# Patient Record
Sex: Female | Born: 1965
Health system: Southern US, Community
[De-identification: ages and names within clinical notes are randomized; demographics above are authoritative.]

## PROBLEM LIST (undated history)

## (undated) DIAGNOSIS — Z923 Personal history of irradiation: Secondary | ICD-10-CM

## (undated) DIAGNOSIS — N63 Unspecified lump in unspecified breast: Secondary | ICD-10-CM

## (undated) DIAGNOSIS — N390 Urinary tract infection, site not specified: Secondary | ICD-10-CM

## (undated) DIAGNOSIS — R519 Headache, unspecified: Secondary | ICD-10-CM

## (undated) DIAGNOSIS — R51 Headache: Secondary | ICD-10-CM

## (undated) DIAGNOSIS — D259 Leiomyoma of uterus, unspecified: Secondary | ICD-10-CM

## (undated) DIAGNOSIS — G8929 Other chronic pain: Secondary | ICD-10-CM

## (undated) DIAGNOSIS — Q631 Lobulated, fused and horseshoe kidney: Secondary | ICD-10-CM

## (undated) HISTORY — DX: Malignant neoplasm of upper-inner quadrant of left female breast: C50.212

## (undated) HISTORY — PX: COLONOSCOPY: SHX174

## (undated) HISTORY — PX: APPENDECTOMY: SHX54

## (undated) HISTORY — DX: Estrogen receptor positive status (ER+): Z17.0

---

## 2003-11-14 HISTORY — PX: APPENDECTOMY: SHX54

## 2013-08-02 ENCOUNTER — Ambulatory Visit (INDEPENDENT_AMBULATORY_CARE_PROVIDER_SITE_OTHER): Payer: Managed Care, Other (non HMO) | Admitting: Family Medicine

## 2013-08-02 VITALS — BP 92/58 | HR 77 | Temp 97.9°F | Resp 16 | Ht 63.0 in | Wt 130.0 lb

## 2013-08-02 DIAGNOSIS — R300 Dysuria: Secondary | ICD-10-CM

## 2013-08-02 DIAGNOSIS — R339 Retention of urine, unspecified: Secondary | ICD-10-CM

## 2013-08-02 LAB — POCT UA - MICROSCOPIC ONLY
Bacteria, U Microscopic: NEGATIVE
Casts, Ur, LPF, POC: NEGATIVE
Crystals, Ur, HPF, POC: NEGATIVE
Mucus, UA: NEGATIVE
RBC, urine, microscopic: NEGATIVE
Yeast, UA: NEGATIVE

## 2013-08-02 LAB — POCT URINALYSIS DIPSTICK
Bilirubin, UA: NEGATIVE
Glucose, UA: NEGATIVE
Ketones, UA: NEGATIVE
Leukocytes, UA: NEGATIVE
Nitrite, UA: NEGATIVE
Protein, UA: NEGATIVE
Spec Grav, UA: 1.02
Urobilinogen, UA: 0.2
pH, UA: 5.5

## 2013-08-02 LAB — POCT URINE PREGNANCY: Preg Test, Ur: NEGATIVE

## 2013-08-02 MED ORDER — CIPROFLOXACIN HCL 500 MG PO TABS: 500.0000 mg | ORAL_TABLET | Freq: Two times a day (BID) | ORAL | Status: DC

## 2013-08-02 NOTE — Progress Notes (Signed)
Patient ID: Kristina Nicholson MRN: [id number], DOB: [birth year], 47 y.o. Date of Encounter: 08/02/2013, 9:57 AM  Primary Physician: No PCP Per Patient  Chief Complaint:  Chief Complaint  Patient presents with  . Urinary Tract Infection    about 1 week- urinary retention    HPI: 47 y.o. year old female presents with 5 day history of dysuria, urgency, and frequency. Last UTI was when she was young. No hematuria LMP:  First week this month No sick contacts, recent antibiotics, or recent travels.   No vaginal discharge, back pain, fever  No past medical history on file.   Home Meds: Prior to Admission medications   Not on File    Allergies: No Known Allergies  History   Social History  . Marital Status: Married    Spouse Name: N/A    Number of Children: N/A  . Years of Education: N/A   Occupational History  . Not on file.   Social History Main Topics  . Smoking status: Current Every Day Smoker  . Smokeless tobacco: Not on file  . Alcohol Use: No  . Drug Use: No  . Sexual Activity: Not on file   Other Topics Concern  . Not on file   Social History Narrative  . No narrative on file     Review of Systems: Constitutional: negative for chills, fever, night sweats or weight changes Cardiovascular: negative for chest pain or palpitations Respiratory: negative for hemoptysis, wheezing, or shortness of breath Abdominal: negative for abdominal pain, nausea, vomiting or diarrhea Dermatological: negative for rash Neurologic: negative for headache   Physical Exam: Blood pressure 92/58, pulse 77, temperature 97.9 F (36.6 C), temperature source Oral, resp. rate 16, height 5\' 3"  (1.6 m), weight 130 lb (58.968 kg), SpO2 99.00%., Body mass index is 23.03 kg/(m^2). General: Well developed, well nourished, in no acute distress. Head: Normocephalic, atraumatic, eyes without discharge, sclera non-icteric, nares are congested. Bilateral auditory canals clear, TM's are  without perforation, pearly grey with reflective cone of light bilaterally. Serous effusion bilaterally behind TM's. Maxillary sinus TTP. Oral cavity moist, dentition normal. Posterior pharynx with post nasal drip and mild erythema. No peritonsillar abscess or tonsillar exudate. Neck: Supple. No thyromegaly. Full ROM. No lymphadenopathy. Lungs: Coarse breath sounds bilaterally without Clear bilaterally to auscultation without wheezes, rales, or rhonchi. Breathing is unlabored.  Heart: RRR with S1 S2. No murmurs, rubs, or gallops appreciated. Abdomen: Soft, non-distended with normoactive bowel sounds. No hepatosplenomegaly. No rebound/guarding. No obvious abdominal masses.  Tender LLQ with deep palpation.   McBurney's, Rovsing's, Iliopsoas, and table jar all negative. Msk:  Strength and tone normal for age. Extremities: No clubbing or cyanosis. No edema. Neuro: Alert and oriented X 3. Moves all extremities spontaneously. CNII-XII grossly in tact. Psych:  Responds to questions appropriately with a normal affect.   Labs: Results for orders placed in visit on 08/02/13  POCT UA - MICROSCOPIC ONLY      Result Value Range   WBC, Ur, HPF, POC 0-2     RBC, urine, microscopic neg     Bacteria, U Microscopic neg     Mucus, UA neg     Epithelial cells, urine per micros 2-8     Crystals, Ur, HPF, POC neg     Casts, Ur, LPF, POC neg     Yeast, UA neg    POCT URINALYSIS DIPSTICK      Result Value Range   Color, UA yellow     Clarity, UA clear  Glucose, UA neg     Bilirubin, UA neg     Ketones, UA neg     Spec Grav, UA 1.020     Blood, UA trace     pH, UA 5.5     Protein, UA neg     Urobilinogen, UA 0.2     Nitrite, UA neg     Leukocytes, UA Negative    POCT URINE PREGNANCY      Result Value Range   Preg Test, Ur Negative         ASSESSMENT AND PLAN:  47 y.o. year old female with  - -Mucinex -Tylenol/Motrin prn -Rest/fluids -RTC precautions -RTC 3-5 days if no  improvement  Signed, Elvina Sidle, MD 08/02/2013 9:57 AM

## 2013-08-03 LAB — URINE CULTURE: Colony Count: 1000

## 2013-10-11 ENCOUNTER — Ambulatory Visit (INDEPENDENT_AMBULATORY_CARE_PROVIDER_SITE_OTHER): Payer: Managed Care, Other (non HMO) | Admitting: Family Medicine

## 2013-10-11 VITALS — BP 110/66 | HR 70 | Temp 98.6°F | Resp 18 | Ht 63.0 in | Wt 129.0 lb

## 2013-10-11 DIAGNOSIS — N925 Other specified irregular menstruation: Secondary | ICD-10-CM

## 2013-10-11 DIAGNOSIS — N938 Other specified abnormal uterine and vaginal bleeding: Secondary | ICD-10-CM

## 2013-10-11 DIAGNOSIS — N949 Unspecified condition associated with female genital organs and menstrual cycle: Secondary | ICD-10-CM

## 2013-10-11 DIAGNOSIS — N926 Irregular menstruation, unspecified: Secondary | ICD-10-CM

## 2013-10-11 DIAGNOSIS — N920 Excessive and frequent menstruation with regular cycle: Secondary | ICD-10-CM

## 2013-10-11 LAB — POCT CBC
Granulocyte percent: 44.3 %G (ref 37–80)
HCT, POC: 43.5 % (ref 37.7–47.9)
Hemoglobin: 13.4 g/dL (ref 12.2–16.2)
MCHC: 30.8 g/dL — AB (ref 31.8–35.4)
POC Granulocyte: 2.1 (ref 2–6.9)
POC LYMPH PERCENT: 50.4 %L — AB (ref 10–50)
POC MID %: 5.3 %M (ref 0–12)
RDW, POC: 13.6 %

## 2013-10-11 LAB — POCT URINE PREGNANCY: Preg Test, Ur: NEGATIVE

## 2013-10-11 MED ORDER — MEDROXYPROGESTERONE ACETATE 10 MG PO TABS: 10.0000 mg | ORAL_TABLET | Freq: Every day | ORAL | Status: DC

## 2013-10-11 NOTE — Addendum Note (Signed)
Addended by: Vira Agar on: 10/11/2013 03:53 PM   Modules accepted: Orders

## 2013-10-11 NOTE — Progress Notes (Addendum)
Subjective: Patient has been having vaginal bleeding for about a month. She's had normal periods prior to that. This is light to moderate to heavy at times. No major pain. She has not had problems with her periods in the past. She had a normal period, stop for 2 days, then this prolonged bleeding started.  Objective: No CVA tenderness. Abdomen soft without mass or tenderness. Normal external genitalia. Vaginal because unremarkable. Cervix benign. Bimanual exam feels no adnexal or uterine masses or tenderness. There is old blood in the vaginal vault and little blood coming from the os.  Assessment: Menorrhagia and prolonged menstrual bleeding  Plan: CBC, urine hCG Treat with Provera for 5 days. If symptoms continue to persist we'll need to send to see a GYN and consider a ultrasound.

## 2013-10-11 NOTE — Addendum Note (Signed)
Addended by: Johnnette Litter on: 10/11/2013 03:55 PM   Modules accepted: Orders

## 2013-10-11 NOTE — Patient Instructions (Signed)
Take the progesterone (Provera) 10 mg one daily for 5 days.  I hope this stops your bleeding. At the end of the course of Provera you will probably have a normal but maybe heavy menstrual periods. After that you should cease bleeding and go back into your regular monthly cycling. If bleeding gets extremely heavy or continues to persist you should be rechecked and further evaluation and referral discussed. He might need an ultrasound and a referral to a gynecologist

## 2014-06-23 ENCOUNTER — Ambulatory Visit (INDEPENDENT_AMBULATORY_CARE_PROVIDER_SITE_OTHER): Payer: BC Managed Care – PPO | Admitting: Internal Medicine

## 2014-06-23 VITALS — BP 120/78 | HR 59 | Temp 97.7°F | Resp 18 | Ht 63.0 in | Wt 127.4 lb

## 2014-06-23 DIAGNOSIS — H60399 Other infective otitis externa, unspecified ear: Secondary | ICD-10-CM

## 2014-06-23 DIAGNOSIS — R109 Unspecified abdominal pain: Secondary | ICD-10-CM

## 2014-06-23 DIAGNOSIS — Q638 Other specified congenital malformations of kidney: Secondary | ICD-10-CM

## 2014-06-23 DIAGNOSIS — Q631 Lobulated, fused and horseshoe kidney: Secondary | ICD-10-CM

## 2014-06-23 LAB — POCT URINALYSIS DIPSTICK
Bilirubin, UA: NEGATIVE
Glucose, UA: NEGATIVE
Nitrite, UA: NEGATIVE
Protein, UA: NEGATIVE
Spec Grav, UA: 1.025
Urobilinogen, UA: 0.2
pH, UA: 5.5

## 2014-06-23 LAB — POCT UA - MICROSCOPIC ONLY
CRYSTALS, UR, HPF, POC: NEGATIVE
Casts, Ur, LPF, POC: NEGATIVE
Yeast, UA: NEGATIVE

## 2014-06-23 MED ORDER — NEOMYCIN-POLYMYXIN-HC 3.5-10000-1 OT SUSP: 3.0000 [drp] | Freq: Three times a day (TID) | OTIC | Status: DC

## 2014-06-23 MED ORDER — CIPROFLOXACIN HCL 500 MG PO TABS: 500.0000 mg | ORAL_TABLET | Freq: Two times a day (BID) | ORAL | Status: DC

## 2014-06-23 MED ORDER — MELOXICAM 15 MG PO TABS: 15.0000 mg | ORAL_TABLET | Freq: Every day | ORAL | Status: DC

## 2014-06-23 NOTE — Progress Notes (Signed)
Subjective:    Patient ID: Kristina Nicholson, female    DOB: 05/10/1966, 48 y.o.   MRN: [id number] This chart was scribed for Tami Lin, MD by Randa Evens, ED Scribe. This Patient was seen in room 04 and the patients care was started at 5:55 PM  Flank Pain Associated symptoms include dysuria. Pertinent negatives include no fever.  Otalgia  Pertinent negatives include no vomiting.   HPI Comments: Kristina Nicholson is a 48 y.o. female who presents to the [facility] complaining of intermittent left sided flank pain onset 3 months. States she has associated decreased urine and dysuria.  State she was in [place] during the winter when these problems began and had an CT scan revealing a horseshoe kidney with swelling. States her kidney was filled with fluid. She was treated with [place] herbal preparations and responded but read as medicine about 3 months ago after returning home. Denies fever, vomiting or nausea. Has urinary frequency with slight discomfort suprapubic postvoid.  Also complaining of left sided ear pain onset 3 days. She states that her ear feels dryer than normal. No change in hearing  There are no active problems to display for this patient.  Prior to Admission medications   Not on File    Review of Systems  Constitutional: Negative for fever.  HENT: Positive for ear pain.   Gastrointestinal: Negative for nausea and vomiting.  Genitourinary: Positive for dysuria, flank pain and decreased urine volume.    Objective:    Physical Exam  Nursing note and vitals reviewed. Constitutional: She is oriented to person, place, and time. She appears well-developed and well-nourished. No distress.  HENT:  Head: Normocephalic and atraumatic.  Left ear auditory canal has dried flaky skin throughout, right auditory canal appears in similar fashion  Eyes: Conjunctivae and EOM are normal.  Neck: Neck supple. No tracheal deviation present.  Cardiovascular:  Normal rate.   Pulmonary/Chest: Effort normal. No respiratory distress.  Abdominal:  mildly tender on let side in poster axillary line without obvious mass, no flank tenderness to percussion, rest of abdomen is benign   Musculoskeletal: Normal range of motion.  Neurological: She is alert and oriented to person, place, and time.  Skin: Skin is warm and dry.  Psychiatric: She has a normal mood and affect. Her behavior is normal.   Results for orders placed in visit on 06/23/14  POCT URINALYSIS DIPSTICK      Result Value Ref Range   Color, UA yellow     Clarity, UA hazy     Glucose, UA neg     Bilirubin, UA neg     Ketones, UA trace     Spec Grav, UA 1.025     Blood, UA trace     pH, UA 5.5     Protein, UA neg     Urobilinogen, UA 0.2     Nitrite, UA neg     Leukocytes, UA Trace    POCT UA - MICROSCOPIC ONLY      Result Value Ref Range   WBC, Ur, HPF, POC 4-6     RBC, urine, microscopic 1-3     Bacteria, U Microscopic trace     Mucus, UA trace     Epithelial cells, urine per micros 4-6     Crystals, Ur, HPF, POC neg     Casts, Ur, LPF, POC neg     Yeast, UA neg      Assessment & Plan:  Flank pain ?  UTI Hydronephrosis likely-she needs followup with urology/ reimaging Otitis externa bilaterally  Meds ordered this encounter  Medications  . ciprofloxacin (CIPRO) 500 MG tablet    Sig: Take 1 tablet (500 mg total) by mouth 2 (two) times daily.    Dispense:  20 tablet    Refill:  0  . neomycin-polymyxin-hydrocortisone (CORTISPORIN) 3.5-10000-1 otic suspension    Sig: Place 3 drops into both ears 3 (three) times daily.    Dispense:  10 mL    Refill:  0  . meloxicam (MOBIC) 15 MG tablet    Sig: Take 1 tablet (15 mg total) by mouth daily. For pain    Dispense:  30 tablet    Refill:  0     I have completed the patient encounter in its entirety as documented by the scribe, with editing by me where necessary. Robert P. Laney Pastor, M.D.

## 2014-06-25 LAB — URINE CULTURE: Colony Count: 50000

## 2014-06-25 NOTE — Progress Notes (Signed)
Left a message for patient to return call to schedule an appointment in 3 to 4 months for a CPE with a female provider.

## 2014-06-26 NOTE — Progress Notes (Signed)
Left a message for patient to return call to schedule an appointment for a cpe

## 2014-07-03 NOTE — Progress Notes (Signed)
Left a message for patient to return call for a physical.

## 2014-07-06 ENCOUNTER — Telehealth: Payer: Self-pay

## 2014-07-06 ENCOUNTER — Encounter: Payer: Self-pay | Admitting: Family Medicine

## 2014-07-06 NOTE — Telephone Encounter (Signed)
Patient is returning call to [facility]  °

## 2014-07-06 NOTE — Progress Notes (Signed)
Sent an unable to reach letter.

## 2014-07-07 NOTE — Telephone Encounter (Signed)
No documentation for call to pt.

## 2014-07-07 NOTE — Telephone Encounter (Signed)
Patient needs an appointment letter was sent, tried again, unable to reach

## 2014-07-22 ENCOUNTER — Other Ambulatory Visit (HOSPITAL_COMMUNITY): Payer: Self-pay | Admitting: Urology

## 2014-07-22 DIAGNOSIS — R1900 Intra-abdominal and pelvic swelling, mass and lump, unspecified site: Secondary | ICD-10-CM

## 2014-07-31 ENCOUNTER — Ambulatory Visit (HOSPITAL_COMMUNITY)
Admission: RE | Admit: 2014-07-31 | Discharge: 2014-07-31 | Disposition: A | Discharge status: Home / Self Care | Payer: BC Managed Care – PPO | Source: Ambulatory Visit | Attending: Urology | Admitting: Urology

## 2014-07-31 DIAGNOSIS — R1909 Other intra-abdominal and pelvic swelling, mass and lump: Secondary | ICD-10-CM | POA: Diagnosis present

## 2014-07-31 DIAGNOSIS — D259 Leiomyoma of uterus, unspecified: Secondary | ICD-10-CM | POA: Insufficient documentation

## 2014-07-31 DIAGNOSIS — R1900 Intra-abdominal and pelvic swelling, mass and lump, unspecified site: Secondary | ICD-10-CM

## 2014-07-31 MED ORDER — GADOBENATE DIMEGLUMINE 529 MG/ML IV SOLN
15.0000 mL | Freq: Once | INTRAVENOUS | Status: AC | PRN
  Administered 2014-07-31: 12 mL via INTRAVENOUS

## 2014-11-13 HISTORY — PX: COLONOSCOPY: SHX174

## 2015-03-08 ENCOUNTER — Other Ambulatory Visit: Payer: Self-pay | Admitting: Obstetrics & Gynecology

## 2015-03-08 DIAGNOSIS — R928 Other abnormal and inconclusive findings on diagnostic imaging of breast: Secondary | ICD-10-CM

## 2015-07-26 ENCOUNTER — Ambulatory Visit (INDEPENDENT_AMBULATORY_CARE_PROVIDER_SITE_OTHER): Payer: BLUE CROSS/BLUE SHIELD | Admitting: Urgent Care

## 2015-07-26 VITALS — BP 110/60 | HR 60 | Temp 98.1°F | Resp 16 | Ht 63.0 in | Wt 126.0 lb

## 2015-07-26 DIAGNOSIS — R519 Headache, unspecified: Secondary | ICD-10-CM

## 2015-07-26 DIAGNOSIS — R5383 Other fatigue: Secondary | ICD-10-CM | POA: Diagnosis not present

## 2015-07-26 DIAGNOSIS — R42 Dizziness and giddiness: Secondary | ICD-10-CM

## 2015-07-26 DIAGNOSIS — R109 Unspecified abdominal pain: Secondary | ICD-10-CM | POA: Diagnosis not present

## 2015-07-26 DIAGNOSIS — R110 Nausea: Secondary | ICD-10-CM

## 2015-07-26 DIAGNOSIS — R51 Headache: Secondary | ICD-10-CM | POA: Diagnosis not present

## 2015-07-26 LAB — COMPREHENSIVE METABOLIC PANEL
ALBUMIN: 3.7 g/dL (ref 3.6–5.1)
ALT: 8 U/L (ref 6–29)
AST: 12 U/L (ref 10–35)
Alkaline Phosphatase: 45 U/L (ref 33–130)
BILIRUBIN TOTAL: 0.5 mg/dL (ref 0.2–1.2)
BUN: 13 mg/dL (ref 7–25)
CO2: 26 mmol/L (ref 20–31)
CREATININE: 0.65 mg/dL (ref 0.50–1.05)
Calcium: 8.8 mg/dL (ref 8.6–10.4)
Chloride: 104 mmol/L (ref 98–110)
GLUCOSE: 94 mg/dL (ref 65–99)
Potassium: 3.9 mmol/L (ref 3.5–5.3)
SODIUM: 139 mmol/L (ref 135–146)
Total Protein: 6.2 g/dL (ref 6.1–8.1)

## 2015-07-26 LAB — POCT UA - MICROSCOPIC ONLY
CASTS, UR, LPF, POC: NEGATIVE
Crystals, Ur, HPF, POC: NEGATIVE
Mucus, UA: NEGATIVE
WBC, UR, HPF, POC: NEGATIVE
Yeast, UA: NEGATIVE

## 2015-07-26 LAB — POCT CBC
Granulocyte percent: 62.6 %G (ref 37–80)
HCT, POC: 36.4 % — AB (ref 37.7–47.9)
HEMOGLOBIN: 11.7 g/dL — AB (ref 12.2–16.2)
Lymph, poc: 2 (ref 0.6–3.4)
MCH: 28.9 pg (ref 27–31.2)
MCHC: 32.1 g/dL (ref 31.8–35.4)
MCV: 90 fL (ref 80–97)
MID (cbc): 0.5 (ref 0–0.9)
MPV: 7.1 fL (ref 0–99.8)
PLATELET COUNT, POC: 254 10*3/uL (ref 142–424)
POC Granulocyte: 4.2 (ref 2–6.9)
POC LYMPH PERCENT: 30.2 %L (ref 10–50)
POC MID %: 7.2 % (ref 0–12)
RBC: 4.04 M/uL (ref 4.04–5.48)
RDW, POC: 13.7 %
WBC: 6.7 10*3/uL (ref 4.6–10.2)

## 2015-07-26 LAB — POCT URINALYSIS DIPSTICK
Bilirubin, UA: NEGATIVE
GLUCOSE UA: NEGATIVE
Ketones, UA: NEGATIVE
Leukocytes, UA: NEGATIVE
NITRITE UA: NEGATIVE
Protein, UA: NEGATIVE
Spec Grav, UA: 1.015
UROBILINOGEN UA: 0.2
pH, UA: 5.5

## 2015-07-26 LAB — TSH: TSH: 0.871 u[IU]/mL (ref 0.350–4.500)

## 2015-07-26 NOTE — Progress Notes (Signed)
MRN: [id number] DOB: [birth year]  Subjective:   Kristina Nicholson is a 49 y.o. female presenting for chief complaint of Cough; Fatigue; and Dizziness  Reports 4 day history of fatigue, dizziness, intermittent dull headache, nausea. She has also had a 2 week history of cough which has improved but is still bothering her. Has tried NyQuil and Theraflu with relief for her cough. Denies fever, vomiting, abdominal pain, confusion, heart racing, palpitations, chest pain, shob, wheezing, sore throat, dysuria, hematuria, diarrhea. Denies the worst headache of her life. Of note, patient has a complicated urinary history, was seen by urology and gynecology, diagnosed with "horseshoe kidney". She has not been evaluated by a nephrologist. Patient works as a Programme researcher, broadcasting/film/video, she does not hydrate at all when she works her 8 hour shifts 3 days a week. Husband admits that she does not hydrate well in general. Denies any other aggravating or relieving factors, no other questions or concerns.  Kristina Nicholson currently has no medications in their medication list. Also has No Known Allergies.  Kristina Nicholson  has no past medical history on file. Also  has no past surgical history on file.  Objective:   Vitals: BP 110/60 mmHg  Pulse 60  Temp(Src) 98.1 F (36.7 C) (Oral)  Resp 16  Ht 5\' 3"  (1.6 m)  Wt 126 lb (57.153 kg)  BMI 22.33 kg/m2  SpO2 98%  LMP 06/22/2015  Physical Exam  Constitutional: She is oriented to person, place, and time. She appears well-developed and well-nourished.  HENT:  Mouth/Throat: Oropharynx is clear and moist.  TM's intact. Nasal passages patent. No sinus tenderness.  Eyes: No scleral icterus.  Neck: Normal range of motion. Neck supple.  Cardiovascular: Normal rate, regular rhythm and intact distal pulses.  Exam reveals no gallop and no friction rub.   No murmur heard. Pulmonary/Chest: No respiratory distress. She has no wheezes. She has no rales.  Abdominal: Soft. Bowel sounds are normal. She  exhibits no distension and no mass. There is no tenderness.  No CVA tenderness.  Lymphadenopathy:    She has no cervical adenopathy.  Neurological: She is alert and oriented to person, place, and time.  Skin: Skin is warm and dry. No rash noted. No erythema. No pallor.   Results for orders placed or performed in visit on 07/26/15 (from the past 24 hour(s))  POCT CBC     Status: Abnormal   Collection Time: 07/26/15  5:21 PM  Result Value Ref Range   WBC 6.7 4.6 - 10.2 K/uL   Lymph, poc 2.0 0.6 - 3.4   POC LYMPH PERCENT 30.2 10 - 50 %L   MID (cbc) 0.5 0 - 0.9   POC MID % 7.2 0 - 12 %M   POC Granulocyte 4.2 2 - 6.9   Granulocyte percent 62.6 37 - 80 %G   RBC 4.04 4.04 - 5.48 M/uL   Hemoglobin 11.7 (A) 12.2 - 16.2 g/dL   HCT, POC 36.4 (A) 37.7 - 47.9 %   MCV 90.0 80 - 97 fL   MCH, POC 28.9 27 - 31.2 pg   MCHC 32.1 31.8 - 35.4 g/dL   RDW, POC 13.7 %   Platelet Count, POC 254 142 - 424 K/uL   MPV 7.1 0 - 99.8 fL  POCT UA - Microscopic Only     Status: None   Collection Time: 07/26/15  5:21 PM  Result Value Ref Range   WBC, Ur, HPF, POC neg    RBC, urine, microscopic 0-1  Bacteria, U Microscopic trace    Mucus, UA neg    Epithelial cells, urine per micros 2-6    Crystals, Ur, HPF, POC neg    Casts, Ur, LPF, POC neg    Yeast, UA neg   POCT urinalysis dipstick     Status: None   Collection Time: 07/26/15  5:21 PM  Result Value Ref Range   Color, UA yellow    Clarity, UA clear    Glucose, UA neg    Bilirubin, UA neg    Ketones, UA neg    Spec Grav, UA 1.015    Blood, UA tr-lysed    pH, UA 5.5    Protein, UA neg    Urobilinogen, UA 0.2    Nitrite, UA neg    Leukocytes, UA Negative Negative   Assessment and Plan :   1. Dizziness 2. Nonintractable headache, unspecified chronicity pattern, unspecified headache type 3. Other fatigue 4. Nausea without vomiting 5. Left flank pain - POC labs and physical exam findings are reassuring. Recommended patient start hydrating  well especially during her long shifts at work, take the next day off for rest. Recommended supportive care otherwise. - I will follow up with her husband by phone, labs pending. - I did recommend Antivert for dizziness and nausea as needed.  Jaynee Eagles, PA-C [facility] [phone] 07/26/2015 4:48 PM

## 2015-07-26 NOTE — Patient Instructions (Signed)
- Hydration is very important, drink at least 64 ounces daily. This important especially when you work.  - If you start to get fever, abdominal pain, your headache worsens, you need to return to our clinic for re-evaluation.   Dizziness Dizziness is a common problem. It is a feeling of unsteadiness or light-headedness. You may feel like you are about to faint. Dizziness can lead to injury if you stumble or fall. A person of any age group can suffer from dizziness, but dizziness is more common in older adults. CAUSES  Dizziness can be caused by many different things, including:  Middle ear problems.  Standing for too long.  Infections.  An allergic reaction.  Aging.  An emotional response to something, such as the sight of blood.  Side effects of medicines.  Tiredness.  Problems with circulation or blood pressure.  Excessive use of alcohol or medicines, or illegal drug use.  Breathing too fast (hyperventilation).  An irregular heart rhythm (arrhythmia).  A low red blood cell count (anemia).  Pregnancy.  Vomiting, diarrhea, fever, or other illnesses that cause body fluid loss (dehydration).  Diseases or conditions such as Parkinson's disease, high blood pressure (hypertension), diabetes, and thyroid problems.  Exposure to extreme heat. DIAGNOSIS  Your health care provider will ask about your symptoms, perform a physical exam, and perform an electrocardiogram (ECG) to record the electrical activity of your heart. Your health care provider may also perform other heart or blood tests to determine the cause of your dizziness. These may include:  Transthoracic echocardiogram (TTE). During echocardiography, sound waves are used to evaluate how blood flows through your heart.  Transesophageal echocardiogram (TEE).  Cardiac monitoring. This allows your health care provider to monitor your heart rate and rhythm in real time.  Holter monitor. This is a portable device that  records your heartbeat and can help diagnose heart arrhythmias. It allows your health care provider to track your heart activity for several days if needed.  Stress tests by exercise or by giving medicine that makes the heart beat faster. TREATMENT  Treatment of dizziness depends on the cause of your symptoms and can vary greatly. HOME CARE INSTRUCTIONS   Drink enough fluids to keep your urine clear or pale yellow. This is especially important in very hot weather. In older adults, it is also important in cold weather.  Take your medicine exactly as directed if your dizziness is caused by medicines. When taking blood pressure medicines, it is especially important to get up slowly.  Rise slowly from chairs and steady yourself until you feel okay.  In the morning, first sit up on the side of the bed. When you feel okay, stand slowly while holding onto something until you know your balance is fine.  Move your legs often if you need to stand in one place for a long time. Tighten and relax your muscles in your legs while standing.  Have someone stay with you for 1-2 days if dizziness continues to be a problem. Do this until you feel you are well enough to stay alone. Have the person call your health care provider if he or she notices changes in you that are concerning.  Do not drive or use heavy machinery if you feel dizzy.  Do not drink alcohol. SEEK IMMEDIATE MEDICAL CARE IF:   Your dizziness or light-headedness gets worse.  You feel nauseous or vomit.  You have problems talking, walking, or using your arms, hands, or legs.  You feel weak.  You are not thinking clearly or you have trouble forming sentences. It may take a friend or family member to notice this.  You have chest pain, abdominal pain, shortness of breath, or sweating.  Your vision changes.  You notice any bleeding.  You have side effects from medicine that seems to be getting worse rather than better. MAKE SURE YOU:     Understand these instructions.  Will watch your condition.  Will get help right away if you are not doing well or get worse. Document Released: 04/25/2001 Document Revised: 11/04/2013 Document Reviewed: 05/19/2011 [facility] Patient Information 2015 [place], [place]. This information is not intended to replace advice given to you by your health care provider. Make sure you discuss any questions you have with your health care provider.

## 2015-07-27 ENCOUNTER — Telehealth: Payer: Self-pay | Admitting: Urgent Care

## 2015-07-27 NOTE — Telephone Encounter (Signed)
Reported CMet was normal including electrolytes, blood sugar, liver enzymes and kidney function. Also had normal TSH. Recommended following plan as discussed in clinic. RTC in 2 days if no improvement.

## 2016-11-13 HISTORY — PX: MASTECTOMY: SHX3

## 2017-04-04 DIAGNOSIS — N6320 Unspecified lump in the left breast, unspecified quadrant: Secondary | ICD-10-CM | POA: Diagnosis not present

## 2017-04-05 ENCOUNTER — Other Ambulatory Visit: Payer: Self-pay | Admitting: Obstetrics & Gynecology

## 2017-04-05 DIAGNOSIS — N6320 Unspecified lump in the left breast, unspecified quadrant: Secondary | ICD-10-CM

## 2017-04-10 ENCOUNTER — Other Ambulatory Visit: Payer: Self-pay | Admitting: Obstetrics & Gynecology

## 2017-04-10 ENCOUNTER — Ambulatory Visit
Admission: RE | Admit: 2017-04-10 | Discharge: 2017-04-10 | Disposition: A | Discharge status: Home / Self Care | Payer: BLUE CROSS/BLUE SHIELD | Source: Ambulatory Visit | Attending: Obstetrics & Gynecology | Admitting: Obstetrics & Gynecology

## 2017-04-10 DIAGNOSIS — N6489 Other specified disorders of breast: Secondary | ICD-10-CM | POA: Diagnosis not present

## 2017-04-10 DIAGNOSIS — N6320 Unspecified lump in the left breast, unspecified quadrant: Secondary | ICD-10-CM

## 2017-04-10 DIAGNOSIS — R928 Other abnormal and inconclusive findings on diagnostic imaging of breast: Secondary | ICD-10-CM | POA: Diagnosis not present

## 2017-04-10 HISTORY — DX: Unspecified lump in unspecified breast: N63.0

## 2017-04-13 ENCOUNTER — Ambulatory Visit
Admission: RE | Admit: 2017-04-13 | Discharge: 2017-04-13 | Disposition: A | Discharge status: Home / Self Care | Payer: BLUE CROSS/BLUE SHIELD | Source: Ambulatory Visit | Attending: Obstetrics & Gynecology | Admitting: Obstetrics & Gynecology

## 2017-04-13 DIAGNOSIS — N6320 Unspecified lump in the left breast, unspecified quadrant: Secondary | ICD-10-CM

## 2017-04-13 DIAGNOSIS — C50212 Malignant neoplasm of upper-inner quadrant of left female breast: Secondary | ICD-10-CM | POA: Diagnosis not present

## 2017-04-18 ENCOUNTER — Telehealth: Payer: Self-pay | Admitting: *Deleted

## 2017-04-18 ENCOUNTER — Encounter: Payer: Self-pay | Admitting: *Deleted

## 2017-04-18 DIAGNOSIS — C50212 Malignant neoplasm of upper-inner quadrant of left female breast: Secondary | ICD-10-CM

## 2017-04-18 DIAGNOSIS — Z170 Estrogen receptor positive status [ER+]: Principal | ICD-10-CM

## 2017-04-18 HISTORY — DX: Malignant neoplasm of upper-inner quadrant of left female breast: C50.212

## 2017-04-18 HISTORY — DX: Estrogen receptor positive status (ER+): Z17.0

## 2017-04-18 NOTE — Telephone Encounter (Signed)
Confirmed [facility] for 04/25/17 at 0815 .  Instructions and contact information given.

## 2017-04-18 NOTE — Telephone Encounter (Signed)
Mailed [facility] packet to pt. 

## 2017-04-18 NOTE — Telephone Encounter (Signed)
Left vm for pt to return call regarding [place] for 6.13.18. Contact information provided.

## 2017-04-25 ENCOUNTER — Encounter: Payer: Self-pay | Admitting: Hematology

## 2017-04-25 ENCOUNTER — Ambulatory Visit: Payer: BLUE CROSS/BLUE SHIELD | Attending: General Surgery | Admitting: Physical Therapy

## 2017-04-25 ENCOUNTER — Ambulatory Visit (HOSPITAL_BASED_OUTPATIENT_CLINIC_OR_DEPARTMENT_OTHER): Payer: BLUE CROSS/BLUE SHIELD | Admitting: Hematology

## 2017-04-25 ENCOUNTER — Ambulatory Visit: Payer: Self-pay | Admitting: General Surgery

## 2017-04-25 ENCOUNTER — Ambulatory Visit
Admission: RE | Admit: 2017-04-25 | Discharge: 2017-04-25 | Disposition: A | Discharge status: Home / Self Care | Payer: BLUE CROSS/BLUE SHIELD | Source: Ambulatory Visit | Attending: Radiation Oncology | Admitting: Radiation Oncology

## 2017-04-25 ENCOUNTER — Encounter: Payer: Self-pay | Admitting: *Deleted

## 2017-04-25 ENCOUNTER — Other Ambulatory Visit (HOSPITAL_BASED_OUTPATIENT_CLINIC_OR_DEPARTMENT_OTHER): Payer: BLUE CROSS/BLUE SHIELD

## 2017-04-25 ENCOUNTER — Encounter: Payer: Self-pay | Admitting: Physical Therapy

## 2017-04-25 VITALS — BP 112/67 | HR 70 | Temp 97.8°F | Resp 18 | Ht 63.0 in | Wt 129.6 lb

## 2017-04-25 DIAGNOSIS — C50212 Malignant neoplasm of upper-inner quadrant of left female breast: Secondary | ICD-10-CM

## 2017-04-25 DIAGNOSIS — Z170 Estrogen receptor positive status [ER+]: Secondary | ICD-10-CM

## 2017-04-25 DIAGNOSIS — Z9012 Acquired absence of left breast and nipple: Secondary | ICD-10-CM | POA: Insufficient documentation

## 2017-04-25 DIAGNOSIS — F17210 Nicotine dependence, cigarettes, uncomplicated: Secondary | ICD-10-CM | POA: Diagnosis not present

## 2017-04-25 DIAGNOSIS — Z716 Tobacco abuse counseling: Secondary | ICD-10-CM | POA: Diagnosis not present

## 2017-04-25 DIAGNOSIS — R300 Dysuria: Secondary | ICD-10-CM

## 2017-04-25 DIAGNOSIS — Z510 Encounter for antineoplastic radiation therapy: Secondary | ICD-10-CM | POA: Insufficient documentation

## 2017-04-25 LAB — COMPREHENSIVE METABOLIC PANEL
ALT: 11 U/L (ref 0–55)
AST: 15 U/L (ref 5–34)
Albumin: 3.9 g/dL (ref 3.5–5.0)
Alkaline Phosphatase: 77 U/L (ref 40–150)
Anion Gap: 11 mEq/L (ref 3–11)
BILIRUBIN TOTAL: 0.45 mg/dL (ref 0.20–1.20)
BUN: 12 mg/dL (ref 7.0–26.0)
CALCIUM: 9.8 mg/dL (ref 8.4–10.4)
CO2: 25 mEq/L (ref 22–29)
CREATININE: 0.8 mg/dL (ref 0.6–1.1)
Chloride: 106 mEq/L (ref 98–109)
EGFR: 85 mL/min/{1.73_m2} — ABNORMAL LOW (ref >90)
Glucose: 118 mg/dl (ref 70–140)
Potassium: 3.6 mEq/L (ref 3.5–5.1)
Sodium: 142 mEq/L (ref 136–145)
TOTAL PROTEIN: 7.4 g/dL (ref 6.4–8.3)

## 2017-04-25 LAB — CBC WITH DIFFERENTIAL/PLATELET
BASO%: 0.5 % (ref 0.0–2.0)
BASOS ABS: 0 10*3/uL (ref 0.0–0.1)
EOS%: 2 % (ref 0.0–7.0)
Eosinophils Absolute: 0.1 10*3/uL (ref 0.0–0.5)
HCT: 40.5 % (ref 34.8–46.6)
HGB: 13.7 g/dL (ref 11.6–15.9)
LYMPH%: 43.2 % (ref 14.0–49.7)
MCH: 30.6 pg (ref 25.1–34.0)
MCHC: 33.9 g/dL (ref 31.5–36.0)
MCV: 90.2 fL (ref 79.5–101.0)
MONO#: 0.4 10*3/uL (ref 0.1–0.9)
MONO%: 8.7 % (ref 0.0–14.0)
NEUT#: 2.3 10*3/uL (ref 1.5–6.5)
NEUT%: 45.6 % (ref 38.4–76.8)
Platelets: 254 10*3/uL (ref 145–400)
RBC: 4.49 10*6/uL (ref 3.70–5.45)
RDW: 13.4 % (ref 11.2–14.5)
WBC: 5 10*3/uL (ref 3.9–10.3)
lymph#: 2.2 10*3/uL (ref 0.9–3.3)

## 2017-04-25 NOTE — Progress Notes (Signed)
Radiation Oncology         [id number]) [phone] ________________________________  Initial outpatient Consultation  Name: Kristina Nicholson MRN: [id number]  Date: 04/25/2017  DOB: [birth year]  BS:[id number], No Pcp Per  Jovita Kussmaul, MD   REFERRING PHYSICIAN: Autumn Messing III, MD  DIAGNOSIS:    ICD-10-CM   1. Malignant neoplasm of upper-inner quadrant of left breast in female, estrogen receptor positive ([place]) C50.212    Z17.0    Stage T2 N0 M0  Left Breast LIQ Invasive Ductal Carcinoma and DCIS, ER Positive / PR Negative / Her2 Negative, Grade 2 Cancer Staging Malignant neoplasm of upper-inner quadrant of left breast in female, estrogen receptor positive ([place]) Staging form: Breast, AJCC 8th Edition - Clinical stage from 04/13/2017: Stage IIA (cT2, cN0, cM0, G2, ER: Positive, PR: Negative, HER2: Negative) - Signed by [name], MD on 04/25/2017    CHIEF COMPLAINT: Here to discuss management of left breast cancer  HISTORY OF PRESENT ILLNESS::Kristina Nicholson is a 51 y.o. female who presented with palpable left breast mass in the 9 o'clock position, measuring 2.4 cm on ultrasound. Axilla negative on ultrasound. Left breast biopsy showed grade 2 invasive ductal carcinoma and DCIS  with characteristics as described above in the diagnosis.  The patient presents to the clinic today to discuss the role that radiation therapy may play in the treatment of her disease. She is accompanied by her husband today.  The patient speaks Mandarin and some English, and was offered translation services. She denied these services today. Her husband does not speak Mandarin.  On review of systems, the patient reports previously experiencing difficulty urinating with some pain in the left side of her back, though this is not happening now. The patient does not have a PCP.  PREVIOUS RADIATION THERAPY: No  PAST MEDICAL HISTORY:  has a past medical history of Breast mass and Malignant neoplasm of upper-inner quadrant of  left breast in female, estrogen receptor positive ([place]) (04/18/2017).    PAST SURGICAL HISTORY: Past Surgical History:  Procedure Laterality Date  . APPENDECTOMY      FAMILY HISTORY: family history includes Colon cancer in her brother. She reports her brother had early onset colon cancer treated with radiation therapy.  SOCIAL HISTORY:  reports that she has been smoking Cigarettes.  She has a 8.00 pack-year smoking history. She has never used smokeless tobacco. She reports that she drinks alcohol. She reports that she does not use drugs. She does not have a PCP. She smokes 5 cigarettes daily. She works as a Educational psychologist. She reports occasional alcohol use. The patient is from [place], and has lived in the [place] for 12 years. She speaks Mandarin and some [place].  ALLERGIES: Patient has no known allergies.  MEDICATIONS:  Current Outpatient Prescriptions  Medication Sig Dispense Refill  . acetaminophen (TYLENOL) 500 MG tablet Take 500 mg by mouth daily.     No current facility-administered medications for this encounter.     REVIEW OF SYSTEMS: A 10+ POINT REVIEW OF SYSTEMS WAS OBTAINED including neurology, dermatology, psychiatry, cardiac, respiratory, lymph, extremities, GI, GU, Musculoskeletal, constitutional, breasts, reproductive, HEENT.  All pertinent positives are noted in the HPI.  All others are negative.   PHYSICAL EXAM:   Vitals with BMI 04/25/2017  Height 5' 3"   Weight 129 lbs 10 oz  BMI 23  Systolic 638  Diastolic 67  Pulse 70  Respirations 18  General: Alert and oriented, in no acute distress. HEENT: Head is normocephalic. Oropharynx and oral cavity  are clear. Neck: Neck is supple, no palpable cervical or supraclavicular lymphadenopathy. Heart: Regular in rate and rhythm with no murmurs. Chest: Clear to auscultation bilaterally. Abdomen: Soft, non tender, non distended. Extremities: No edema. She has a firm subcutaneous nodule measuring about 1.5 cm on the anterior  upper right thigh, which is mobile. Lymphatics: see Neck Exam Skin: No concerning lesions. Musculoskeletal: symmetric strength and muscle tone throughout. Neurologic: No obvious focalities. Speech is fluent. Coordination is intact. Psychiatric: Judgment and insight are intact. Affect is appropriate. Breasts: In the lower inner quadrant of the left breast she has firmness that extends over about 4 cm; some of this might be post biopsy change. No palpable masses in the left axilla. She has a 1 cm subcutaneous nodule in the left tricep close to her underarm. No palpable masses in the right breast or axilla.  ECOG = 0  LABORATORY DATA:  Lab Results  Component Value Date   WBC 5.0 04/25/2017   HGB 13.7 04/25/2017   HCT 40.5 04/25/2017   MCV 90.2 04/25/2017   PLT 254 04/25/2017   CMP     Component Value Date/Time   NA 142 04/25/2017 0817   K 3.6 04/25/2017 0817   CL 104 07/26/2015 1714   CO2 25 04/25/2017 0817   GLUCOSE 118 04/25/2017 0817   BUN 12.0 04/25/2017 0817   CREATININE 0.8 04/25/2017 0817   CALCIUM 9.8 04/25/2017 0817   PROT 7.4 04/25/2017 0817   ALBUMIN 3.9 04/25/2017 0817   AST 15 04/25/2017 0817   ALT 11 04/25/2017 0817   ALKPHOS 77 04/25/2017 0817   BILITOT 0.45 04/25/2017 0817         RADIOGRAPHY: US Breast Ltd Uni Left Inc Axilla  Result Date: 04/10/2017 CLINICAL DATA:  51 year old female with a palpable left breast mass. The patient states she has been feeling this area for a long time, possibly a few years. EXAM: 2D DIGITAL DIAGNOSTIC BILATERAL MAMMOGRAM WITH CAD AND ADJUNCT TOMO LEFT BREAST ULTRASOUND COMPARISON:  Prior screening mammogram dated 02/26/2017. ACR Breast Density Category c: The breast tissue is heterogeneously dense, which may obscure small masses. FINDINGS: No suspicious masses or calcifications are seen in the right breast. There is an irregular mass with spiculated margins in the periareolar/slightly inner left breast measuring approximately 2.6  cm. There is associated flattening/retraction of the nipple-areola complex. Mammographic images were processed with CAD. Physical examination reveals a hard fixed mass visibly bulging the contour of the inner left breast. Targeted ultrasound of the left breast was performed demonstrating an irregular hypoechoic shadowing mass at 9 o'clock 1 cm from the nipple measuring 2.3 x 1 x 2.4 cm. This corresponds well with the mass seen at mammography. No lymphadenopathy seen in the left axilla. IMPRESSION: Highly suspicious palpable left breast mass. RECOMMENDATION: Ultrasound-guided biopsy of the mass in the left breast is recommended. This is scheduled for Friday 04/13/2017 at 7:30 a.m. I have discussed the findings and recommendations with the patient. Results were also provided in writing at the conclusion of the visit. If applicable, a reminder letter will be sent to the patient regarding the next appointment. BI-RADS CATEGORY  5: Highly suggestive of malignancy. Electronically Signed   By: Everlean Alstrom M.D.   On: 04/10/2017 14:41   Mm Diag Breast Tomo Bilateral  Result Date: 04/10/2017 CLINICAL DATA:  51 year old female with a palpable left breast mass. The patient states she has been feeling this area for a long time, possibly a few years. EXAM: 2D DIGITAL  DIAGNOSTIC BILATERAL MAMMOGRAM WITH CAD AND ADJUNCT TOMO LEFT BREAST ULTRASOUND COMPARISON:  Prior screening mammogram dated 02/26/2017. ACR Breast Density Category c: The breast tissue is heterogeneously dense, which may obscure small masses. FINDINGS: No suspicious masses or calcifications are seen in the right breast. There is an irregular mass with spiculated margins in the periareolar/slightly inner left breast measuring approximately 2.6 cm. There is associated flattening/retraction of the nipple-areola complex. Mammographic images were processed with CAD. Physical examination reveals a hard fixed mass visibly bulging the contour of the inner left  breast. Targeted ultrasound of the left breast was performed demonstrating an irregular hypoechoic shadowing mass at 9 o'clock 1 cm from the nipple measuring 2.3 x 1 x 2.4 cm. This corresponds well with the mass seen at mammography. No lymphadenopathy seen in the left axilla. IMPRESSION: Highly suspicious palpable left breast mass. RECOMMENDATION: Ultrasound-guided biopsy of the mass in the left breast is recommended. This is scheduled for Friday 04/13/2017 at 7:30 a.m. I have discussed the findings and recommendations with the patient. Results were also provided in writing at the conclusion of the visit. If applicable, a reminder letter will be sent to the patient regarding the next appointment. BI-RADS CATEGORY  5: Highly suggestive of malignancy. Electronically Signed   By: Everlean Alstrom M.D.   On: 04/10/2017 14:41   Mm Clip Placement Left  Result Date: 04/13/2017 CLINICAL DATA:  Status post ultrasound-guided left breast biopsy. EXAM: DIAGNOSTIC LEFT MAMMOGRAM POST ULTRASOUND BIOPSY COMPARISON:  Previous exam(s). FINDINGS: Mammographic images were obtained following ultrasound guided biopsy of the left breast mass at the 9:30 o'clock axis. Conclusion of the procedure, a ribbon shaped tissue marker was placed at the biopsy site. Biopsy clip is well-positioned at the superior-lateral margin of the mass. IMPRESSION: Ribbon shaped biopsy clip well-positioned at the superior-lateral margin of the left breast mass. Final Assessment: Post Procedure Mammograms for Marker Placement Electronically Signed   By: Franki Cabot M.D.   On: 04/13/2017 08:55   [place] Lt Breast Bx W Loc Dev 1st Lesion Img Bx Spec US Guide  Addendum Date: 04/16/2017   ADDENDUM REPORT: 04/16/2017 14:49 ADDENDUM: Pathology revealed GRADE II INVASIVE DUCTAL CARCINOMA, DUCTAL CARCINOMA IN SITU of the Left breast, 9:30 o'clock. This was found to be concordant by Dr. Franki Cabot. Pathology results were discussed with the patient's husband, Kristina Nicholson by telephone, per patient request. The patient's husband reported his wife did well after the biopsy with tenderness at the site. Post biopsy instructions and care were reviewed and questions were answered. The patient's husband was encouraged to call [facility] for any additional concerns. The patient was referred to [facility] on April 25, 2017. Pathology results reported by Terie Purser, RN on 04/16/2017. Electronically Signed   By: Franki Cabot M.D.   On: 04/16/2017 14:49   Result Date: 04/16/2017 CLINICAL DATA:  Patient with left breast mass presents today for ultrasound-guided biopsy. EXAM: ULTRASOUND GUIDED LEFT BREAST CORE NEEDLE BIOPSY COMPARISON:  Previous exam(s). PROCEDURE: I met with the patient and we discussed the procedure of ultrasound-guided biopsy, including benefits and alternatives. We discussed the high likelihood of a successful procedure. We discussed the risks of the procedure including infection, bleeding, tissue injury, clip migration, and inadequate sampling. Informed written consent was given. The usual time-out protocol was performed immediately prior to the procedure. Lesion quadrant: Upper inner quadrant Using sterile technique and 1% Lidocaine as local  anesthetic, under direct ultrasound visualization, a 12 gauge spring-loaded device was used to perform biopsy of the left breast mass at the 9:30 o'clock axisusing a inferior approach. At the conclusion of the procedure, a ribbon shaped tissue marker clip was deployed into the biopsy cavity. Follow-up 2-view mammogram was performed and dictated separately. IMPRESSION: Ultrasound-guided biopsy of the left breast mass at the 9:30 o'clock axis. No apparent complications. Electronically Signed: By: Franki Cabot M.D. On: 04/13/2017 08:53      IMPRESSION/PLAN: Left breast cancer.   She seems to want lumpectomy if possible.   Tumor is large/breast is small. I asked Dr. Burr Medico if neoadjuvant therapy might facilitate breast conservation.  An oncotype test from her biopsy could answer if chemotherapy is recommended to prevent distant disease.  If she would need chemotherapy regardless, Dr. Burr Medico will consider giving it preoperatively for better cosmesis.  It was a pleasure meeting the patient today. We discussed the risks, benefits, and side effects of radiotherapy. I recommend radiotherapy to the left breast after lumpectomy to reduce her risk of locoregional recurrence by 2/3. It could also be needed after a mastectomy, if margins or nodes are positive.  We discussed that radiation would take approximately 4 weeks to complete and that I would give the patient a few weeks to heal following surgery before starting treatment planning. The patient understands that 6 weeks is a possibility (ie if nodes are positive). We spoke about acute effects including skin irritation and fatigue as well as much less common late effects including internal organ injury or irritation. We spoke about the latest technology that is used to minimize the risk of late effects for patients undergoing radiotherapy to the breast or chest wall. No guarantees of treatment were given. The patient is enthusiastic about proceeding with treatment. I look forward to participating in the patient's care.  I will await her referral back to me for postoperative follow-up and eventual CT simulation/treatment planning.  I asked the patient today about tobacco use. The patient uses tobacco.  I advised the patient to quit. Services were offered by me today including outpatient counseling and pharmacotherapy. I assessed for the willingness to attempt to quit and provided encouragement and demonstrated willingness to make referrals and/or prescriptions to help the patient attempt to quit. The patient has follow-up with the oncologic team to touch base on their tobacco use and /or  cessation efforts.  Over 3 minutes were spent on this issue. I advised her to try nicotine gum to aid in cessation. She  plans to quit by 05/02/17.   I asked Dr. Marlou Starks to inspect the mass on her right thigh - consider excisions if appropriate.  Dr. Burr Medico and I spoke about urinary sx- she will determine if urinary issues can be addressed in [facility] or not. Patient needs a PCP, ultimately.    __________________________________________   Eppie Gibson, MD  This document serves as a record of services personally performed by Eppie Gibson, MD. It was created on her behalf by Maryla Morrow, a trained medical scribe. The creation of this record is based on the scribe's personal observations and the provider's statements to them. This document has been checked and approved by the attending provider.

## 2017-04-25 NOTE — Progress Notes (Signed)
Nutrition Assessment  Reason for Assessment:  Pt seen in Breast Clinic  ASSESSMENT:   51 year old female with new diagnosis of breast cancer.  Past medical history reviewed.  Patient reports normal appetite.  Medications:  reviewed  Labs: reviewed  Anthropometrics:   Height: 63 inches Weight: 129 lb 9.6 oz BMI: 23   NUTRITION DIAGNOSIS: Food and nutrition related knowledge deficit related to new diagnosis of breast cancer as evidenced by no prior need for nutrition related information.  INTERVENTION:   Discussed and provided packet of information regarding nutritional tips for breast cancer patients.  Questions answered.  Teachback method used.  Contact information provided and patient knows to contact me with questions/concerns.    MONITORING, EVALUATION, and GOAL: Pt will consume a healthy plant based diet to maintain lean body mass throughout treatment.   Ryer Asato B. Zenia Resides, [place], [place] Registered Dietitian [phone] (pager)

## 2017-04-25 NOTE — Progress Notes (Signed)
[place]  [phone] Fax:([phone]) [place] Note   Patient Care Team: Patient, No Pcp Per as PCP - General ([place]) [name], MD as Consulting Physician (Hematology) Jovita Kussmaul, MD as Consulting Physician (General Surgery) Eppie Gibson, MD as Attending Physician (Radiation Oncology) 04/25/2017  CHIEF COMPLAINTS/PURPOSE OF CONSULTATION:  Recent diagnosis of Malignant neoplasm of upper-inner quadrant of left breast in female, estrogen receptor positive  Oncology History   Cancer Staging Malignant neoplasm of upper-inner quadrant of left breast in female, estrogen receptor positive ([place]) Staging form: Breast, AJCC 8th Edition - Clinical stage from 04/13/2017: Stage IIA (cT2, cN0, cM0, G2, ER: Positive, PR: Negative, HER2: Negative) - Signed by [name], MD on 04/25/2017       Malignant neoplasm of upper-inner quadrant of left breast in female, estrogen receptor positive ([place])   04/10/2017 Mammogram    Left breast mammo and US showed an irregular hypoechoic shadowing mass at 9 o'clock 1 cm from the nipple measuring 2.3 x 1 x 2.4 cm. This corresponds well with the mass seen at mammography. No lymphadenopathy seen in the left axilla.      04/18/2017 Initial Diagnosis    Malignant neoplasm of upper-inner quadrant of left breast in female, estrogen receptor positive ([place])     04/22/2017 Receptors her2    Estrogen Receptor: 95%, POSITIVE, STRONG STAINING INTENSITY Progesterone Receptor: 0%, NEGATIVE Proliferation Marker Ki67: 10% HER2 -      04/22/2017 Initial Biopsy    Breast, left, needle core biopsy, 9:30 o'clock - INVASIVE DUCTAL CARCINOMA, G2 - DUCTAL CARCINOMA IN SITU.        HISTORY OF PRESENTING ILLNESS: 04/25/17 Kristina Nicholson 51 y.o. female presents to the clinic today with her husband. Due to recent diagnosis of Malignant neoplasm of upper-inner quadrant of left breast in female, estrogen receptor positive.  She had a diagnostic mammogram and [place] on 04/10/17 that showed evidence of a mass in the left breast. Following was a biopsy that showed DCIS in the left breast.   In the past She reports to having right thigh nodule for over 15 years and nothing has changed. When she was seen by her gynecologist she was using vaginal cream to help with intercourse. She is no longer using cream. She reports to holding her urine as a child through the day had to be catheterized to help her urinate. This was her second mammogram, the first in 2016 showed an anomaly but she did not have a follow up screening. 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 She will spend 3 month in [place] every 2-3 years. Her son is 35 and she had him when she was 85.  Today she reports to feeling the lump herself for more than 30 years. Every time her period would start her breast would feel tight and after her cycle the lump would feel softer. She has not felt any growth of the lump. Her husband reports it showing through the skin. She deines any pain, nipple discharge. For over a year she has lower back pain and it worsens when she is sitting low for a while. She saw a doctor when she was in [place] and she was told her pain was related to her kidney. Her husband reports her having trouble urinating on and off for a year. However she has no PCP currently. Husband thinks she wants to get cancer completley  removed with mastectomy and get breast reconstruction.   GYN HISTORY  Menarchal: 13 LMP: May 2017 Contraceptive:  HRT: no  G2P1, she has one son who is in [place]    MEDICAL HISTORY:  Past Medical History:  Diagnosis Date  . Breast mass    lt breast mass ? doesnt know how long  . Malignant neoplasm of upper-inner quadrant of left breast in female, estrogen receptor positive ([place]) 04/18/2017    SURGICAL HISTORY: Past Surgical History:  Procedure Laterality Date  . APPENDECTOMY       SOCIAL HISTORY: Social History   Social History  . Marital status: Married    Spouse name: N/A  . Number of children: N/A  . Years of education: N/A   Occupational History  . Not on file.   Social History Main Topics  . Smoking status: Current Every Day Smoker    Packs/day: 0.25    Years: 32.00    Types: Cigarettes  . Smokeless tobacco: Never Used  . Alcohol use Yes     Comment: occas  . Drug use: No  . Sexual activity: Not on file   Other Topics Concern  . Not on file   Social History Narrative  . No narrative on file    FAMILY HISTORY: Family History  Problem Relation Age of Onset  . Colon cancer Brother     ALLERGIES:  has No Known Allergies.  MEDICATIONS:  Current Outpatient Prescriptions  Medication Sig Dispense Refill  . acetaminophen (TYLENOL) 500 MG tablet Take 500 mg by mouth daily.     No current facility-administered medications for this visit.     REVIEW OF SYSTEMS:   Constitutional: Denies fevers, chills or abnormal night sweats Eyes: Denies blurriness of vision, double vision or watery eyes Ears, nose, mouth, throat, and face: Denies mucositis or sore throat Respiratory: Denies cough, dyspnea or wheezes Cardiovascular: Denies palpitation, chest discomfort or lower extremity swelling Gastrointestinal:  Denies nausea, heartburn or change in bowel habits (+) nodule on LRQ possibly from appendectomy  Urinary: (+) trouble urinating  Skin: Denies abnormal skin rashes Lymphatics: Denies new lymphadenopathy or easy bruising Neurological:Denies numbness, tingling or new weaknesses MSK: (+) lower back/flank pain  Behavioral/Psych: Mood is stable, no new changes  All other systems were reviewed with the patient and are negative. Breast: (+) palpable mass of left breast and occasional pain  PHYSICAL EXAMINATION: ECOG PERFORMANCE STATUS: 0 - Asymptomatic  Vitals:   04/25/17 0903  BP: 112/67  Pulse: 70  Resp: 18  Temp: 97.8 F (36.6 C)    Filed Weights   04/25/17 0903  Weight: 129 lb 9.6 oz (58.8 kg)    GENERAL:alert, no distress and comfortable SKIN: skin color, texture, turgor are normal, no rashes or significant lesions (+)  EYES: normal, conjunctiva are pink and non-injected, sclera clear OROPHARYNX:no exudate, no erythema and lips, buccal mucosa, and tongue normal  NECK: supple, thyroid normal size, non-tender, without nodularity LYMPH:  no palpable lymphadenopathy in the cervical, axillary or inguinal LUNGS: clear to auscultation and percussion with normal breathing effort HEART: regular rate & rhythm and no murmurs and no lower extremity edema ABDOMEN:abdomen soft, non-tender and normal bowel sounds  Extremity: (+) subcutaneous nodule right front thigh Musculoskeletal:no cyanosis of digits and no clubbing  PSYCH: alert & oriented x 3 with fluent speech NEURO: no focal motor/sensory deficits Breasts: Breast inspection showed them to be symmetrical with no nipple discharge. Palpation of the breasts and axilla revealed no obvious mass that I  could appreciate except a 2.5 x  3 cm palpable mass on the UIQ of left breast    LABORATORY DATA:  I have reviewed the data as listed CBC Latest Ref Rng & Units 04/25/2017 07/26/2015 10/11/2013  WBC 3.9 - 10.3 10e3/uL 5.0 6.7 4.8  Hemoglobin 11.6 - 15.9 g/dL 13.7 11.7(A) 13.4  Hematocrit 34.8 - 46.6 % 40.5 36.4(A) 43.5  Platelets 145 - 400 10e3/uL 254 - -    CMP Latest Ref Rng & Units 04/25/2017 07/26/2015  Glucose 70 - 140 mg/dl 118 94  BUN 7.0 - 26.0 mg/dL 12.0 13  Creatinine 0.6 - 1.1 mg/dL 0.8 0.65  Sodium 136 - 145 mEq/L 142 139  Potassium 3.5 - 5.1 mEq/L 3.6 3.9  Chloride 98 - 110 mmol/L - 104  CO2 22 - 29 mEq/L 25 26  Calcium 8.4 - 10.4 mg/dL 9.8 8.8  Total Protein 6.4 - 8.3 g/dL 7.4 6.2  Total Bilirubin 0.20 - 1.20 mg/dL 0.45 0.5  Alkaline Phos 40 - 150 U/L 77 45  AST 5 - 34 U/L 15 12  ALT 0 - 55 U/L 11 8   PATHOLOGY  Breast, left, needle core biopsy,  9:30 o'clock 04/22/17 - INVASIVE DUCTAL CARCINOMA, G2 - DUCTAL CARCINOMA IN SITU. Estrogen Receptor: 95%, POSITIVE, STRONG STAINING INTENSITY Progesterone Receptor: 0%, NEGATIVE Proliferation Marker Ki67: 10% HER2 -   RADIOGRAPHIC STUDIES: I have personally reviewed the radiological images as listed and agreed with the findings in the report. US Breast Ltd Uni Left Inc Axilla  Result Date: 04/10/2017 CLINICAL DATA:  51 year old female with a palpable left breast mass. The patient states she has been feeling this area for a long time, possibly a few years. EXAM: 2D DIGITAL DIAGNOSTIC BILATERAL MAMMOGRAM WITH CAD AND ADJUNCT TOMO LEFT BREAST ULTRASOUND COMPARISON:  Prior screening mammogram dated 02/26/2017. ACR Breast Density Category c: The breast tissue is heterogeneously dense, which may obscure small masses. FINDINGS: No suspicious masses or calcifications are seen in the right breast. There is an irregular mass with spiculated margins in the periareolar/slightly inner left breast measuring approximately 2.6 cm. There is associated flattening/retraction of the nipple-areola complex. Mammographic images were processed with CAD. Physical examination reveals a hard fixed mass visibly bulging the contour of the inner left breast. Targeted ultrasound of the left breast was performed demonstrating an irregular hypoechoic shadowing mass at 9 o'clock 1 cm from the nipple measuring 2.3 x 1 x 2.4 cm. This corresponds well with the mass seen at mammography. No lymphadenopathy seen in the left axilla. IMPRESSION: Highly suspicious palpable left breast mass. RECOMMENDATION: Ultrasound-guided biopsy of the mass in the left breast is recommended. This is scheduled for Friday 04/13/2017 at 7:30 a.m. I have discussed the findings and recommendations with the patient. Results were also provided in writing at the conclusion of the visit. If applicable, a reminder letter will be sent to the patient regarding the next  appointment. BI-RADS CATEGORY  5: Highly suggestive of malignancy. Electronically Signed   By: Everlean Alstrom M.D.   On: 04/10/2017 14:41   Mm Diag Breast Tomo Bilateral  Result Date: 04/10/2017 CLINICAL DATA:  51 year old female with a palpable left breast mass. The patient states she has been feeling this area for a long time, possibly a few years. EXAM: 2D DIGITAL DIAGNOSTIC BILATERAL MAMMOGRAM WITH CAD AND ADJUNCT TOMO LEFT BREAST ULTRASOUND COMPARISON:  Prior screening mammogram dated 02/26/2017. ACR Breast Density Category c: The breast tissue is heterogeneously dense, which may obscure small masses. FINDINGS:  No suspicious masses or calcifications are seen in the right breast. There is an irregular mass with spiculated margins in the periareolar/slightly inner left breast measuring approximately 2.6 cm. There is associated flattening/retraction of the nipple-areola complex. Mammographic images were processed with CAD. Physical examination reveals a hard fixed mass visibly bulging the contour of the inner left breast. Targeted ultrasound of the left breast was performed demonstrating an irregular hypoechoic shadowing mass at 9 o'clock 1 cm from the nipple measuring 2.3 x 1 x 2.4 cm. This corresponds well with the mass seen at mammography. No lymphadenopathy seen in the left axilla. IMPRESSION: Highly suspicious palpable left breast mass. RECOMMENDATION: Ultrasound-guided biopsy of the mass in the left breast is recommended. This is scheduled for Friday 04/13/2017 at 7:30 a.m. I have discussed the findings and recommendations with the patient. Results were also provided in writing at the conclusion of the visit. If applicable, a reminder letter will be sent to the patient regarding the next appointment. BI-RADS CATEGORY  5: Highly suggestive of malignancy. Electronically Signed   By: Everlean Alstrom M.D.   On: 04/10/2017 14:41   Mm Clip Placement Left  Result Date: 04/13/2017 CLINICAL DATA:  Status  post ultrasound-guided left breast biopsy. EXAM: DIAGNOSTIC LEFT MAMMOGRAM POST ULTRASOUND BIOPSY COMPARISON:  Previous exam(s). FINDINGS: Mammographic images were obtained following ultrasound guided biopsy of the left breast mass at the 9:30 o'clock axis. Conclusion of the procedure, a ribbon shaped tissue marker was placed at the biopsy site. Biopsy clip is well-positioned at the superior-lateral margin of the mass. IMPRESSION: Ribbon shaped biopsy clip well-positioned at the superior-lateral margin of the left breast mass. Final Assessment: Post Procedure Mammograms for Marker Placement Electronically Signed   By: Franki Cabot M.D.   On: 04/13/2017 08:55   [place] Lt Breast Bx W Loc Dev 1st Lesion Img Bx Spec US Guide  Addendum Date: 04/16/2017   ADDENDUM REPORT: 04/16/2017 14:49 ADDENDUM: Pathology revealed GRADE II INVASIVE DUCTAL CARCINOMA, DUCTAL CARCINOMA IN SITU of the Left breast, 9:30 o'clock. This was found to be concordant by Dr. Franki Cabot. Pathology results were discussed with the patient's husband, Mirriam Vadala by telephone, per patient request. The patient's husband reported his wife did well after the biopsy with tenderness at the site. Post biopsy instructions and care were reviewed and questions were answered. The patient's husband was encouraged to call [facility] for any additional concerns. The patient was referred to [facility] on April 25, 2017. Pathology results reported by Terie Purser, RN on 04/16/2017. Electronically Signed   By: Franki Cabot M.D.   On: 04/16/2017 14:49   Result Date: 04/16/2017 CLINICAL DATA:  Patient with left breast mass presents today for ultrasound-guided biopsy. EXAM: ULTRASOUND GUIDED LEFT BREAST CORE NEEDLE BIOPSY COMPARISON:  Previous exam(s). PROCEDURE: I met with the patient and we discussed the procedure of ultrasound-guided biopsy, including  benefits and alternatives. We discussed the high likelihood of a successful procedure. We discussed the risks of the procedure including infection, bleeding, tissue injury, clip migration, and inadequate sampling. Informed written consent was given. The usual time-out protocol was performed immediately prior to the procedure. Lesion quadrant: Upper inner quadrant Using sterile technique and 1% Lidocaine as local anesthetic, under direct ultrasound visualization, a 12 gauge spring-loaded device was used to perform biopsy of the left breast mass at the 9:30 o'clock axisusing a inferior approach. At the conclusion of the procedure, a  ribbon shaped tissue marker clip was deployed into the biopsy cavity. Follow-up 2-view mammogram was performed and dictated separately. IMPRESSION: Ultrasound-guided biopsy of the left breast mass at the 9:30 o'clock axis. No apparent complications. Electronically Signed: By: Franki Cabot M.D. On: 04/13/2017 08:53    ASSESSMENT & PLAN:  Nanci Lakatos is a 51 y.o. female who has a history of an appendectomy.   1. Malignant neoplasm of upper-inner quadrant of left breast in female, cT2N0M0, stage IIA, ER: Positive, PR: Negative, HER2: Negative, Grade 3 --We discussed her imaging findings and the biopsy results in great details. -Giving the relatively large size of the tumor and small size of breast, she likely need mastectomy. She is agreeable with that. She was seen by Dr. Marlou Starks today and likely will proceed with surgery soon.  -Given her stage II disease, ER+/HER2- disease, normal lab, I do not think she needs staging scans. -We discussed the option of neoadjuvant chemotherapy if her Oncotype test on core biopsy returns as high risk disease, to shrink her tumor, to see if lumpectomy is possible after neoadjuvant. After lengthy discussion, patient decided to proceed with mastectomy and sentinel lymph node biopsy. -I recommend a Oncotype Dx test on the surgical sample and we'll  make a decision about adjuvant chemotherapy based on the Oncotype result. Written material of this test was given to her. She is young and fit, would be a good candidate for chemotherapy if her Oncotype recurrence score is high. -If her surgical sentinel lymph node node positive, I recommend mammaprint for further risk stratification and guide adjuvant chemotherapy. -Her last menstrual period was one year ago, likely post menopause, I'll check her [facility] on her next visit to confirm. -Giving the strong ER expression in her postmenopausal status, I recommend adjuvant endocrine therapy with aromatase inhibitor for a total of 5-10 years to reduce the risk of cancer recurrence. Potential benefits and side effects were discussed with patient and she is interested. -She was also seen by radiation oncologist Dr. Isidore Moos today. If her surgical sentinel lymph nodes were negative, she would not need post mastectomy radiation.  -We also discussed the breast cancer surveillance after her surgery. She will continue annual screening mammogram, self exam, and a routine office visit with lab and exam with [place]. -Patient's native language is [place], I did explain the above to her in [place], to make sure she understands completely.  -I encouraged her to have healthy diet and exercise regularly.    2. Chronic dysuria and left frank pain  -She has trouble completely emptying her bladder, which has been chronic for many years -I strongly encourage her to have a PCP to help monitor her health and I offer to refer her if she would like. She declines for now.   3. Smoking cessation  -We discussed health complications with smoking  -I strongly suggest her to stop and she has tried to stop smoking in the past   PLAN: -She'll proceed to mastectomy and sentinel lymph node biopsy by Dr. Marlou Starks soon  -We'll send her surgical sample for Oncotype, or mammaprint if node positive.  -Will see her 2-3 weeks after mastectomy to discuss  her surgical path and oncotype result   -Dr. Marlou Starks will refer her to Plastic surgeon   No orders of the defined types were placed in this encounter.   All questions were answered. The patient knows to call the clinic with any problems, questions or concerns. I spent 55 minutes counseling the patient face to  face. The total time spent in the appointment was 60 minutes and more than 50% was on counseling.  This document serves as a record of services personally performed by [name], MD. It was created on her behalf by Joslyn Devon, a trained medical scribe. The creation of this record is based on the scribe's personal observations and the provider's statements to them. This document has been checked and approved by the attending provider.     [name], MD 04/25/2017 4:45 PM

## 2017-04-25 NOTE — Patient Instructions (Signed)

## 2017-04-25 NOTE — Therapy (Signed)
[place], [place], [address] Phone: [phone]   Fax:  [phone]  Physical Therapy Evaluation  Patient Details  Name: Kristina Nicholson MRN: [id number] Date of Birth: 1966/06/16 Referring Provider: Dr. Autumn Messing  Encounter Date: 04/25/2017      PT End of Session - 04/25/17 1054    Visit Number 1   Number of Visits 1   PT Start Time 0908   PT Stop Time 0938   PT Time Calculation (min) 30 min   Activity Tolerance Patient tolerated treatment well   Behavior During Therapy [facility] for tasks assessed/performed      Past Medical History:  Diagnosis Date  . Breast mass    lt breast mass ? doesnt know how long  . Malignant neoplasm of upper-inner quadrant of left breast in female, estrogen receptor positive ([place]) 04/18/2017    Past Surgical History:  Procedure Laterality Date  . APPENDECTOMY      There were no vitals filed for this visit.       Subjective Assessment - 04/25/17 1011    Subjective Patient reports she is here today to be seen by her medical team for her newly diagnosed left breast cancer.   Patient is accompained by: Family member   Pertinent History Patient was diagnosed on 04/10/17 with left grade 2 invasive ductal carcinoma breast cancer. It measures 2.4 cm and is located in the upper inner quadrant. It is ER positive, PR negative and HER2 negative with a Ki67 of 10%. She has no other health problems.   Patient Stated Goals Reduce lymphedema risk and learn post op shoulder ROM HEP   Currently in Pain? No/denies            [facility] PT Assessment - 04/25/17 0001      Assessment   Medical Diagnosis Left breast cancer   Referring Provider Dr. Autumn Messing   Onset Date/Surgical Date 04/10/17   Hand Dominance Right   Prior Therapy none     Precautions   Precautions Other (comment)   Precaution Comments active cancer     Restrictions   Weight Bearing Restrictions No     Balance  Screen   Has the patient fallen in the past 6 months No   Has the patient had a decrease in activity level because of a fear of falling?  No   Is the patient reluctant to leave their home because of a fear of falling?  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[employer]; carries heavy trays   Leisure She does not exercise     Cognition   Overall Cognitive Status Within Functional Limits for tasks assessed     Posture/Postural Control   Posture/Postural Control No significant limitations     ROM / Strength   AROM / PROM / Strength AROM;Strength     AROM   AROM Assessment Site Shoulder;Cervical   Right/Left Shoulder Right;Left   Right Shoulder Extension 45 Degrees   Right Shoulder Flexion 161 Degrees   Right Shoulder ABduction 172 Degrees   Right Shoulder Internal Rotation 54 Degrees   Right Shoulder External Rotation 90 Degrees   Left Shoulder Extension 43 Degrees   Left Shoulder Flexion 161 Degrees   Left  Shoulder ABduction 166 Degrees   Left Shoulder Internal Rotation 68 Degrees   Left Shoulder External Rotation 90 Degrees   Cervical Flexion WNL   Cervical Extension WNL   Cervical - Right Side Bend WNL   Cervical - Left Side Bend WNL   Cervical - Right Rotation WNl   Cervical - Left Rotation WNL     Strength   Overall Strength Within functional limits for tasks performed           LYMPHEDEMA/ONCOLOGY QUESTIONNAIRE - 04/25/17 1051      Type   Cancer Type Left breast cancer     Lymphedema Assessments   Lymphedema Assessments Upper extremities     Right Upper Extremity Lymphedema   10 cm Proximal to Olecranon Process 26.7 cm   Olecranon Process 24 cm   10 cm Proximal to Ulnar Styloid Process 22.3 cm   Just Proximal to Ulnar Styloid  Process 15.7 cm   Across Hand at [employer] 19.6 cm   At [place] of 2nd Digit 6.6 cm     Left Upper Extremity Lymphedema   10 cm Proximal to Olecranon Process 25.3 cm   Olecranon Process 23 cm   10 cm Proximal to Ulnar Styloid Process 21.2 cm   Just Proximal to Ulnar Styloid Process 15.3 cm   Across Hand at [employer] 18.8 cm   At [place] of 2nd Digit 6 cm         Objective measurements completed on examination: See above findings.        Patient was instructed today in a home exercise program today for post op shoulder range of motion. These included active assist shoulder flexion in sitting, scapular retraction, wall walking with shoulder abduction, and hands behind head external rotation.  She was encouraged to do these twice a day, holding 3 seconds and repeating 5 times when permitted by her physician.              PT Education - 04/25/17 1052    Education provided Yes   Education Details Lymphedema risk reduction and post op shoulder ROM HEP   Person(s) Educated Patient;Spouse   Methods Explanation;Demonstration;Handout   Comprehension Returned demonstration;Verbalized understanding              Breast Clinic Goals - 04/25/17 1059      Patient will be able to verbalize understanding of pertinent lymphedema risk reduction practices relevant to her diagnosis specifically related to skin care.   Time 1   Period Days   Status Achieved     Patient will be able to return demonstrate and/or verbalize understanding of the post-op home exercise program related to regaining shoulder range of motion.   Time 1   Period Days   Status Achieved     Patient will be able to verbalize understanding of the importance of attending the postoperative After Breast Cancer Class for further lymphedema risk reduction education and therapeutic exercise.   Time 1   Period Days   Status Achieved               Plan - 04/25/17 1055    Clinical Impression  Statement Patient was diagnosed on 04/10/17 with left grade 2 invasive ductal carcinoma breast cancer. It measures 2.4 cm and is located in the upper inner quadrant. It is ER positive, PR negative and HER2 negative with a Ki67 of 10%. She has no other health problems. Her multidisciplinary medical team met prior to her assessments to  determine a recommended treatment plan. She is planning to have a left lumpectomy and senitnel node biopsy followed by radiation and anti-estrogen therapy. She will have Oncotype testing as well. She may beneift from post op PT to regain shoulder ROM and strength in order to return to full function at her job.   History and Personal Factors relevant to plan of care: Language barrier   Clinical Presentation Stable   Clinical Presentation due to: condition is stable   Clinical Decision Making Low   Rehab Potential Excellent   Clinical Impairments Affecting Rehab Potential None   PT Frequency One time visit   PT Treatment/Interventions Patient/family education;Therapeutic exercise   PT Next Visit Plan Will f/u after surgery to determine PT needs   PT Home Exercise Plan Post op shoulder ROM HEP   Consulted and Agree with Plan of Care Patient      Patient will benefit from skilled therapeutic intervention in order to improve the following deficits and impairments:  Decreased range of motion, Impaired UE functional use, Decreased knowledge of precautions, Postural dysfunction  Visit Diagnosis: Carcinoma of upper-inner quadrant of left breast in female, estrogen receptor positive ([place])   Patient will follow up at outpatient cancer rehab if needed following surgery.  If the patient requires physical therapy at that time, a specific plan will be dictated and sent to the referring physician for approval. The patient was educated today on appropriate basic range of motion exercises to begin post operatively and the importance of attending the After Breast Cancer class following  surgery.  Patient was educated today on lymphedema risk reduction practices as it pertains to recommendations that will benefit the patient immediately following surgery.  She verbalized good understanding.  No additional physical therapy is indicated at this time.      Problem List Patient Active Problem List   Diagnosis Date Noted  . Malignant neoplasm of upper-inner quadrant of left breast in female, estrogen receptor positive ([place]) 04/18/2017    Annia Friendly, PT 04/25/17 11:00 AM  [place], [place], [address] Phone: [phone]   Fax:  [phone]  Name: Kristina Nicholson MRN: [id number] Date of Birth: 1966-02-07

## 2017-04-25 NOTE — Progress Notes (Signed)
Clinical Social Work Kristina Nicholson Psychosocial Distress Screening [place]  Patient completed distress screening protocol and scored a 0 on the Psychosocial Distress Thermometer which indicates no distress. Clinical Social Worker met with patient and patients husband in [facility] to assess for distress and other psychosocial needs. Patient stated she was feeling positive after meeting with the treatment team and getting more information on her treatment plan. CSW and patient discussed common feeling and emotions when being diagnosed with cancer, and the importance of support during treatment. CSW informed patient of the support team and support services at [facility]. CSW provided contact information and encouraged patient to call with any questions or concerns.  ONCBCN DISTRESS SCREENING 04/25/2017  Screening Type Initial Screening  Distress experienced in past week (1-10) 0     [name], MSW, LCSW, OSW-C Clinical Social Worker [facility] [phone]

## 2017-04-26 DIAGNOSIS — C50212 Malignant neoplasm of upper-inner quadrant of left female breast: Secondary | ICD-10-CM | POA: Diagnosis not present

## 2017-04-26 DIAGNOSIS — Z170 Estrogen receptor positive status [ER+]: Secondary | ICD-10-CM | POA: Diagnosis not present

## 2017-04-27 ENCOUNTER — Telehealth: Payer: Self-pay | Admitting: *Deleted

## 2017-04-27 NOTE — Telephone Encounter (Signed)
Spoke to pt husband regarding [facility] from 6.13.18. Denies questions at this time

## 2017-05-23 DIAGNOSIS — C50212 Malignant neoplasm of upper-inner quadrant of left female breast: Secondary | ICD-10-CM | POA: Diagnosis not present

## 2017-05-23 DIAGNOSIS — Z170 Estrogen receptor positive status [ER+]: Secondary | ICD-10-CM | POA: Diagnosis not present

## 2017-06-11 ENCOUNTER — Encounter (HOSPITAL_COMMUNITY): Payer: Self-pay

## 2017-06-11 ENCOUNTER — Encounter (HOSPITAL_COMMUNITY)
Admission: RE | Admit: 2017-06-11 | Discharge: 2017-06-11 | Disposition: A | Discharge status: Home / Self Care | Payer: BLUE CROSS/BLUE SHIELD | Source: Ambulatory Visit | Attending: General Surgery | Admitting: General Surgery

## 2017-06-11 DIAGNOSIS — Z01812 Encounter for preprocedural laboratory examination: Secondary | ICD-10-CM

## 2017-06-11 DIAGNOSIS — R51 Headache: Secondary | ICD-10-CM | POA: Diagnosis not present

## 2017-06-11 DIAGNOSIS — C50912 Malignant neoplasm of unspecified site of left female breast: Secondary | ICD-10-CM

## 2017-06-11 DIAGNOSIS — C50212 Malignant neoplasm of upper-inner quadrant of left female breast: Secondary | ICD-10-CM | POA: Diagnosis present

## 2017-06-11 DIAGNOSIS — F17200 Nicotine dependence, unspecified, uncomplicated: Secondary | ICD-10-CM | POA: Diagnosis not present

## 2017-06-11 DIAGNOSIS — Z800 Family history of malignant neoplasm of digestive organs: Secondary | ICD-10-CM | POA: Diagnosis not present

## 2017-06-11 DIAGNOSIS — D1722 Benign lipomatous neoplasm of skin and subcutaneous tissue of left arm: Secondary | ICD-10-CM | POA: Diagnosis not present

## 2017-06-11 DIAGNOSIS — Z170 Estrogen receptor positive status [ER+]: Secondary | ICD-10-CM | POA: Diagnosis not present

## 2017-06-11 DIAGNOSIS — C773 Secondary and unspecified malignant neoplasm of axilla and upper limb lymph nodes: Secondary | ICD-10-CM | POA: Diagnosis not present

## 2017-06-11 DIAGNOSIS — D0512 Intraductal carcinoma in situ of left breast: Secondary | ICD-10-CM | POA: Diagnosis not present

## 2017-06-11 DIAGNOSIS — M549 Dorsalgia, unspecified: Secondary | ICD-10-CM | POA: Diagnosis not present

## 2017-06-11 HISTORY — DX: Headache: R51

## 2017-06-11 HISTORY — DX: Headache, unspecified: R51.9

## 2017-06-11 HISTORY — DX: Urinary tract infection, site not specified: N39.0

## 2017-06-11 LAB — CBC
HCT: 37.5 % (ref 36.0–46.0)
Hemoglobin: 12.6 g/dL (ref 12.0–15.0)
MCH: 30.6 pg (ref 26.0–34.0)
MCHC: 33.6 g/dL (ref 30.0–36.0)
MCV: 91 fL (ref 78.0–100.0)
PLATELETS: 262 10*3/uL (ref 150–400)
RBC: 4.12 MIL/uL (ref 3.87–5.11)
RDW: 13.3 % (ref 11.5–15.5)
WBC: 5.5 10*3/uL (ref 4.0–10.5)

## 2017-06-11 LAB — BASIC METABOLIC PANEL
Anion gap: 9 (ref 5–15)
BUN: 13 mg/dL (ref 6–20)
CALCIUM: 9.3 mg/dL (ref 8.9–10.3)
CHLORIDE: 107 mmol/L (ref 101–111)
CO2: 25 mmol/L (ref 22–32)
CREATININE: 0.79 mg/dL (ref 0.44–1.00)
GFR calc non Af Amer: >60 mL/min (ref >60)
Glucose, Bld: 95 mg/dL (ref 65–99)
Potassium: 4.1 mmol/L (ref 3.5–5.1)
SODIUM: 141 mmol/L (ref 135–145)

## 2017-06-11 LAB — HCG, SERUM, QUALITATIVE: PREG SERUM: NEGATIVE

## 2017-06-11 NOTE — Pre-Procedure Instructions (Signed)
    Kristina Nicholson  06/11/2017      [facility] [place], [address] AT [address] [place] [address] Phone: [phone] Fax: [phone]    Your procedure is scheduled on Wednesday, June 13, 2017  Report to [facility] Admitting at 8:30 A.M.  Call this number if you have problems the morning of surgery:  [phone]   Remember: Have Boost Breeze drink completed 2 hours prior to your arrival to the hospital.  Do not eat food or drink liquids after midnight Tuesday, June 12, 2017  Take these medicines the morning of surgery with A SIP OF WATER : if needed: acetaminophen (TYLENOL) for pain or headache.  Stop taking Aspirin, vitamins, fish oil and herbal medications. Do not take any NSAIDs ie: Ibuprofen, Advil, Naproxen (Aleve), Motrin, BC and Goody Powder or any medication containing Aspirin; stop now.  Do not wear jewelry, make-up or nail polish.  Do not wear lotions, powders, or perfumes, or deoderant.  Do not shave 48 hours prior to surgery.    Do not bring valuables to the hospital.  [facility] is not responsible for any belongings or valuables.  Contacts, dentures or bridgework may not be worn into surgery.  Leave your suitcase in the car.  After surgery it may be brought to your room. For patients admitted to the hospital, discharge time will be determined by your treatment team. Patients discharged the day of surgery will not be allowed to drive home.  Special instructions: Shower the night before surgery and the morning of surgery with CHG. Please read over the following fact sheets that you were given. Pain Booklet, Coughing and Deep Breathing and Surgical Site Infection Prevention

## 2017-06-11 NOTE — Progress Notes (Signed)
Pt denies SOB, chest pain, and being under the care of a cardiologist. Pt denies having a stress test, echo and cardiac cath. Pt denies having an EKG and chest x ray within the last year. Pt denies recent labs. Pt stated that she is post menopausal but has recently had some vaginal bleeding. Pt advised to make MD aware.

## 2017-06-13 ENCOUNTER — Ambulatory Visit (HOSPITAL_COMMUNITY): Payer: BLUE CROSS/BLUE SHIELD

## 2017-06-13 ENCOUNTER — Encounter (HOSPITAL_COMMUNITY): Payer: Self-pay | Admitting: Anesthesiology

## 2017-06-13 ENCOUNTER — Encounter (HOSPITAL_COMMUNITY)
Admission: RE | Disposition: A | Discharge status: Home / Self Care | Payer: Self-pay | Source: Ambulatory Visit | Attending: General Surgery

## 2017-06-13 ENCOUNTER — Encounter (HOSPITAL_COMMUNITY)
Admission: RE | Admit: 2017-06-13 | Discharge: 2017-06-13 | Disposition: A | Discharge status: Home / Self Care | Payer: BLUE CROSS/BLUE SHIELD | Source: Ambulatory Visit | Attending: General Surgery | Admitting: General Surgery

## 2017-06-13 ENCOUNTER — Ambulatory Visit (HOSPITAL_COMMUNITY)
Admission: RE | Admit: 2017-06-13 | Discharge: 2017-06-14 | Disposition: A | Discharge status: Home / Self Care | Payer: BLUE CROSS/BLUE SHIELD | Source: Ambulatory Visit | Attending: General Surgery | Admitting: General Surgery

## 2017-06-13 DIAGNOSIS — M549 Dorsalgia, unspecified: Secondary | ICD-10-CM | POA: Insufficient documentation

## 2017-06-13 DIAGNOSIS — Z800 Family history of malignant neoplasm of digestive organs: Secondary | ICD-10-CM | POA: Insufficient documentation

## 2017-06-13 DIAGNOSIS — G8918 Other acute postprocedural pain: Secondary | ICD-10-CM | POA: Diagnosis not present

## 2017-06-13 DIAGNOSIS — D0512 Intraductal carcinoma in situ of left breast: Secondary | ICD-10-CM | POA: Insufficient documentation

## 2017-06-13 DIAGNOSIS — Z170 Estrogen receptor positive status [ER+]: Principal | ICD-10-CM

## 2017-06-13 DIAGNOSIS — F17200 Nicotine dependence, unspecified, uncomplicated: Secondary | ICD-10-CM | POA: Diagnosis not present

## 2017-06-13 DIAGNOSIS — R51 Headache: Secondary | ICD-10-CM | POA: Insufficient documentation

## 2017-06-13 DIAGNOSIS — C773 Secondary and unspecified malignant neoplasm of axilla and upper limb lymph nodes: Secondary | ICD-10-CM | POA: Diagnosis not present

## 2017-06-13 DIAGNOSIS — C50212 Malignant neoplasm of upper-inner quadrant of left female breast: Secondary | ICD-10-CM

## 2017-06-13 DIAGNOSIS — C50912 Malignant neoplasm of unspecified site of left female breast: Secondary | ICD-10-CM | POA: Diagnosis not present

## 2017-06-13 DIAGNOSIS — D1722 Benign lipomatous neoplasm of skin and subcutaneous tissue of left arm: Secondary | ICD-10-CM | POA: Insufficient documentation

## 2017-06-13 HISTORY — PX: LIPOMA EXCISION: SHX5283

## 2017-06-13 HISTORY — PX: MASTECTOMY W/ SENTINEL NODE BIOPSY: SHX2001

## 2017-06-13 SURGERY — MASTECTOMY WITH SENTINEL LYMPH NODE BIOPSY
Anesthesia: General | Site: Breast | Laterality: Left

## 2017-06-13 MED ORDER — SUCCINYLCHOLINE CHLORIDE 20 MG/ML IJ SOLN
INTRAMUSCULAR | Status: DC | PRN
  Administered 2017-06-13: 100 mg via INTRAVENOUS

## 2017-06-13 MED ORDER — PHENYLEPHRINE 40 MCG/ML (10ML) SYRINGE FOR IV PUSH (FOR BLOOD PRESSURE SUPPORT)
PREFILLED_SYRINGE | INTRAVENOUS | Status: AC
  Filled 2017-06-13: qty 10

## 2017-06-13 MED ORDER — LIDOCAINE HCL (CARDIAC) 20 MG/ML IV SOLN
INTRAVENOUS | Status: DC | PRN
  Administered 2017-06-13: 50 mg via INTRAVENOUS

## 2017-06-13 MED ORDER — MIDAZOLAM HCL 2 MG/2ML IJ SOLN
1.5000 mg | Freq: Once | INTRAMUSCULAR | Status: AC
  Administered 2017-06-13: 1.5 mg via INTRAVENOUS

## 2017-06-13 MED ORDER — BUPIVACAINE-EPINEPHRINE 0.25% -1:200000 IJ SOLN
INTRAMUSCULAR | Status: DC | PRN
  Administered 2017-06-13: 3 mL

## 2017-06-13 MED ORDER — METHYLENE BLUE 0.5 % INJ SOLN
INTRAVENOUS | Status: AC
  Filled 2017-06-13: qty 10

## 2017-06-13 MED ORDER — FENTANYL CITRATE (PF) 100 MCG/2ML IJ SOLN
INTRAMUSCULAR | Status: AC
  Filled 2017-06-13: qty 2

## 2017-06-13 MED ORDER — CEFAZOLIN SODIUM-DEXTROSE 2-4 GM/100ML-% IV SOLN
2.0000 g | INTRAVENOUS | Status: AC
  Administered 2017-06-13: 2 g via INTRAVENOUS
  Filled 2017-06-13: qty 100

## 2017-06-13 MED ORDER — ONDANSETRON HCL 4 MG/2ML IJ SOLN
INTRAMUSCULAR | Status: AC
  Filled 2017-06-13: qty 2

## 2017-06-13 MED ORDER — METHOCARBAMOL 500 MG PO TABS: 500.0000 mg | ORAL_TABLET | Freq: Four times a day (QID) | ORAL | Status: DC | PRN

## 2017-06-13 MED ORDER — TECHNETIUM TC 99M SULFUR COLLOID FILTERED
1.0000 | Freq: Once | INTRAVENOUS | Status: AC | PRN
  Administered 2017-06-13: 1 via INTRADERMAL

## 2017-06-13 MED ORDER — LIDOCAINE 2% (20 MG/ML) 5 ML SYRINGE
INTRAMUSCULAR | Status: AC
  Filled 2017-06-13: qty 5

## 2017-06-13 MED ORDER — GABAPENTIN 300 MG PO CAPS
300.0000 mg | ORAL_CAPSULE | ORAL | Status: AC
  Administered 2017-06-13: 300 mg via ORAL
  Filled 2017-06-13: qty 1

## 2017-06-13 MED ORDER — DEXAMETHASONE SODIUM PHOSPHATE 10 MG/ML IJ SOLN
INTRAMUSCULAR | Status: AC
  Filled 2017-06-13: qty 1

## 2017-06-13 MED ORDER — ACETAMINOPHEN 500 MG PO TABS
1000.0000 mg | ORAL_TABLET | ORAL | Status: AC
  Administered 2017-06-13: 1000 mg via ORAL
  Filled 2017-06-13: qty 2

## 2017-06-13 MED ORDER — 0.9 % SODIUM CHLORIDE (POUR BTL) OPTIME
TOPICAL | Status: DC | PRN
  Administered 2017-06-13 (×2): 1000 mL

## 2017-06-13 MED ORDER — PANTOPRAZOLE SODIUM 40 MG IV SOLR
40.0000 mg | Freq: Every day | INTRAVENOUS | Status: DC
  Administered 2017-06-13: 40 mg via INTRAVENOUS
  Filled 2017-06-13: qty 40

## 2017-06-13 MED ORDER — MIDAZOLAM HCL 2 MG/2ML IJ SOLN
INTRAMUSCULAR | Status: AC
  Filled 2017-06-13: qty 2

## 2017-06-13 MED ORDER — LACTATED RINGERS IV SOLN
INTRAVENOUS | Status: DC | PRN
  Administered 2017-06-13 (×2): via INTRAVENOUS

## 2017-06-13 MED ORDER — CHLORHEXIDINE GLUCONATE CLOTH 2 % EX PADS: 6.0000 | MEDICATED_PAD | Freq: Once | CUTANEOUS | Status: DC

## 2017-06-13 MED ORDER — HEPARIN SODIUM (PORCINE) 5000 UNIT/ML IJ SOLN
5000.0000 [IU] | Freq: Three times a day (TID) | INTRAMUSCULAR | Status: DC
  Administered 2017-06-14: 5000 [IU] via SUBCUTANEOUS

## 2017-06-13 MED ORDER — ONDANSETRON HCL 4 MG/2ML IJ SOLN: 4.0000 mg | Freq: Four times a day (QID) | INTRAMUSCULAR | Status: DC | PRN

## 2017-06-13 MED ORDER — LACTATED RINGERS IV SOLN
INTRAVENOUS | Status: DC
  Administered 2017-06-13: 09:00:00 via INTRAVENOUS

## 2017-06-13 MED ORDER — FENTANYL CITRATE (PF) 100 MCG/2ML IJ SOLN
75.0000 ug | Freq: Once | INTRAMUSCULAR | Status: AC
  Administered 2017-06-13: 75 ug via INTRAVENOUS

## 2017-06-13 MED ORDER — BUPIVACAINE-EPINEPHRINE 0.25% -1:200000 IJ SOLN
INTRAMUSCULAR | Status: AC
  Filled 2017-06-13: qty 1

## 2017-06-13 MED ORDER — CELECOXIB 200 MG PO CAPS
400.0000 mg | ORAL_CAPSULE | ORAL | Status: AC
  Administered 2017-06-13: 400 mg via ORAL
  Filled 2017-06-13: qty 2

## 2017-06-13 MED ORDER — PHENYLEPHRINE HCL 10 MG/ML IJ SOLN
INTRAMUSCULAR | Status: DC | PRN
  Administered 2017-06-13 (×3): 40 ug via INTRAVENOUS
  Administered 2017-06-13: 80 ug via INTRAVENOUS
  Administered 2017-06-13: 40 ug via INTRAVENOUS

## 2017-06-13 MED ORDER — HYDROMORPHONE HCL 1 MG/ML IJ SOLN: 0.2500 mg | INTRAMUSCULAR | Status: DC | PRN

## 2017-06-13 MED ORDER — PROPOFOL 10 MG/ML IV BOLUS
INTRAVENOUS | Status: AC
  Filled 2017-06-13: qty 20

## 2017-06-13 MED ORDER — MORPHINE SULFATE (PF) 4 MG/ML IV SOLN: 1.0000 mg | INTRAVENOUS | Status: DC | PRN

## 2017-06-13 MED ORDER — FENTANYL CITRATE (PF) 250 MCG/5ML IJ SOLN
INTRAMUSCULAR | Status: AC
  Filled 2017-06-13: qty 5

## 2017-06-13 MED ORDER — KCL IN DEXTROSE-NACL 20-5-0.9 MEQ/L-%-% IV SOLN
INTRAVENOUS | Status: DC
  Administered 2017-06-13: 16:00:00 via INTRAVENOUS
  Filled 2017-06-13 (×2): qty 1000

## 2017-06-13 MED ORDER — PROPOFOL 10 MG/ML IV BOLUS
INTRAVENOUS | Status: DC | PRN
  Administered 2017-06-13: 150 mg via INTRAVENOUS
  Administered 2017-06-13: 100 mg via INTRAVENOUS

## 2017-06-13 MED ORDER — ONDANSETRON 4 MG PO TBDP: 4.0000 mg | ORAL_TABLET | Freq: Four times a day (QID) | ORAL | Status: DC | PRN

## 2017-06-13 MED ORDER — SODIUM CHLORIDE 0.9 % IJ SOLN
INTRAMUSCULAR | Status: AC
  Filled 2017-06-13: qty 10

## 2017-06-13 MED ORDER — HYDROCODONE-ACETAMINOPHEN 5-325 MG PO TABS: 1.0000 | ORAL_TABLET | ORAL | Status: DC | PRN

## 2017-06-13 MED ORDER — ONDANSETRON HCL 4 MG/2ML IJ SOLN
INTRAMUSCULAR | Status: DC | PRN
  Administered 2017-06-13: 4 mg via INTRAVENOUS

## 2017-06-13 MED ORDER — FENTANYL CITRATE (PF) 100 MCG/2ML IJ SOLN
INTRAMUSCULAR | Status: DC | PRN
  Administered 2017-06-13: 100 ug via INTRAVENOUS

## 2017-06-13 MED ORDER — DEXAMETHASONE SODIUM PHOSPHATE 10 MG/ML IJ SOLN
INTRAMUSCULAR | Status: DC | PRN
  Administered 2017-06-13: 10 mg via INTRAVENOUS

## 2017-06-13 SURGICAL SUPPLY — 67 items
APPLIER CLIP 9.375 MED OPEN (MISCELLANEOUS) ×3
BINDER BREAST LRG (GAUZE/BANDAGES/DRESSINGS) IMPLANT
BINDER BREAST MEDIUM (GAUZE/BANDAGES/DRESSINGS) ×3 IMPLANT
BINDER BREAST XLRG (GAUZE/BANDAGES/DRESSINGS) IMPLANT
BIOPATCH RED 1 DISK 7.0 (GAUZE/BANDAGES/DRESSINGS) ×3 IMPLANT
BNDG COHESIVE 3X5 TAN STRL LF (GAUZE/BANDAGES/DRESSINGS) ×3 IMPLANT
BNDG COHESIVE 4X5 TAN STRL (GAUZE/BANDAGES/DRESSINGS) ×3 IMPLANT
CANISTER SUCT 3000ML PPV (MISCELLANEOUS) ×3 IMPLANT
CHLORAPREP W/TINT 26ML (MISCELLANEOUS) ×6 IMPLANT
CLIP APPLIE 9.375 MED OPEN (MISCELLANEOUS) ×2 IMPLANT
CONT SPEC 4OZ CLIKSEAL STRL BL (MISCELLANEOUS) ×12 IMPLANT
COVER PROBE W GEL 5X96 (DRAPES) ×3 IMPLANT
COVER SURGICAL LIGHT HANDLE (MISCELLANEOUS) ×3 IMPLANT
DECANTER SPIKE VIAL GLASS SM (MISCELLANEOUS) IMPLANT
DERMABOND ADVANCED (GAUZE/BANDAGES/DRESSINGS) ×1
DERMABOND ADVANCED .7 DNX12 (GAUZE/BANDAGES/DRESSINGS) ×2 IMPLANT
DEVICE DISSECT PLASMABLAD 3.0S (MISCELLANEOUS) ×2 IMPLANT
DRAIN CHANNEL 19F RND (DRAIN) IMPLANT
DRAPE CHEST BREAST 15X10 FENES (DRAPES) ×3 IMPLANT
DRAPE LAPAROTOMY 100X72 PEDS (DRAPES) IMPLANT
DRAPE UTILITY XL STRL (DRAPES) ×6 IMPLANT
DRSG PAD ABDOMINAL 8X10 ST (GAUZE/BANDAGES/DRESSINGS) IMPLANT
DRSG TEGADERM 4X4.75 (GAUZE/BANDAGES/DRESSINGS) IMPLANT
ELECT CAUTERY BLADE 6.4 (BLADE) ×3 IMPLANT
ELECT REM PT RETURN 9FT ADLT (ELECTROSURGICAL) ×3
ELECTRODE REM PT RTRN 9FT ADLT (ELECTROSURGICAL) ×2 IMPLANT
EVACUATOR SILICONE 100CC (DRAIN) IMPLANT
GAUZE SPONGE 4X4 12PLY STRL (GAUZE/BANDAGES/DRESSINGS) ×3 IMPLANT
GLOVE BIO SURGEON STRL SZ7.5 (GLOVE) ×6 IMPLANT
GLOVE BIOGEL PI IND STRL 6.5 (GLOVE) ×2 IMPLANT
GLOVE BIOGEL PI INDICATOR 6.5 (GLOVE) ×1
GLOVE ECLIPSE 6.0 STRL STRAW (GLOVE) ×3 IMPLANT
GLOVE INDICATOR 7.0 STRL GRN (GLOVE) ×3 IMPLANT
GLOVE SURG ORTHO 8.0 STRL STRW (GLOVE) ×3 IMPLANT
GLOVE SURG SS PI 6.5 STRL IVOR (GLOVE) ×3 IMPLANT
GOWN STRL REUS W/ TWL LRG LVL3 (GOWN DISPOSABLE) ×8 IMPLANT
GOWN STRL REUS W/TWL LRG LVL3 (GOWN DISPOSABLE) ×4
KIT BASIN OR (CUSTOM PROCEDURE TRAY) ×3 IMPLANT
KIT ROOM TURNOVER OR (KITS) ×3 IMPLANT
LIGHT WAVEGUIDE WIDE FLAT (MISCELLANEOUS) IMPLANT
NEEDLE 18GX1X1/2 (RX/OR ONLY) (NEEDLE) IMPLANT
NEEDLE FILTER BLUNT 18X 1/2SAF (NEEDLE)
NEEDLE FILTER BLUNT 18X1 1/2 (NEEDLE) IMPLANT
NEEDLE HYPO 25GX1X1/2 BEV (NEEDLE) ×3 IMPLANT
NS IRRIG 1000ML POUR BTL (IV SOLUTION) ×3 IMPLANT
PACK GENERAL/GYN (CUSTOM PROCEDURE TRAY) ×3 IMPLANT
PACK SURGICAL SETUP 50X90 (CUSTOM PROCEDURE TRAY) ×3 IMPLANT
PAD ARMBOARD 7.5X6 YLW CONV (MISCELLANEOUS) ×6 IMPLANT
PENCIL BUTTON HOLSTER BLD 10FT (ELECTRODE) ×3 IMPLANT
PLASMABLADE 3.0S (MISCELLANEOUS) ×3
SPECIMEN JAR SMALL (MISCELLANEOUS) IMPLANT
SPECIMEN JAR X LARGE (MISCELLANEOUS) ×3 IMPLANT
SPONGE LAP 18X18 X RAY DECT (DISPOSABLE) ×6 IMPLANT
STOCKINETTE IMPERVIOUS 9X36 MD (GAUZE/BANDAGES/DRESSINGS) ×3 IMPLANT
SUT ETHILON 3 0 FSL (SUTURE) ×3 IMPLANT
SUT MNCRL AB 4-0 PS2 18 (SUTURE) ×6 IMPLANT
SUT VIC AB 3-0 54X BRD REEL (SUTURE) IMPLANT
SUT VIC AB 3-0 BRD 54 (SUTURE)
SUT VIC AB 3-0 SH 18 (SUTURE) ×3 IMPLANT
SUT VIC AB 3-0 SH 27 (SUTURE) ×1
SUT VIC AB 3-0 SH 27XBRD (SUTURE) ×2 IMPLANT
SYR BULB 3OZ (MISCELLANEOUS) ×3 IMPLANT
SYR CONTROL 10ML LL (SYRINGE) ×3 IMPLANT
TOWEL OR 17X24 6PK STRL BLUE (TOWEL DISPOSABLE) ×3 IMPLANT
TOWEL OR 17X26 10 PK STRL BLUE (TOWEL DISPOSABLE) IMPLANT
TUBE CONNECTING 12X1/4 (SUCTIONS) ×6 IMPLANT
YANKAUER SUCT BULB TIP NO VENT (SUCTIONS) ×6 IMPLANT

## 2017-06-13 NOTE — H&P (Signed)
She also has a 3cm lipoma on the left arm that she would like to have removed at the time of her mastectomy. I have discussed this with her including the risks and benefits and she understands and wishes to proceed

## 2017-06-13 NOTE — Anesthesia Procedure Notes (Signed)
Anesthesia Regional Block: Pectoralis block   Pre-Anesthetic Checklist: ,, timeout performed, Correct Patient, Correct Site, Correct Laterality, Correct Procedure, Correct Position, site marked, Risks and benefits discussed,  Surgical consent,  Pre-op evaluation,  At surgeon's request and post-op pain management  Laterality: Left  Prep: chloraprep       Needles:   Needle Type: Stimulator Needle - 40          Additional Needles:   Procedures: Doppler guided, Ultrasound guided,,,,,,  Narrative:  Start time: 06/13/2017 9:45 AM End time: 06/13/2017 10:00 AM Injection made incrementally with aspirations every 5 mL.  Performed by: Personally  Anesthesiologist: Belinda Block

## 2017-06-13 NOTE — Anesthesia Procedure Notes (Signed)
Anesthesia Regional Block: Pectoralis block   Pre-Anesthetic Checklist: ,, timeout performed, Correct Patient, Correct Site, Correct Laterality, Correct Procedure, Correct Position, site marked, Risks and benefits discussed,  Surgical consent,  Pre-op evaluation,  At surgeon's request and post-op pain management  Laterality: Left  Prep: chloraprep       Needles:   Needle Type: Stimulator Needle - 40          Additional Needles:   Procedures: Doppler guided, Ultrasound guided,,,,,,  Narrative:  Start time: 06/13/2017 9:50 AM End time: 06/13/2017 10:05 AM Injection made incrementally with aspirations every 5 mL.  Performed by: Personally  Anesthesiologist: Belinda Block

## 2017-06-13 NOTE — Addendum Note (Signed)
Addendum  created 06/13/17 1358 by Jenne Campus, CRNA   Anesthesia Intra Blocks edited, Sign clinical note

## 2017-06-13 NOTE — Op Note (Addendum)
06/13/2017  12:31 PM  PATIENT:  Kristina Nicholson  51 y.o. female  PRE-OPERATIVE DIAGNOSIS:  LEFT BREAST CANCER, 3CM LIPOMA LEFT ARM  POST-OPERATIVE DIAGNOSIS:  LEFT BREAST CANCER, 3CM LIPOMA LEFT ARM  PROCEDURE:  Procedure(s): LEFT MASTECTOMY WITH DEEP LEFT AXILLARY SENTINEL LYMPH NODE BIOPSY (Left) EXCISION 3CM LIPOMA ON LEFT ARM (Left)  SURGEON:  Surgeon(s) and Role:    * Jovita Kussmaul, MD - Primary  PHYSICIAN ASSISTANT:   ASSISTANTS: none   ANESTHESIA:   general  EBL:  Total I/O In: 1200 [I.V.:1200] Out: 15 [Blood:15]  BLOOD ADMINISTERED:none  DRAINS: (1) Jackson-Pratt drain(s) with closed bulb suction in the prepectoral space   LOCAL MEDICATIONS USED:  NONE  SPECIMEN:  Source of Specimen:  left breast tissue with sentinel nodes  DISPOSITION OF SPECIMEN:  PATHOLOGY  COUNTS:  YES  TOURNIQUET:  * No tourniquets in log *  DICTATION: .Dragon Dictation   After informed consent was obtained the patient was brought to the operating room and placed in the supine position on the operating room table. After adequate induction of general anesthesia the patient's left chest, breast, and axillary including the left upper arm were prepped with ChloraPrep, allowed to dry, draped in usual sterile manner. An appropriate timeout was performed. The patient had a palpable lipoma in the subcutaneous tissue of the left upper arm. A longitudinal incision was made overlying the palpable lipoma with a 10 blade knife. The incision was carried through the skin and subcutaneous tissue sharply with the 10 blade knife. The lipoma was then separated from the rest of the subcutaneous tissue by blunt hemostat dissection. Once it appeared as though the entire lipoma was removed and the area was fulgurated with the cautery until it was hemostatic. The wound was infiltrated with quarter percent Marcaine. The wound was then closed with interrupted 4-0 Monocryl subcuticular stitches. Attention was then  turned to the left breast. An elliptical incision was made around the nipple and areola complex in order to minimize the excess skin with a 10 blade knife. The incision was carried through the skin and subcutaneous tissue sharply with the plasma blade. Breast hooks were then used to elevate skin flaps anteriorly towards the ceiling. Thin skin flaps were then created circumferentially between the breast tissue in the subcutaneous fatty tissue. This dissection was carried circumferentially all the way to the chest wall. Next the breast was removed from the pectoralis muscle with the pectoralis fascia. Once the breast was free it was oriented with a stitch on the lateral skin. The breast was then sent to pathology for further evaluation. The neoprobe set to technetium and an area of radioactivity was readily identified in the left axilla. Dissection in this area was carried out with the plasma blade under the direction of the neoprobe. I was able to identify what appeared to be 2 hot lymph nodes. These were excised sharply with the plasma blade and the lymphatics were controlled with clips. Ex vivo counts on these 2 sentinel nodes were approximately 200 and 3000. These were sent to pathology for further evaluation. The skin flaps were then examined and felt as though the skin flap was a little too thick on the lower inner quadrant which is the quadrant where the tumor was. Some extra tissue was taken from the inferior medial skin flap sharply with Metzenbaum scissors and this tissue was sent separately as lower inner breast tissue. There was a small area of firm tissue encountered in the lower outer quadrant  and care was taken to make sure there was plenty of breast tissue to cover this area near the skin flap. The wound was then irrigated with copious amounts of saline. The wound was examined and found to be hemostatic. Next a small stab incision was made in the area to the operative area near the anterior axillary  line with a 15 blade knife. The tonsil clamp was placed through this incision and used to bring a 19 [place] round Blake drain into the operative bed. The drain was curled along the chest wall. The drain was anchored to the skin with a 3-0 nylon stitch. Next the superior and inferior skin flaps were grossly reapproximated with interrupted 3-0 Vicryl stitches. The skin was then closed with a running 4-0 Monocryl subcuticular stitch. Dermabond dressings were applied to both incisions.. The patient tolerated the procedure well. At the end of the case all needle sponge and instrument counts were correct. The patient was then awakened and taken to recovery in stable condition.  PLAN OF CARE: Admit for overnight observation  PATIENT DISPOSITION:  PACU - hemodynamically stable.   Delay start of Pharmacological VTE agent (>24hrs) due to surgical blood loss or risk of bleeding: no

## 2017-06-13 NOTE — Anesthesia Postprocedure Evaluation (Signed)
Anesthesia Post Note  Patient: Kristina Nicholson  Procedure(s) Performed: Procedure(s) (LRB): MASTECTOMY WITH SENTINEL LYMPH NODE BIOPSY (Left) EXCISION 3CM LIPOMA ON LEFT ARM (Left)     Patient location during evaluation: PACU Anesthesia Type: General Level of consciousness: awake Pain management: pain level controlled Vital Signs Assessment: post-procedure vital signs reviewed and stable Respiratory status: spontaneous breathing Cardiovascular status: stable Anesthetic complications: no    Last Vitals:  Vitals:   06/13/17 1315 06/13/17 1330  BP: 140/85 (!) 150/83  Pulse: (!) 58 (!) 59  Resp: 15 17  Temp:  36.4 C    Last Pain:  Vitals:   06/13/17 1245  TempSrc:   PainSc: 0-No pain                 Mela Perham

## 2017-06-13 NOTE — Transfer of Care (Signed)
Immediate Anesthesia Transfer of Care Note  Patient: Kristina Nicholson  Procedure(s) Performed: Procedure(s): MASTECTOMY WITH SENTINEL LYMPH NODE BIOPSY (Left) EXCISION 3CM LIPOMA ON LEFT ARM (Left)  Patient Location: PACU  Anesthesia Type:General  Level of Consciousness: oriented, sedated and patient cooperative  Airway & Oxygen Therapy: Patient Spontanous Breathing and Patient connected to nasal cannula oxygen  Post-op Assessment: Report given to RN and Post -op Vital signs reviewed and stable  Post vital signs: Reviewed  Last Vitals:  Vitals:   06/13/17 1020 06/13/17 1245  BP: (!) 143/78 (!) 152/87  Pulse: (!) 54 72  Resp: 13 18  Temp:      Last Pain:  Vitals:   06/13/17 0854  TempSrc: Oral      Patients Stated Pain Goal: 5 (72/07/21 8288)  Complications: No apparent anesthesia complications

## 2017-06-13 NOTE — H&P (Signed)
Gilles Chiquito  Location: [facility] Surgery Patient #: [id number] DOB: Aug 08, 1966 Married / Language: English / Race: Asian Female   History of Present Illness  The patient is a 51 year old female who presents for a follow-up for Breast cancer. The patient is a 51 year old Asian female with a 2.4 cm cancer beneath the area along the medial left breast. Her cancer is ER and PR positive and HER-2 negative with a cast 67 of 10%. She returns today to talk about reconstructive options. She is still smoking.   Past Surgical History  Appendectomy   Diagnostic Studies History Mammogram  within last year Pap Smear  1-5 years ago  Allergies  No Known Drug Allergies  Allergies Reconciled   Medication History No Current Medications Medications Reconciled  Social History Alcohol use  Occasional alcohol use. Caffeine use  Coffee, Tea. No drug use  Tobacco use  Current every day smoker.  Family History Colon Cancer  Brother.  Pregnancy / Birth History Age at menarche  32 years. Gravida  2 Irregular periods  Length (months) of breastfeeding  12-24 Maternal age  79-20 Para  1  Other Problems  Back Pain  Breast Cancer  Other disease, cancer, significant illness     Review of Systems  General Present- Fatigue. Not Present- Appetite Loss, Chills, Fever, Night Sweats, Weight Gain and Weight Loss. Skin Not Present- Change in Wart/Mole, Dryness, Hives, Jaundice, New Lesions, Non-Healing Wounds, Rash and Ulcer. HEENT Present- Wears glasses/contact lenses. Not Present- Earache, Hearing Loss, Hoarseness, Nose Bleed, Oral Ulcers, Ringing in the Ears, Seasonal Allergies, Sinus Pain, Sore Throat, Visual Disturbances and Yellow Eyes. Respiratory Present- Snoring. Not Present- Bloody sputum, Chronic Cough, Difficulty Breathing and Wheezing. Breast Present- Breast Mass and Breast Pain. Not Present- Nipple Discharge and Skin Changes. Cardiovascular Not Present-  Chest Pain, Difficulty Breathing Lying Down, Leg Cramps, Palpitations, Rapid Heart Rate, Shortness of Breath and Swelling of Extremities. Gastrointestinal Not Present- Abdominal Pain, Bloating, Bloody Stool, Change in Bowel Habits, Chronic diarrhea, Constipation, Difficulty Swallowing, Excessive gas, Gets full quickly at meals, Hemorrhoids, Indigestion, Nausea, Rectal Pain and Vomiting. Female Genitourinary Not Present- Frequency, Nocturia, Painful Urination, Pelvic Pain and Urgency. Neurological Not Present- Decreased Memory, Fainting, Headaches, Numbness, Seizures, Tingling, Tremor, Trouble walking and Weakness. Psychiatric Not Present- Anxiety, Bipolar, Change in Sleep Pattern, Depression, Fearful and Frequent crying. Endocrine Not Present- Cold Intolerance, Excessive Hunger, Hair Changes, Heat Intolerance, Hot flashes and New Diabetes. Hematology Not Present- Blood Thinners, Easy Bruising, Excessive bleeding, Gland problems, HIV and Persistent Infections.  Vitals  Weight: 130.2 lb Height: 64in Body Surface Area: 1.63 m Body Mass Index: 22.35 kg/m  Temp.: 97.40F  Pulse: 71 (Regular)  BP: 110/56 (Sitting, Left Arm, Standard)       Physical Exam  General Mental Status-Alert. General Appearance-Consistent with stated age. Hydration-Well hydrated. Voice-Normal.  Head and Neck Head-normocephalic, atraumatic with no lesions or palpable masses. Trachea-midline. Thyroid Gland Characteristics - normal size and consistency.  Eye Eyeball - Bilateral-Extraocular movements intact. Sclera/Conjunctiva - Bilateral-No scleral icterus.  Chest and Lung Exam Chest and lung exam reveals -quiet, even and easy respiratory effort with no use of accessory muscles and on auscultation, normal breath sounds, no adventitious sounds and normal vocal resonance. Inspection Chest Wall - Normal. Back - normal.  Breast Note: There is a 3cm palpable mass in the inner left  breast. It is no tethered to the chest wall and there is no skin retraction. There is no palpable axillary, supraclavicular, or cervical lymphadenopathy  Cardiovascular Cardiovascular examination reveals -normal heart sounds, regular rate and rhythm with no murmurs and normal pedal pulses bilaterally.  Abdomen Inspection Inspection of the abdomen reveals - No Hernias. Skin - Scar - no surgical scars. Palpation/Percussion Palpation and Percussion of the abdomen reveal - Soft, Non Tender, No Rebound tenderness, No Rigidity (guarding) and No hepatosplenomegaly. Auscultation Auscultation of the abdomen reveals - Bowel sounds normal.  Neurologic Neurologic evaluation reveals -alert and oriented x 3 with no impairment of recent or remote memory. Mental Status-Normal.  Musculoskeletal Normal Exam - Left-Upper Extremity Strength Normal and Lower Extremity Strength Normal. Normal Exam - Right-Upper Extremity Strength Normal and Lower Extremity Strength Normal.  Lymphatic Head & Neck  General Head & Neck Lymphatics: Bilateral - Description - Normal. Axillary  General Axillary Region: Bilateral - Description - Normal. Tenderness - Non Tender. Femoral & Inguinal  Generalized Femoral & Inguinal Lymphatics: Bilateral - Description - Normal. Tenderness - Non Tender.    Assessment & Plan  MALIGNANT NEOPLASM OF UPPER-INNER QUADRANT OF LEFT BREAST IN FEMALE, ESTROGEN RECEPTOR POSITIVE (C50.212) Impression: The patient has a 2.4 cm cancer in the inner left breast with clinically negative nodes. Because she is still smoking and we would favor a delayed type reconstruction. Because of the size and location of the cancer think she would best served with a left mastectomy and sentinel node mapping. I have discussed with her in detail the risks and benefits of the operation as well as some of the technical aspects and she understands and wishes to proceed.

## 2017-06-13 NOTE — Addendum Note (Signed)
Addendum  created 06/13/17 1643 by Belinda Block, MD   Anesthesia Intra Blocks edited, Child order released for a procedure order, Sign clinical note

## 2017-06-13 NOTE — Anesthesia Procedure Notes (Addendum)
Procedure Name: Intubation Date/Time: 06/13/2017 10:46 AM Performed by: Jenne Campus Pre-anesthesia Checklist: Patient identified, Emergency Drugs available, Suction available and Patient being monitored Patient Re-evaluated:Patient Re-evaluated prior to induction Oxygen Delivery Method: Circle System Utilized Preoxygenation: Pre-oxygenation with 100% oxygen Induction Type: IV induction Ventilation: Mask ventilation without difficulty Laryngoscope Size: Mac and 3 Grade View: Grade II Tube type: Oral Tube size: 7.0 mm Number of attempts: 1 Airway Equipment and Method: Stylet and Oral airway Placement Confirmation: ETT inserted through vocal cords under direct vision,  positive ETCO2 and breath sounds checked- equal and bilateral Secured at: 22 cm Tube secured with: Tape Dental Injury: Teeth and Oropharynx as per pre-operative assessment  Comments: Attempted LMA 4 & 3. Unable to seat LMAs. Placed ETT without difficulty. Performed by Prudence Davidson, SRNA

## 2017-06-13 NOTE — Anesthesia Preprocedure Evaluation (Addendum)
Anesthesia Evaluation  Patient identified by MRN, date of birth, ID band Patient awake    Reviewed: Allergy & Precautions, NPO status , Patient's Chart, lab work & pertinent test results  Airway Mallampati: II  TM Distance: >3 FB     Dental  (+) Teeth Intact, Dental Advisory Given   Pulmonary former smoker,    breath sounds clear to auscultation       Cardiovascular negative cardio ROS   Rhythm:Regular Rate:Normal     Neuro/Psych  Headaches,    GI/Hepatic negative GI ROS, Neg liver ROS,   Endo/Other    Renal/GU negative Renal ROS     Musculoskeletal   Abdominal   Peds  Hematology   Anesthesia Other Findings   Reproductive/Obstetrics                            Anesthesia Physical Anesthesia Plan  ASA: II  Anesthesia Plan: General   Post-op Pain Management:    Induction: Intravenous  PONV Risk Score and Plan: 3 and Ondansetron, Dexamethasone, Midazolam, Propofol infusion and Treatment may vary due to age or medical condition  Airway Management Planned: LMA  Additional Equipment:   Intra-op Plan:   Post-operative Plan: Extubation in OR  Informed Consent: I have reviewed the patients History and Physical, chart, labs and discussed the procedure including the risks, benefits and alternatives for the proposed anesthesia with the patient or authorized representative who has indicated his/her understanding and acceptance.   Dental advisory given  Plan Discussed with: CRNA and Anesthesiologist  Anesthesia Plan Comments:         Anesthesia Quick Evaluation

## 2017-06-13 NOTE — Interval H&P Note (Signed)
History and Physical Interval Note:  06/13/2017 10:17 AM  Kristina Nicholson  has presented today for surgery, with the diagnosis of LEFT BREAST CANCER, 3CM LIPOMA LEFT ARM  The various methods of treatment have been discussed with the patient and family. After consideration of risks, benefits and other options for treatment, the patient has consented to  Procedure(Nicholson): MASTECTOMY WITH SENTINEL LYMPH NODE BIOPSY (Left) EXCISION 3CM LIPOMA ON LEFT ARM (Left) as a surgical intervention .  The patient'Nicholson history has been reviewed, patient examined, no change in status, stable for surgery.  I have reviewed the patient'Nicholson chart and labs.  Questions were answered to the patient'Nicholson satisfaction.     TOTH III,Kristina Nicholson

## 2017-06-14 ENCOUNTER — Encounter (HOSPITAL_COMMUNITY): Payer: Self-pay | Admitting: General Surgery

## 2017-06-14 DIAGNOSIS — M549 Dorsalgia, unspecified: Secondary | ICD-10-CM | POA: Diagnosis not present

## 2017-06-14 DIAGNOSIS — D1722 Benign lipomatous neoplasm of skin and subcutaneous tissue of left arm: Secondary | ICD-10-CM | POA: Diagnosis not present

## 2017-06-14 DIAGNOSIS — Z800 Family history of malignant neoplasm of digestive organs: Secondary | ICD-10-CM | POA: Diagnosis not present

## 2017-06-14 DIAGNOSIS — R51 Headache: Secondary | ICD-10-CM | POA: Diagnosis not present

## 2017-06-14 DIAGNOSIS — F17200 Nicotine dependence, unspecified, uncomplicated: Secondary | ICD-10-CM | POA: Diagnosis not present

## 2017-06-14 DIAGNOSIS — C773 Secondary and unspecified malignant neoplasm of axilla and upper limb lymph nodes: Secondary | ICD-10-CM | POA: Diagnosis not present

## 2017-06-14 DIAGNOSIS — Z853 Personal history of malignant neoplasm of breast: Secondary | ICD-10-CM | POA: Diagnosis not present

## 2017-06-14 DIAGNOSIS — Z170 Estrogen receptor positive status [ER+]: Secondary | ICD-10-CM | POA: Diagnosis not present

## 2017-06-14 DIAGNOSIS — D0512 Intraductal carcinoma in situ of left breast: Secondary | ICD-10-CM | POA: Diagnosis not present

## 2017-06-14 MED ORDER — MENTHOL 3 MG MT LOZG
1.0000 | LOZENGE | OROMUCOSAL | Status: DC | PRN
  Administered 2017-06-14: 3 mg via ORAL
  Filled 2017-06-14: qty 9

## 2017-06-14 MED ORDER — HYDROCODONE-ACETAMINOPHEN 5-325 MG PO TABS: 1.0000 | ORAL_TABLET | ORAL | 0 refills | Status: DC | PRN

## 2017-06-14 NOTE — Progress Notes (Signed)
1 Day Post-Op   Subjective/Chief Complaint: No complaints. No pain. She is emptying her own drain   Objective: Vital signs in last 24 hours: Temp:  [97.4 F (36.3 C)-98.4 F (36.9 C)] 97.7 F (36.5 C) (08/02 0441) Pulse Rate:  [51-73] 69 (08/02 0441) Resp:  [11-20] 17 (08/02 0441) BP: (117-158)/(57-92) 122/71 (08/02 0441) SpO2:  [96 %-100 %] 97 % (08/02 0441) Weight:  [59.4 kg (131 lb)-63.6 kg (140 lb 4.8 oz)] 63.6 kg (140 lb 4.8 oz) (08/01 1350) Last BM Date: 06/12/17  Intake/Output from previous day: 08/01 0701 - 08/02 0700 In: 3116.3 [P.O.:1080; I.V.:2036.3] Out: 4010 [Urine:3850; Drains:145; Blood:15] Intake/Output this shift: No intake/output data recorded.  General appearance: alert and cooperative Resp: clear to auscultation bilaterally Chest wall: skin flaps look healthy Cardio: regular rate and rhythm GI: soft, non-tender; bowel sounds normal; no masses,  no organomegaly  Lab Results:   Recent Labs  06/11/17 1300  WBC 5.5  HGB 12.6  HCT 37.5  PLT 262   BMET  Recent Labs  06/11/17 1300  NA 141  K 4.1  CL 107  CO2 25  GLUCOSE 95  BUN 13  CREATININE 0.79  CALCIUM 9.3   PT/INR No results for input(s): LABPROT, INR in the last 72 hours. ABG No results for input(s): PHART, HCO3 in the last 72 hours.  Invalid input(s): PCO2, PO2  Studies/Results: No results found.  Anti-infectives: Anti-infectives    Start     Dose/Rate Route Frequency Ordered Stop   06/13/17 0835  ceFAZolin (ANCEF) IVPB 2g/100 mL premix     2 g 200 mL/hr over 30 Minutes Intravenous On call to O.R. 06/13/17 0835 06/13/17 1048      Assessment/Plan: s/p Procedure(s): MASTECTOMY WITH SENTINEL LYMPH NODE BIOPSY (Left) EXCISION 3CM LIPOMA ON LEFT ARM (Left) Advance diet Discharge  LOS: 0 days    TOTH III,PAUL S 06/14/2017

## 2017-06-14 NOTE — Progress Notes (Signed)
Discharge home. Home discharge instruction given, no question verbalized. 

## 2017-06-14 NOTE — Discharge Summary (Signed)
Physician Discharge Summary  Patient ID: Kristina Nicholson MRN: [id number] DOB/AGE: 07-09-1966 51 y.o.  Admit date: 06/13/2017 Discharge date: 06/14/2017  Admission Diagnoses:  Discharge Diagnoses:  Active Problems:   Breast cancer of lower-inner quadrant of left female breast [facility])   Discharged Condition: good  Hospital Course: the pt underwent left mastectomy and sentinel node mapping. She tolerated surgery well. On pod 1 she was ready for discharge home  Consults: None  Significant Diagnostic Studies: none  Treatments: surgery: as above  Discharge Exam: Blood pressure 122/71, pulse 69, temperature 97.7 F (36.5 C), temperature source Oral, resp. rate 17, height 5\' 3"  (1.6 m), weight 63.6 kg (140 lb 4.8 oz), last menstrual period 06/22/2015, SpO2 97 %. Chest wall: skin flaps look good  Disposition: Final discharge disposition not confirmed  Discharge Instructions    Call MD for:  difficulty breathing, headache or visual disturbances    Complete by:  As directed    Call MD for:  extreme fatigue    Complete by:  As directed    Call MD for:  hives    Complete by:  As directed    Call MD for:  persistant dizziness or light-headedness    Complete by:  As directed    Call MD for:  persistant nausea and vomiting    Complete by:  As directed    Call MD for:  redness, tenderness, or signs of infection (pain, swelling, redness, odor or green/yellow discharge around incision site)    Complete by:  As directed    Call MD for:  severe uncontrolled pain    Complete by:  As directed    Call MD for:  temperature >100.4    Complete by:  As directed    Diet - low sodium heart healthy    Complete by:  As directed    Discharge instructions    Complete by:  As directed    Sponge bathe while drains are in. No overhead activity. Empty drains twice a day, record output, and recharge bulb. Wear breast binder most of the time   Increase activity slowly    Complete by:  As directed    No  wound care    Complete by:  As directed      Allergies as of 06/14/2017   No Known Allergies     Medication List    TAKE these medications   acetaminophen 500 MG tablet Commonly known as:  TYLENOL Take 500 mg by mouth daily as needed for moderate pain or headache.   benzocaine-Menthol 20-0.5 % Aero Commonly known as:  DERMOPLAST Apply 1 application topically daily as needed for irritation (itching).   HYDROcodone-acetaminophen 5-325 MG tablet Commonly known as:  NORCO/VICODIN Take 1-2 tablets by mouth every 4 (four) hours as needed for moderate pain or severe pain.        Signed: TOTH III,[place]o Passage S 06/14/2017, 8:02 AM

## 2017-06-18 ENCOUNTER — Telehealth: Payer: Self-pay | Admitting: *Deleted

## 2017-06-18 NOTE — Telephone Encounter (Signed)
Received order for Mammaprint testing. Requisition sent to pathology received by [facility]. Sent to [place] as well.

## 2017-06-25 ENCOUNTER — Telehealth: Payer: Self-pay | Admitting: *Deleted

## 2017-06-25 DIAGNOSIS — Z170 Estrogen receptor positive status [ER+]: Principal | ICD-10-CM

## 2017-06-25 DIAGNOSIS — C50212 Malignant neoplasm of upper-inner quadrant of left female breast: Secondary | ICD-10-CM

## 2017-06-25 NOTE — Progress Notes (Signed)
Location of Breast Cancer: Left Breast  Histology per Pathology Report:  04/13/17 Diagnosis Breast, left, needle core biopsy, 9:30 o'clock - INVASIVE DUCTAL CARCINOMA, SEE COMMENT. - DUCTAL CARCINOMA IN SITU.  Receptor Status: ER(95%), PR (NEG), Her2-neu (NEG), Ki-(10%)  06/13/17 Diagnosis 1. Fatty tissue, Left Upper Arm - MATURE ADIPOSE TISSUE, CONSISTENT WITH LIPOMA. 2. Breast, simple mastectomy, Left - INVASIVE DUCTAL CARCINOMA, GRADE I/III, SPANNING 2.5 CM. - DUCTAL CARCINOMA IN SITU, INTERMEDIATE GRADE. - LOBULAR NEOPLASIA (ATYPICAL LOBULAR HYPERPLASIA). - THE SURGICAL RESECTION MARGINS ARE NEGATIVE FOR CARCINOMA. - SEE ONCOLOGY TABLE BELOW. 3. Lymph node, sentinel, biopsy, Left axillary #1 - METASTATIC CARCINOMA IN 1 OF 3 LYMPH NODE (1/3), WITH EXTRACAPSULAR EXTENSION. 4. Lymph node, sentinel, biopsy, Left axillary #2 - THERE IS NO EVIDENCE OF CARCINOMA IN 1 OF 1 LYMPH NODE (0/1). 5. Breast, excision, Inferior and medial margins, left - BENIGN BREAST PARENCHYMA. - THERE IS NO EVIDENCE OF MALIGNANCY.  Did patient present with symptoms or was this found on screening mammography?: She self palpated the mass  Past/Anticipated interventions by surgeon, if any: 06/13/17 PROCEDURE:  Procedure(s): LEFT MASTECTOMY WITH DEEP LEFT AXILLARY SENTINEL LYMPH NODE BIOPSY (Left) EXCISION 3CM LIPOMA ON LEFT ARM (Left) SURGEON:  Surgeon(s) and Role:    Jovita Kussmaul, MD - Primary  Past/Anticipated interventions by medical oncology, if any:  Dr. [facility] 04/25/17 PLAN: -She'll proceed to mastectomy and sentinel lymph node biopsy by Dr. Marlou Starks soon  -We'll send her surgical sample for Oncotype, or mammaprint if node positive. (per note 06/25/17, mammaprint results showed low risk) -Will see her 2-3 weeks after mastectomy to discuss her surgical path and oncotype result   -Dr. Marlou Starks will refer her to Plastic surgeon   Lymphedema issues, if any:  She has some mild edema to her Left  upper arm. Dr. Marlou Starks examined and is aware.   Pain issues, if any: She report pain a 3/10 to her Left Breast incision site   SAFETY ISSUES:  Prior radiation? No  Pacemaker/ICD? No  Possible current pregnancy? No. Her husband tells me that she had a period before her surgery.   Is the patient on methotrexate? No  Current Complaints / other details:    BP 135/80   Pulse 65   Temp 97.7 F (36.5 C)   Ht 5' 3"  (1.6 m)   Wt 132 lb 3.2 oz (60 kg)   LMP 06/22/2015 Comment: 06/08/17 vaginal bleeding for 4 days  SpO2 98%   BMI 23.42 kg/m    Wt Readings from Last 3 Encounters:  07/06/17 132 lb 3.2 oz (60 kg)  06/13/17 140 lb 4.8 oz (63.6 kg)  06/11/17 131 lb 9.6 oz (59.7 kg)      Onica Davidovich, Stephani Police, RN 06/25/2017,3:54 PM

## 2017-06-25 NOTE — Telephone Encounter (Signed)
Received mammaprint results of low risk.  Copy to scan and copy to Dr. Burr Medico.

## 2017-06-26 ENCOUNTER — Encounter: Payer: Self-pay | Admitting: Radiation Oncology

## 2017-06-28 ENCOUNTER — Encounter (HOSPITAL_COMMUNITY): Payer: Self-pay

## 2017-07-02 DIAGNOSIS — Z01419 Encounter for gynecological examination (general) (routine) without abnormal findings: Secondary | ICD-10-CM | POA: Diagnosis not present

## 2017-07-02 DIAGNOSIS — Z6823 Body mass index (BMI) 23.0-23.9, adult: Secondary | ICD-10-CM | POA: Diagnosis not present

## 2017-07-03 ENCOUNTER — Encounter (HOSPITAL_COMMUNITY): Payer: Self-pay

## 2017-07-06 ENCOUNTER — Other Ambulatory Visit: Payer: Self-pay | Admitting: Radiation Oncology

## 2017-07-06 ENCOUNTER — Other Ambulatory Visit (HOSPITAL_COMMUNITY)
Admission: AD | Admit: 2017-07-06 | Discharge: 2017-07-06 | Disposition: A | Discharge status: Home / Self Care | Payer: BLUE CROSS/BLUE SHIELD | Source: Ambulatory Visit | Attending: Radiation Oncology | Admitting: Radiation Oncology

## 2017-07-06 ENCOUNTER — Ambulatory Visit
Admission: RE | Admit: 2017-07-06 | Discharge: 2017-07-06 | Disposition: A | Discharge status: Home / Self Care | Payer: BLUE CROSS/BLUE SHIELD | Source: Ambulatory Visit | Attending: Radiation Oncology | Admitting: Radiation Oncology

## 2017-07-06 ENCOUNTER — Encounter: Payer: Self-pay | Admitting: Radiation Oncology

## 2017-07-06 DIAGNOSIS — Z170 Estrogen receptor positive status [ER+]: Principal | ICD-10-CM

## 2017-07-06 DIAGNOSIS — C773 Secondary and unspecified malignant neoplasm of axilla and upper limb lymph nodes: Secondary | ICD-10-CM | POA: Diagnosis not present

## 2017-07-06 DIAGNOSIS — C50212 Malignant neoplasm of upper-inner quadrant of left female breast: Secondary | ICD-10-CM

## 2017-07-06 DIAGNOSIS — C50312 Malignant neoplasm of lower-inner quadrant of left female breast: Secondary | ICD-10-CM

## 2017-07-06 DIAGNOSIS — Z510 Encounter for antineoplastic radiation therapy: Secondary | ICD-10-CM | POA: Diagnosis not present

## 2017-07-06 DIAGNOSIS — Z9012 Acquired absence of left breast and nipple: Secondary | ICD-10-CM | POA: Diagnosis not present

## 2017-07-06 LAB — PREGNANCY, URINE: Preg Test, Ur: NEGATIVE

## 2017-07-06 NOTE — Progress Notes (Signed)
Radiation Oncology         [phone] ________________________________  Name: Kristina Nicholson MRN: [id number]  Date: 07/06/2017  DOB: June 10, 1966  Follow-Up Visit Note  Outpatient  CC: Patient, No Pcp Per  [name], MD  Diagnosis:      ICD-10-CM   1. Malignant neoplasm of upper-inner quadrant of left breast in female, estrogen receptor positive ([place]) C50.212    Z17.0   Clinical Stage T2 N0 M0  Left Breast LIQ Invasive Ductal Carcinoma and DCIS, ER Positive / PR Negative / Her2 Negative, Grade 2 Pathology stage pT2 pN1a cM0  CHIEF COMPLAINT: Here to discuss management of left breast cancer  Narrative:  The patient returns today for follow-up.     Since consultation, she underwent left mastectomy with Dr. Marlou Starks on 06/13/2017. The tumor was 2.5 cm, Grade 1 with intermediate grade DCIS. Margins are clear by at least 2 mm. 1 of 4 sentinel nodes was positive and contained extracapsular extension. ER 95%, PR negative, Her2 negative, Ki 10%. She underwent Mammaprint testing with a low-risk score. She is still menstruating and had her last period before her surgery. She presents today to discuss adjuvant radiotherapy, followed by starting anti-estrogen therapy on completion of radiation. She is accompanied by her husband, who helped speak on her behalf given the language barrier. They've declined translators.  Denies current sexual activity.  On review of systems, the patient is positive for pain 3/10 to the left breast incision site since her surgery. She also reports left upper arm swelling.           ALLERGIES:  has No Known Allergies.  Meds: Current Outpatient Prescriptions  Medication Sig Dispense Refill  . acetaminophen (TYLENOL) 500 MG tablet Take 500 mg by mouth daily as needed for moderate pain or headache.     . benzocaine-Menthol (DERMOPLAST) 20-0.5 % AERO Apply 1 application topically daily as needed for irritation (itching).    Marland Kitchen HYDROcodone-acetaminophen (NORCO/VICODIN)  5-325 MG tablet Take 1-2 tablets by mouth every 4 (four) hours as needed for moderate pain or severe pain. (Patient not taking: Reported on 07/06/2017) 10 tablet 0   No current facility-administered medications for this encounter.    Review of Systems: As above  Physical Findings:  height is 5' 3"  (1.6 m) and weight is 132 lb 3.2 oz (60 kg). Her temperature is 97.7 F (36.5 C). Her blood pressure is 135/80 and her pulse is 65. Her oxygen saturation is 98%. .    General: Alert and oriented, in no acute distress. Extremities: No obvious lymphedema in her arms. Musculoskeletal: Good ROM in her shoulders with a little stiffness in the left side Neurologic: No obvious focalities. Speech is fluent.  Psychiatric: Judgment and insight are intact. Affect is appropriate. Breast exam reveals well-healing mastectomy scar, mild lymphedema in the UOQ of the left chest wall.  Skin: No concerning changes over the left chest wall.  Lab Findings: Lab Results  Component Value Date   WBC 5.5 06/11/2017   HGB 12.6 06/11/2017   HCT 37.5 06/11/2017   MCV 91.0 06/11/2017   PLT 262 06/11/2017    Radiographic Findings: No results found.  Impression/Plan: Left Breast Cancer We discussed adjuvant radiotherapy today.  I recommend radiotherapy to the left chest wall and regional nodes in order to reduce risk of locoregional recurrence by 2/3.  The risks, benefits and side effects of this treatment were discussed in detail.  She understands that radiotherapy is associated with skin irritation and fatigue  in the acute setting. Late effects can include cosmetic changes and rare injury to internal organs.   She is enthusiastic about proceeding with treatment. A consent form has been signed and placed in her chart. She and her husband requested to have her treatments scheduled in the early morning.  A total of 3 medically necessary complex treatment devices will be fabricated and supervised by me: 2 fields with MLCs  for custom blocks to protect heart, and lungs;  and, a Vac-lok. MORE COMPLEX DEVICES MAY BE MADE IN DOSIMETRY FOR FIELD IN FIELD BEAMS FOR DOSE HOMOGENEITY.  I have requested : 3D Simulation which is medically necessary to give adequate dose to at risk tissues while sparing lungs and heart.  I have requested a DVH of the following structures: lungs, heart, left mastectomy scar.    The patient will receive 50 Gy in 25 fractions to the left chest wall and 45 Gy in 25 fractions to regional nodes with 4 fields.  This will be followed by a boost.  I will order a repeat pregnancy test. Her last one was three weeks ago. I advised the patient and husband to use contraception before and throughout radiotherapy.   I will request a translator in [place] to improve her understanding during her CT simulation and planning process.   I spent 30 minutes face to face with the patient and more than 50% of that time was spent in counseling and/or coordination of care. _____________________________________   Eppie Gibson, MD  This document serves as a record of services personally performed by Eppie Gibson, MD. It was created on her behalf by Rae Lips, a trained medical scribe. The creation of this record is based on the scribe's personal observations and the provider's statements to them. This document has been checked and approved by the attending provider.

## 2017-07-17 ENCOUNTER — Ambulatory Visit
Admission: RE | Admit: 2017-07-17 | Discharge: 2017-07-17 | Disposition: A | Discharge status: Home / Self Care | Payer: BLUE CROSS/BLUE SHIELD | Source: Ambulatory Visit | Attending: Radiation Oncology | Admitting: Radiation Oncology

## 2017-07-17 DIAGNOSIS — Z510 Encounter for antineoplastic radiation therapy: Secondary | ICD-10-CM | POA: Diagnosis not present

## 2017-07-17 DIAGNOSIS — C50312 Malignant neoplasm of lower-inner quadrant of left female breast: Secondary | ICD-10-CM | POA: Diagnosis not present

## 2017-07-17 DIAGNOSIS — C50212 Malignant neoplasm of upper-inner quadrant of left female breast: Secondary | ICD-10-CM | POA: Diagnosis not present

## 2017-07-17 DIAGNOSIS — Z9012 Acquired absence of left breast and nipple: Secondary | ICD-10-CM | POA: Diagnosis not present

## 2017-07-17 DIAGNOSIS — Z170 Estrogen receptor positive status [ER+]: Secondary | ICD-10-CM | POA: Diagnosis not present

## 2017-07-19 ENCOUNTER — Telehealth: Payer: Self-pay | Admitting: Hematology

## 2017-07-19 NOTE — Telephone Encounter (Signed)
Left voicemail for patient regarding her f/u with Dr.Feng on 10/23 per 9/5 sch msg

## 2017-07-24 ENCOUNTER — Ambulatory Visit
Admission: RE | Admit: 2017-07-24 | Payer: BLUE CROSS/BLUE SHIELD | Source: Ambulatory Visit | Admitting: Radiation Oncology

## 2017-07-25 ENCOUNTER — Ambulatory Visit: Payer: BLUE CROSS/BLUE SHIELD

## 2017-07-25 ENCOUNTER — Encounter: Payer: Self-pay | Admitting: Radiation Oncology

## 2017-07-25 ENCOUNTER — Ambulatory Visit
Admission: RE | Admit: 2017-07-25 | Discharge: 2017-07-25 | Disposition: A | Discharge status: Home / Self Care | Payer: BLUE CROSS/BLUE SHIELD | Source: Ambulatory Visit | Attending: Radiation Oncology | Admitting: Radiation Oncology

## 2017-07-25 ENCOUNTER — Ambulatory Visit
Admission: RE | Admit: 2017-07-25 | Payer: BLUE CROSS/BLUE SHIELD | Source: Ambulatory Visit | Admitting: Radiation Oncology

## 2017-07-25 DIAGNOSIS — Z170 Estrogen receptor positive status [ER+]: Secondary | ICD-10-CM | POA: Insufficient documentation

## 2017-07-25 DIAGNOSIS — C50312 Malignant neoplasm of lower-inner quadrant of left female breast: Secondary | ICD-10-CM | POA: Diagnosis not present

## 2017-07-25 DIAGNOSIS — Z510 Encounter for antineoplastic radiation therapy: Secondary | ICD-10-CM | POA: Insufficient documentation

## 2017-07-25 DIAGNOSIS — C50212 Malignant neoplasm of upper-inner quadrant of left female breast: Secondary | ICD-10-CM

## 2017-07-25 NOTE — Progress Notes (Addendum)
Radiation Oncology         [phone] ________________________________  Name: Kristina Nicholson MRN: [id number]  Date: 07/25/2017  DOB: 1966-01-07  SIMULATION AND TREATMENT PLANNING NOTE // Special treatment procedure  Outpatient  DIAGNOSIS:     ICD-10-CM   1. Malignant neoplasm of upper-inner quadrant of left breast in female, estrogen receptor positive ([place]) C50.212    Z17.0     NARRATIVE:  The patient was brought to the [facility] for a new simulation because the therapists could not reproduce her set up on the [facility].  She had declined a Personnel officer medical advice during the first simulation and it was believed that re-simulating with proper English translation would facilitate reproducible positioning with the breathhold technique.  Although the patient was not very pleased to have the help of a translator, she agreed to one today as we believed it was essential to proper treatment planning.  Identity was confirmed.  All relevant records and images related to the planned course of therapy were reviewed.  The patient freely provided informed written consent to proceed with treatment after reviewing the details related to the planned course of therapy. The consent form was witnessed and verified by the simulation staff.    Then, the patient was set-up in a stable reproducible supine position for radiation therapy with her ipsilateral arm over her head, and her upper body secured in a custom-made Vac-lok device.  CT images were obtained.  Surface markings were placed.  The CT images were loaded into the planning software.    Special treatment procedure:  Special treatment procedure was performed today due to the extra time and effort required by myself to plan and prepare this patient for deep inspiration breath hold technique.  I have determined cardiac sparing to be of benefit to this patient to prevent long term cardiac damage due to radiation of the heart.   Bellows were placed on the patient's abdomen. To facilitate cardiac sparing, the patient was coached by the radiation therapists on breath hold techniques and breathing practice was performed. Practice waveforms were obtained. The patient was then scanned while maintaining breath hold in the treatment position.  This image was then transferred over to the imaging specialist. The imaging specialist then created a fusion of the free breathing and breath hold scans using the chest wall as the stable structure. I personally reviewed the fusion in axial, coronal and sagittal image planes.  Excellent cardiac sparing was obtained.  I felt the patient is an appropriate candidate for breath hold and the patient will be treated as such.  The image fusion was then reviewed with the patient to reinforce the necessity of reproducible breath hold.   TREATMENT PLANNING NOTE: Treatment planning then occurred.  The radiation prescription was entered and confirmed.     A total of 5 medically necessary complex treatment devices were fabricated and supervised by me: 4 fields with MLCs for custom blocks to protect heart, and lungs;  and, a Vac-lok. MORE COMPLEX DEVICES MAY BE MADE IN DOSIMETRY FOR FIELD IN FIELD BEAMS FOR DOSE HOMOGENEITY.  I have requested : 3D Simulation which is medically necessary to give adequate dose to at risk tissues while sparing lungs and heart.  I have requested a DVH of the following structures: lungs, heart, esophagus, spinal cord.    The patient will receive 50 Gy in 25 fractions to the left chest wall and IM nodes and 45 Gy in 25 fractions to the SCV/  PAB region with 4 fields.  This will be followed by a boost.    Optical Surface Tracking Plan:  Since intensity modulated radiotherapy (IMRT) and 3D conformal radiation treatment methods are predicated on accurate and precise positioning for treatment, intrafraction motion monitoring is medically necessary to ensure accurate and safe treatment  delivery. The ability to quantify intrafraction motion without excessive ionizing radiation dose can only be performed with optical surface tracking. Accordingly, surface imaging offers the opportunity to obtain 3D measurements of patient position throughout IMRT and 3D treatments without excessive radiation exposure. I am ordering optical surface tracking for this patient's upcoming course of radiotherapy.  ________________________________   Reference:  Ursula Alert, J, et al. Surface imaging-based analysis of intrafraction motion for breast radiotherapy patients.Journal of [place], n. 6, nov. 2014. ISSN [id number].  Available at: <http://[email or web address]/index.php/jacmp/article/[email or web address]>.    -----------------------------------  Eppie Gibson, MD

## 2017-07-25 NOTE — Progress Notes (Signed)
Radiation Oncology         [phone] ________________________________  Name: Kristina Nicholson MRN: [id number]  Date: 07/17/2017  DOB: 10-12-1966  SIMULATION AND TREATMENT PLANNING NOTE // Special treatment procedure  Outpatient  DIAGNOSIS:     ICD-10-CM   1. Malignant neoplasm of upper-inner quadrant of left breast in female, estrogen receptor positive ([place]) C50.212    Z17.0     NARRATIVE:  The patient was brought to the [facility].  Identity was confirmed.  All relevant records and images related to the planned course of therapy were reviewed.  The patient freely provided informed written consent to proceed with treatment after reviewing the details related to the planned course of therapy. The consent form was witnessed and verified by the simulation staff.    Then, the patient was set-up in a stable reproducible supine position for radiation therapy with her ipsilateral arm over her head, and her upper body secured in a custom-made Vac-lok device.  CT images were obtained.  Surface markings were placed.  The CT images were loaded into the planning software.    Special treatment procedure:  Special treatment procedure was performed today due to the extra time and effort required by myself to plan and prepare this patient for deep inspiration breath hold technique.  I have determined cardiac sparing to be of benefit to this patient to prevent long term cardiac damage due to radiation of the heart.  Bellows were placed on the patient's abdomen. To facilitate cardiac sparing, the patient was coached by the radiation therapists on breath hold techniques and breathing practice was performed. Practice waveforms were obtained. The patient was then scanned while maintaining breath hold in the treatment position.  This image was then transferred over to the imaging specialist. The imaging specialist then created a fusion of the free breathing and breath hold scans using the  chest wall as the stable structure. I personally reviewed the fusion in axial, coronal and sagittal image planes.  Excellent cardiac sparing was obtained.  I felt the patient is an appropriate candidate for breath hold and the patient will be treated as such.  The image fusion was then reviewed with the patient to reinforce the necessity of reproducible breath hold.   TREATMENT PLANNING NOTE: Treatment planning then occurred.  The radiation prescription was entered and confirmed.     A total of 5 medically necessary complex treatment devices were fabricated and supervised by me: 4 fields with MLCs for custom blocks to protect heart, and lungs;  and, a Vac-lok. MORE COMPLEX DEVICES MAY BE MADE IN DOSIMETRY FOR FIELD IN FIELD BEAMS FOR DOSE HOMOGENEITY.  I have requested : 3D Simulation which is medically necessary to give adequate dose to at risk tissues while sparing lungs and heart.  I have requested a DVH of the following structures: lungs, heart, esophagus, spinal cord.    The patient will receive 50 Gy in 25 fractions to the left chest wall and IM nodes and 45 Gy in 25 fractions to the SCV/ PAB region with 4 fields.  This will be followed by a boost.    Optical Surface Tracking Plan:  Since intensity modulated radiotherapy (IMRT) and 3D conformal radiation treatment methods are predicated on accurate and precise positioning for treatment, intrafraction motion monitoring is medically necessary to ensure accurate and safe treatment delivery. The ability to quantify intrafraction motion without excessive ionizing radiation dose can only be performed with optical surface tracking. Accordingly, surface imaging offers  the opportunity to obtain 3D measurements of patient position throughout IMRT and 3D treatments without excessive radiation exposure. I am ordering optical surface tracking for this patient's upcoming course of radiotherapy.  ________________________________   Reference:  Ursula Alert, J, et al. Surface imaging-based analysis of intrafraction motion for breast radiotherapy patients.Journal of [place], n. 6, nov. 2014. ISSN [id number].  Available at: <http://www.[email or web address]/index.php/jacmp/article/view/[email or web address]>.    -----------------------------------  Eppie Gibson, MD

## 2017-07-26 ENCOUNTER — Ambulatory Visit: Payer: BLUE CROSS/BLUE SHIELD

## 2017-07-27 ENCOUNTER — Ambulatory Visit: Payer: BLUE CROSS/BLUE SHIELD

## 2017-07-27 DIAGNOSIS — Z510 Encounter for antineoplastic radiation therapy: Secondary | ICD-10-CM | POA: Diagnosis not present

## 2017-07-27 DIAGNOSIS — Z170 Estrogen receptor positive status [ER+]: Secondary | ICD-10-CM | POA: Diagnosis not present

## 2017-07-27 DIAGNOSIS — C50312 Malignant neoplasm of lower-inner quadrant of left female breast: Secondary | ICD-10-CM | POA: Diagnosis not present

## 2017-07-27 DIAGNOSIS — C50212 Malignant neoplasm of upper-inner quadrant of left female breast: Secondary | ICD-10-CM | POA: Diagnosis not present

## 2017-07-30 ENCOUNTER — Encounter: Payer: Self-pay | Admitting: *Deleted

## 2017-07-30 ENCOUNTER — Ambulatory Visit
Admission: RE | Admit: 2017-07-30 | Payer: BLUE CROSS/BLUE SHIELD | Source: Ambulatory Visit | Admitting: Radiation Oncology

## 2017-07-30 NOTE — Progress Notes (Signed)
On 07-30-17 got paper work to be fill out it was [employer], gave to nursing on 07-30-17

## 2017-07-31 ENCOUNTER — Ambulatory Visit
Admission: RE | Admit: 2017-07-31 | Discharge: 2017-07-31 | Disposition: A | Discharge status: Home / Self Care | Payer: BLUE CROSS/BLUE SHIELD | Source: Ambulatory Visit | Attending: Radiation Oncology | Admitting: Radiation Oncology

## 2017-07-31 DIAGNOSIS — Z170 Estrogen receptor positive status [ER+]: Principal | ICD-10-CM

## 2017-07-31 DIAGNOSIS — Z510 Encounter for antineoplastic radiation therapy: Secondary | ICD-10-CM | POA: Diagnosis not present

## 2017-07-31 DIAGNOSIS — C50212 Malignant neoplasm of upper-inner quadrant of left female breast: Secondary | ICD-10-CM | POA: Diagnosis not present

## 2017-07-31 DIAGNOSIS — C50312 Malignant neoplasm of lower-inner quadrant of left female breast: Secondary | ICD-10-CM | POA: Diagnosis not present

## 2017-07-31 MED ORDER — RADIAPLEXRX EX GEL
Freq: Once | CUTANEOUS | Status: AC
  Administered 2017-07-31: 11:00:00 via TOPICAL

## 2017-07-31 MED ORDER — ALRA NON-METALLIC DEODORANT (RAD-ONC)
1.0000 "application " | Freq: Once | TOPICAL | Status: AC
  Administered 2017-07-31: 1 via TOPICAL

## 2017-07-31 NOTE — Progress Notes (Signed)

## 2017-08-01 ENCOUNTER — Ambulatory Visit
Admission: RE | Admit: 2017-08-01 | Discharge: 2017-08-01 | Disposition: A | Discharge status: Home / Self Care | Payer: BLUE CROSS/BLUE SHIELD | Source: Ambulatory Visit | Attending: Radiation Oncology | Admitting: Radiation Oncology

## 2017-08-01 DIAGNOSIS — C50312 Malignant neoplasm of lower-inner quadrant of left female breast: Secondary | ICD-10-CM | POA: Diagnosis not present

## 2017-08-01 DIAGNOSIS — Z170 Estrogen receptor positive status [ER+]: Secondary | ICD-10-CM | POA: Diagnosis not present

## 2017-08-01 DIAGNOSIS — Z510 Encounter for antineoplastic radiation therapy: Secondary | ICD-10-CM | POA: Diagnosis not present

## 2017-08-01 DIAGNOSIS — C50212 Malignant neoplasm of upper-inner quadrant of left female breast: Secondary | ICD-10-CM | POA: Diagnosis not present

## 2017-08-02 ENCOUNTER — Ambulatory Visit
Admission: RE | Admit: 2017-08-02 | Discharge: 2017-08-02 | Disposition: A | Discharge status: Home / Self Care | Payer: BLUE CROSS/BLUE SHIELD | Source: Ambulatory Visit | Attending: Radiation Oncology | Admitting: Radiation Oncology

## 2017-08-02 DIAGNOSIS — C50212 Malignant neoplasm of upper-inner quadrant of left female breast: Secondary | ICD-10-CM | POA: Diagnosis not present

## 2017-08-02 DIAGNOSIS — C50312 Malignant neoplasm of lower-inner quadrant of left female breast: Secondary | ICD-10-CM | POA: Diagnosis not present

## 2017-08-02 DIAGNOSIS — Z510 Encounter for antineoplastic radiation therapy: Secondary | ICD-10-CM | POA: Diagnosis not present

## 2017-08-02 DIAGNOSIS — Z170 Estrogen receptor positive status [ER+]: Secondary | ICD-10-CM | POA: Diagnosis not present

## 2017-08-03 ENCOUNTER — Ambulatory Visit
Admission: RE | Admit: 2017-08-03 | Discharge: 2017-08-03 | Disposition: A | Discharge status: Home / Self Care | Payer: BLUE CROSS/BLUE SHIELD | Source: Ambulatory Visit | Attending: Radiation Oncology | Admitting: Radiation Oncology

## 2017-08-03 DIAGNOSIS — C50212 Malignant neoplasm of upper-inner quadrant of left female breast: Secondary | ICD-10-CM | POA: Diagnosis not present

## 2017-08-03 DIAGNOSIS — Z510 Encounter for antineoplastic radiation therapy: Secondary | ICD-10-CM | POA: Diagnosis not present

## 2017-08-03 DIAGNOSIS — Z170 Estrogen receptor positive status [ER+]: Secondary | ICD-10-CM | POA: Diagnosis not present

## 2017-08-03 DIAGNOSIS — C50312 Malignant neoplasm of lower-inner quadrant of left female breast: Secondary | ICD-10-CM | POA: Diagnosis not present

## 2017-08-06 ENCOUNTER — Ambulatory Visit
Admission: RE | Admit: 2017-08-06 | Discharge: 2017-08-06 | Disposition: A | Discharge status: Home / Self Care | Payer: BLUE CROSS/BLUE SHIELD | Source: Ambulatory Visit | Attending: Radiation Oncology | Admitting: Radiation Oncology

## 2017-08-06 ENCOUNTER — Encounter: Payer: Self-pay | Admitting: Radiation Oncology

## 2017-08-06 DIAGNOSIS — C50312 Malignant neoplasm of lower-inner quadrant of left female breast: Secondary | ICD-10-CM | POA: Diagnosis not present

## 2017-08-06 DIAGNOSIS — Z170 Estrogen receptor positive status [ER+]: Secondary | ICD-10-CM | POA: Diagnosis not present

## 2017-08-06 DIAGNOSIS — C50212 Malignant neoplasm of upper-inner quadrant of left female breast: Secondary | ICD-10-CM | POA: Diagnosis not present

## 2017-08-06 DIAGNOSIS — Z510 Encounter for antineoplastic radiation therapy: Secondary | ICD-10-CM | POA: Diagnosis not present

## 2017-08-06 NOTE — Progress Notes (Signed)
FMLA paperwork faxed to [facility]. Original given to patient at L2. Copy is scanned.

## 2017-08-07 ENCOUNTER — Ambulatory Visit
Admission: RE | Admit: 2017-08-07 | Discharge: 2017-08-07 | Disposition: A | Discharge status: Home / Self Care | Payer: BLUE CROSS/BLUE SHIELD | Source: Ambulatory Visit | Attending: Radiation Oncology | Admitting: Radiation Oncology

## 2017-08-07 DIAGNOSIS — Z170 Estrogen receptor positive status [ER+]: Secondary | ICD-10-CM | POA: Diagnosis not present

## 2017-08-07 DIAGNOSIS — Z510 Encounter for antineoplastic radiation therapy: Secondary | ICD-10-CM | POA: Diagnosis not present

## 2017-08-07 DIAGNOSIS — C50312 Malignant neoplasm of lower-inner quadrant of left female breast: Secondary | ICD-10-CM | POA: Diagnosis not present

## 2017-08-07 DIAGNOSIS — C50212 Malignant neoplasm of upper-inner quadrant of left female breast: Secondary | ICD-10-CM | POA: Diagnosis not present

## 2017-08-08 ENCOUNTER — Ambulatory Visit
Admission: RE | Admit: 2017-08-08 | Discharge: 2017-08-08 | Disposition: A | Discharge status: Home / Self Care | Payer: BLUE CROSS/BLUE SHIELD | Source: Ambulatory Visit | Attending: Radiation Oncology | Admitting: Radiation Oncology

## 2017-08-08 DIAGNOSIS — C50312 Malignant neoplasm of lower-inner quadrant of left female breast: Secondary | ICD-10-CM | POA: Diagnosis not present

## 2017-08-08 DIAGNOSIS — C50212 Malignant neoplasm of upper-inner quadrant of left female breast: Secondary | ICD-10-CM | POA: Diagnosis not present

## 2017-08-08 DIAGNOSIS — Z510 Encounter for antineoplastic radiation therapy: Secondary | ICD-10-CM | POA: Diagnosis not present

## 2017-08-08 DIAGNOSIS — Z170 Estrogen receptor positive status [ER+]: Secondary | ICD-10-CM | POA: Diagnosis not present

## 2017-08-09 ENCOUNTER — Ambulatory Visit
Admission: RE | Admit: 2017-08-09 | Discharge: 2017-08-09 | Disposition: A | Discharge status: Home / Self Care | Payer: BLUE CROSS/BLUE SHIELD | Source: Ambulatory Visit | Attending: Radiation Oncology | Admitting: Radiation Oncology

## 2017-08-09 DIAGNOSIS — C50312 Malignant neoplasm of lower-inner quadrant of left female breast: Secondary | ICD-10-CM | POA: Diagnosis not present

## 2017-08-09 DIAGNOSIS — C50212 Malignant neoplasm of upper-inner quadrant of left female breast: Secondary | ICD-10-CM | POA: Diagnosis not present

## 2017-08-09 DIAGNOSIS — Z510 Encounter for antineoplastic radiation therapy: Secondary | ICD-10-CM | POA: Diagnosis not present

## 2017-08-09 DIAGNOSIS — Z170 Estrogen receptor positive status [ER+]: Secondary | ICD-10-CM | POA: Diagnosis not present

## 2017-08-10 ENCOUNTER — Ambulatory Visit
Admission: RE | Admit: 2017-08-10 | Discharge: 2017-08-10 | Disposition: A | Discharge status: Home / Self Care | Payer: BLUE CROSS/BLUE SHIELD | Source: Ambulatory Visit | Attending: Radiation Oncology | Admitting: Radiation Oncology

## 2017-08-10 DIAGNOSIS — C50312 Malignant neoplasm of lower-inner quadrant of left female breast: Secondary | ICD-10-CM | POA: Diagnosis not present

## 2017-08-10 DIAGNOSIS — C50212 Malignant neoplasm of upper-inner quadrant of left female breast: Secondary | ICD-10-CM | POA: Diagnosis not present

## 2017-08-10 DIAGNOSIS — Z170 Estrogen receptor positive status [ER+]: Secondary | ICD-10-CM | POA: Diagnosis not present

## 2017-08-10 DIAGNOSIS — Z510 Encounter for antineoplastic radiation therapy: Secondary | ICD-10-CM | POA: Diagnosis not present

## 2017-08-13 ENCOUNTER — Ambulatory Visit
Admission: RE | Admit: 2017-08-13 | Discharge: 2017-08-13 | Disposition: A | Discharge status: Home / Self Care | Payer: BLUE CROSS/BLUE SHIELD | Source: Ambulatory Visit | Attending: Radiation Oncology | Admitting: Radiation Oncology

## 2017-08-13 DIAGNOSIS — C50312 Malignant neoplasm of lower-inner quadrant of left female breast: Secondary | ICD-10-CM | POA: Diagnosis not present

## 2017-08-13 DIAGNOSIS — C50212 Malignant neoplasm of upper-inner quadrant of left female breast: Secondary | ICD-10-CM | POA: Diagnosis not present

## 2017-08-13 DIAGNOSIS — Z170 Estrogen receptor positive status [ER+]: Secondary | ICD-10-CM | POA: Diagnosis not present

## 2017-08-13 DIAGNOSIS — Z510 Encounter for antineoplastic radiation therapy: Secondary | ICD-10-CM | POA: Diagnosis not present

## 2017-08-14 ENCOUNTER — Ambulatory Visit
Admission: RE | Admit: 2017-08-14 | Discharge: 2017-08-14 | Disposition: A | Discharge status: Home / Self Care | Payer: BLUE CROSS/BLUE SHIELD | Source: Ambulatory Visit | Attending: Radiation Oncology | Admitting: Radiation Oncology

## 2017-08-14 DIAGNOSIS — Z510 Encounter for antineoplastic radiation therapy: Secondary | ICD-10-CM | POA: Diagnosis not present

## 2017-08-14 DIAGNOSIS — C50212 Malignant neoplasm of upper-inner quadrant of left female breast: Secondary | ICD-10-CM | POA: Diagnosis not present

## 2017-08-14 DIAGNOSIS — C50312 Malignant neoplasm of lower-inner quadrant of left female breast: Secondary | ICD-10-CM | POA: Diagnosis not present

## 2017-08-14 DIAGNOSIS — Z170 Estrogen receptor positive status [ER+]: Secondary | ICD-10-CM | POA: Diagnosis not present

## 2017-08-15 ENCOUNTER — Ambulatory Visit
Admission: RE | Admit: 2017-08-15 | Discharge: 2017-08-15 | Disposition: A | Discharge status: Home / Self Care | Payer: BLUE CROSS/BLUE SHIELD | Source: Ambulatory Visit | Attending: Radiation Oncology | Admitting: Radiation Oncology

## 2017-08-15 DIAGNOSIS — C50312 Malignant neoplasm of lower-inner quadrant of left female breast: Secondary | ICD-10-CM | POA: Diagnosis not present

## 2017-08-15 DIAGNOSIS — C50212 Malignant neoplasm of upper-inner quadrant of left female breast: Secondary | ICD-10-CM | POA: Diagnosis not present

## 2017-08-15 DIAGNOSIS — Z510 Encounter for antineoplastic radiation therapy: Secondary | ICD-10-CM | POA: Diagnosis not present

## 2017-08-15 DIAGNOSIS — Z170 Estrogen receptor positive status [ER+]: Secondary | ICD-10-CM | POA: Diagnosis not present

## 2017-08-16 ENCOUNTER — Ambulatory Visit
Admission: RE | Admit: 2017-08-16 | Discharge: 2017-08-16 | Disposition: A | Discharge status: Home / Self Care | Payer: BLUE CROSS/BLUE SHIELD | Source: Ambulatory Visit | Attending: Radiation Oncology | Admitting: Radiation Oncology

## 2017-08-16 DIAGNOSIS — C50312 Malignant neoplasm of lower-inner quadrant of left female breast: Secondary | ICD-10-CM | POA: Diagnosis not present

## 2017-08-16 DIAGNOSIS — C50212 Malignant neoplasm of upper-inner quadrant of left female breast: Secondary | ICD-10-CM | POA: Diagnosis not present

## 2017-08-16 DIAGNOSIS — Z510 Encounter for antineoplastic radiation therapy: Secondary | ICD-10-CM | POA: Diagnosis not present

## 2017-08-16 DIAGNOSIS — Z170 Estrogen receptor positive status [ER+]: Secondary | ICD-10-CM | POA: Diagnosis not present

## 2017-08-17 ENCOUNTER — Ambulatory Visit
Admission: RE | Admit: 2017-08-17 | Discharge: 2017-08-17 | Disposition: A | Discharge status: Home / Self Care | Payer: BLUE CROSS/BLUE SHIELD | Source: Ambulatory Visit | Attending: Radiation Oncology | Admitting: Radiation Oncology

## 2017-08-17 DIAGNOSIS — Z170 Estrogen receptor positive status [ER+]: Secondary | ICD-10-CM | POA: Diagnosis not present

## 2017-08-17 DIAGNOSIS — Z510 Encounter for antineoplastic radiation therapy: Secondary | ICD-10-CM | POA: Diagnosis not present

## 2017-08-17 DIAGNOSIS — C50212 Malignant neoplasm of upper-inner quadrant of left female breast: Secondary | ICD-10-CM | POA: Diagnosis not present

## 2017-08-17 DIAGNOSIS — C50312 Malignant neoplasm of lower-inner quadrant of left female breast: Secondary | ICD-10-CM | POA: Diagnosis not present

## 2017-08-20 ENCOUNTER — Ambulatory Visit: Payer: BLUE CROSS/BLUE SHIELD | Admitting: Radiation Oncology

## 2017-08-20 ENCOUNTER — Ambulatory Visit
Admission: RE | Admit: 2017-08-20 | Discharge: 2017-08-20 | Disposition: A | Discharge status: Home / Self Care | Payer: BLUE CROSS/BLUE SHIELD | Source: Ambulatory Visit | Attending: Radiation Oncology | Admitting: Radiation Oncology

## 2017-08-20 DIAGNOSIS — C50312 Malignant neoplasm of lower-inner quadrant of left female breast: Secondary | ICD-10-CM | POA: Diagnosis not present

## 2017-08-20 DIAGNOSIS — Z170 Estrogen receptor positive status [ER+]: Secondary | ICD-10-CM | POA: Diagnosis not present

## 2017-08-20 DIAGNOSIS — Z510 Encounter for antineoplastic radiation therapy: Secondary | ICD-10-CM | POA: Diagnosis not present

## 2017-08-20 DIAGNOSIS — C50212 Malignant neoplasm of upper-inner quadrant of left female breast: Secondary | ICD-10-CM | POA: Diagnosis not present

## 2017-08-21 ENCOUNTER — Ambulatory Visit
Admission: RE | Admit: 2017-08-21 | Discharge: 2017-08-21 | Disposition: A | Discharge status: Home / Self Care | Payer: BLUE CROSS/BLUE SHIELD | Source: Ambulatory Visit | Attending: Radiation Oncology | Admitting: Radiation Oncology

## 2017-08-21 DIAGNOSIS — C50212 Malignant neoplasm of upper-inner quadrant of left female breast: Secondary | ICD-10-CM | POA: Diagnosis not present

## 2017-08-21 DIAGNOSIS — Z510 Encounter for antineoplastic radiation therapy: Secondary | ICD-10-CM | POA: Diagnosis not present

## 2017-08-21 DIAGNOSIS — C50312 Malignant neoplasm of lower-inner quadrant of left female breast: Secondary | ICD-10-CM | POA: Diagnosis not present

## 2017-08-21 DIAGNOSIS — Z170 Estrogen receptor positive status [ER+]: Secondary | ICD-10-CM | POA: Diagnosis not present

## 2017-08-22 ENCOUNTER — Ambulatory Visit
Admission: RE | Admit: 2017-08-22 | Discharge: 2017-08-22 | Disposition: A | Discharge status: Home / Self Care | Payer: BLUE CROSS/BLUE SHIELD | Source: Ambulatory Visit | Attending: Radiation Oncology | Admitting: Radiation Oncology

## 2017-08-22 DIAGNOSIS — C50312 Malignant neoplasm of lower-inner quadrant of left female breast: Secondary | ICD-10-CM | POA: Diagnosis not present

## 2017-08-22 DIAGNOSIS — Z510 Encounter for antineoplastic radiation therapy: Secondary | ICD-10-CM | POA: Diagnosis not present

## 2017-08-22 DIAGNOSIS — Z170 Estrogen receptor positive status [ER+]: Secondary | ICD-10-CM | POA: Diagnosis not present

## 2017-08-22 DIAGNOSIS — C50212 Malignant neoplasm of upper-inner quadrant of left female breast: Secondary | ICD-10-CM | POA: Diagnosis not present

## 2017-08-23 ENCOUNTER — Ambulatory Visit
Admission: RE | Admit: 2017-08-23 | Discharge: 2017-08-23 | Disposition: A | Discharge status: Home / Self Care | Payer: BLUE CROSS/BLUE SHIELD | Source: Ambulatory Visit | Attending: Radiation Oncology | Admitting: Radiation Oncology

## 2017-08-23 DIAGNOSIS — C50212 Malignant neoplasm of upper-inner quadrant of left female breast: Secondary | ICD-10-CM | POA: Diagnosis not present

## 2017-08-23 DIAGNOSIS — Z170 Estrogen receptor positive status [ER+]: Secondary | ICD-10-CM | POA: Diagnosis not present

## 2017-08-23 DIAGNOSIS — Z510 Encounter for antineoplastic radiation therapy: Secondary | ICD-10-CM | POA: Diagnosis not present

## 2017-08-23 DIAGNOSIS — C50312 Malignant neoplasm of lower-inner quadrant of left female breast: Secondary | ICD-10-CM | POA: Diagnosis not present

## 2017-08-24 ENCOUNTER — Ambulatory Visit
Admission: RE | Admit: 2017-08-24 | Discharge: 2017-08-24 | Disposition: A | Discharge status: Home / Self Care | Payer: BLUE CROSS/BLUE SHIELD | Source: Ambulatory Visit | Attending: Radiation Oncology | Admitting: Radiation Oncology

## 2017-08-24 DIAGNOSIS — C50212 Malignant neoplasm of upper-inner quadrant of left female breast: Secondary | ICD-10-CM | POA: Diagnosis not present

## 2017-08-24 DIAGNOSIS — C50312 Malignant neoplasm of lower-inner quadrant of left female breast: Secondary | ICD-10-CM | POA: Diagnosis not present

## 2017-08-24 DIAGNOSIS — Z510 Encounter for antineoplastic radiation therapy: Secondary | ICD-10-CM | POA: Diagnosis not present

## 2017-08-24 DIAGNOSIS — Z170 Estrogen receptor positive status [ER+]: Secondary | ICD-10-CM | POA: Diagnosis not present

## 2017-08-27 ENCOUNTER — Ambulatory Visit
Admission: RE | Admit: 2017-08-27 | Discharge: 2017-08-27 | Disposition: A | Discharge status: Home / Self Care | Payer: BLUE CROSS/BLUE SHIELD | Source: Ambulatory Visit | Attending: Radiation Oncology | Admitting: Radiation Oncology

## 2017-08-27 DIAGNOSIS — Z170 Estrogen receptor positive status [ER+]: Principal | ICD-10-CM

## 2017-08-27 DIAGNOSIS — C50312 Malignant neoplasm of lower-inner quadrant of left female breast: Secondary | ICD-10-CM | POA: Diagnosis not present

## 2017-08-27 DIAGNOSIS — Z510 Encounter for antineoplastic radiation therapy: Secondary | ICD-10-CM | POA: Diagnosis not present

## 2017-08-27 DIAGNOSIS — C50212 Malignant neoplasm of upper-inner quadrant of left female breast: Secondary | ICD-10-CM

## 2017-08-27 MED ORDER — SONAFINE EX EMUL
1.0000 "application " | Freq: Once | CUTANEOUS | Status: AC
  Administered 2017-08-27: 1 via TOPICAL

## 2017-08-28 ENCOUNTER — Ambulatory Visit
Admission: RE | Admit: 2017-08-28 | Discharge: 2017-08-28 | Disposition: A | Discharge status: Home / Self Care | Payer: BLUE CROSS/BLUE SHIELD | Source: Ambulatory Visit | Attending: Radiation Oncology | Admitting: Radiation Oncology

## 2017-08-28 DIAGNOSIS — Z170 Estrogen receptor positive status [ER+]: Secondary | ICD-10-CM | POA: Diagnosis not present

## 2017-08-28 DIAGNOSIS — C50312 Malignant neoplasm of lower-inner quadrant of left female breast: Secondary | ICD-10-CM | POA: Diagnosis not present

## 2017-08-28 DIAGNOSIS — Z510 Encounter for antineoplastic radiation therapy: Secondary | ICD-10-CM | POA: Diagnosis not present

## 2017-08-28 DIAGNOSIS — C50212 Malignant neoplasm of upper-inner quadrant of left female breast: Secondary | ICD-10-CM | POA: Diagnosis not present

## 2017-08-29 ENCOUNTER — Ambulatory Visit
Admission: RE | Admit: 2017-08-29 | Discharge: 2017-08-29 | Disposition: A | Discharge status: Home / Self Care | Payer: BLUE CROSS/BLUE SHIELD | Source: Ambulatory Visit | Attending: Radiation Oncology | Admitting: Radiation Oncology

## 2017-08-29 ENCOUNTER — Ambulatory Visit: Payer: BLUE CROSS/BLUE SHIELD

## 2017-08-29 DIAGNOSIS — C50212 Malignant neoplasm of upper-inner quadrant of left female breast: Secondary | ICD-10-CM | POA: Diagnosis not present

## 2017-08-29 DIAGNOSIS — C50312 Malignant neoplasm of lower-inner quadrant of left female breast: Secondary | ICD-10-CM | POA: Diagnosis not present

## 2017-08-29 DIAGNOSIS — Z510 Encounter for antineoplastic radiation therapy: Secondary | ICD-10-CM | POA: Diagnosis not present

## 2017-08-29 DIAGNOSIS — Z170 Estrogen receptor positive status [ER+]: Secondary | ICD-10-CM | POA: Diagnosis not present

## 2017-08-30 ENCOUNTER — Ambulatory Visit: Payer: BLUE CROSS/BLUE SHIELD

## 2017-08-30 ENCOUNTER — Ambulatory Visit
Admission: RE | Admit: 2017-08-30 | Discharge: 2017-08-30 | Disposition: A | Discharge status: Home / Self Care | Payer: BLUE CROSS/BLUE SHIELD | Source: Ambulatory Visit | Attending: Radiation Oncology | Admitting: Radiation Oncology

## 2017-08-30 DIAGNOSIS — C50212 Malignant neoplasm of upper-inner quadrant of left female breast: Secondary | ICD-10-CM | POA: Diagnosis not present

## 2017-08-30 DIAGNOSIS — Z510 Encounter for antineoplastic radiation therapy: Secondary | ICD-10-CM | POA: Diagnosis not present

## 2017-08-30 DIAGNOSIS — C50312 Malignant neoplasm of lower-inner quadrant of left female breast: Secondary | ICD-10-CM | POA: Diagnosis not present

## 2017-08-30 DIAGNOSIS — Z170 Estrogen receptor positive status [ER+]: Secondary | ICD-10-CM | POA: Diagnosis not present

## 2017-08-31 ENCOUNTER — Ambulatory Visit: Payer: BLUE CROSS/BLUE SHIELD

## 2017-08-31 ENCOUNTER — Ambulatory Visit
Admission: RE | Admit: 2017-08-31 | Discharge: 2017-08-31 | Disposition: A | Discharge status: Home / Self Care | Payer: BLUE CROSS/BLUE SHIELD | Source: Ambulatory Visit | Attending: Radiation Oncology | Admitting: Radiation Oncology

## 2017-08-31 DIAGNOSIS — Z170 Estrogen receptor positive status [ER+]: Secondary | ICD-10-CM | POA: Diagnosis not present

## 2017-08-31 DIAGNOSIS — C50212 Malignant neoplasm of upper-inner quadrant of left female breast: Secondary | ICD-10-CM | POA: Diagnosis not present

## 2017-08-31 DIAGNOSIS — C50312 Malignant neoplasm of lower-inner quadrant of left female breast: Secondary | ICD-10-CM | POA: Diagnosis not present

## 2017-08-31 DIAGNOSIS — Z510 Encounter for antineoplastic radiation therapy: Secondary | ICD-10-CM | POA: Diagnosis not present

## 2017-09-01 ENCOUNTER — Ambulatory Visit: Payer: BLUE CROSS/BLUE SHIELD

## 2017-09-03 ENCOUNTER — Ambulatory Visit: Payer: BLUE CROSS/BLUE SHIELD

## 2017-09-03 ENCOUNTER — Ambulatory Visit
Admission: RE | Admit: 2017-09-03 | Discharge: 2017-09-03 | Disposition: A | Discharge status: Home / Self Care | Payer: BLUE CROSS/BLUE SHIELD | Source: Ambulatory Visit | Attending: Radiation Oncology | Admitting: Radiation Oncology

## 2017-09-03 DIAGNOSIS — Z510 Encounter for antineoplastic radiation therapy: Secondary | ICD-10-CM | POA: Diagnosis not present

## 2017-09-03 DIAGNOSIS — C50312 Malignant neoplasm of lower-inner quadrant of left female breast: Secondary | ICD-10-CM | POA: Diagnosis not present

## 2017-09-03 DIAGNOSIS — Z170 Estrogen receptor positive status [ER+]: Secondary | ICD-10-CM | POA: Diagnosis not present

## 2017-09-03 DIAGNOSIS — C50212 Malignant neoplasm of upper-inner quadrant of left female breast: Secondary | ICD-10-CM | POA: Diagnosis not present

## 2017-09-03 NOTE — Progress Notes (Signed)
[place]  [phone] [phone]  Clinic Follow Up Note   Patient Care Team: Patient, No Pcp Per as PCP - General ([place]) [name], MD as Consulting Physician (Hematology) Jovita Kussmaul, MD as Consulting Physician (General Surgery) Eppie Gibson, MD as Attending Physician (Radiation Oncology) 09/04/2017  CHIEF COMPLAINTS/PURPOSE OF CONSULTATOIN:  Recent diagnosis of Malignant neoplasm of upper-inner quadrant of left breast in female, estrogen receptor positive  Oncology History   Cancer Staging Malignant neoplasm of upper-inner quadrant of left breast in female, estrogen receptor positive ([place]) Staging form: Breast, AJCC 8th Edition - Clinical stage from 04/13/2017: Stage IIA (cT2, cN0, cM0, G2, ER: Positive, PR: Negative, HER2: Negative) - Signed by [name], MD on 04/25/2017 - Pathologic stage from 06/13/2017: Stage IIB (pT2, pN1a, cM0, G1, ER: Positive, PR: Negative, HER2: Negative) - Signed by [name], MD on 09/04/2017       Malignant neoplasm of upper-inner quadrant of left breast in female, estrogen receptor positive ([place])   04/10/2017 Mammogram    Left breast mammo and US showed an irregular hypoechoic shadowing mass at 9 o'clock 1 cm from the nipple measuring 2.3 x 1 x 2.4 cm. This corresponds well with the mass seen at mammography. No lymphadenopathy seen in the left axilla.      04/18/2017 Initial Diagnosis    Malignant neoplasm of upper-inner quadrant of left breast in female, estrogen receptor positive ([place])     04/22/2017 Receptors her2    Estrogen Receptor: 95%, POSITIVE, STRONG STAINING INTENSITY Progesterone Receptor: 0%, NEGATIVE Proliferation Marker Ki67: 10% HER2 -      04/22/2017 Initial Biopsy    Breast, left, needle core biopsy, 9:30 o'clock - INVASIVE DUCTAL CARCINOMA, G2 - DUCTAL CARCINOMA IN SITU.      06/13/2017 Surgery    Left breast lumpectomy with sentinel lymph node biopsy performed by Dr Marlou Starks.         06/13/2017 Pathology Results    Breast, simple mastectomy, Left - INVASIVE DUCTAL CARCINOMA, GRADE I/III, SPANNING 2.5 CM. - DUCTAL CARCINOMA IN SITU, INTERMEDIATE GRADE. - LOBULAR NEOPLASIA (ATYPICAL LOBULAR HYPERPLASIA). - THE SURGICAL RESECTION MARGINS ARE NEGATIVE FOR CARCINOMA.  Lymph node, sentinel, biopsy, Left axillary #1 - METASTATIC CARCINOMA IN 1 OF 3 LYMPH NODE (1/3), WITH EXTRACAPSULAR EXTENSION.      06/13/2017 Pathology Results    Mammaprint with low risk disease.       08/01/2017 -  Radiation Therapy    The patient began a course of radiotherapy over the left chest wall and axillary area, supervised by Dr Eppie Gibson, MD.       09/04/2017 -  Anti-estrogen oral therapy    Tamoxifen 53m daily        HISTORY OF PRESENTING ILLNESS: 04/25/17 Kristina YGeorgena Spurling51y.o. female presents to the clinic today with her husband. Due to recent diagnosis of Malignant neoplasm of upper-inner quadrant of left breast in female, estrogen receptor positive. [place] that showed evidence of a mass in the left breast. Following was a biopsy that showed DCIS in the left breast.   In the past She reports to having right thigh nodule for over 15 years and nothing has changed. When she was seen by her gynecologist she was using vaginal cream to help with intercourse. She is no longer using cream. She reports to holding her urine as a child through the day had to be catheterized to help her urinate. This  was her second mammogram, the first in 2016 showed an anomaly but she did not have a follow up screening. 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 She will spend 3 month in [place] every 2-3 years. Her son is 35 and she had him when she was 59.  Today she reports to feeling the lump herself for more than 30 years. Every time her period would start her breast would feel tight and after her cycle  the lump would feel softer. She has not felt any growth of the lump. Her husband reports it showing through the skin. She deines any pain, nipple discharge. For over a year she has lower back pain and it worsens when she is sitting low for a while. She saw a doctor when she was in [place] and she was told her pain was related to her kidney. Her husband reports her having trouble urinating on and off for a year. However she has no PCP currently. Husband thinks she wants to get cancer completley removed with mastectomy and get breast reconstruction.   GYN HISTORY  Menarchal: 13 LMP: May 2017 Contraceptive:  HRT: no  G2P1, she has one son who is in [place]   CURRENT THERAPY: adjuvant radiation, Tamoxifen 53m daily pending   INTERVAL HISTORY:  Ms. CAdamsonreturns today for follow up. She is current in the process of finishing radiation and she is overall tolerating this well. She has developed some radiation-related skin changes, but this has overall been well managed. She does plan to undergo breast reconstruction following XRT. She otherwise is doing well and without other complaint.   MEDICAL HISTORY:  Past Medical History:  Diagnosis Date  . Breast mass    lt breast mass ? doesnt know how long  . Headache   . Malignant neoplasm of upper-inner quadrant of left breast in female, estrogen receptor positive ([place] 04/18/2017  . UTI (urinary tract infection)     SURGICAL HISTORY: Past Surgical History:  Procedure Laterality Date  . APPENDECTOMY    . COLONOSCOPY    . LIPOMA EXCISION Left 06/13/2017   Procedure: EXCISION 3CM LIPOMA ON LEFT ARM;  Surgeon: TJovita Kussmaul MD;  [facility]  Service: General;  Laterality: Left;  .Marland KitchenMASTECTOMY W/ SENTINEL NODE BIOPSY Left 06/13/2017   Procedure: MASTECTOMY WITH SENTINEL LYMPH NODE BIOPSY;  Surgeon: TJovita Kussmaul MD;  [facility] [facility]  Service: General;  Laterality: Left;    SOCIAL HISTORY: Social History   Social History  . Marital status:  Married    Spouse name: N/A  . Number of children: N/A  . Years of education: N/A   Occupational History  . Not on file.   Social History Main Topics  . Smoking status: Former Smoker    Packs/day: 0.25    Years: 32.00    Types: Cigarettes    Quit date: 05/24/2017  . Smokeless tobacco: Never Used  . Alcohol use Yes     Comment: occas  . Drug use: No  . Sexual activity: Not on file   Other Topics Concern  . Not on file   Social History Narrative  . No narrative on file    FAMILY HISTORY: Family History  Problem Relation Age of Onset  . Colon cancer Brother     ALLERGIES:  has No Known Allergies.  MEDICATIONS:  Current Outpatient Prescriptions  Medication Sig Dispense Refill  . acetaminophen (TYLENOL) 500  MG tablet Take 500 mg by mouth daily as needed for moderate pain or headache.     . benzocaine-Menthol (DERMOPLAST) 20-0.5 % AERO Apply 1 application topically daily as needed for irritation (itching).    Marland Kitchen HYDROcodone-acetaminophen (NORCO/VICODIN) 5-325 MG tablet Take 1-2 tablets by mouth every 4 (four) hours as needed for moderate pain or severe pain. (Patient not taking: Reported on 07/06/2017) 10 tablet 0   No current facility-administered medications for this visit.     REVIEW OF SYSTEMS:   Constitutional: Denies fevers, chills or abnormal night sweats Eyes: Denies blurriness of vision, double vision or watery eyes Ears, nose, mouth, throat, and face: Denies mucositis or sore throat Respiratory: Denies cough, dyspnea or wheezes Cardiovascular: Denies palpitation, chest discomfort or lower extremity swelling Gastrointestinal:  Denies nausea, heartburn or change in bowel habits (+) nodule on LRQ possibly from appendectomy  Urinary: (+) trouble urinating  Skin: Denies abnormal skin rashes Lymphatics: Denies new lymphadenopathy or easy bruising Neurological:Denies numbness, tingling or new weaknesses MSK: (+) lower back/flank pain  Behavioral/Psych: Mood is  stable, no new changes  All other systems were reviewed with the patient and are negative. Breast: (+) palpable mass of left breast and occasional pain  PHYSICAL EXAMINATION: ECOG PERFORMANCE STATUS: 0 - Asymptomatic  Vitals:   09/04/17 0937  BP: 113/71  Pulse: 72  Resp: 20  Temp: 98.4 F (36.9 C)  SpO2: 99%   Filed Weights   09/04/17 0937  Weight: 136 lb 1.6 oz (61.7 kg)    GENERAL:alert, no distress and comfortable SKIN: skin color, texture, turgor are normal, no rashes or significant lesions (+)  EYES: normal, conjunctiva are pink and non-injected, sclera clear OROPHARYNX:no exudate, no erythema and lips, buccal mucosa, and tongue normal  NECK: supple, thyroid normal size, non-tender, without nodularity LYMPH:  no palpable lymphadenopathy in the cervical, axillary or inguinal LUNGS: clear to auscultation and percussion with normal breathing effort HEART: regular rate & rhythm and no murmurs and no lower extremity edema ABDOMEN:abdomen soft, non-tender and normal bowel sounds  Extremity: (+) subcutaneous nodule right front thigh Musculoskeletal:no cyanosis of digits and no clubbing  PSYCH: alert & oriented x 3 with fluent speech NEURO: no focal motor/sensory deficits Breasts: s/p left Mastectomy, surgical scar has healed well. Significant skin erythema and hyperpigmentation of the left chest and axilla, secondary to radiation. No skin ulcers or blisters. Right breast exam was negative.   LABORATORY DATA:  I have reviewed the data as listed CBC Latest Ref Rng & Units 06/11/2017 04/25/2017 07/26/2015  WBC 4.0 - 10.5 K/uL 5.5 5.0 6.7  Hemoglobin 12.0 - 15.0 g/dL 12.6 13.7 11.7(A)  Hematocrit 36.0 - 46.0 % 37.5 40.5 36.4(A)  Platelets 150 - 400 K/uL 262 254 -    CMP Latest Ref Rng & Units 06/11/2017 04/25/2017 07/26/2015  Glucose 65 - 99 mg/dL 95 118 94  BUN 6 - 20 mg/dL 13 12.0 13  Creatinine 0.44 - 1.00 mg/dL 0.79 0.8 0.65  Sodium 135 - 145 mmol/L 141 142 139  Potassium  3.5 - 5.1 mmol/L 4.1 3.6 3.9  Chloride 101 - 111 mmol/L 107 - 104  CO2 22 - 32 mmol/L 25 25 26   Calcium 8.9 - 10.3 mg/dL 9.3 9.8 8.8  Total Protein 6.4 - 8.3 g/dL - 7.4 6.2  Total Bilirubin 0.20 - 1.20 mg/dL - 0.45 0.5  Alkaline Phos 40 - 150 U/L - 77 45  AST 5 - 34 U/L - 15 12  ALT 0 - 55 U/L -  11 8   PATHOLOGY  Diagnosis 06/13/17 1. Fatty tissue, Left Upper Arm - MATURE ADIPOSE TISSUE, CONSISTENT WITH LIPOMA. 2. Breast, simple mastectomy, Left - INVASIVE DUCTAL CARCINOMA, GRADE I/III, SPANNING 2.5 CM. - DUCTAL CARCINOMA IN SITU, INTERMEDIATE GRADE. - LOBULAR NEOPLASIA (ATYPICAL LOBULAR HYPERPLASIA). - THE SURGICAL RESECTION MARGINS ARE NEGATIVE FOR CARCINOMA. - SEE ONCOLOGY TABLE BELOW. 3. Lymph node, sentinel, biopsy, Left axillary #1 - METASTATIC CARCINOMA IN 1 OF 3 LYMPH NODE (1/3), WITH EXTRACAPSULAR EXTENSION. 4. Lymph node, sentinel, biopsy, Left axillary #2 - THERE IS NO EVIDENCE OF CARCINOMA IN 1 OF 1 LYMPH NODE (0/1). 5. Breast, excision, Inferior and medial margins, left - BENIGN BREAST PARENCHYMA. - THERE IS NO EVIDENCE OF MALIGNANCY. Microscopic Comment 2. BREAST, INVASIVE TUMOR Procedure: Simple mastectomy and axillary lymph node resection Laterality: Left Tumor Size: 2.5 cm (gross measurement) Histologic Type: Ductal Grade: I Tubular Differentiation: 1 Nuclear Pleomorphism: 2 Mitotic Count: 1 Ductal Carcinoma in Situ (DCIS): Present, intermediate grade Extent of Tumor: Confined to breast parenchyma. Margins: Greater than 0.2 cm to all margins Regional Lymph Nodes: Number of Lymph Nodes Examined: 4 1 of 3 FINAL for Silverstein, Ameriah YIN ([id number]) Microscopic Comment(continued) Number of Sentinel Lymph Nodes Examined: 4 Lymph Nodes with Macrometastases: 1 Lymph Nodes with Micrometastases: 0 Lymph Nodes with Isolated Tumor Cells: 0 Breast Prognostic Profile: Case [id number] Estrogen Receptor: 95%, strong Progesterone Receptor: 0% Her2: No  amplification was detected. The ratio was 1.29 Ki-67: 10% Best tumor block for sendout testing: 2C Pathologic Stage Classification (pTNM, AJCC 8th Edition): Primary Tumor (pT): pT2 Regional Lymph Nodes (pN): pN1a Distant Metastases (pM): pMX  Breast, left, needle core biopsy, 9:30 o'clock 04/22/17 - INVASIVE DUCTAL CARCINOMA, G2 - DUCTAL CARCINOMA IN SITU. Estrogen Receptor: 95%, POSITIVE, STRONG STAINING INTENSITY Progesterone Receptor: 0%, NEGATIVE Proliferation Marker Ki67: 10% HER2 -   RADIOGRAPHIC STUDIES: I have personally reviewed the radiological images as listed and agreed with the findings in the report. No results found.  ASSESSMENT & PLAN:  Kristina Nicholson is a 51 Nicholson.o. female who has a history of an appendectomy.   1. Malignant neoplasm of upper-inner quadrant of left breast in female, [name], stage IIB, ER: Positive, PR: Negative, HER2: Negative, Grade 3 ---She underwent left breast mastectomy with SLN biopsy on 06/13/17. Surgical margins were clear, however, one lymph node of the left axillary region did show metastatic carcinoma with extracapsular extension. I discussed the results of this in great detail with her. -Her mammaprint showed low risk disease, adjuvant chemotherapy was not recommended. -given her low risk disease and stage II disease, staging scan is not needed  -She is going to finish adjuvant radiation soon  -Giving the strong ER expression in her perimenopausal status, I recommend adjuvant endocrine therapy with tamoxifen for 10 years. If she becomes postmenopausal in the next few years, I will probably switch her to aromatase inhibitor for additional 5 years. --The potential side effects, which includes but not limited to, hot flash, skin and vaginal dryness, slightly increased risk of cardiovascular disease and cataract, small risk of thrombosis and endometrial cancer, were discussed with her in great details. Preventive strategies for thrombosis,  such as being physically active, using compression stocks, avoid cigarette smoking, etc., were reviewed with her. I also recommend her to follow-up with her gynecologist once a year, and watch for vaginal spotting or bleeding, as a clinically sign of endometrial cancer, etc. She voiced good understanding, and agrees to proceed. Will start after she completes adjuvant breast radiation. -  Alternative adjuvant endocrine therapy, such as aromatase inhibitor and overexpression were discussed with her also, given her age and perimenopause status, I do not strongly feel this is necessary. -We also reviewed the breast cancer surveillance, including annual mammogram, and physical exam.  -I encouraged her to stay active while taking Tamoxifen.  -I encouraged her to have healthy diet and exercise regularly.   2. Chronic dysuria and left frank pain  -She has trouble completely emptying her bladder, which has been chronic for many years -I strongly encourage her to have a PCP to help monitor her health and I offer to refer her if she would like. She declines for now.   3. Smoking cessation  -We discussed health complications with smoking  -I strongly suggest her to stop and she has tried to stop smoking in the past  PLAN: -Flu shot today.  -Begin tamoxifen in 2-3 weeks  -Lab and f/u in 8-10 weeks  No orders of the defined types were placed in this encounter.   All questions were answered. The patient knows to call the clinic with any problems, questions or concerns. I spent 25 minutes counseling the patient face to face. The total time spent in the appointment was 30 minutes and more than 50% was on counseling.  This document serves as a record of services personally performed by [name], MD. It was created on her behalf by Reola Mosher, a trained medical scribe. The creation of this record is based on the scribe's personal observations and the provider's statements to them. This document has  been checked and approved by the attending provider.     [name], MD 09/04/2017

## 2017-09-04 ENCOUNTER — Ambulatory Visit: Payer: BLUE CROSS/BLUE SHIELD

## 2017-09-04 ENCOUNTER — Ambulatory Visit (HOSPITAL_BASED_OUTPATIENT_CLINIC_OR_DEPARTMENT_OTHER): Payer: BLUE CROSS/BLUE SHIELD | Admitting: Hematology

## 2017-09-04 ENCOUNTER — Ambulatory Visit
Admission: RE | Admit: 2017-09-04 | Discharge: 2017-09-04 | Disposition: A | Discharge status: Home / Self Care | Payer: BLUE CROSS/BLUE SHIELD | Source: Ambulatory Visit | Attending: Radiation Oncology | Admitting: Radiation Oncology

## 2017-09-04 VITALS — BP 113/71 | HR 72 | Temp 98.4°F | Resp 20 | Ht 63.0 in | Wt 136.1 lb

## 2017-09-04 DIAGNOSIS — C50212 Malignant neoplasm of upper-inner quadrant of left female breast: Secondary | ICD-10-CM | POA: Diagnosis not present

## 2017-09-04 DIAGNOSIS — Z23 Encounter for immunization: Secondary | ICD-10-CM | POA: Diagnosis not present

## 2017-09-04 DIAGNOSIS — Z510 Encounter for antineoplastic radiation therapy: Secondary | ICD-10-CM | POA: Diagnosis not present

## 2017-09-04 DIAGNOSIS — C773 Secondary and unspecified malignant neoplasm of axilla and upper limb lymph nodes: Secondary | ICD-10-CM

## 2017-09-04 DIAGNOSIS — Z170 Estrogen receptor positive status [ER+]: Secondary | ICD-10-CM | POA: Diagnosis not present

## 2017-09-04 DIAGNOSIS — R300 Dysuria: Secondary | ICD-10-CM | POA: Diagnosis not present

## 2017-09-04 DIAGNOSIS — C50312 Malignant neoplasm of lower-inner quadrant of left female breast: Secondary | ICD-10-CM

## 2017-09-04 MED ORDER — TAMOXIFEN CITRATE 20 MG PO TABS: 20.0000 mg | ORAL_TABLET | Freq: Every day | ORAL | 3 refills | Status: DC

## 2017-09-04 MED ORDER — INFLUENZA VAC SPLIT QUAD 0.5 ML IM SUSY
0.5000 mL | PREFILLED_SYRINGE | Freq: Once | INTRAMUSCULAR | Status: AC
  Administered 2017-09-04: 0.5 mL via INTRAMUSCULAR
  Filled 2017-09-04: qty 0.5

## 2017-09-04 NOTE — Patient Instructions (Signed)
Preventing Influenza, Adult Influenza, more commonly known as "the flu," is a viral infection that mainly affects the respiratory tract. The respiratory tract includes structures that help you breathe, such as the lungs, nose, and throat. The flu causes many common cold symptoms, as well as a high fever and body aches. The flu spreads easily from person to person (is contagious). The flu is most common from December through March. This is called flu season.You can catch the flu virus by:  Breathing in droplets from an infected person's cough or sneeze.  Touching something that was recently contaminated with the virus and then touching your mouth, nose, or eyes.  What can I do to lower my risk? You can decrease your risk of getting the flu by:  Getting a flu shot (influenza vaccination) every year. This is the best way to prevent the flu. A flu shot is recommended for everyone age 6 months and older. ? It is best to get a flu shot in the fall, as soon as it is available. Getting a flu shot during winter or spring instead is still a good idea. Flu season can last into early spring. ? Preventing the flu through vaccination requires getting a new flu shot every year. This is because the flu virus changes slightly (mutates) from one year to the next. Even if a flu shot does not completely protect you from all flu virus mutations, it can reduce the severity of your illness and prevent dangerous complications of the flu. ? If you are pregnant, you can and should get a flu shot. ? If you have had a reaction to the shot in the past or if you are allergic to eggs, check with your health care provider before getting a flu shot. ? Sometimes the vaccine is available as a nasal spray. In some years, the nasal spray has not been as effective against the flu virus. Check with your health care provider if you have questions about this.  Practicing good health habits. This is especially important during flu  season. ? Avoid contact with people who are sick with flu or cold symptoms. ? Wash your hands with soap and water often. If soap and water are not available, use hand sanitizer. ? Avoid touching your hands to your face, especially when you have not washed your hands recently. ? Use a disinfectant to clean surfaces at home and at work that may be contaminated with the flu virus. ? Keep your body's disease-fighting system (immune system) in good shape by eating a healthy diet, drinking plenty of fluids, getting enough sleep, and exercising regularly.  If you do get the flu, avoid spreading it to others by:  Staying home until your symptoms have been gone for at least one day.  Covering your mouth and nose with your elbow when you cough or sneeze.  Avoiding close contact with others, especially babies and elderly people.  Why are these changes important? Getting a flu shot and practicing good health habits protects you as well as other people. If you get the flu, your friends, family, and co-workers are also at risk of getting it, because it spreads so easily to others. Each year, about 2 out of every 10 people get the flu. Having the flu can lead to complications, such as pneumonia, ear infection, and sinus infection. The flu also can be deadly, especially for babies, people older than age 65, and people who have serious long-term diseases. How is this treated? Most people recover   from the flu by resting at home and drinking plenty of fluids. However, a prescription antiviral medicine may reduce your flu symptoms and may make your flu go away sooner. This medicine must be started within a few days of getting flu symptoms. You can talk with your health care provider about whether you need an antiviral medicine. Antiviral medicine may be prescribed for people who are at risk for more serious flu symptoms. This includes people who:  Are older than age 65.  Are pregnant.  Have a condition that  makes the flu worse or more dangerous.  Where to find more information:  Centers for Disease Control and Prevention: www.cdc.gov/flu/index.htm  Flu.gov: www.flu.gov/prevention-vaccination  American Academy of Family Physicians: familydoctor.org/familydoctor/en/kids/vaccines/preventing-the-flu.html Contact a health care provider if:  You have influenza and you develop new symptoms.  You have: ? Chest pain. ? Diarrhea. ? A fever.  Your cough gets worse, or you produce more mucus. Summary  The best way to prevent the flu is to get a flu shot every year in the fall.  Even if you get the flu after you have received the yearly vaccine, your flu may be milder and go away sooner because of your flu shot.  If you get the flu, antiviral medicines that are started with a few days of symptoms may reduce your flu symptoms and may make your flu go away sooner.  You can also help prevent the flu by practicing good health habits. This information is not intended to replace advice given to you by your health care provider. Make sure you discuss any questions you have with your health care provider. Document Released: 11/14/2015 Document Revised: 07/08/2016 Document Reviewed: 07/08/2016 Elsevier Interactive Patient Education  2018 Elsevier Inc.  

## 2017-09-05 ENCOUNTER — Ambulatory Visit
Admission: RE | Admit: 2017-09-05 | Discharge: 2017-09-05 | Disposition: A | Discharge status: Home / Self Care | Payer: BLUE CROSS/BLUE SHIELD | Source: Ambulatory Visit | Attending: Radiation Oncology | Admitting: Radiation Oncology

## 2017-09-05 ENCOUNTER — Ambulatory Visit: Payer: BLUE CROSS/BLUE SHIELD

## 2017-09-05 ENCOUNTER — Telehealth: Payer: Self-pay | Admitting: Hematology

## 2017-09-05 ENCOUNTER — Encounter: Payer: Self-pay | Admitting: Hematology

## 2017-09-05 DIAGNOSIS — Z510 Encounter for antineoplastic radiation therapy: Secondary | ICD-10-CM | POA: Diagnosis not present

## 2017-09-05 DIAGNOSIS — Z170 Estrogen receptor positive status [ER+]: Secondary | ICD-10-CM | POA: Diagnosis not present

## 2017-09-05 DIAGNOSIS — C50212 Malignant neoplasm of upper-inner quadrant of left female breast: Secondary | ICD-10-CM | POA: Diagnosis not present

## 2017-09-05 NOTE — Telephone Encounter (Signed)
Spoke with patient regarding appts that were scheduled per 10/23 los.

## 2017-09-06 ENCOUNTER — Ambulatory Visit: Payer: BLUE CROSS/BLUE SHIELD

## 2017-09-06 ENCOUNTER — Ambulatory Visit
Admission: RE | Admit: 2017-09-06 | Discharge: 2017-09-06 | Disposition: A | Discharge status: Home / Self Care | Payer: BLUE CROSS/BLUE SHIELD | Source: Ambulatory Visit | Attending: Radiation Oncology | Admitting: Radiation Oncology

## 2017-09-06 DIAGNOSIS — Z510 Encounter for antineoplastic radiation therapy: Secondary | ICD-10-CM | POA: Diagnosis not present

## 2017-09-06 DIAGNOSIS — C50212 Malignant neoplasm of upper-inner quadrant of left female breast: Secondary | ICD-10-CM | POA: Diagnosis not present

## 2017-09-06 DIAGNOSIS — Z170 Estrogen receptor positive status [ER+]: Secondary | ICD-10-CM | POA: Diagnosis not present

## 2017-09-07 ENCOUNTER — Ambulatory Visit
Admission: RE | Admit: 2017-09-07 | Discharge: 2017-09-07 | Disposition: A | Discharge status: Home / Self Care | Payer: BLUE CROSS/BLUE SHIELD | Source: Ambulatory Visit | Attending: Radiation Oncology | Admitting: Radiation Oncology

## 2017-09-07 ENCOUNTER — Ambulatory Visit: Payer: BLUE CROSS/BLUE SHIELD

## 2017-09-07 DIAGNOSIS — Z510 Encounter for antineoplastic radiation therapy: Secondary | ICD-10-CM | POA: Diagnosis not present

## 2017-09-07 DIAGNOSIS — Z170 Estrogen receptor positive status [ER+]: Secondary | ICD-10-CM | POA: Diagnosis not present

## 2017-09-07 DIAGNOSIS — C50212 Malignant neoplasm of upper-inner quadrant of left female breast: Secondary | ICD-10-CM | POA: Diagnosis not present

## 2017-09-10 ENCOUNTER — Encounter: Payer: Self-pay | Admitting: Radiation Oncology

## 2017-09-10 ENCOUNTER — Ambulatory Visit
Admission: RE | Admit: 2017-09-10 | Discharge: 2017-09-10 | Disposition: A | Discharge status: Home / Self Care | Payer: BLUE CROSS/BLUE SHIELD | Source: Ambulatory Visit | Attending: Radiation Oncology | Admitting: Radiation Oncology

## 2017-09-10 DIAGNOSIS — C50212 Malignant neoplasm of upper-inner quadrant of left female breast: Secondary | ICD-10-CM | POA: Diagnosis not present

## 2017-09-10 DIAGNOSIS — Z510 Encounter for antineoplastic radiation therapy: Secondary | ICD-10-CM | POA: Diagnosis not present

## 2017-09-10 DIAGNOSIS — C50312 Malignant neoplasm of lower-inner quadrant of left female breast: Secondary | ICD-10-CM | POA: Diagnosis not present

## 2017-09-10 DIAGNOSIS — Z170 Estrogen receptor positive status [ER+]: Secondary | ICD-10-CM | POA: Diagnosis not present

## 2017-09-11 DIAGNOSIS — M67431 Ganglion, right wrist: Secondary | ICD-10-CM | POA: Diagnosis not present

## 2017-09-11 NOTE — Progress Notes (Signed)
  Radiation Oncology         [phone] ________________________________  Name: Kristina Nicholson MRN: [id number]  Date: 09/10/2017  DOB: 28-May-1966  End of Treatment Note  Diagnosis:   51 y.o. female with Clinical Stage T2 N0 M0 LeftBreast LIQ Invasive Ductal Carcinoma and DCIS, ER Positive/ PR Negative/ Her2 Negative, Grade 2, Pathologic Stage pT2 pN1a cM0  Indication for treatment:     Curative   Radiation treatment dates:   07/31/2017 - 09/10/2017  Site/dose:    1) Left Chest Wall / 50 Gy in 25 fractions 2) Left Supraclavicular / 45 Gy in 25 fractions 3) Left Chest Wall Scar Boost / 10 Gy in 5 fractions  Beams/energy:    1) Opposed tangents 3D/ 10 and 6 MV photons 2) Additional fields 3D / 10 MV and 6 MV photons 3) En face electrons / 6 MeV electrons  Narrative: The patient tolerated radiation treatment relatively well. She experienced mild fatigue. She reported pain, burning, and itching to the left breast and swelling under the left arm. On physical exam, she had dry peeling in the left UIQ of the breast and left axilla, as well as diffuse hyperpigmentation and erythema of the left chest wall. She is using the radiaplex cream to the treatment area and neosporin to the peeling areas.  Plan: The patient has completed radiation treatment. I advised her to continue using neosporin to the peeling areas. The patient will return to radiation oncology clinic for routine followup in one month. I advised them to call or return sooner if they have any questions or concerns related to their recovery or treatment.  -----------------------------------  Eppie Gibson, MD  This document serves as a record of services personally performed by Eppie Gibson, MD. It was created on her behalf by Rae Lips, a trained medical scribe. The creation of this record is based on the scribe's personal observations and the provider's statements to them. This document has been checked and approved  by the attending provider.

## 2017-09-19 ENCOUNTER — Other Ambulatory Visit: Payer: Self-pay | Admitting: Orthopedic Surgery

## 2017-09-19 DIAGNOSIS — M67431 Ganglion, right wrist: Secondary | ICD-10-CM | POA: Diagnosis not present

## 2017-09-20 ENCOUNTER — Other Ambulatory Visit: Payer: Self-pay

## 2017-09-20 ENCOUNTER — Encounter (HOSPITAL_BASED_OUTPATIENT_CLINIC_OR_DEPARTMENT_OTHER): Payer: Self-pay | Admitting: *Deleted

## 2017-09-23 NOTE — H&P (Signed)
Kristina Nicholson is an 51 y.o. female.   CC / Reason for Visit: Right dorsal wrist mass HPI: This patient is a 51 year old RHD female in the midst of treatment for her left breast cancer having undergone a mastectomy and finishing radiation, who presents for evaluation of a right dorsal wrist mass.  She reports that she is not engaged in any chemotherapy, nor will she likely for the type of cancer that she has.  She reports about 5 years ago she had a similar left-sided dorsal wrist mass while in [place], and it was excised there.  The right side has been aspirated twice over the past year in [place], each time with it recurring.  It is painful, worsened with hyperextension loading.   Past Medical History:  Diagnosis Date  . Breast mass    lt breast mass ? doesnt know how long  . Headache   . Malignant neoplasm of upper-inner quadrant of left breast in female, estrogen receptor positive ([facility]) 04/18/2017  . UTI (urinary tract infection)     Past Surgical History:  Procedure Laterality Date  . APPENDECTOMY    . COLONOSCOPY      Family History  Problem Relation Age of Onset  . Colon cancer Brother    Social History:  reports that she quit smoking about 4 months ago. Her smoking use included cigarettes. She has a 8.00 pack-year smoking history. she has never used smokeless tobacco. She reports that she drinks alcohol. She reports that she does not use drugs.  Allergies: No Known Allergies  No medications prior to admission.    No results found for this or any previous visit (from the past 48 hour(s)). No results found.  Review of Systems  All other systems reviewed and are negative.   Height 5\' 3"  (1.6 m), weight 61.2 kg (135 lb), last menstrual period 06/22/2015. Physical Exam  Constitutional:  WD, WN, NAD HEENT:  NCAT, EOMI Neuro/Psych:  Alert & oriented to person, place, and time; appropriate mood & affect Lymphatic: No generalized UE edema or lymphadenopathy Extremities /  MSK:  Both UE are normal with respect to appearance, ranges of motion, joint stability, muscle strength/tone, sensation, & perfusion except as otherwise noted:  Right-sided has a characteristic dorsal central wrist mass consistent with a ganglion, some 5-8 mm in diameter.  It is sore with firm palpation.  Watson's maneuver negative  Labs / Xrays:  3 views of the right wrist ordered and obtained today reveal no fractures, dislocations, or lesions.  Carpal alignment is normal.  Assessment: Right dorsal wrist ganglion, symptomatic despite 2 prior aspirations  Plan:  I discussed these findings with her.  I offered aspiration versus surgical excision.  She is happy with the outcome on the left side, and would not like to proceed with additional aspirations since it has been done twice and has failed to provide resolution each time.  She reports that she will be on any type of chemotherapy that would be immunosuppressive, and would like to proceed as soon as we can reasonably schedule it.  The details of the operative procedure were discussed with the patient.  Questions were invited and answered.  In addition to the goal of the procedure, the risks of the procedure to include but not limited to bleeding; infection; damage to the nerves or blood vessels that could result in bleeding, numbness, weakness, chronic pain, and the need for additional procedures; stiffness; the need for revision surgery; and anesthetic risks were reviewed.  No  specific outcome was guaranteed or implied.  Informed consent was obtained.  Gwenna Fuston A., MD 09/23/2017, 11:38 PM

## 2017-09-24 ENCOUNTER — Encounter (HOSPITAL_BASED_OUTPATIENT_CLINIC_OR_DEPARTMENT_OTHER): Payer: Self-pay | Admitting: Certified Registered"

## 2017-09-24 ENCOUNTER — Ambulatory Visit (HOSPITAL_BASED_OUTPATIENT_CLINIC_OR_DEPARTMENT_OTHER): Payer: BLUE CROSS/BLUE SHIELD | Admitting: Certified Registered"

## 2017-09-24 ENCOUNTER — Other Ambulatory Visit: Payer: Self-pay

## 2017-09-24 ENCOUNTER — Encounter (HOSPITAL_BASED_OUTPATIENT_CLINIC_OR_DEPARTMENT_OTHER)
Admission: RE | Disposition: A | Discharge status: Home / Self Care | Payer: Self-pay | Source: Ambulatory Visit | Attending: Orthopedic Surgery

## 2017-09-24 ENCOUNTER — Ambulatory Visit (HOSPITAL_BASED_OUTPATIENT_CLINIC_OR_DEPARTMENT_OTHER)
Admission: RE | Admit: 2017-09-24 | Discharge: 2017-09-24 | Disposition: A | Discharge status: Home / Self Care | Payer: BLUE CROSS/BLUE SHIELD | Source: Ambulatory Visit | Attending: Orthopedic Surgery | Admitting: Orthopedic Surgery

## 2017-09-24 DIAGNOSIS — Z800 Family history of malignant neoplasm of digestive organs: Secondary | ICD-10-CM | POA: Insufficient documentation

## 2017-09-24 DIAGNOSIS — C50312 Malignant neoplasm of lower-inner quadrant of left female breast: Secondary | ICD-10-CM | POA: Diagnosis not present

## 2017-09-24 DIAGNOSIS — Z170 Estrogen receptor positive status [ER+]: Secondary | ICD-10-CM | POA: Insufficient documentation

## 2017-09-24 DIAGNOSIS — M67431 Ganglion, right wrist: Secondary | ICD-10-CM | POA: Insufficient documentation

## 2017-09-24 DIAGNOSIS — Z87891 Personal history of nicotine dependence: Secondary | ICD-10-CM | POA: Diagnosis not present

## 2017-09-24 DIAGNOSIS — Z87440 Personal history of urinary (tract) infections: Secondary | ICD-10-CM | POA: Insufficient documentation

## 2017-09-24 DIAGNOSIS — Z9049 Acquired absence of other specified parts of digestive tract: Secondary | ICD-10-CM | POA: Diagnosis not present

## 2017-09-24 DIAGNOSIS — Z791 Long term (current) use of non-steroidal anti-inflammatories (NSAID): Secondary | ICD-10-CM | POA: Diagnosis not present

## 2017-09-24 DIAGNOSIS — C50212 Malignant neoplasm of upper-inner quadrant of left female breast: Secondary | ICD-10-CM | POA: Insufficient documentation

## 2017-09-24 DIAGNOSIS — Z79899 Other long term (current) drug therapy: Secondary | ICD-10-CM | POA: Diagnosis not present

## 2017-09-24 HISTORY — PX: GANGLION CYST EXCISION: SHX1691

## 2017-09-24 SURGERY — EXCISION, GANGLION CYST, WRIST
Anesthesia: General | Site: Wrist | Laterality: Right

## 2017-09-24 MED ORDER — LIDOCAINE HCL (CARDIAC) 20 MG/ML IV SOLN
INTRAVENOUS | Status: DC | PRN
  Administered 2017-09-24: 60 mg via INTRAVENOUS

## 2017-09-24 MED ORDER — MIDAZOLAM HCL 2 MG/2ML IJ SOLN
1.0000 mg | INTRAMUSCULAR | Status: DC | PRN
  Administered 2017-09-24: 2 mg via INTRAVENOUS

## 2017-09-24 MED ORDER — LACTATED RINGERS IV SOLN: INTRAVENOUS | Status: DC

## 2017-09-24 MED ORDER — FENTANYL CITRATE (PF) 100 MCG/2ML IJ SOLN
50.0000 ug | INTRAMUSCULAR | Status: DC | PRN
  Administered 2017-09-24 (×2): 50 ug via INTRAVENOUS

## 2017-09-24 MED ORDER — BUPIVACAINE-EPINEPHRINE 0.5% -1:200000 IJ SOLN
INTRAMUSCULAR | Status: DC | PRN
  Administered 2017-09-24: 10 mL

## 2017-09-24 MED ORDER — HYDROMORPHONE HCL 1 MG/ML IJ SOLN
0.2500 mg | INTRAMUSCULAR | Status: DC | PRN
  Administered 2017-09-24: 0.5 mg via INTRAVENOUS

## 2017-09-24 MED ORDER — CEFAZOLIN SODIUM-DEXTROSE 2-4 GM/100ML-% IV SOLN
INTRAVENOUS | Status: AC
  Filled 2017-09-24: qty 100

## 2017-09-24 MED ORDER — EPHEDRINE 5 MG/ML INJ
INTRAVENOUS | Status: AC
  Filled 2017-09-24: qty 10

## 2017-09-24 MED ORDER — CEFAZOLIN SODIUM-DEXTROSE 2-4 GM/100ML-% IV SOLN
2.0000 g | INTRAVENOUS | Status: AC
  Administered 2017-09-24: 2 g via INTRAVENOUS

## 2017-09-24 MED ORDER — OXYCODONE HCL 5 MG PO TABS: 5.0000 mg | ORAL_TABLET | Freq: Four times a day (QID) | ORAL | 0 refills | Status: DC | PRN

## 2017-09-24 MED ORDER — DEXAMETHASONE SODIUM PHOSPHATE 10 MG/ML IJ SOLN
INTRAMUSCULAR | Status: AC
  Filled 2017-09-24: qty 3

## 2017-09-24 MED ORDER — MIDAZOLAM HCL 2 MG/2ML IJ SOLN
INTRAMUSCULAR | Status: AC
  Filled 2017-09-24: qty 2

## 2017-09-24 MED ORDER — ONDANSETRON HCL 4 MG/2ML IJ SOLN
INTRAMUSCULAR | Status: DC | PRN
  Administered 2017-09-24: 4 mg via INTRAVENOUS

## 2017-09-24 MED ORDER — SCOPOLAMINE 1 MG/3DAYS TD PT72
1.0000 | MEDICATED_PATCH | Freq: Once | TRANSDERMAL | Status: DC | PRN
Start: 2017-09-24 — End: 2017-09-24

## 2017-09-24 MED ORDER — ONDANSETRON HCL 4 MG/2ML IJ SOLN
INTRAMUSCULAR | Status: AC
  Filled 2017-09-24: qty 14

## 2017-09-24 MED ORDER — LIDOCAINE 2% (20 MG/ML) 5 ML SYRINGE
INTRAMUSCULAR | Status: AC
  Filled 2017-09-24: qty 20

## 2017-09-24 MED ORDER — ACETAMINOPHEN 325 MG PO TABS: 650.0000 mg | ORAL_TABLET | Freq: Four times a day (QID) | ORAL | Status: DC

## 2017-09-24 MED ORDER — HYDROMORPHONE HCL 1 MG/ML IJ SOLN
INTRAMUSCULAR | Status: AC
  Filled 2017-09-24: qty 0.5

## 2017-09-24 MED ORDER — DEXAMETHASONE SODIUM PHOSPHATE 10 MG/ML IJ SOLN
INTRAMUSCULAR | Status: DC | PRN
  Administered 2017-09-24: 4 mg via INTRAVENOUS

## 2017-09-24 MED ORDER — FENTANYL CITRATE (PF) 100 MCG/2ML IJ SOLN
INTRAMUSCULAR | Status: AC
  Filled 2017-09-24: qty 2

## 2017-09-24 MED ORDER — PROPOFOL 10 MG/ML IV BOLUS
INTRAVENOUS | Status: DC | PRN
  Administered 2017-09-24: 150 mg via INTRAVENOUS

## 2017-09-24 MED ORDER — PROPOFOL 500 MG/50ML IV EMUL
INTRAVENOUS | Status: AC
  Filled 2017-09-24: qty 50

## 2017-09-24 MED ORDER — LACTATED RINGERS IV SOLN
INTRAVENOUS | Status: DC
  Administered 2017-09-24 (×2): via INTRAVENOUS

## 2017-09-24 MED ORDER — PHENYLEPHRINE 40 MCG/ML (10ML) SYRINGE FOR IV PUSH (FOR BLOOD PRESSURE SUPPORT)
PREFILLED_SYRINGE | INTRAVENOUS | Status: AC
  Filled 2017-09-24: qty 10

## 2017-09-24 SURGICAL SUPPLY — 43 items
BENZOIN TINCTURE PRP APPL 2/3 (GAUZE/BANDAGES/DRESSINGS) ×2 IMPLANT
BLADE MINI RND TIP GREEN BEAV (BLADE) IMPLANT
BLADE SURG 15 STRL LF DISP TIS (BLADE) ×1 IMPLANT
BLADE SURG 15 STRL SS (BLADE) ×1
BNDG COHESIVE 4X5 TAN STRL (GAUZE/BANDAGES/DRESSINGS) ×2 IMPLANT
BNDG ESMARK 4X9 LF (GAUZE/BANDAGES/DRESSINGS) IMPLANT
BNDG GAUZE ELAST 4 BULKY (GAUZE/BANDAGES/DRESSINGS) ×2 IMPLANT
BNDG PLASTER X FAST 3X3 WHT LF (CAST SUPPLIES) IMPLANT
CHLORAPREP W/TINT 26ML (MISCELLANEOUS) ×2 IMPLANT
CLSR STERI-STRIP ANTIMIC 1/2X4 (GAUZE/BANDAGES/DRESSINGS) ×2 IMPLANT
CORD BIPOLAR FORCEPS 12FT (ELECTRODE) IMPLANT
COVER BACK TABLE 60X90IN (DRAPES) ×2 IMPLANT
COVER MAYO STAND STRL (DRAPES) ×2 IMPLANT
CUFF TOURNIQUET SINGLE 18IN (TOURNIQUET CUFF) ×2 IMPLANT
DRAPE EXTREMITY T 121X128X90 (DRAPE) ×2 IMPLANT
DRAPE SURG 17X23 STRL (DRAPES) ×2 IMPLANT
DRSG EMULSION OIL 3X3 NADH (GAUZE/BANDAGES/DRESSINGS) ×2 IMPLANT
GAUZE SPONGE 4X4 12PLY STRL (GAUZE/BANDAGES/DRESSINGS) ×2 IMPLANT
GAUZE SPONGE 4X4 12PLY STRL LF (GAUZE/BANDAGES/DRESSINGS) ×2 IMPLANT
GLOVE BIO SURGEON STRL SZ7.5 (GLOVE) ×2 IMPLANT
GLOVE BIOGEL PI IND STRL 7.0 (GLOVE) ×1 IMPLANT
GLOVE BIOGEL PI IND STRL 8 (GLOVE) ×1 IMPLANT
GLOVE BIOGEL PI INDICATOR 7.0 (GLOVE) ×1
GLOVE BIOGEL PI INDICATOR 8 (GLOVE) ×1
GLOVE ECLIPSE 6.5 STRL STRAW (GLOVE) ×2 IMPLANT
GOWN STRL REUS W/ TWL LRG LVL3 (GOWN DISPOSABLE) ×2 IMPLANT
GOWN STRL REUS W/TWL LRG LVL3 (GOWN DISPOSABLE) ×2
GOWN STRL REUS W/TWL XL LVL3 (GOWN DISPOSABLE) ×2 IMPLANT
NEEDLE HYPO 25X1 1.5 SAFETY (NEEDLE) IMPLANT
NS IRRIG 1000ML POUR BTL (IV SOLUTION) ×2 IMPLANT
PACK BASIN DAY SURGERY FS (CUSTOM PROCEDURE TRAY) ×2 IMPLANT
PADDING CAST ABS 4INX4YD NS (CAST SUPPLIES) ×1
PADDING CAST ABS COTTON 4X4 ST (CAST SUPPLIES) ×1 IMPLANT
RUBBERBAND STERILE (MISCELLANEOUS) IMPLANT
STOCKINETTE 6  STRL (DRAPES) ×1
STOCKINETTE 6 STRL (DRAPES) ×1 IMPLANT
SUT VICRYL RAPIDE 4-0 (SUTURE) IMPLANT
SUT VICRYL RAPIDE 4/0 PS 2 (SUTURE) ×2 IMPLANT
SYR 10ML LL (SYRINGE) ×2 IMPLANT
SYR BULB 3OZ (MISCELLANEOUS) IMPLANT
TOWEL OR 17X24 6PK STRL BLUE (TOWEL DISPOSABLE) ×2 IMPLANT
TOWEL OR NON WOVEN STRL DISP B (DISPOSABLE) ×2 IMPLANT
UNDERPAD 30X30 (UNDERPADS AND DIAPERS) ×2 IMPLANT

## 2017-09-24 NOTE — Op Note (Signed)
09/24/2017  8:37 AM  PATIENT:  Kristina Nicholson  51 y.o. female  PRE-OPERATIVE DIAGNOSIS: Right dorsal wrist ganglion  POST-OPERATIVE DIAGNOSIS:  Same  PROCEDURE: Right dorsal wrist ganglion excision  SURGEON: Rayvon Char. Grandville Silos, MD  PHYSICIAN ASSISTANT: None  ANESTHESIA:  general  SPECIMENS:  None--ganglion discarded  DRAINS:   None  EBL:  less than 50 mL  PREOPERATIVE INDICATIONS:  Kristina Nicholson is a  51 y.o. female with right dorsal wrist ganglion, remaining symptomatic despite nonoperative management, including aspirations x2  The risks benefits and alternatives were discussed with the patient preoperatively including but not limited to the risks of infection, bleeding, nerve injury, cardiopulmonary complications, the need for revision surgery, among others, and the patient verbalized understanding and consented to proceed.  OPERATIVE IMPLANTS: None  OPERATIVE PROCEDURE:  After receiving prophylactic antibiotics, the patient was escorted to the operative theatre and placed in a supine position.  General anesthesia was administered.  A surgical "time-out" was performed during which the planned procedure, proposed operative site, and the correct patient identity were compared to the operative consent and agreement confirmed by the circulating nurse according to current facility policy.  Following application of a tourniquet to the operative extremity, the exposed skin was prepped with Chloraprep and draped in the usual sterile fashion.  The limb was exsanguinated with an Esmarch bandage and the tourniquet inflated to approximately 130mmHg higher than systolic BP.  After the incision was marked, the region was anesthetized with half percent Marcaine Baring epinephrine.  A transverse incision was made over the prominence of the mass.  Subcutaneous tissues were dissected with blunt and spreading dissection.  The crossing longitudinal subcutaneous nerves and veins were  protected and retracted.  The mass was encountered, appeared to be typical ganglion.  Dissection was carried out around the periphery of it carefully with scissor dissection, loop assisted magnification, and bipolar electrocautery.  The stalk emanated from the scapholunate interval.  As it was completely excised, there was a swath of dorsal capsule with it.  It was irrigated, rondure used to help further debride its origin at the dorsal scapholunate ligament, which was intact, and then the large capsular hole was closed by pulling the proximal and distal limbs together, still leaving an opening far radially to allow for drainage as needed.  The tourniquet was released, additional hemostasis obtained with bipolar cautery and the skin was closed with 3-0 Vicryl deep dermal buried subcuticular sutures and a running 4-0 Vicryl Rapide subcuticular suture with benzoin and Steri-Strips.  A short arm splint dressing was applied, and she was taken to the recovery room in stable condition, breathing spontaneously  DISPOSITION: She will be discharged home today with verbal instructions, returning in 10-15 days for reevaluation and transition to a removable wrist splint.

## 2017-09-24 NOTE — Anesthesia Procedure Notes (Signed)
Procedure Name: LMA Insertion Date/Time: 09/24/2017 11:00 AM Performed by: Signe Colt, CRNA Pre-anesthesia Checklist: Patient identified, Emergency Drugs available, Suction available and Patient being monitored Patient Re-evaluated:Patient Re-evaluated prior to induction Oxygen Delivery Method: Circle system utilized Preoxygenation: Pre-oxygenation with 100% oxygen Induction Type: IV induction Ventilation: Mask ventilation without difficulty LMA: LMA inserted LMA Size: 3.0 Number of attempts: 1 Airway Equipment and Method: Bite block Placement Confirmation: positive ETCO2 Tube secured with: Tape Dental Injury: Teeth and Oropharynx as per pre-operative assessment

## 2017-09-24 NOTE — Interval H&P Note (Signed)
History and Physical Interval Note:  09/24/2017 8:36 AM  Kristina Nicholson  has presented today for surgery, with the diagnosis of RIGHT DORSAL WRIST GANGLION M67.431  The various methods of treatment have been discussed with the patient and family. After consideration of risks, benefits and other options for treatment, the patient has consented to  Procedure(s): EXCISION OF RIGHT DORSAL WRIST SUSPECTED GANGLION (Right) as a surgical intervention .  The patient's history has been reviewed, patient examined, no change in status, stable for surgery.  I have reviewed the patient's chart and labs.  Questions were answered to the patient's satisfaction.     Shellyann Wandrey A.

## 2017-09-24 NOTE — Anesthesia Preprocedure Evaluation (Signed)
Anesthesia Evaluation  Patient identified by MRN, date of birth, ID band Patient awake    Reviewed: Allergy & Precautions, H&P , NPO status , Patient's Chart, lab work & pertinent test results  Airway Mallampati: III  TM Distance: >3 FB Neck ROM: Full    Dental no notable dental hx. (+) Teeth Intact, Dental Advisory Given   Pulmonary neg pulmonary ROS, former smoker,    Pulmonary exam normal breath sounds clear to auscultation- rhonchi       Cardiovascular negative cardio ROS   Rhythm:Regular Rate:Normal     Neuro/Psych  Headaches, negative psych ROS   GI/Hepatic negative GI ROS, Neg liver ROS,   Endo/Other  negative endocrine ROS  Renal/GU negative Renal ROS  negative genitourinary   Musculoskeletal   Abdominal   Peds  Hematology negative hematology ROS (+)   Anesthesia Other Findings   Reproductive/Obstetrics negative OB ROS                             Anesthesia Physical Anesthesia Plan  ASA: II  Anesthesia Plan: General   Post-op Pain Management:    Induction: Intravenous  PONV Risk Score and Plan: 4 or greater and Ondansetron, Dexamethasone and Midazolam  Airway Management Planned: LMA  Additional Equipment:   Intra-op Plan:   Post-operative Plan: Extubation in OR  Informed Consent: I have reviewed the patients History and Physical, chart, labs and discussed the procedure including the risks, benefits and alternatives for the proposed anesthesia with the patient or authorized representative who has indicated his/her understanding and acceptance.   Dental advisory given  Plan Discussed with: CRNA  Anesthesia Plan Comments:         Anesthesia Quick Evaluation

## 2017-09-24 NOTE — Transfer of Care (Signed)
Immediate Anesthesia Transfer of Care Note  Patient: Kristina Nicholson  Procedure(s) Performed: EXCISION OF RIGHT DORSAL WRIST GANGLION (Right Wrist)  Patient Location: PACU  Anesthesia Type:General  Level of Consciousness: awake and patient cooperative  Airway & Oxygen Therapy: Patient Spontanous Breathing and Patient connected to face mask oxygen  Post-op Assessment: Report given to RN and Post -op Vital signs reviewed and stable  Post vital signs: Reviewed and stable  Last Vitals:  Vitals:   09/24/17 0852 09/24/17 1146  BP: (!) 116/38   Pulse: 65 77  Resp: 18 11  Temp: 36.7 C   SpO2: 98% 100%    Last Pain:  Vitals:   09/24/17 0852  TempSrc: Oral  PainSc: 2       Patients Stated Pain Goal: 1 (16/10/96 0454)  Complications: No apparent anesthesia complications

## 2017-09-24 NOTE — Addendum Note (Signed)
Encounter addended by: Eppie Gibson, MD on: 09/24/2017 10:17 AM  Actions taken: Sign clinical note

## 2017-09-24 NOTE — Discharge Instructions (Addendum)
Discharge Instructions   You have a dressing with a plaster splint incorporated in it. Move your fingers as much as possible, making a full fist and fully opening the fist. Elevate your hand to reduce pain & swelling of the digits.  Ice over the operative site may be helpful to reduce pain & swelling.  DO NOT USE HEAT. Leave the dressing in place until you return to our office.  You may shower, but keep the bandage clean & dry.  You may drive a car when you are off of prescription pain medications and can safely control your vehicle with both hands.   Please call [phone] during normal business hours or [phone] after hours for any problems. Including the following:  - excessive redness of the incisions - drainage for more than 4 days - fever of more than 101.5 F  *Please note that pain medications will not be refilled after hours or on weekends.    Post Anesthesia Home Care Instructions  Activity: Get plenty of rest for the remainder of the day. A responsible individual must stay with you for 24 hours following the procedure.  For the next 24 hours, DO NOT: -Drive a car -Paediatric nurse -Drink alcoholic beverages -Take any medication unless instructed by your physician -Make any legal decisions or sign important papers.  Meals: Start with liquid foods such as gelatin or soup. Progress to regular foods as tolerated. Avoid greasy, spicy, heavy foods. If nausea and/or vomiting occur, drink only clear liquids until the nausea and/or vomiting subsides. Call your physician if vomiting continues.  Special Instructions/Symptoms: Your throat may feel dry or sore from the anesthesia or the breathing tube placed in your throat during surgery. If this causes discomfort, gargle with warm salt water. The discomfort should disappear within 24 hours.  If you had a scopolamine patch placed behind your ear for the management of post- operative nausea and/or vomiting:  1. The  medication in the patch is effective for 72 hours, after which it should be removed.  Wrap patch in a tissue and discard in the trash. Wash hands thoroughly with soap and water. 2. You may remove the patch earlier than 72 hours if you experience unpleasant side effects which may include dry mouth, dizziness or visual disturbances. 3. Avoid touching the patch. Wash your hands with soap and water after contact with the patch.

## 2017-09-25 ENCOUNTER — Encounter (HOSPITAL_BASED_OUTPATIENT_CLINIC_OR_DEPARTMENT_OTHER): Payer: Self-pay | Admitting: Orthopedic Surgery

## 2017-09-25 NOTE — Anesthesia Postprocedure Evaluation (Signed)
Anesthesia Post Note  Patient: Kristina Nicholson  Procedure(s) Performed: EXCISION OF RIGHT DORSAL WRIST GANGLION (Right Wrist)     Patient location during evaluation: PACU Anesthesia Type: General Level of consciousness: awake and alert Pain management: pain level controlled Vital Signs Assessment: post-procedure vital signs reviewed and stable Respiratory status: spontaneous breathing, nonlabored ventilation and respiratory function stable Cardiovascular status: blood pressure returned to baseline and stable Postop Assessment: no apparent nausea or vomiting Anesthetic complications: no    Last Vitals:  Vitals:   09/24/17 1231 09/24/17 1255  BP: (!) 160/97 (!) 170/86  Pulse: 60 62  Resp:  16  Temp:  36.5 C  SpO2: 95% 99%    Last Pain:  Vitals:   09/24/17 1255  TempSrc:   PainSc: 4                  Johny Pitstick,W. EDMOND

## 2017-09-27 ENCOUNTER — Encounter (HOSPITAL_BASED_OUTPATIENT_CLINIC_OR_DEPARTMENT_OTHER): Payer: Self-pay | Admitting: Orthopedic Surgery

## 2017-10-08 DIAGNOSIS — C50912 Malignant neoplasm of unspecified site of left female breast: Secondary | ICD-10-CM | POA: Diagnosis not present

## 2017-10-10 DIAGNOSIS — M67431 Ganglion, right wrist: Secondary | ICD-10-CM | POA: Diagnosis not present

## 2017-10-12 ENCOUNTER — Ambulatory Visit: Payer: Self-pay | Admitting: Radiation Oncology

## 2017-10-15 ENCOUNTER — Encounter: Payer: Self-pay | Admitting: Radiation Oncology

## 2017-10-18 DIAGNOSIS — C50212 Malignant neoplasm of upper-inner quadrant of left female breast: Secondary | ICD-10-CM | POA: Diagnosis not present

## 2017-10-18 DIAGNOSIS — Z170 Estrogen receptor positive status [ER+]: Secondary | ICD-10-CM | POA: Diagnosis not present

## 2017-10-19 ENCOUNTER — Ambulatory Visit
Admission: RE | Admit: 2017-10-19 | Discharge: 2017-10-19 | Disposition: A | Discharge status: Home / Self Care | Payer: BLUE CROSS/BLUE SHIELD | Source: Ambulatory Visit | Attending: Radiation Oncology | Admitting: Radiation Oncology

## 2017-10-19 ENCOUNTER — Encounter: Payer: Self-pay | Admitting: Radiation Oncology

## 2017-10-19 DIAGNOSIS — Z791 Long term (current) use of non-steroidal anti-inflammatories (NSAID): Secondary | ICD-10-CM | POA: Insufficient documentation

## 2017-10-19 DIAGNOSIS — Z923 Personal history of irradiation: Secondary | ICD-10-CM | POA: Insufficient documentation

## 2017-10-19 DIAGNOSIS — Z79810 Long term (current) use of selective estrogen receptor modulators (SERMs): Secondary | ICD-10-CM | POA: Diagnosis not present

## 2017-10-19 DIAGNOSIS — Z170 Estrogen receptor positive status [ER+]: Secondary | ICD-10-CM | POA: Insufficient documentation

## 2017-10-19 DIAGNOSIS — C50212 Malignant neoplasm of upper-inner quadrant of left female breast: Secondary | ICD-10-CM | POA: Diagnosis not present

## 2017-10-19 HISTORY — DX: Personal history of irradiation: Z92.3

## 2017-10-19 NOTE — Progress Notes (Signed)
Kristina Nicholson presents for follow up of radiation completed 09/10/17 to her Left Chest. She reports pain to her Left Chest and axillary area at times. She rates it a 3/10 today. She denies fatigue. Her Left Chest has hyperpigmentation present, she is not currently using a cream. She has tried to purchase Vitamin E cream, but is concerned about soy that is contained in the lotion. She is taking tamoxifen without difficulty and will see Dr. Burr Medico again 10/31/17.   BP 119/70   Pulse 82   Temp 99.6 F (37.6 C)   Ht 5\' 3"  (1.6 m)   Wt 135 lb 3.2 oz (61.3 kg)   LMP 06/22/2015 Comment: 06/08/17 vaginal bleeding for 4 days  SpO2 98% Comment: room air  BMI 23.95 kg/m    Wt Readings from Last 3 Encounters:  10/19/17 135 lb 3.2 oz (61.3 kg)  09/24/17 137 lb (62.1 kg)  09/04/17 136 lb 1.6 oz (61.7 kg)

## 2017-10-19 NOTE — Progress Notes (Signed)
  Radiation Oncology         [phone] ________________________________  Name: Kristina Nicholson MRN: [id number]  Date: 10/19/2017  DOB: [birth year]  Follow-Up Visit Note  Outpatient  CC: Patient, No Pcp Per  Autumn Messing III, MD  Diagnosis and Prior Radiotherapy:    ICD-10-CM   1. Malignant neoplasm of upper-inner quadrant of left breast in female, estrogen receptor positive ([name]) C50.212    Z17.0     CHIEF COMPLAINT: Here for follow-up and surveillance of left breast cancer.  Narrative: The patient returns today for routine follow-up. Pt completed radiation on 09/10/17 to her left chest wall.  The pt presents with her husband. Pt reports that she has itchiness                   ALLERGIES:  has No Known Allergies.  Meds: Current Outpatient Medications  Medication Sig Dispense Refill  . tamoxifen (NOLVADEX) 20 MG tablet   3  . acetaminophen (TYLENOL) 325 MG tablet Take 2 tablets (650 mg total) every 6 (six) hours by mouth. (Patient not taking: Reported on 10/19/2017)    . benzocaine-Menthol (DERMOPLAST) 20-0.5 % AERO Apply 1 application topically daily as needed for irritation (itching).    . meloxicam (MOBIC) 15 MG tablet Take 15 mg daily by mouth.    . oxyCODONE (ROXICODONE) 5 MG immediate release tablet Take 1 tablet (5 mg total) every 6 (six) hours as needed by mouth for severe pain. (Patient not taking: Reported on 10/19/2017) 28 tablet 0   No current facility-administered medications for this encounter.     Physical Findings: The patient is in no acute distress. Patient is alert and oriented.  height is 5\' 3"  (1.6 m) and weight is 135 lb 3.2 oz (61.3 kg). Her temperature is 99.6 F (37.6 C). Her blood pressure is 119/70 and her pulse is 82. Her oxygen saturation is 98%. . Residual but improved hyperpigmentation of the left chest wall.     Lab Findings: Lab Results  Component Value Date   WBC 5.5 06/11/2017   HGB 12.6 06/11/2017   HCT 37.5 06/11/2017   MCV 91.0  06/11/2017   PLT 262 06/11/2017    Radiographic Findings: No results found.  Impression/Plan: healing well   I encouraged her to continue with yearly mammography only on the right side and followup with medical oncology. She was recommended to use 1% hydrocortisone cream and to apply it only where it itches.            I will see her back on an as-needed basis. I have encouraged her to call if she has any issues or concerns in the future. I wished her the very best.   _____________________________________   Eppie Gibson, MD  This document serves as a record of services personally performed by Eppie Gibson, MD. It was created on her behalf by Valeta Harms, a trained medical scribe. The creation of this record is based on the scribe's personal observations and the provider's statements to them. This document has been checked and approved by the attending provider.

## 2017-10-26 ENCOUNTER — Telehealth: Payer: Self-pay | Admitting: Hematology

## 2017-10-26 NOTE — Telephone Encounter (Signed)
Left message for patient regarding upcoming December appointments. Removed interpreter from patients demographics per husbands request.

## 2017-10-26 NOTE — Telephone Encounter (Signed)
Used interpreter line to call patient regarding upcoming December appointments.

## 2017-10-29 DIAGNOSIS — C50912 Malignant neoplasm of unspecified site of left female breast: Secondary | ICD-10-CM | POA: Diagnosis not present

## 2017-10-31 ENCOUNTER — Other Ambulatory Visit: Payer: BLUE CROSS/BLUE SHIELD

## 2017-10-31 ENCOUNTER — Ambulatory Visit: Payer: BLUE CROSS/BLUE SHIELD | Admitting: Hematology

## 2017-11-08 ENCOUNTER — Other Ambulatory Visit: Payer: Self-pay | Admitting: *Deleted

## 2017-11-08 DIAGNOSIS — Z170 Estrogen receptor positive status [ER+]: Principal | ICD-10-CM

## 2017-11-08 DIAGNOSIS — C50212 Malignant neoplasm of upper-inner quadrant of left female breast: Secondary | ICD-10-CM

## 2017-11-09 ENCOUNTER — Other Ambulatory Visit (HOSPITAL_BASED_OUTPATIENT_CLINIC_OR_DEPARTMENT_OTHER): Payer: BLUE CROSS/BLUE SHIELD

## 2017-11-09 ENCOUNTER — Encounter: Payer: Self-pay | Admitting: Hematology

## 2017-11-09 ENCOUNTER — Ambulatory Visit (HOSPITAL_BASED_OUTPATIENT_CLINIC_OR_DEPARTMENT_OTHER): Payer: BLUE CROSS/BLUE SHIELD | Admitting: Hematology

## 2017-11-09 ENCOUNTER — Telehealth: Payer: Self-pay | Admitting: Hematology

## 2017-11-09 VITALS — BP 113/72 | HR 61 | Temp 98.2°F | Resp 18 | Ht 63.0 in | Wt 132.0 lb

## 2017-11-09 DIAGNOSIS — C50212 Malignant neoplasm of upper-inner quadrant of left female breast: Secondary | ICD-10-CM | POA: Diagnosis not present

## 2017-11-09 DIAGNOSIS — Z170 Estrogen receptor positive status [ER+]: Secondary | ICD-10-CM

## 2017-11-09 DIAGNOSIS — Z79810 Long term (current) use of selective estrogen receptor modulators (SERMs): Secondary | ICD-10-CM

## 2017-11-09 DIAGNOSIS — R300 Dysuria: Secondary | ICD-10-CM

## 2017-11-09 LAB — CBC WITH DIFFERENTIAL/PLATELET
BASO%: 0.3 % (ref 0.0–2.0)
BASOS ABS: 0 10*3/uL (ref 0.0–0.1)
EOS%: 1.3 % (ref 0.0–7.0)
Eosinophils Absolute: 0.1 10*3/uL (ref 0.0–0.5)
HCT: 38 % (ref 34.8–46.6)
HGB: 12.6 g/dL (ref 11.6–15.9)
LYMPH%: 36.6 % (ref 14.0–49.7)
MCH: 31 pg (ref 25.1–34.0)
MCHC: 33.2 g/dL (ref 31.5–36.0)
MCV: 93.6 fL (ref 79.5–101.0)
MONO#: 0.4 10*3/uL (ref 0.1–0.9)
MONO%: 10.6 % (ref 0.0–14.0)
NEUT#: 2 10*3/uL (ref 1.5–6.5)
NEUT%: 51.2 % (ref 38.4–76.8)
Platelets: 203 10*3/uL (ref 145–400)
RBC: 4.06 10*6/uL (ref 3.70–5.45)
RDW: 12.8 % (ref 11.2–14.5)
WBC: 3.9 10*3/uL (ref 3.9–10.3)
lymph#: 1.4 10*3/uL (ref 0.9–3.3)

## 2017-11-09 LAB — COMPREHENSIVE METABOLIC PANEL
ALT: 10 U/L (ref 0–55)
ANION GAP: 7 meq/L (ref 3–11)
AST: 14 U/L (ref 5–34)
Albumin: 3.8 g/dL (ref 3.5–5.0)
Alkaline Phosphatase: 57 U/L (ref 40–150)
BILIRUBIN TOTAL: 0.64 mg/dL (ref 0.20–1.20)
BUN: 11.9 mg/dL (ref 7.0–26.0)
CO2: 28 meq/L (ref 22–29)
CREATININE: 0.8 mg/dL (ref 0.6–1.1)
Calcium: 9.1 mg/dL (ref 8.4–10.4)
Chloride: 107 mEq/L (ref 98–109)
EGFR: >60 mL/min/{1.73_m2} (ref >60)
GLUCOSE: 98 mg/dL (ref 70–140)
Potassium: 4.5 mEq/L (ref 3.5–5.1)
Sodium: 141 mEq/L (ref 136–145)
Total Protein: 6.8 g/dL (ref 6.4–8.3)

## 2017-11-09 NOTE — Telephone Encounter (Signed)
Gave patient avs and calendar with appts per 12/28 los °

## 2017-11-09 NOTE — Progress Notes (Signed)
[place]  [phone] [phone]  Clinic Follow Up Note   Patient Care Team: Patient, No Pcp Per as PCP - General (Kristina) [name], MD as Consulting Physician (Hematology) Kristina Kussmaul, MD as Consulting Physician (General Surgery) Eppie Gibson, MD as Attending Physician (Radiation Oncology)   Date of Service:  11/09/2017  CHIEF COMPLAINTS:  Follow up left breast cancer, estrogen receptor positive  Oncology History   Cancer Staging Malignant neoplasm of upper-inner quadrant of left breast in female, estrogen receptor positive ([place]) Staging form: Breast, AJCC 8th Edition - Clinical stage from 04/13/2017: Stage IIA (cT2, cN0, cM0, G2, ER: Positive, PR: Negative, HER2: Negative) - Signed by [name], MD on 04/25/2017 - Pathologic stage from 06/13/2017: Stage IIB (pT2, pN1a, cM0, G1, ER: Positive, PR: Negative, HER2: Negative) - Signed by [name], MD on 09/04/2017       Malignant neoplasm of upper-inner quadrant of left breast in female, estrogen receptor positive ([place])   04/10/2017 Mammogram    Left breast mammo and US showed an irregular hypoechoic shadowing mass at 9 o'clock 1 cm from the nipple measuring 2.3 x 1 x 2.4 cm. This corresponds well with the mass seen at mammography. No lymphadenopathy seen in the left axilla.      04/18/2017 Initial Diagnosis    Malignant neoplasm of upper-inner quadrant of left breast in female, estrogen receptor positive ([place])      04/22/2017 Receptors her2    Estrogen Receptor: 95%, POSITIVE, STRONG STAINING INTENSITY Progesterone Receptor: 0%, NEGATIVE Proliferation Marker Ki67: 10% HER2 -      04/22/2017 Initial Biopsy    Breast, left, needle core biopsy, 9:30 o'clock - INVASIVE DUCTAL CARCINOMA, G2 - DUCTAL CARCINOMA IN SITU.      06/13/2017 Surgery    Left breast mastectomy with sentinel lymph node biopsy performed by Dr Marlou Starks.       06/13/2017 Pathology Results    Breast, simple  mastectomy, Left - INVASIVE DUCTAL CARCINOMA, GRADE I/III, SPANNING 2.5 CM. - DUCTAL CARCINOMA IN SITU, INTERMEDIATE GRADE. - LOBULAR NEOPLASIA (ATYPICAL LOBULAR HYPERPLASIA). - THE SURGICAL RESECTION MARGINS ARE NEGATIVE FOR CARCINOMA.  Lymph node, sentinel, biopsy, Left axillary #1 - METASTATIC CARCINOMA IN 1 OF 3 LYMPH NODE (1/3), WITH EXTRACAPSULAR EXTENSION.      06/13/2017 Pathology Results    Mammaprint with low risk disease.       07/31/2017 - 09/10/2017 Radiation Therapy    The patient began a course of radiotherapy over the left chest wall and axillary area, supervised by Dr Eppie Gibson, MD.    Radiation treatment dates:   07/31/2017 - 09/10/2017  Site/dose:    1) Left Chest Wall / 50 Gy in 25 fractions 2) Left Supraclavicular / 45 Gy in 25 fractions 3) Left Chest Wall Scar Boost / 10 Gy in 5 fractions  Beams/energy:    1) Opposed tangents 3D/ 10 and 6 MV photons 2) Additional fields 3D / 10 MV and 6 MV photons 3) En face electrons / 6 MeV electrons  Narrative: The patient tolerated radiation treatment relatively well. She experienced mild fatigue. She reported pain, burning, and itching to the left breast and swelling under the left arm. On physical exam, she had dry peeling in the left UIQ of the breast and left axilla, as well as diffuse hyperpigmentation and erythema of the left chest wall. She is using the radiaplex cream to the treatment area and neosporin to the peeling areas.  10/13/2017 -  Anti-estrogen oral therapy    Tamoxifen 74m daily        HISTORY OF PRESENTING ILLNESS: 04/25/17 Kristina YGeorgena Spurling51y.o. female presents to the clinic today with her husband. Due to recent diagnosis of Malignant neoplasm of upper-inner quadrant of left breast in female, estrogen receptor positive. She [place] that showed evidence of a mass in the left breast. Following was a biopsy that showed DCIS in the left breast.   In  the past She reports to having right thigh nodule for over 15 years and nothing has changed. When she was seen by her gynecologist she was using vaginal cream to help with intercourse. She is no longer using cream. She reports to holding her urine as a child through the day had to be catheterized to help her urinate. This was her second mammogram, the first in 2016 showed an anomaly but she did not have a follow up screening. 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 Her son is 328and she had him when she was 51  Today she reports to feeling the lump herself for more than 30 years. Every time her period would start her breast would feel tight and after her cycle the lump would feel softer. She has not felt any growth of the lump. Her husband reports it showing through the skin. She denies any pain, nipple discharge. For over a year she has lower back pain and it worsens when she is sitting low for a while. [place] was related to her kidney. Her husband reports her having trouble urinating on and off for a year. However she has no PCP currently. Husband thinks she wants to get cancer completley removed with mastectomy and get breast reconstruction.   GYN HISTORY  Menarchal: 13 LMP: May 2017 Contraceptive:  HRT: no  G2P1, she [place] one son who [place]  CURRENT THERAPY: adjuvant Tamoxifen 53mdaily starting 10/13/17   INTERVAL HISTORY:  [name] today for follow up. She presents to the clinic today accompanied by her husband. She notes she feels tightness and her skin feels itchy. She notes some bumps on her face and has been putting [place] that has helped to clear it up.  53/1/18 after her wrist surgery to remove cyst. She has had only one period since her breast surgery. She shared concern with  taking long term medications.    MEDICAL HISTORY:  Past Medical History:  Diagnosis Date  . Breast mass    lt breast mass ? doesnt know how long  . Headache   . History of radiation therapy 07/31/17- 09/10/17   LEft chest wass 50 gy in 25 fractions, Left Supraclavicular 45 Gy in 25 fractions, Left Chest wall scar boost 10 Gy in 5 fractions.   . Malignant neoplasm of upper-inner quadrant of left breast in female, [place] receptor positive [place]  . UTI (urinary tract infection)     SURGICAL HISTORY: Past Surgical History:  Procedure Laterality Date  . APPENDECTOMY    . COLONOSCOPY    . GANGLION CYST EXCISION Right 09/24/2017   Procedure: EXCISION OF RIGHT DORSAL WRIST GANGLION;  Surgeon: ThMilly JakobMD;  Location: [facility];  Service: Orthopedics;  Laterality: Right;  . LIPOMA EXCISION Left 06/13/2017   Procedure: EXCISION 3CM LIPOMA ON LEFT ARM;  Surgeon: Kristina Kussmaul, MD;  Location: [facility];  Service: General;  Laterality: Left;  Marland Kitchen MASTECTOMY W/ SENTINEL NODE BIOPSY Left 06/13/2017   Procedure: MASTECTOMY WITH SENTINEL LYMPH NODE BIOPSY;  Surgeon: Kristina Kussmaul, MD;  Location: [facility];  Service: General;  Laterality: Left;    SOCIAL HISTORY: Social History   Socioeconomic History  . Marital status: Married    Spouse name: Not on file  . Number of children: Not on file  . Years of education: Not on file  . Highest education level: Not on file  Social Needs  . Financial resource strain: Not on file  . Food insecurity - worry: Not on file  . Food insecurity - inability: Not on file  . Transportation needs - medical: Not on file  . Transportation needs - non-medical: Not on file  Occupational History  . Not on file  Tobacco Use  . Smoking status: Former Smoker    Packs/day: 0.25    Years: 32.00    Pack years: 8.00    Types: Cigarettes    Last attempt to quit: 05/24/2017    Years since quitting: 0.4  . Smokeless tobacco: Never Used  Substance  and Sexual Activity  . Alcohol use: No    Frequency: Never  . Drug use: No  . Sexual activity: Not on file  Other Topics Concern  . Not on file  Social History Narrative  . Not on file    FAMILY HISTORY: Family History  Problem Relation Age of Onset  . Colon cancer Brother     ALLERGIES:  has No Known Allergies.  MEDICATIONS:  Current Outpatient Medications  Medication Sig Dispense Refill  . benzocaine-Menthol (DERMOPLAST) 20-0.5 % AERO Apply 1 application topically daily as needed for irritation (itching).    . meloxicam (MOBIC) 15 MG tablet Take 15 mg daily by mouth.    . tamoxifen (NOLVADEX) 20 MG tablet   3  . acetaminophen (TYLENOL) 325 MG tablet Take 2 tablets (650 mg total) every 6 (six) hours by mouth. (Patient not taking: Reported on 10/19/2017)    . oxyCODONE (ROXICODONE) 5 MG immediate release tablet Take 1 tablet (5 mg total) every 6 (six) hours as needed by mouth for severe pain. (Patient not taking: Reported on 10/19/2017) 28 tablet 0   No current facility-administered medications for this visit.     REVIEW OF SYSTEMS:   Constitutional: Denies fevers, chills or abnormal night sweats Eyes: Denies blurriness of vision, double vision or watery eyes Ears, nose, mouth, throat, and face: Denies mucositis or sore throat Respiratory: Denies cough, dyspnea or wheezes Cardiovascular: Denies palpitation, chest discomfort or lower extremity swelling Gastrointestinal:  Denies nausea, heartburn or change in bowel habits  Skin: Denies abnormal skin rashes Lymphatics: Denies new lymphadenopathy or easy bruising Neurological:Denies numbness, tingling or new weaknesses Behavioral/Psych: Mood is stable, no new changes  All other systems were reviewed with the patient and are negative.   PHYSICAL EXAMINATION: ECOG PERFORMANCE STATUS: 0 - Asymptomatic  Vitals:   11/09/17 1002  BP: 113/72  Pulse: 61  Resp: 18  Temp: 98.2 F (36.8 C)  SpO2: 97%   Filed Weights    11/09/17 1002  Weight: 132 lb (59.9 kg)    GENERAL:alert, no distress and comfortable SKIN: skin color, texture, turgor are normal, no rashes or significant lesions (+)  EYES: normal,  conjunctiva are pink and non-injected, sclera clear OROPHARYNX:no exudate, no erythema and lips, buccal mucosa, and tongue normal  NECK: supple, thyroid normal size, non-tender, without nodularity LYMPH:  no palpable lymphadenopathy in the cervical, axillary or inguinal LUNGS: clear to auscultation and percussion with normal breathing effort HEART: regular rate & rhythm and no murmurs and no lower extremity edema ABDOMEN:abdomen soft, non-tender and normal bowel sounds  Extremity: (+) subcutaneous nodule right front thigh Musculoskeletal:no cyanosis of digits and no clubbing  PSYCH: alert & oriented x 3 with fluent speech NEURO: no focal motor/sensory deficits Breasts: deferred today    LABORATORY DATA:  I have reviewed the data as listed CBC Latest Ref Rng & Units 11/09/2017 06/11/2017 04/25/2017  WBC 3.9 - 10.3 10e3/uL 3.9 5.5 5.0  Hemoglobin 11.6 - 15.9 g/dL 12.6 12.6 13.7  Hematocrit 34.8 - 46.6 % 38.0 37.5 40.5  Platelets 145 - 400 10e3/uL 203 262 254    CMP Latest Ref Rng & Units 11/09/2017 06/11/2017 04/25/2017  Glucose 70 - 140 mg/dl 98 95 118  BUN 7.0 - 26.0 mg/dL 11.9 13 12.0  Creatinine 0.6 - 1.1 mg/dL 0.8 0.79 0.8  Sodium 136 - 145 mEq/L 141 141 142  Potassium 3.5 - 5.1 mEq/L 4.5 4.1 3.6  Chloride 101 - 111 mmol/L - 107 -  CO2 22 - 29 mEq/L _0 Calcium 8.4 - 10.4 mg/dL 9.1 9.3 9.8  Total Protein 6.4 - 8.3 g/dL 6.8 - 7.4  Total Bilirubin 0.20 - 1.20 mg/dL 0.64 - 0.45  Alkaline Phos 40 - 150 U/L 57 - 77  AST 5 - 34 U/L 14 - 15  ALT 0 - 55 U/L 10 - 11   PATHOLOGY  Diagnosis 06/13/17 1. Fatty tissue, Left Upper Arm - MATURE ADIPOSE TISSUE, CONSISTENT WITH LIPOMA. 2. Breast, simple mastectomy, Left - INVASIVE DUCTAL CARCINOMA, GRADE I/III, SPANNING 2.5 CM. - DUCTAL CARCINOMA  IN SITU, INTERMEDIATE GRADE. - LOBULAR NEOPLASIA (ATYPICAL LOBULAR HYPERPLASIA). - THE SURGICAL RESECTION MARGINS ARE NEGATIVE FOR CARCINOMA. - SEE ONCOLOGY TABLE BELOW. 3. Lymph node, sentinel, biopsy, Left axillary #1 - METASTATIC CARCINOMA IN 1 OF 3 LYMPH NODE (1/3), WITH EXTRACAPSULAR EXTENSION. 4. Lymph node, sentinel, biopsy, Left axillary #2 - THERE IS NO EVIDENCE OF CARCINOMA IN 1 OF 1 LYMPH NODE (0/1). 5. Breast, excision, Inferior and medial margins, left - BENIGN BREAST PARENCHYMA. - THERE IS NO EVIDENCE OF MALIGNANCY. Microscopic Comment 2. BREAST, INVASIVE TUMOR Procedure: Simple mastectomy and axillary lymph node resection Laterality: Left Tumor Size: 2.5 cm (gross measurement) Histologic Type: Ductal Grade: I Tubular Differentiation: 1 Nuclear Pleomorphism: 2 Mitotic Count: 1 Ductal Carcinoma in Situ (DCIS): Present, intermediate grade Extent of Tumor: Confined to breast parenchyma. Margins: Greater than 0.2 cm to all margins Regional Lymph Nodes: Number of Lymph Nodes Examined: 4 1 of 3 FINAL for [place], Kristina YIN ([id number]) Microscopic Comment(continued) Number of Sentinel Lymph Nodes Examined: 4 Lymph Nodes with Macrometastases: 1 Lymph Nodes with Micrometastases: 0 Lymph Nodes with Isolated Tumor Cells: 0 Breast Prognostic Profile: Case [id number] Estrogen Receptor: 95%, strong Progesterone Receptor: 0% Her2: No amplification was detected. The ratio was 1.29 Ki-67: 10% Best tumor block for sendout testing: 2C Pathologic Stage Classification (pTNM, AJCC 8th Edition): Primary Tumor (pT): pT2 Regional Lymph Nodes (pN): pN1a Distant Metastases (pM): pMX  Breast, left, needle core biopsy, 9:30 o'clock 04/22/17 - INVASIVE DUCTAL CARCINOMA, G2 - DUCTAL CARCINOMA IN SITU. Estrogen Receptor: 95%, POSITIVE, STRONG STAINING INTENSITY Progesterone Receptor: 0%, NEGATIVE Proliferation Marker Ki67:  10% HER2 -   RADIOGRAPHIC STUDIES: I have  personally reviewed the radiological images as listed and agreed with the findings in the report. No results found.  ASSESSMENT & PLAN:  Kristina Nicholson is a 52 y.o. [place] perimenopausal female who has a history of an appendectomy.   1. Malignant neoplasm of upper-inner quadrant of left breast in female, [name], stage IIB, ER: Positive, PR: Negative, HER2: Negative, Grade 3 ---She underwent left breast mastectomy with SLN biopsy on 06/13/17. Surgical margins were clear, however, one lymph node of the left axillary region did show metastatic carcinoma with extracapsular extension. I discussed the results of this in great detail with her. -Her mammaprint showed low risk disease, adjuvant chemotherapy was not recommended. -given her low risk disease and stage II disease, staging scan is not needed  -She underwent adjuvant radiation with Dr. Isidore Moos on 07/31/17-09/10/17 -Giving the strong ER expression in her perimenopausal status, I recommend adjuvant endocrine therapy with tamoxifen for 10 years. If she becomes postmenopausal in the next few years, I will probably switch her to aromatase inhibitor for additional 5 years. The potential side effects were discussed in great detail and agreed to proceed.  -We also discussed the option of switching her to aromatase inhibitor in a few years when she became truly postmenopausal. -We also reviewed the breast cancer surveillance, including annual mammogram, and physical exam.  -I encouraged her to stay active while taking Tamoxifen.  -I encouraged her to have healthy diet and exercise regularly.  --She started Tamoxifen 10/13/17. She has been tolerating well so far and will conitnues for the next 10 years.  -She is clinically doing well. Lab reviewed, her CBC and CMP are within normal limits. There is no clinical concern for recurrence. -I recommend her going to our survivorship clinic, she declined at this time.  -F/u in 4 months for 1 year then every  6 months to monitor her closely.    2. Chronic dysuria and left frank pain  -She has trouble completely emptying her bladder, which has been chronic for many years -I strongly encourage her to have a PCP to help monitor her health and I offer to refer her if she would like. She declines for now.   3. Smoking cessation  -We previously discussed health complications with smoking  -I strongly suggest her to stop and she has tried to stop smoking in the past  PLAN: -Continue Tamoxifen  -Lab and f/u in 4 months    No orders of the defined types were placed in this encounter.   All questions were answered. The patient knows to call the clinic with any problems, questions or concerns. I spent 15 minutes counseling the patient face to face. The total time spent in the appointment was 20 minutes and more than 50% was on counseling.  This document serves as a record of services personally performed by [name], MD. It was created on her behalf by Joslyn Devon, a trained medical scribe. The creation of this record is based on the scribe's personal observations and the provider's statements to them.    I have reviewed the above documentation for accuracy and completeness, and I agree with the above.     [name], MD 11/09/2017

## 2018-02-04 ENCOUNTER — Other Ambulatory Visit: Payer: Self-pay | Admitting: Hematology

## 2018-02-04 DIAGNOSIS — C50212 Malignant neoplasm of upper-inner quadrant of left female breast: Secondary | ICD-10-CM

## 2018-02-04 DIAGNOSIS — Z170 Estrogen receptor positive status [ER+]: Principal | ICD-10-CM

## 2018-02-04 DIAGNOSIS — C50312 Malignant neoplasm of lower-inner quadrant of left female breast: Secondary | ICD-10-CM

## 2018-03-08 ENCOUNTER — Other Ambulatory Visit: Payer: Self-pay

## 2018-03-08 DIAGNOSIS — Z170 Estrogen receptor positive status [ER+]: Principal | ICD-10-CM

## 2018-03-08 DIAGNOSIS — C50212 Malignant neoplasm of upper-inner quadrant of left female breast: Secondary | ICD-10-CM

## 2018-03-09 NOTE — Progress Notes (Signed)
[place]  [phone] [phone]  Clinic Follow Up Note   Patient Care Team: Patient, No Pcp Per as PCP - General ([place]) [name], MD as Consulting Physician (Hematology) Jovita Kussmaul, MD as Consulting Physician (General Surgery) Eppie Gibson, MD as Attending Physician (Radiation Oncology)   Date of Service:  03/11/2018  CHIEF COMPLAINTS:  Follow up left breast cancer, estrogen receptor positive  Oncology History   Cancer Staging Malignant neoplasm of upper-inner quadrant of left breast in female, estrogen receptor positive ([place]) Staging form: Breast, AJCC 8th Edition - Clinical stage from 04/13/2017: Stage IIA (cT2, cN0, cM0, G2, ER: Positive, PR: Negative, HER2: Negative) - Signed by [name], MD on 04/25/2017 - Pathologic stage from 06/13/2017: Stage IIB (pT2, pN1a, cM0, G1, ER: Positive, PR: Negative, HER2: Negative) - Signed by [name], MD on 09/04/2017       Malignant neoplasm of upper-inner quadrant of left breast in female, estrogen receptor positive ([place])   04/10/2017 Mammogram    Left breast mammo and US showed an irregular hypoechoic shadowing mass at 9 o'clock 1 cm from the nipple measuring 2.3 x 1 x 2.4 cm. This corresponds well with the mass seen at mammography. No lymphadenopathy seen in the left axilla.      04/18/2017 Initial Diagnosis    Malignant neoplasm of upper-inner quadrant of left breast in female, estrogen receptor positive ([place])      04/22/2017 Receptors her2    Estrogen Receptor: 95%, POSITIVE, STRONG STAINING INTENSITY Progesterone Receptor: 0%, NEGATIVE Proliferation Marker Ki67: 10% HER2 -      04/22/2017 Initial Biopsy    Breast, left, needle core biopsy, 9:30 o'clock - INVASIVE DUCTAL CARCINOMA, G2 - DUCTAL CARCINOMA IN SITU.      06/13/2017 Surgery    Left breast mastectomy with sentinel lymph node biopsy performed by Dr Marlou Starks.       06/13/2017 Pathology Results    Breast, simple  mastectomy, Left - INVASIVE DUCTAL CARCINOMA, GRADE I/III, SPANNING 2.5 CM. - DUCTAL CARCINOMA IN SITU, INTERMEDIATE GRADE. - LOBULAR NEOPLASIA (ATYPICAL LOBULAR HYPERPLASIA). - THE SURGICAL RESECTION MARGINS ARE NEGATIVE FOR CARCINOMA.  Lymph node, sentinel, biopsy, Left axillary #1 - METASTATIC CARCINOMA IN 1 OF 3 LYMPH NODE (1/3), WITH EXTRACAPSULAR EXTENSION.      06/13/2017 Pathology Results    Mammaprint with low risk disease.       07/31/2017 - 09/10/2017 Radiation Therapy    The patient began a course of radiotherapy over the left chest wall and axillary area, supervised by Dr Eppie Gibson, MD.    Radiation treatment dates:   07/31/2017 - 09/10/2017  Site/dose:    1) Left Chest Wall / 50 Gy in 25 fractions 2) Left Supraclavicular / 45 Gy in 25 fractions 3) Left Chest Wall Scar Boost / 10 Gy in 5 fractions  Beams/energy:    1) Opposed tangents 3D/ 10 and 6 MV photons 2) Additional fields 3D / 10 MV and 6 MV photons 3) En face electrons / 6 MeV electrons  Narrative: The patient tolerated radiation treatment relatively well. She experienced mild fatigue. She reported pain, burning, and itching to the left breast and swelling under the left arm. On physical exam, she had dry peeling in the left UIQ of the breast and left axilla, as well as diffuse hyperpigmentation and erythema of the left chest wall. She is using the radiaplex cream to the treatment area and neosporin to the peeling areas.  10/13/2017 -  Anti-estrogen oral therapy    Tamoxifen 54m daily        HISTORY OF PRESENTING ILLNESS: 04/25/17 Kristina YGeorgena Spurling52y.o. female presents to the clinic today with her husband. Due to recent diagnosis of Malignant neoplasm of upper-inner quadrant of left breast in female, estrogen receptor positive. She [place] that showed evidence of a mass in the left breast. Following was a biopsy that showed DCIS in the left breast.   In  the past She reports to having right thigh nodule for over 15 years and nothing has changed. When she was seen by her gynecologist she was using vaginal cream to help with intercourse. She is no longer using cream. She reports to holding her urine as a child through the day had to be catheterized to help her urinate. This was her second mammogram, the first in 2016 showed an anomaly but she did not have a follow up screening. 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 Her son is 356and she had him when she was 89  Today she reports to feeling the lump herself for more than 30 years. Every time her period would start her breast would feel tight and after her cycle the lump would feel softer. She has not felt any growth of the lump. Her husband reports it showing through the skin. She denies any pain, nipple discharge. For over a year she has lower back pain and it worsens when she is sitting low for a while. [place] was related to her kidney. Her husband reports her having trouble urinating on and off for a year. However she has no PCP currently. Husband thinks she wants to get cancer completley removed with mastectomy and get breast reconstruction.   GYN HISTORY  Menarchal: 13 LMP: May 2017 Contraceptive:  HRT: no  G2P1, she [place] one son who [place]  CURRENT THERAPY: adjuvant Tamoxifen 267mdaily starting 10/13/17   INTERVAL HISTORY:  Kristina YiRiyanna Crutchleyeturns today for follow up. She presents to the clinic today accompanied by her husband. She notes she is doing well overall except for some pain in her left axillary region with movement. She states the pain is a 3-4/10. She notes the pain is constant and describes it as a tightness. She also reports dry skin on her face. She is compliant with Tamoxifen and reports manageable hot  flashes.She reports her last period was prior to her breast surgery.   On review of systems, pt denies or any other complaints at this time. Pertinent positives are listed and detailed within the above HPI.   MEDICAL HISTORY:  Past Medical History:  Diagnosis Date  . Breast mass    lt breast mass ? doesnt know how long  . Headache   . History of radiation therapy 07/31/17- 09/10/17   LEft chest wass 50 gy in 25 fractions, Left Supraclavicular 45 Gy in 25 fractions, Left Chest wall scar boost 10 Gy in 5 fractions.   . Malignant neoplasm of upper-inner quadrant of left breast in [place], [place]  . UTI (urinary tract infection)     SURGICAL HISTORY: Past Surgical History:  Procedure Laterality Date  . APPENDECTOMY    .  COLONOSCOPY    . GANGLION CYST EXCISION Right 09/24/2017   Procedure: EXCISION OF RIGHT DORSAL WRIST GANGLION;  Surgeon: Milly Jakob, MD;  Location: [facility];  Service: Orthopedics;  Laterality: Right;  . LIPOMA EXCISION Left 06/13/2017   Procedure: EXCISION 3CM LIPOMA ON LEFT ARM;  Surgeon: Jovita Kussmaul, MD;  Location: [facility];  Service: General;  Laterality: Left;  Marland Kitchen MASTECTOMY W/ SENTINEL NODE BIOPSY Left 06/13/2017   Procedure: MASTECTOMY WITH SENTINEL LYMPH NODE BIOPSY;  Surgeon: Jovita Kussmaul, MD;  Location: [facility];  Service: General;  Laterality: Left;    SOCIAL HISTORY: Social History   Socioeconomic History  . Marital status: Married    Spouse name: Not on file  . Number of children: Not on file  . Years of education: Not on file  . Highest education level: Not on file  Occupational History  . Not on file  Social Needs  . Financial resource strain: Not on file  . Food insecurity:    Worry: Not on file    Inability: Not on file  . Transportation needs:    Medical: Not on file    Non-medical: Not on file  Tobacco Use  . Smoking status: Former Smoker    Packs/day: 0.25    Years: 32.00    Pack  years: 8.00    Types: Cigarettes    Last attempt to quit: 05/24/2017    Years since quitting: 0.7  . Smokeless tobacco: Never Used  Substance and Sexual Activity  . Alcohol use: No    Frequency: Never  . Drug use: No  . Sexual activity: Not on file  Lifestyle  . Physical activity:    Days per week: Not on file    Minutes per session: Not on file  . Stress: Not on file  Relationships  . Social connections:    Talks on phone: Not on file    Gets together: Not on file    Attends religious service: Not on file    Active member of club or organization: Not on file    Attends meetings of clubs or organizations: Not on file    Relationship status: Not on file  . Intimate partner violence:    Fear of current or ex partner: Not on file    Emotionally abused: Not on file    Physically abused: Not on file    Forced sexual activity: Not on file  Other Topics Concern  . Not on file  Social History Narrative  . Not on file    FAMILY HISTORY: Family History  Problem Relation Age of Onset  . Colon cancer Brother     ALLERGIES:  has No Known Allergies.  MEDICATIONS:  Current Outpatient Medications  Medication Sig Dispense Refill  . tamoxifen (NOLVADEX) 20 MG tablet   3  . tamoxifen (NOLVADEX) 20 MG tablet TAKE 1 TABLET(20 MG) BY MOUTH DAILY 30 tablet 3  . acetaminophen (TYLENOL) 325 MG tablet Take 2 tablets (650 mg total) every 6 (six) hours by mouth. (Patient not taking: Reported on 03/11/2018)    . benzocaine-Menthol (DERMOPLAST) 20-0.5 % AERO Apply 1 application topically daily as needed for irritation (itching).    . meloxicam (MOBIC) 15 MG tablet Take 15 mg daily by mouth.    . oxyCODONE (ROXICODONE) 5 MG immediate release tablet Take 1 tablet (5 mg total) every 6 (six) hours as needed by mouth for severe pain. (Patient not taking: Reported on 03/11/2018) 28 tablet 0  No current facility-administered medications for this visit.    REVIEW OF SYSTEMS:   Constitutional: Denies  fevers, chills or abnormal night sweats (+) hot flashes Eyes: Denies blurriness of vision, double vision or watery eyes Ears, nose, mouth, throat, and face: Denies mucositis or sore throat Respiratory: Denies cough, dyspnea or wheezes Cardiovascular: Denies palpitation, chest discomfort or lower extremity swelling Gastrointestinal:  Denies nausea, heartburn or change in bowel habits  Skin: Denies abnormal skin rashes (+) dry skin on her face  Lymphatics: Denies new lymphadenopathy or easy bruising Neurological:Denies numbness, tingling or new weaknesses Behavioral/Psych: Mood is stable, no new changes  Breast: (+) tightness in axillary region  All other systems were reviewed with the patient and are negative.  PHYSICAL EXAMINATION: ECOG PERFORMANCE STATUS: 0 - Asymptomatic  Vitals:   03/11/18 0950  BP: 129/65  Pulse: (!) 57  Resp: 19  Temp: 97.6 F (36.4 C)  SpO2: 100%   Filed Weights   03/11/18 0950  Weight: 130 lb 4.8 oz (59.1 kg)    GENERAL:alert, no distress and comfortable SKIN: skin color, texture, turgor are normal, no rashes or significant lesions (+)  EYES: normal, conjunctiva are pink and non-injected, sclera clear OROPHARYNX:no exudate, no erythema and lips, buccal mucosa, and tongue normal  NECK: supple, thyroid normal size, non-tender, without nodularity LYMPH:  no palpable lymphadenopathy in the cervical, axillary or inguinal LUNGS: clear to auscultation and percussion with normal breathing effort HEART: regular rate & rhythm and no murmurs and no lower extremity edema ABDOMEN:abdomen soft, non-tender and normal bowel sounds  Extremity: (+) subcutaneous nodule right front thigh Musculoskeletal:no cyanosis of digits and no clubbing  PSYCH: alert & oriented x 3 with fluent speech NEURO: no focal motor/sensory deficits Breasts: She is s/p left breast mastectomy. Mild skin hyperpigmentation over the left chest wall. Her surgical scars are well healed. No palpable  mass or adenopathy    LABORATORY DATA:  I have reviewed the data as listed CBC Latest Ref Rng & Units 03/11/2018 11/09/2017 06/11/2017  WBC 3.9 - 10.3 K/uL 3.9 3.9 5.5  Hemoglobin 11.6 - 15.9 g/dL 12.3 12.6 12.6  Hematocrit 34.8 - 46.6 % 37.9 38.0 37.5  Platelets 145 - 400 K/uL 207 203 262    CMP Latest Ref Rng & Units 03/11/2018 11/09/2017 06/11/2017  Glucose 70 - 140 mg/dL 103 98 95  BUN 7 - 26 mg/dL 12 11.9 13  Creatinine 0.60 - 1.10 mg/dL 0.72 0.8 0.79  Sodium 136 - 145 mmol/L 141 141 141  Potassium 3.5 - 5.1 mmol/L 3.8 4.5 4.1  Chloride 98 - 109 mmol/L 108 - 107  CO2 22 - 29 mmol/L _0 Calcium 8.4 - 10.4 mg/dL 9.4 9.1 9.3  Total Protein 6.4 - 8.3 g/dL 6.9 6.8 -  Total Bilirubin 0.2 - 1.2 mg/dL 0.3 0.64 -  Alkaline Phos 40 - 150 U/L 52 57 -  AST 5 - 34 U/L 12 14 -  ALT 0 - 55 U/L 10 10 -   PATHOLOGY  Diagnosis 06/13/17 1. Fatty tissue, Left Upper Arm - MATURE ADIPOSE TISSUE, CONSISTENT WITH LIPOMA. 2. Breast, simple mastectomy, Left - INVASIVE DUCTAL CARCINOMA, GRADE I/III, SPANNING 2.5 CM. - DUCTAL CARCINOMA IN SITU, INTERMEDIATE GRADE. - LOBULAR NEOPLASIA (ATYPICAL LOBULAR HYPERPLASIA). - THE SURGICAL RESECTION MARGINS ARE NEGATIVE FOR CARCINOMA. - SEE ONCOLOGY TABLE BELOW. 3. Lymph node, sentinel, biopsy, Left axillary #1 - METASTATIC CARCINOMA IN 1 OF 3 LYMPH NODE (1/3), WITH EXTRACAPSULAR EXTENSION. 4. Lymph node, sentinel, biopsy,  Left axillary #2 - THERE IS NO EVIDENCE OF CARCINOMA IN 1 OF 1 LYMPH NODE (0/1). 5. Breast, excision, Inferior and medial margins, left - BENIGN BREAST PARENCHYMA. - THERE IS NO EVIDENCE OF MALIGNANCY. Microscopic Comment 2. BREAST, INVASIVE TUMOR Procedure: Simple mastectomy and axillary lymph node resection Laterality: Left Tumor Size: 2.5 cm (gross measurement) Histologic Type: Ductal Grade: I Tubular Differentiation: 1 Nuclear Pleomorphism: 2 Mitotic Count: 1 Ductal Carcinoma in Situ (DCIS): Present, intermediate  grade Extent of Tumor: Confined to breast parenchyma. Margins: Greater than 0.2 cm to all margins Regional Lymph Nodes: Number of Lymph Nodes Examined: 4 1 of 3 FINAL for Kristina Nicholson, Kristina Nicholson ([id number]) Microscopic Comment(continued) Number of Sentinel Lymph Nodes Examined: 4 Lymph Nodes with Macrometastases: 1 Lymph Nodes with Micrometastases: 0 Lymph Nodes with Isolated Tumor Cells: 0 Breast Prognostic Profile: Case [id number] Estrogen Receptor: 95%, strong Progesterone Receptor: 0% Her2: No amplification was detected. The ratio was 1.29 Ki-67: 10% Best tumor block for sendout testing: 2C Pathologic Stage Classification (pTNM, AJCC 8th Edition): Primary Tumor (pT): pT2 Regional Lymph Nodes (pN): pN1a Distant Metastases (pM): pMX  Breast, left, needle core biopsy, 9:30 o'clock 04/22/17 - INVASIVE DUCTAL CARCINOMA, G2 - DUCTAL CARCINOMA IN SITU. Estrogen Receptor: 95%, POSITIVE, STRONG STAINING INTENSITY Progesterone Receptor: 0%, NEGATIVE Proliferation Marker Ki67: 10% HER2 -  RADIOGRAPHIC STUDIES: I have personally reviewed the radiological images as listed and agreed with the findings in the report. No results found.  ASSESSMENT & PLAN:  Kristina Nicholson is a 52 y.o. [place] perimenopausal female who has a history of an appendectomy.   1. Malignant neoplasm of upper-inner quadrant of left breast in female, [name], stage IIB, ER: Positive, PR: Negative, HER2: Negative, Grade 3 ---She underwent left breast mastectomy with SLN biopsy on 06/13/17. Surgical margins were clear, however, one lymph node of the left axillary region did show metastatic carcinoma with extracapsular extension. I discussed the results of this in great detail with her. -Her mammaprint showed low risk disease, adjuvant chemotherapy was not recommended. -given her low risk disease and stage II disease, staging scan is not needed  -She underwent adjuvant radiation with Dr. Isidore Moos on  07/31/17-09/10/17 -Giving the strong ER expression in her perimenopausal status, I recommend adjuvant endocrine therapy with tamoxifen for 10 years. If she becomes postmenopausal in the next few years, I will probably switch her to aromatase inhibitor for additional 5 years. The potential side effects were discussed in great detail and agreed to proceed.  -We also previously discussed the option of switching her to aromatase inhibitor in a few years when she became truly postmenopausal. -We previously reviewed the breast cancer surveillance, including annual mammogram, and physical exam.  -previously declined survivorship clinic  -I encouraged her to stay active while taking Tamoxifen.  -I encouraged her to have healthy diet and exercise regularly.  --She started Tamoxifen 10/13/17. She has been tolerating well so far and will conitnues for the next 10 years.  -She is clinically doing well. Lab reviewed, her CBC is within normal limits, CMP is pending.  Her physical exam was unremarkable. There is no clinical concern for recurrence. -Plan to check [facility] next year. I discussed that if she becomes post-menopausal I can switch her to an aromatase inhibitor.  -Mammogram due in May 2019. Ordered today -F/u in 4 months   2. Chronic dysuria and left frank pain  -She has trouble completely emptying her bladder, which has been chronic for many years -I strongly encourage her to have a  PCP to help monitor her health and I offer to refer her if she would like. She declines for now.   3. Smoking cessation  -We previously discussed health complications with smoking  -I strongly suggested her to stop and she has tried to stop smoking in the past  4. Left chest pain --She does have left chest wall pain near her surgical incision that is constant/mild and dull, likely secondary to surgery and radiation.  Exam was negative.  I recommend her to use Tylenol as needed and to be more active. She knows she can reach out  to he physical therapist if she wants to schedule more appointments.    PLAN: -Continue Tamoxifen  -[facility] next [facility] -Lab and f/u in 4 months, will check [facility] on next visit   Orders Placed This Encounter  Procedures  . MM DIAG BREAST TOMO UNI RIGHT    Standing Status:   Future    Standing Expiration Date:   03/12/2019    Order Specific Question:   Reason for Exam (SYMPTOM  OR DIAGNOSIS REQUIRED)    Answer:   screening    Order Specific Question:   Is the patient pregnant?    Answer:   No    Order Specific Question:   Preferred imaging location?    Answer:   [facility]   All questions were answered. The patient knows to call the clinic with any problems, questions or concerns. I spent 20 minutes counseling the patient face to face. The total time spent in the appointment was 25 minutes and more than 50% was on counseling.  This document serves as a record of services personally performed by [name], MD. It was created on her behalf by Theresia Bough, a trained medical scribe. The creation of this record is based on the scribe's personal observations and the provider's statements to them.   I have reviewed the above documentation for accuracy and completeness, and I agree with the above.    [name], MD 03/11/2018

## 2018-03-11 ENCOUNTER — Encounter: Payer: Self-pay | Admitting: Hematology

## 2018-03-11 ENCOUNTER — Inpatient Hospital Stay: Payer: BLUE CROSS/BLUE SHIELD

## 2018-03-11 ENCOUNTER — Telehealth: Payer: Self-pay | Admitting: Hematology

## 2018-03-11 ENCOUNTER — Inpatient Hospital Stay: Payer: BLUE CROSS/BLUE SHIELD | Attending: Hematology | Admitting: Hematology

## 2018-03-11 VITALS — BP 129/65 | HR 57 | Temp 97.6°F | Resp 19 | Ht 63.0 in | Wt 130.3 lb

## 2018-03-11 DIAGNOSIS — Z9012 Acquired absence of left breast and nipple: Secondary | ICD-10-CM | POA: Insufficient documentation

## 2018-03-11 DIAGNOSIS — R300 Dysuria: Secondary | ICD-10-CM | POA: Diagnosis not present

## 2018-03-11 DIAGNOSIS — Z170 Estrogen receptor positive status [ER+]: Secondary | ICD-10-CM | POA: Insufficient documentation

## 2018-03-11 DIAGNOSIS — Z87891 Personal history of nicotine dependence: Secondary | ICD-10-CM | POA: Diagnosis not present

## 2018-03-11 DIAGNOSIS — C779 Secondary and unspecified malignant neoplasm of lymph node, unspecified: Secondary | ICD-10-CM | POA: Diagnosis not present

## 2018-03-11 DIAGNOSIS — C50212 Malignant neoplasm of upper-inner quadrant of left female breast: Secondary | ICD-10-CM | POA: Insufficient documentation

## 2018-03-11 DIAGNOSIS — Z79899 Other long term (current) drug therapy: Secondary | ICD-10-CM

## 2018-03-11 DIAGNOSIS — C50312 Malignant neoplasm of lower-inner quadrant of left female breast: Secondary | ICD-10-CM

## 2018-03-11 DIAGNOSIS — C799 Secondary malignant neoplasm of unspecified site: Secondary | ICD-10-CM | POA: Diagnosis not present

## 2018-03-11 DIAGNOSIS — R0789 Other chest pain: Secondary | ICD-10-CM | POA: Diagnosis not present

## 2018-03-11 LAB — CMP (CANCER CENTER ONLY)
ALBUMIN: 3.8 g/dL (ref 3.5–5.0)
ALK PHOS: 52 U/L (ref 40–150)
ALT: 10 U/L (ref 0–55)
ANION GAP: 8 (ref 3–11)
AST: 12 U/L (ref 5–34)
BILIRUBIN TOTAL: 0.3 mg/dL (ref 0.2–1.2)
BUN: 12 mg/dL (ref 7–26)
CALCIUM: 9.4 mg/dL (ref 8.4–10.4)
CO2: 25 mmol/L (ref 22–29)
Chloride: 108 mmol/L (ref 98–109)
Creatinine: 0.72 mg/dL (ref 0.60–1.10)
GFR, Estimated: >60 mL/min (ref >60)
Glucose, Bld: 103 mg/dL (ref 70–140)
Potassium: 3.8 mmol/L (ref 3.5–5.1)
SODIUM: 141 mmol/L (ref 136–145)
TOTAL PROTEIN: 6.9 g/dL (ref 6.4–8.3)

## 2018-03-11 LAB — CBC WITH DIFFERENTIAL (CANCER CENTER ONLY)
BASOS PCT: 0 %
Basophils Absolute: 0 10*3/uL (ref 0.0–0.1)
Eosinophils Absolute: 0.1 10*3/uL (ref 0.0–0.5)
Eosinophils Relative: 3 %
HEMATOCRIT: 37.9 % (ref 34.8–46.6)
HEMOGLOBIN: 12.3 g/dL (ref 11.6–15.9)
LYMPHS PCT: 33 %
Lymphs Abs: 1.3 10*3/uL (ref 0.9–3.3)
MCH: 30.5 pg (ref 25.1–34.0)
MCHC: 32.5 g/dL (ref 31.5–36.0)
MCV: 94 fL (ref 79.5–101.0)
Monocytes Absolute: 0.4 10*3/uL (ref 0.1–0.9)
Monocytes Relative: 9 %
NEUTROS ABS: 2.2 10*3/uL (ref 1.5–6.5)
NEUTROS PCT: 55 %
Platelet Count: 207 10*3/uL (ref 145–400)
RBC: 4.03 MIL/uL (ref 3.70–5.45)
RDW: 13.3 % (ref 11.2–14.5)
WBC Count: 3.9 10*3/uL (ref 3.9–10.3)

## 2018-03-11 NOTE — Telephone Encounter (Signed)
Appointments scheduled Letter/Calendar mailed to patient per 4/29 los

## 2018-04-11 ENCOUNTER — Other Ambulatory Visit: Payer: Self-pay | Admitting: Hematology

## 2018-04-11 ENCOUNTER — Ambulatory Visit
Admission: RE | Admit: 2018-04-11 | Discharge: 2018-04-11 | Disposition: A | Discharge status: Home / Self Care | Payer: BLUE CROSS/BLUE SHIELD | Source: Ambulatory Visit | Attending: Hematology | Admitting: Hematology

## 2018-04-11 DIAGNOSIS — Z1231 Encounter for screening mammogram for malignant neoplasm of breast: Secondary | ICD-10-CM | POA: Diagnosis not present

## 2018-04-11 DIAGNOSIS — Z170 Estrogen receptor positive status [ER+]: Principal | ICD-10-CM

## 2018-04-11 DIAGNOSIS — C50312 Malignant neoplasm of lower-inner quadrant of left female breast: Secondary | ICD-10-CM

## 2018-04-11 DIAGNOSIS — C50212 Malignant neoplasm of upper-inner quadrant of left female breast: Secondary | ICD-10-CM

## 2018-04-11 HISTORY — DX: Personal history of irradiation: Z92.3

## 2018-05-30 ENCOUNTER — Other Ambulatory Visit: Payer: Self-pay | Admitting: Hematology

## 2018-05-30 DIAGNOSIS — C50212 Malignant neoplasm of upper-inner quadrant of left female breast: Secondary | ICD-10-CM

## 2018-05-30 DIAGNOSIS — Z170 Estrogen receptor positive status [ER+]: Principal | ICD-10-CM

## 2018-05-30 DIAGNOSIS — C50312 Malignant neoplasm of lower-inner quadrant of left female breast: Secondary | ICD-10-CM

## 2018-06-27 ENCOUNTER — Other Ambulatory Visit: Payer: Self-pay | Admitting: Hematology

## 2018-06-27 DIAGNOSIS — C50312 Malignant neoplasm of lower-inner quadrant of left female breast: Secondary | ICD-10-CM

## 2018-06-27 DIAGNOSIS — Z170 Estrogen receptor positive status [ER+]: Principal | ICD-10-CM

## 2018-06-27 DIAGNOSIS — C50212 Malignant neoplasm of upper-inner quadrant of left female breast: Secondary | ICD-10-CM

## 2018-07-10 ENCOUNTER — Telehealth: Payer: Self-pay | Admitting: Hematology

## 2018-07-10 NOTE — Telephone Encounter (Signed)
Scheduled appt per 8/28 sch message - pt is aware of appt date and time

## 2018-07-11 ENCOUNTER — Ambulatory Visit: Payer: BLUE CROSS/BLUE SHIELD | Admitting: Hematology

## 2018-07-11 ENCOUNTER — Other Ambulatory Visit: Payer: BLUE CROSS/BLUE SHIELD

## 2018-07-22 NOTE — Progress Notes (Signed)
[place]  [phone] [phone]  Clinic Follow Up Note   Patient Care Team: Patient, No Pcp Per as PCP - General ([place]) [name], MD as Consulting Physician (Hematology) Jovita Kussmaul, MD as Consulting Physician (General Surgery) Eppie Gibson, MD as Attending Physician (Radiation Oncology)   Date of Service:  07/23/2018  CHIEF COMPLAINTS:  Follow up left breast cancer, estrogen receptor positive  Oncology History   Cancer Staging Malignant neoplasm of upper-inner quadrant of left breast in female, estrogen receptor positive ([place]) Staging form: Breast, AJCC 8th Edition - Clinical stage from 04/13/2017: Stage IIA (cT2, cN0, cM0, G2, ER: Positive, PR: Negative, HER2: Negative) - Signed by [name], MD on 04/25/2017 - Pathologic stage from 06/13/2017: Stage IIB (pT2, pN1a, cM0, G1, ER: Positive, PR: Negative, HER2: Negative) - Signed by [name], MD on 09/04/2017       Malignant neoplasm of upper-inner quadrant of left breast in female, estrogen receptor positive ([place])   04/10/2017 Mammogram    Left breast mammo and US showed an irregular hypoechoic shadowing mass at 9 o'clock 1 cm from the nipple measuring 2.3 x 1 x 2.4 cm. This corresponds well with the mass seen at mammography. No lymphadenopathy seen in the left axilla.    04/18/2017 Initial Diagnosis    Malignant neoplasm of upper-inner quadrant of left breast in female, estrogen receptor positive ([place])    04/22/2017 Receptors her2    Estrogen Receptor: 95%, POSITIVE, STRONG STAINING INTENSITY Progesterone Receptor: 0%, NEGATIVE Proliferation Marker Ki67: 10% HER2 -    04/22/2017 Initial Biopsy    Breast, left, needle core biopsy, 9:30 o'clock - INVASIVE DUCTAL CARCINOMA, G2 - DUCTAL CARCINOMA IN SITU.    06/13/2017 Surgery    Left breast mastectomy with sentinel lymph node biopsy performed by Dr Marlou Starks.     06/13/2017 Pathology Results    Breast, simple mastectomy, Left -  INVASIVE DUCTAL CARCINOMA, GRADE I/III, SPANNING 2.5 CM. - DUCTAL CARCINOMA IN SITU, INTERMEDIATE GRADE. - LOBULAR NEOPLASIA (ATYPICAL LOBULAR HYPERPLASIA). - THE SURGICAL RESECTION MARGINS ARE NEGATIVE FOR CARCINOMA.  Lymph node, sentinel, biopsy, Left axillary #1 - METASTATIC CARCINOMA IN 1 OF 3 LYMPH NODE (1/3), WITH EXTRACAPSULAR EXTENSION.    06/13/2017 Pathology Results    Mammaprint with low risk disease.     07/31/2017 - 09/10/2017 Radiation Therapy    The patient began a course of radiotherapy over the left chest wall and axillary area, supervised by Dr Eppie Gibson, MD.    Radiation treatment dates:   07/31/2017 - 09/10/2017  Site/dose:    1) Left Chest Wall / 50 Gy in 25 fractions 2) Left Supraclavicular / 45 Gy in 25 fractions 3) Left Chest Wall Scar Boost / 10 Gy in 5 fractions  Beams/energy:    1) Opposed tangents 3D/ 10 and 6 MV photons 2) Additional fields 3D / 10 MV and 6 MV photons 3) En face electrons / 6 MeV electrons  Narrative: The patient tolerated radiation treatment relatively well. She experienced mild fatigue. She reported pain, burning, and itching to the left breast and swelling under the left arm. On physical exam, she had dry peeling in the left UIQ of the breast and left axilla, as well as diffuse hyperpigmentation and erythema of the left chest wall. She is using the radiaplex cream to the treatment area and neosporin to the peeling areas.    10/13/2017 -  Anti-estrogen oral therapy    Tamoxifen 35m daily    04/11/2018  Mammogram    04/11/2018 Mammogram IMPRESSION: No mammographic evidence of malignancy. A result letter of this screening mammogram will be mailed directly to the patient.    04/11/2018 Mammogram    Screening Unilateral Right: No evidence of mammographic malignancy.       HISTORY OF PRESENTING ILLNESS: 04/25/17 Kristina Nicholson 52 y.o. female presents to the clinic today with her husband. Due to recent diagnosis of Malignant  neoplasm of upper-inner quadrant of left breast in female, estrogen receptor positive. She had a diagnostic mammogram and [place] on 04/10/17 that showed evidence of a mass in the left breast. Following was a biopsy that showed DCIS in the left breast.   In the past She reports to having right thigh nodule for over 15 years and nothing has changed. When she was seen by her gynecologist she was using vaginal cream to help with intercourse. She is no longer using cream. She reports to holding her urine as a child through the day had to be catheterized to help her urinate. This was her second mammogram, the first in 2016 showed an anomaly but she did not have a follow up screening. 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 She will spend 3 month in [place] every 2-3 years. Her son is 53 and she had him when she was 47.  Today she reports to feeling the lump herself for more than 30 years. Every time her period would start her breast would feel tight and after her cycle the lump would feel softer. She has not felt any growth of the lump. Her husband reports it showing through the skin. She denies any pain, nipple discharge. For over a year she has lower back pain and it worsens when she is sitting low for a while. She saw a doctor when she was in [place] and she was told her pain was related to her kidney. Her husband reports her having trouble urinating on and off for a year. However she has no PCP currently. Husband thinks she wants to get cancer completley removed with mastectomy and get breast reconstruction.   GYN HISTORY  Menarchal: 13 LMP: May 2017 Contraceptive:  HRT: no  G2P1, she has one son who is in [place]   CURRENT THERAPY: adjuvant Tamoxifen 82m daily starting 10/13/17   INTERVAL HISTORY:  Kristina YKierstyn Baranowskireturns today for follow up. Pt presents to the office today accompanied by her husband. She reports that she is doing well overall. Her  LMP was last year, 2018. [place] and wanted to know if she would be able to have a Tamoxifen prescription for up to 3 months.   Of note since the patient last visit, she has had a screening unilateral right mammogram completed on 04/11/2018 with results revealing No mammographic evidence of malignancy.   On review of systems, she reports constant, 7-8/10, left hip pain, intermittent stiffness to bilateral fingers, right lateral chest wall pain, and intermittent lower back pain. With her left hip pain, she had this symptoms before she started on anastrozole. She doesn't have a PCP at this time. she denies any other symptoms. Pertinent positives are listed and detailed within the above HPI.   MEDICAL HISTORY:  Past Medical History:  Diagnosis Date  . Breast mass    lt breast mass ? doesnt know how long  . Headache   . History  of radiation therapy 07/31/17- 09/10/17   LEft chest wass 50 gy in 25 fractions, Left Supraclavicular 45 Gy in 25 fractions, Left Chest wall scar boost 10 Gy in 5 fractions.   . Malignant neoplasm of upper-inner quadrant of left breast in female, estrogen receptor positive ([place]) 04/18/2017  . Personal history of radiation therapy   . UTI (urinary tract infection)     SURGICAL HISTORY: Past Surgical History:  Procedure Laterality Date  . APPENDECTOMY    . COLONOSCOPY    . GANGLION CYST EXCISION Right 09/24/2017   Procedure: EXCISION OF RIGHT DORSAL WRIST GANGLION;  Surgeon: Milly Jakob, MD;  Location: [facility];  Service: Orthopedics;  Laterality: Right;  . LIPOMA EXCISION Left 06/13/2017   Procedure: EXCISION 3CM LIPOMA ON LEFT ARM;  Surgeon: Jovita Kussmaul, MD;  Location: [facility];  Service: General;  Laterality: Left;  Marland Kitchen MASTECTOMY Left 2018  . MASTECTOMY W/ SENTINEL NODE BIOPSY Left 06/13/2017   Procedure: MASTECTOMY WITH SENTINEL LYMPH NODE BIOPSY;  Surgeon: Jovita Kussmaul, MD;  Location: [facility];  Service:  General;  Laterality: Left;    SOCIAL HISTORY: Social History   Socioeconomic History  . Marital status: Married    Spouse name: Not on file  . Number of children: Not on file  . Years of education: Not on file  . Highest education level: Not on file  Occupational History  . Not on file  Social Needs  . Financial resource strain: Not on file  . Food insecurity:    Worry: Not on file    Inability: Not on file  . Transportation needs:    Medical: Not on file    Non-medical: Not on file  Tobacco Use  . Smoking status: Former Smoker    Packs/day: 0.25    Years: 32.00    Pack years: 8.00    Types: Cigarettes    Last attempt to quit: 05/24/2017    Years since quitting: 1.1  . Smokeless tobacco: Never Used  Substance and Sexual Activity  . Alcohol use: No    Frequency: Never  . Drug use: No  . Sexual activity: Not on file  Lifestyle  . Physical activity:    Days per week: Not on file    Minutes per session: Not on file  . Stress: Not on file  Relationships  . Social connections:    Talks on phone: Not on file    Gets together: Not on file    Attends religious service: Not on file    Active member of club or organization: Not on file    Attends meetings of clubs or organizations: Not on file    Relationship status: Not on file  . Intimate partner violence:    Fear of current or ex partner: Not on file    Emotionally abused: Not on file    Physically abused: Not on file    Forced sexual activity: Not on file  Other Topics Concern  . Not on file  Social History Narrative  . Not on file    FAMILY HISTORY: Family History  Problem Relation Age of Onset  . Colon cancer Brother     ALLERGIES:  has No Known Allergies.  MEDICATIONS:  Current Outpatient Medications  Medication Sig Dispense Refill  . acetaminophen (TYLENOL) 325 MG tablet Take 2 tablets (650 mg total) every 6 (six) hours by mouth.    . benzocaine-Menthol (DERMOPLAST) 20-0.5 % AERO Apply 1  application topically daily as  needed for irritation (itching).    . meloxicam (MOBIC) 15 MG tablet Take 15 mg daily by mouth.    . oxyCODONE (ROXICODONE) 5 MG immediate release tablet Take 1 tablet (5 mg total) every 6 (six) hours as needed by mouth for severe pain. 28 tablet 0  . tamoxifen (NOLVADEX) 20 MG tablet TAKE 1 TABLET(20 MG) BY MOUTH DAILY 90 tablet 1   No current facility-administered medications for this visit.    REVIEW OF SYSTEMS:   Constitutional: Denies fevers, chills or abnormal night sweats (+) hot flashes Eyes: Denies blurriness of vision, double vision or watery eyes Ears, nose, mouth, throat, and face: Denies mucositis or sore throat Respiratory: Denies cough, dyspnea or wheezes Cardiovascular: Denies palpitation, chest discomfort or lower extremity swelling Gastrointestinal:  Denies nausea, heartburn or change in bowel habits  Skin: Denies abnormal skin rashes (+) dry skin on her face  MSK: (+) left hip pain (+) right lower back pain Lymphatics: Denies new lymphadenopathy or easy bruising Neurological:Denies numbness, tingling or new weaknesses Behavioral/Psych: Mood is stable, no new changes  Breast: (+) tightness in axillary region  All other systems were reviewed with the patient and are negative.  PHYSICAL EXAMINATION: ECOG PERFORMANCE STATUS: 1  Vitals:   07/23/18 0932  BP: 121/68  Pulse: 61  Resp: 18  Temp: 98.1 F (36.7 C)  SpO2: 100%   Filed Weights   07/23/18 0932  Weight: 126 lb 3.2 oz (57.2 kg)    GENERAL:alert, no distress and comfortable SKIN: skin color, texture, turgor are normal, no rashes or significant lesions EYES: normal, conjunctiva are pink and non-injected, sclera clear OROPHARYNX:no exudate, no erythema and lips, buccal mucosa, and tongue normal  NECK: supple, thyroid normal size, non-tender, without nodularity LYMPH:  no palpable lymphadenopathy in the cervical, axillary or inguinal LUNGS: clear to auscultation and  percussion with normal breathing effort HEART: regular rate & rhythm and no murmurs and no lower extremity edema ABDOMEN:abdomen soft, non-tender and normal bowel sounds  Musculoskeletal:no cyanosis of digits and no clubbing  PSYCH: alert & oriented x 3 with fluent speech NEURO: no focal motor/sensory deficits Breasts: She is s/p left breast mastectomy. Her surgical scars are well healed. No palpable mass or adenopathy. Right breast is normal.    LABORATORY DATA:  I have reviewed the data as listed CBC Latest Ref Rng & Units 07/23/2018 03/11/2018 11/09/2017  WBC 3.9 - 10.3 K/uL 3.1(L) 3.9 3.9  Hemoglobin 11.6 - 15.9 g/dL 12.2 12.3 12.6  Hematocrit 34.8 - 46.6 % 36.7 37.9 38.0  Platelets 145 - 400 K/uL 210 207 203    CMP Latest Ref Rng & Units 07/23/2018 03/11/2018 11/09/2017  Glucose 70 - 99 mg/dL 123(H) 103 98  BUN 6 - 20 mg/dL 10 12 11.9  Creatinine 0.44 - 1.00 mg/dL 0.72 0.72 0.8  Sodium 135 - 145 mmol/L 143 141 141  Potassium 3.5 - 5.1 mmol/L 4.0 3.8 4.5  Chloride 98 - 111 mmol/L 109 108 -  CO2 22 - 32 mmol/L _0 Calcium 8.9 - 10.3 mg/dL 9.1 9.4 9.1  Total Protein 6.5 - 8.1 g/dL 6.7 6.9 6.8  Total Bilirubin 0.3 - 1.2 mg/dL 0.5 0.3 0.64  Alkaline Phos 38 - 126 U/L 41 52 57  AST 15 - 41 U/L 12(L) 12 14  ALT 0 - 44 U/L _1 PATHOLOGY  Diagnosis 06/13/17 1. Fatty tissue, Left Upper Arm - MATURE ADIPOSE TISSUE, CONSISTENT WITH LIPOMA. 2. Breast, simple mastectomy, Left -  INVASIVE DUCTAL CARCINOMA, GRADE I/III, SPANNING 2.5 CM. - DUCTAL CARCINOMA IN SITU, INTERMEDIATE GRADE. - LOBULAR NEOPLASIA (ATYPICAL LOBULAR HYPERPLASIA). - THE SURGICAL RESECTION MARGINS ARE NEGATIVE FOR CARCINOMA. - SEE ONCOLOGY TABLE BELOW. 3. Lymph node, sentinel, biopsy, Left axillary #1 - METASTATIC CARCINOMA IN 1 OF 3 LYMPH NODE (1/3), WITH EXTRACAPSULAR EXTENSION. 4. Lymph node, sentinel, biopsy, Left axillary #2 - THERE IS NO EVIDENCE OF CARCINOMA IN 1 OF 1 LYMPH NODE (0/1). 5. Breast,  excision, Inferior and medial margins, left - BENIGN BREAST PARENCHYMA. - THERE IS NO EVIDENCE OF MALIGNANCY. Microscopic Comment 2. BREAST, INVASIVE TUMOR Procedure: Simple mastectomy and axillary lymph node resection Laterality: Left Tumor Size: 2.5 cm (gross measurement) Histologic Type: Ductal Grade: I Tubular Differentiation: 1 Nuclear Pleomorphism: 2 Mitotic Count: 1 Ductal Carcinoma in Situ (DCIS): Present, intermediate grade Extent of Tumor: Confined to breast parenchyma. Margins: Greater than 0.2 cm to all margins Regional Lymph Nodes: Number of Lymph Nodes Examined: 4 1 of 3 FINAL for Kristina Nicholson, Kristina Nicholson ([id number]) Microscopic Comment(continued) Number of Sentinel Lymph Nodes Examined: 4 Lymph Nodes with Macrometastases: 1 Lymph Nodes with Micrometastases: 0 Lymph Nodes with Isolated Tumor Cells: 0 Breast Prognostic Profile: Case [id number] Estrogen Receptor: 95%, strong Progesterone Receptor: 0% Her2: No amplification was detected. The ratio was 1.29 Ki-67: 10% Best tumor block for sendout testing: 2C Pathologic Stage Classification (pTNM, AJCC 8th Edition): Primary Tumor (pT): pT2 Regional Lymph Nodes (pN): pN1a Distant Metastases (pM): pMX  Breast, left, needle core biopsy, 9:30 o'clock 04/22/17 - INVASIVE DUCTAL CARCINOMA, G2 - DUCTAL CARCINOMA IN SITU. Estrogen Receptor: 95%, POSITIVE, STRONG STAINING INTENSITY Progesterone Receptor: 0%, NEGATIVE Proliferation Marker Ki67: 10% HER2 -  RADIOGRAPHIC STUDIES: I have personally reviewed the radiological images as listed and agreed with the findings in the report. No results found.  ASSESSMENT & PLAN:  Kristina Nicholson is a 52 y.o. [place] perimenopausal female who has a history of an appendectomy.   1. Malignant neoplasm of upper-inner quadrant of left breast in female, [name], stage IIB, ER: Positive, PR: Negative, HER2: Negative, Grade 3 ---She underwent left breast mastectomy with SLN  biopsy on 06/13/17. Surgical margins were clear, however, one lymph node of the left axillary region did show metastatic carcinoma with extracapsular extension. I discussed the results of this in great detail with her. -Her mammaprint showed low risk disease, adjuvant chemotherapy was not recommended. -given her low risk disease and stage II disease, staging scan is not needed  -She underwent adjuvant radiation with Dr. Isidore Moos on 07/31/17-09/10/17 -Giving the strong ER expression in her perimenopausal status, I recommend adjuvant endocrine therapy with tamoxifen for 10 years. If she becomes postmenopausal in the next few years, I will probably switch her to aromatase inhibitor for additional 5 years. The potential side effects were discussed in great detail and agreed to proceed.  -We also previously discussed the option of switching her to aromatase inhibitor in a few years when she became truly postmenopausal. -We previously reviewed the breast cancer surveillance, including annual mammogram, and physical exam.  -previously declined survivorship clinic  -I previously encouraged her to stay active while taking Tamoxifen.  -I previously encouraged her to have healthy diet and exercise regularly.  --She started Tamoxifen 10/13/17. She has been tolerating well so far and will continue.  I checked her [place] and estradiol level today, if she is truly postmenopausal, we may consider switching her tamoxifen to aromatase inhibitor on her next visit. -She is clinically doing well. Lab reviewed, her CBC  is within normal limits, CMP is pending. Her physical exam was unremarkable. There is no clinical concern for recurrence.  -Screening right unilateral mammogram on 04/11/2018 showed no evidence of malignancy.  -Lab and f/u in 4 months.   2. Chronic dysuria and left frank pain  -She has trouble completely emptying her bladder, which has been chronic for many years -I strongly encouraged her to have a PCP to help  monitor her health and I offer to refer her if she would like. She declines for now.   3. Smoking cessation  -We previously discussed health complications with smoking  -I strongly suggested her to stop and she has tried to stop smoking in the past  4. Left chest pain --She does have left chest wall pain near her surgical incision that is constant/mild and dull, likely secondary to surgery and radiation.  Exam was negative. I previously recommended her to use Tylenol as needed and to be more active. She knows she can reach out to he physical therapist if she wants to schedule more appointments.  -I advised that the patient to exercise, and return to physical therapy    5. Left hip pain -Discussed and offered to obtain a xray today to rule out any bony abnormalities. She declined  -Patient doesn't have a PCP at this time, I advised that the patient obtain a PCP at this time.  -Recommended referral to orthopedist, to which the patient declined today.  -Advised and recommended physical therapy to aid with pain, to which patient declined today.  -Patient advised that she can take tylenol or aleve PRN for pain control.  -Advised that that the patient take 800-1,000 mg daily of Vitamin D and up to 500-600 mg of Calcium as needed for bone pain.   PLAN:  -Continue Tamoxifen  -Lab and f/u in 4 months    No orders of the defined types were placed in this encounter.  All questions were answered. The patient knows to call the clinic with any problems, questions or concerns. I spent 10 minutes counseling the patient face to face. The total time spent in the appointment was 15 minutes and more than 50% was on counseling.    [name], MD 07/23/2018   I, Soijett Blue am acting as scribe for Dr. [name].  I have reviewed the above documentation for accuracy and completeness, and I agree with the above.

## 2018-07-23 ENCOUNTER — Other Ambulatory Visit: Payer: Self-pay | Admitting: Hematology

## 2018-07-23 ENCOUNTER — Inpatient Hospital Stay (HOSPITAL_BASED_OUTPATIENT_CLINIC_OR_DEPARTMENT_OTHER): Payer: BLUE CROSS/BLUE SHIELD | Admitting: Hematology

## 2018-07-23 ENCOUNTER — Encounter: Payer: Self-pay | Admitting: Hematology

## 2018-07-23 ENCOUNTER — Inpatient Hospital Stay: Payer: BLUE CROSS/BLUE SHIELD | Attending: Hematology

## 2018-07-23 ENCOUNTER — Telehealth: Payer: Self-pay | Admitting: Hematology

## 2018-07-23 VITALS — BP 121/68 | HR 61 | Temp 98.1°F | Resp 18 | Ht 63.0 in | Wt 126.2 lb

## 2018-07-23 DIAGNOSIS — Z170 Estrogen receptor positive status [ER+]: Secondary | ICD-10-CM | POA: Diagnosis not present

## 2018-07-23 DIAGNOSIS — Z780 Asymptomatic menopausal state: Secondary | ICD-10-CM

## 2018-07-23 DIAGNOSIS — Z87891 Personal history of nicotine dependence: Secondary | ICD-10-CM

## 2018-07-23 DIAGNOSIS — Z923 Personal history of irradiation: Secondary | ICD-10-CM | POA: Insufficient documentation

## 2018-07-23 DIAGNOSIS — C773 Secondary and unspecified malignant neoplasm of axilla and upper limb lymph nodes: Secondary | ICD-10-CM | POA: Insufficient documentation

## 2018-07-23 DIAGNOSIS — M25552 Pain in left hip: Secondary | ICD-10-CM | POA: Diagnosis not present

## 2018-07-23 DIAGNOSIS — C50312 Malignant neoplasm of lower-inner quadrant of left female breast: Secondary | ICD-10-CM

## 2018-07-23 DIAGNOSIS — R300 Dysuria: Secondary | ICD-10-CM

## 2018-07-23 DIAGNOSIS — R0789 Other chest pain: Secondary | ICD-10-CM

## 2018-07-23 DIAGNOSIS — C50212 Malignant neoplasm of upper-inner quadrant of left female breast: Secondary | ICD-10-CM | POA: Diagnosis not present

## 2018-07-23 DIAGNOSIS — Z79810 Long term (current) use of selective estrogen receptor modulators (SERMs): Secondary | ICD-10-CM | POA: Diagnosis not present

## 2018-07-23 DIAGNOSIS — Z9012 Acquired absence of left breast and nipple: Secondary | ICD-10-CM | POA: Diagnosis not present

## 2018-07-23 LAB — CMP (CANCER CENTER ONLY)
ALT: 7 U/L (ref 0–44)
AST: 12 U/L — ABNORMAL LOW (ref 15–41)
Albumin: 3.7 g/dL (ref 3.5–5.0)
Alkaline Phosphatase: 41 U/L (ref 38–126)
Anion gap: 7 (ref 5–15)
BUN: 10 mg/dL (ref 6–20)
CHLORIDE: 109 mmol/L (ref 98–111)
CO2: 27 mmol/L (ref 22–32)
CREATININE: 0.72 mg/dL (ref 0.44–1.00)
Calcium: 9.1 mg/dL (ref 8.9–10.3)
Glucose, Bld: 123 mg/dL — ABNORMAL HIGH (ref 70–99)
Potassium: 4 mmol/L (ref 3.5–5.1)
Sodium: 143 mmol/L (ref 135–145)
Total Bilirubin: 0.5 mg/dL (ref 0.3–1.2)
Total Protein: 6.7 g/dL (ref 6.5–8.1)

## 2018-07-23 LAB — CBC WITH DIFFERENTIAL (CANCER CENTER ONLY)
Basophils Absolute: 0 10*3/uL (ref 0.0–0.1)
Basophils Relative: 1 %
EOS PCT: 2 %
Eosinophils Absolute: 0.1 10*3/uL (ref 0.0–0.5)
HCT: 36.7 % (ref 34.8–46.6)
Hemoglobin: 12.2 g/dL (ref 11.6–15.9)
LYMPHS ABS: 1.1 10*3/uL (ref 0.9–3.3)
LYMPHS PCT: 36 %
MCH: 30.7 pg (ref 25.1–34.0)
MCHC: 33.3 g/dL (ref 31.5–36.0)
MCV: 92.2 fL (ref 79.5–101.0)
MONO ABS: 0.3 10*3/uL (ref 0.1–0.9)
MONOS PCT: 10 %
Neutro Abs: 1.6 10*3/uL (ref 1.5–6.5)
Neutrophils Relative %: 51 %
PLATELETS: 210 10*3/uL (ref 145–400)
RBC: 3.99 MIL/uL (ref 3.70–5.45)
RDW: 13.5 % (ref 11.2–14.5)
WBC Count: 3.1 10*3/uL — ABNORMAL LOW (ref 3.9–10.3)

## 2018-07-23 MED ORDER — TAMOXIFEN CITRATE 20 MG PO TABS: ORAL_TABLET | ORAL | 1 refills | Status: DC

## 2018-07-23 NOTE — Telephone Encounter (Signed)
Scheduled appt per 9/10 los - sent reminder letter in the mail - lab and f.u in 4 months.

## 2018-07-23 NOTE — Telephone Encounter (Signed)
Refill by Dr. Burr Medico 9/10

## 2018-07-24 LAB — FOLLICLE STIMULATING HORMONE: FSH: 15.4 m[IU]/mL

## 2018-07-24 LAB — ESTRADIOL

## 2018-09-30 IMAGING — MG 2D DIGITAL DIAGNOSTIC BILATERAL MAMMOGRAM WITH CAD AND ADJUNCT T
8 of 15 series · 8 of 35 positions shown · non-contrast
Comparison: Prior screening mammogram dated 02/26/2017.

CLINICAL DATA: 51-year-old female with a palpable left breast mass.
The patient states she has been feeling this area for a long time,
possibly a few years.

EXAM:
2D DIGITAL DIAGNOSTIC BILATERAL MAMMOGRAM WITH CAD AND ADJUNCT TOMO
LEFT BREAST ULTRASOUND

[R MLO synth-2D]
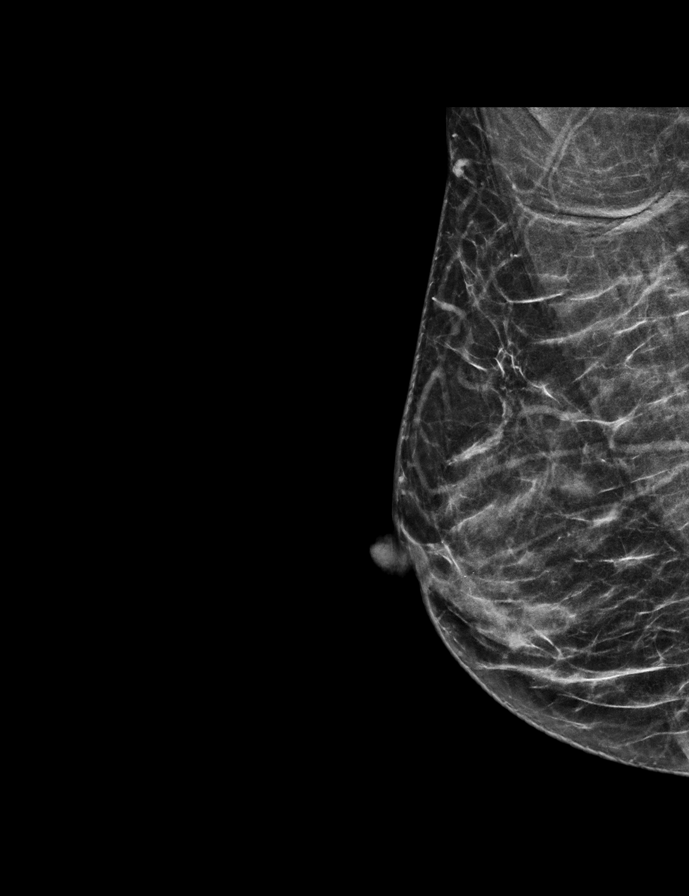

[L CC synth-2D (1 of 2)]
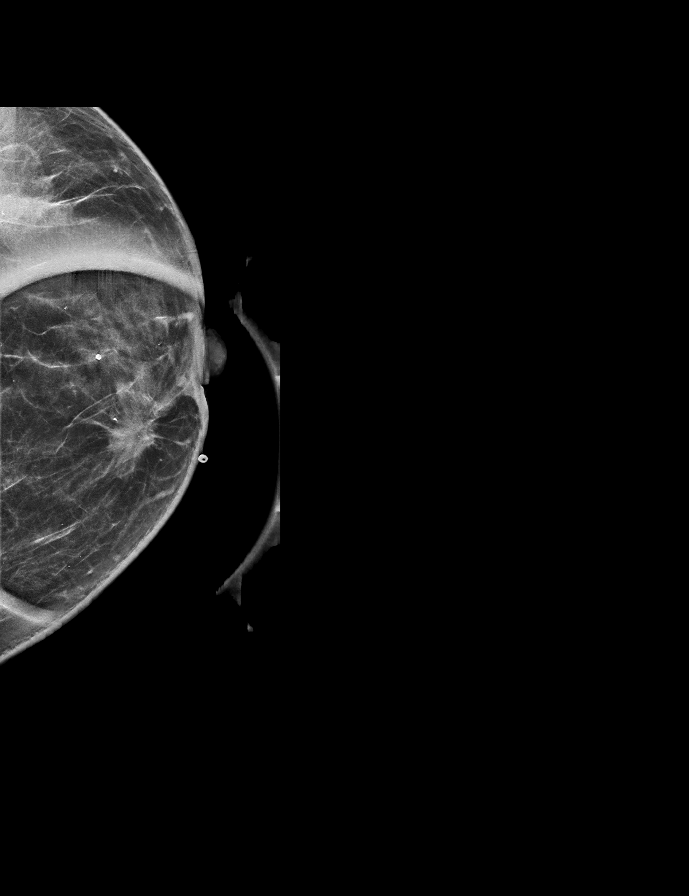

[L CC]
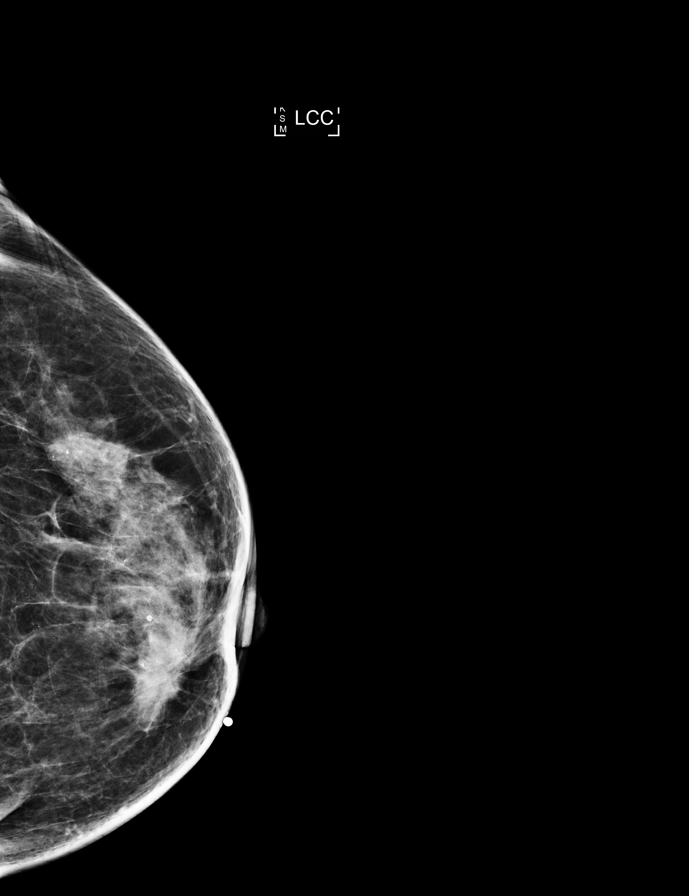

[L MLO]
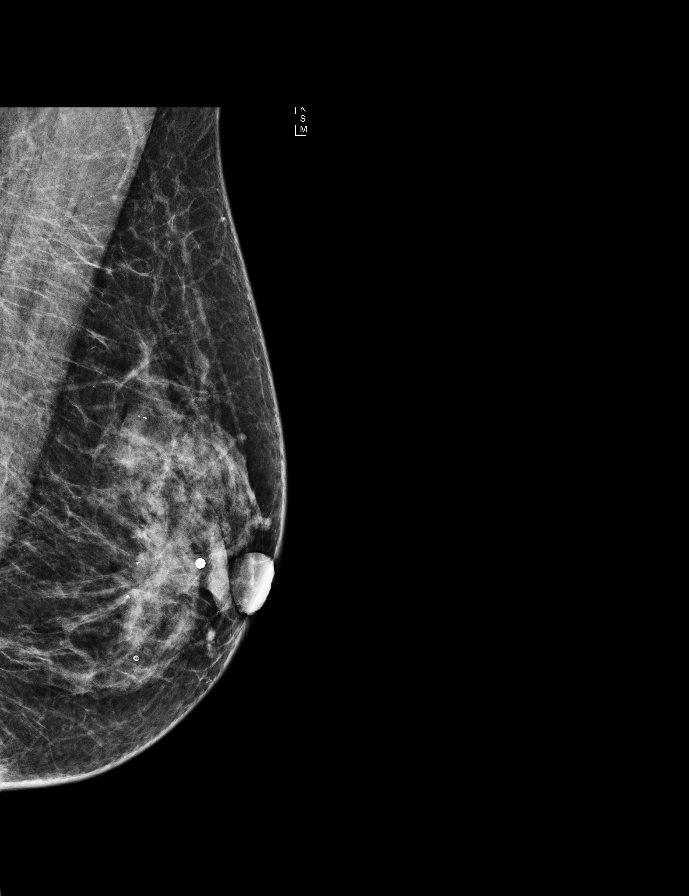

[R CC]
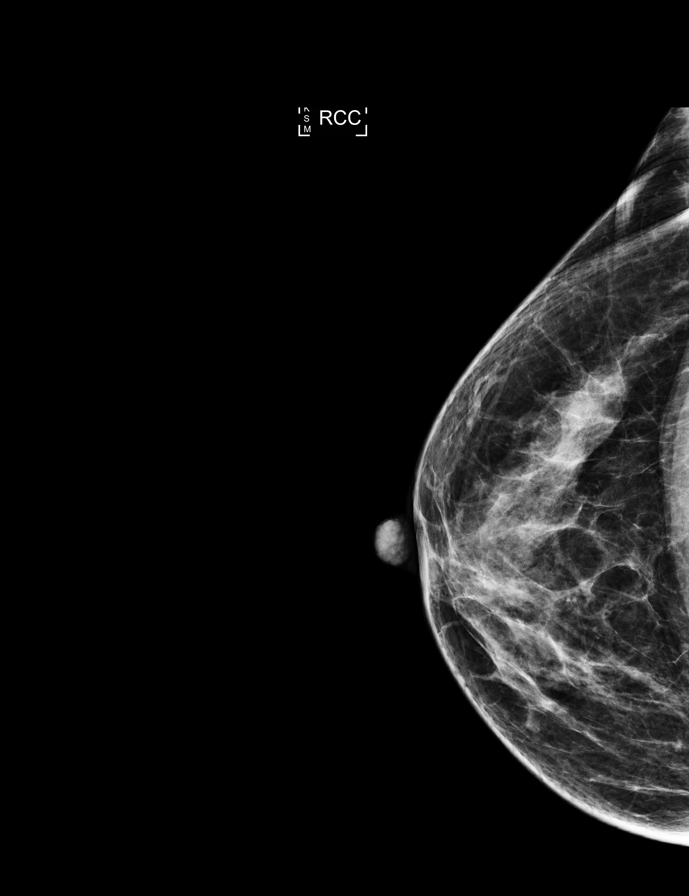

[L MLO synth-2D]
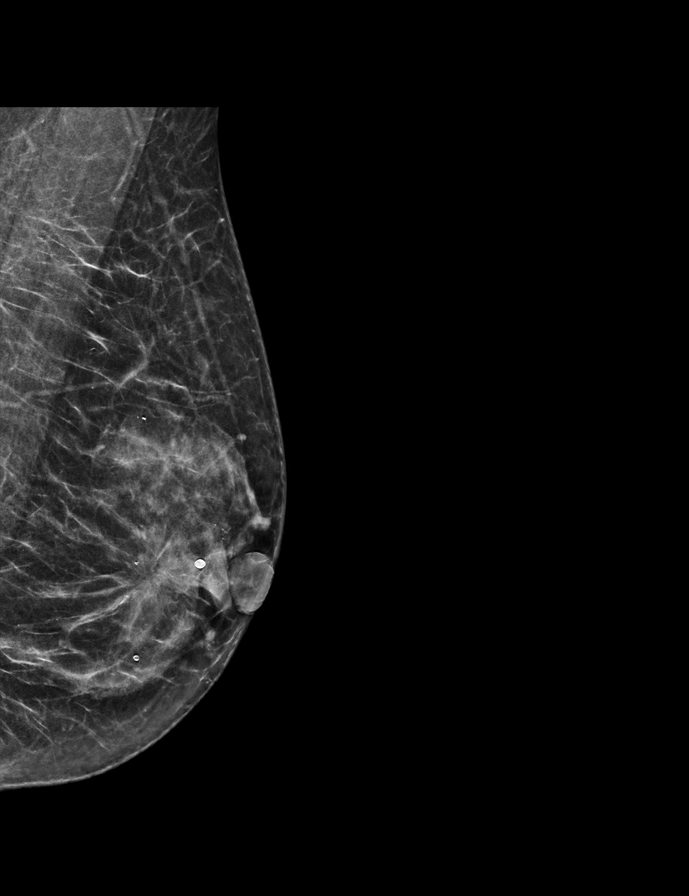

[R MLO]
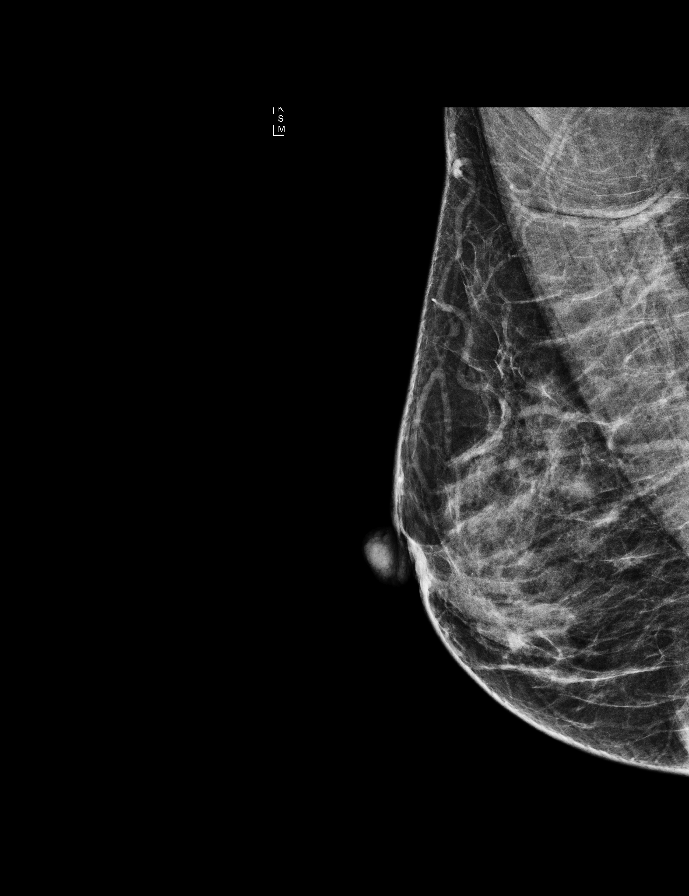

[L CC synth-2D (2 of 2)]
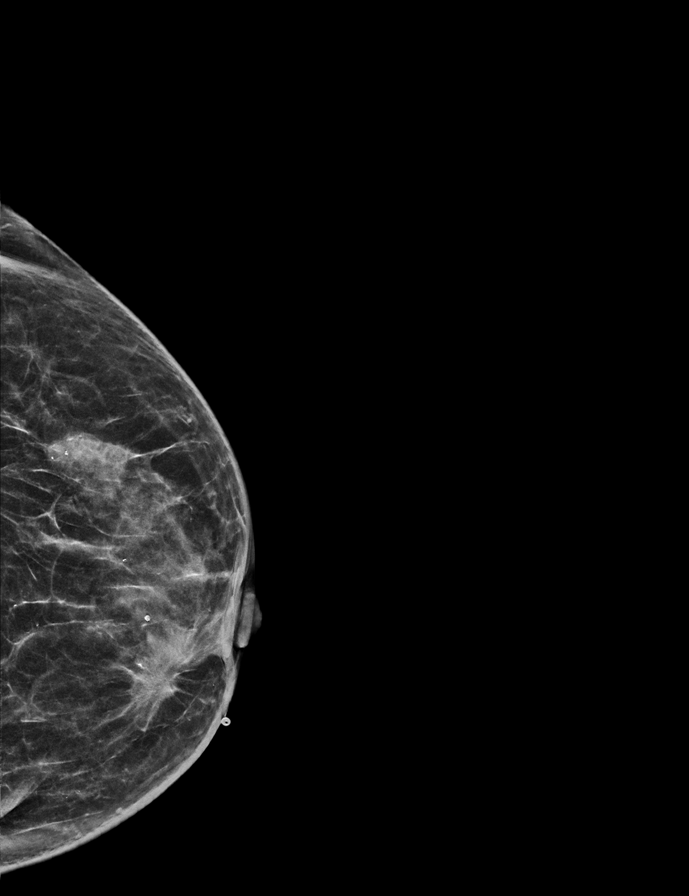

[8 of 35 positions shown; findings below may reference images not displayed]

ACR Breast Density Category c: The breast tissue is heterogeneously
dense, which may obscure small masses.
FINDINGS: No suspicious masses or calcifications are seen in the right breast.
There is an irregular mass with spiculated margins in the
periareolar/slightly inner left breast measuring approximately
cm. There is associated flattening/retraction of the nipple-areola
complex.

Mammographic images were processed with CAD.

Physical examination reveals a hard fixed mass visibly bulging the
contour of the inner left breast.

Targeted ultrasound of the left breast was performed demonstrating
an irregular hypoechoic shadowing mass at 9 o'clock 1 cm from the
nipple measuring 2.3 x 1 x 2.4 cm. This corresponds well with the
mass seen at mammography. No lymphadenopathy seen in the left
axilla.
IMPRESSION: Highly suspicious palpable left breast mass.

RECOMMENDATION:
Ultrasound-guided biopsy of the mass in the left breast is
recommended. This is scheduled for [REDACTED] 04/13/2017 at [DATE] a.m..

I have discussed the findings and recommendations with the patient.
Results were also provided in writing at the conclusion of the
visit. If applicable, a reminder letter will be sent to the patient
regarding the next appointment.

BI-RADS CATEGORY  5: Highly suggestive of malignancy.

## 2018-11-22 ENCOUNTER — Inpatient Hospital Stay: Payer: BLUE CROSS/BLUE SHIELD | Attending: Hematology | Admitting: Hematology

## 2018-11-22 ENCOUNTER — Inpatient Hospital Stay: Payer: BLUE CROSS/BLUE SHIELD

## 2018-12-04 ENCOUNTER — Telehealth: Payer: Self-pay | Admitting: Hematology

## 2018-12-04 NOTE — Telephone Encounter (Signed)
Patient called to reschedule  °

## 2018-12-27 NOTE — Progress Notes (Signed)
[place]   [phone]) [phone] [phone]) [phone]   Clinic Follow up Note   Patient Care Team: Patient, No Pcp Per as PCP - General ([place]) [name], MD as Consulting Physician (Hematology) Jovita Kussmaul, MD as Consulting Physician (General Surgery) Eppie Gibson, MD as Attending Physician (Radiation Oncology)  Date of Service:  12/30/2018  CHIEF COMPLAINT: F/u of left breast cancer   SUMMARY OF ONCOLOGIC HISTORY: Oncology History   Cancer Staging Malignant neoplasm of upper-inner quadrant of left breast in female, estrogen receptor positive ([place]) Staging form: Breast, AJCC 8th Edition - Clinical stage from 04/13/2017: Stage IIA (cT2, cN0, cM0, G2, ER: Positive, PR: Negative, HER2: Negative) - Signed by [name], MD on 04/25/2017 - Pathologic stage from 06/13/2017: Stage IIB (pT2, pN1a, cM0, G1, ER: Positive, PR: Negative, HER2: Negative) - Signed by [name], MD on 09/04/2017       Malignant neoplasm of upper-inner quadrant of left breast in female, estrogen receptor positive ([place])   04/10/2017 Mammogram    Left breast mammo and US showed an irregular hypoechoic shadowing mass at 9 o'clock 1 cm from the nipple measuring 2.3 x 1 x 2.4 cm. This corresponds well with the mass seen at mammography. No lymphadenopathy seen in the left axilla.    04/18/2017 Initial Diagnosis    Malignant neoplasm of upper-inner quadrant of left breast in female, estrogen receptor positive ([place])    04/22/2017 Receptors her2    Estrogen Receptor: 95%, POSITIVE, STRONG STAINING INTENSITY Progesterone Receptor: 0%, NEGATIVE Proliferation Marker Ki67: 10% HER2 -    04/22/2017 Initial Biopsy    Breast, left, needle core biopsy, 9:30 o'clock - INVASIVE DUCTAL CARCINOMA, G2 - DUCTAL CARCINOMA IN SITU.    06/13/2017 Surgery    Left breast mastectomy with sentinel lymph node biopsy performed by Dr Marlou Starks.     06/13/2017 Pathology Results    Breast, simple mastectomy, Left -  INVASIVE DUCTAL CARCINOMA, GRADE I/III, SPANNING 2.5 CM. - DUCTAL CARCINOMA IN SITU, INTERMEDIATE GRADE. - LOBULAR NEOPLASIA (ATYPICAL LOBULAR HYPERPLASIA). - THE SURGICAL RESECTION MARGINS ARE NEGATIVE FOR CARCINOMA.  Lymph node, sentinel, biopsy, Left axillary #1 - METASTATIC CARCINOMA IN 1 OF 3 LYMPH NODE (1/3), WITH EXTRACAPSULAR EXTENSION.    06/13/2017 Pathology Results    Mammaprint with low risk disease.     07/31/2017 - 09/10/2017 Radiation Therapy    The patient began a course of radiotherapy over the left chest wall and axillary area, supervised by Dr Eppie Gibson, MD.    Radiation treatment dates:   07/31/2017 - 09/10/2017  Site/dose:    1) Left Chest Wall / 50 Gy in 25 fractions 2) Left Supraclavicular / 45 Gy in 25 fractions 3) Left Chest Wall Scar Boost / 10 Gy in 5 fractions  Beams/energy:    1) Opposed tangents 3D/ 10 and 6 MV photons 2) Additional fields 3D / 10 MV and 6 MV photons 3) En face electrons / 6 MeV electrons  Narrative: The patient tolerated radiation treatment relatively well. She experienced mild fatigue. She reported pain, burning, and itching to the left breast and swelling under the left arm. On physical exam, she had dry peeling in the left UIQ of the breast and left axilla, as well as diffuse hyperpigmentation and erythema of the left chest wall. She is using the radiaplex cream to the treatment area and neosporin to the peeling areas.    10/13/2017 -  Anti-estrogen oral therapy    Tamoxifen 67m daily  04/11/2018 Mammogram    04/11/2018 Mammogram IMPRESSION: No mammographic evidence of malignancy. A result letter of this screening mammogram will be mailed directly to the patient.    04/11/2018 Mammogram    Screening Unilateral Right: No evidence of mammographic malignancy.       CURRENT THERAPY:  adjuvant Tamoxifen 30m daily starting 10/13/17   INTERVAL HISTORY:  Kristina YPollie Nicholson here for a follow up of left breast cancer.  She was last seen by me 5 months ago. She presents to the clinic today with her husband. She notes she is doing well. She notes left axillary stiffness. She did not go to PT after surgery and declined going. She notes pain with urination that is chronic. She has seen a urologist before but nothing was done. She denies any other pain. She does not have a PCP.      REVIEW OF SYSTEMS:   Constitutional: Denies fevers, chills or abnormal weight loss Eyes: Denies blurriness of vision Ears, nose, mouth, throat, and face: Denies mucositis or sore throat Respiratory: Denies cough, dyspnea or wheezes Cardiovascular: Denies palpitation, chest discomfort or lower extremity swelling Gastrointestinal:  Denies nausea, heartburn or change in bowel habits Skin: Denies abnormal skin rashes MSK: (+) stiffness of left axilla  Lymphatics: Denies new lymphadenopathy or easy bruising Neurological:Denies numbness, tingling or new weaknesses Behavioral/Psych: Mood is stable, no new changes  All other systems were reviewed with the patient and are negative.  MEDICAL HISTORY:  Past Medical History:  Diagnosis Date  . Breast mass    lt breast mass ? doesnt know how long  . Headache   . History of radiation therapy 07/31/17- 09/10/17   LEft chest wass 50 gy in 25 fractions, Left Supraclavicular 45 Gy in 25 fractions, Left Chest wall scar boost 10 Gy in 5 fractions.   . Malignant neoplasm of upper-inner quadrant of left breast in [place], [place] receptor positive [place] 04/18/2017  . Personal history of radiation therapy   . UTI (urinary tract infection)     SURGICAL HISTORY: Past Surgical History:  Procedure Laterality Date  . APPENDECTOMY    . COLONOSCOPY    . GANGLION CYST EXCISION Right 09/24/2017   Procedure: EXCISION OF RIGHT DORSAL WRIST GANGLION;  Surgeon: TMilly Jakob MD;  [facility]  Service: Orthopedics;  Laterality: Right;  . LIPOMA EXCISION Left 06/13/2017   Procedure:  EXCISION 3CM LIPOMA ON LEFT ARM;  Surgeon: TJovita Kussmaul MD;  [facility]  Service: General;  Laterality: Left;  .Marland KitchenMASTECTOMY Left 2018  . MASTECTOMY W/ SENTINEL NODE BIOPSY Left 06/13/2017   Procedure: MASTECTOMY WITH SENTINEL LYMPH NODE BIOPSY;  Surgeon: TJovita Kussmaul MD;  [facility]  Service: General;  Laterality: Left;    I have reviewed the social history and family history with the patient and they are unchanged from previous note.  ALLERGIES:  has No Known Allergies.  MEDICATIONS:  Current Outpatient Medications  Medication Sig Dispense Refill  . acetaminophen (TYLENOL) 325 MG tablet Take 2 tablets (650 mg total) every 6 (six) hours by mouth.    . benzocaine-Menthol (DERMOPLAST) 20-0.5 % AERO Apply 1 application topically daily as needed for irritation (itching).    . meloxicam (MOBIC) 15 MG tablet Take 15 mg daily by mouth.    . oxyCODONE (ROXICODONE) 5 MG immediate release tablet Take 1 tablet (5 mg total) every 6 (six) hours as needed by mouth for severe pain. 28 tablet 0  . tamoxifen (NOLVADEX)  20 MG tablet TAKE 1 TABLET(20 MG) BY MOUTH DAILY 90 tablet 1   No current facility-administered medications for this visit.     PHYSICAL EXAMINATION: ECOG PERFORMANCE STATUS: 1 - Symptomatic but completely ambulatory  Vitals:   12/30/18 0909  BP: 107/75  Pulse: 60  Resp: 17  Temp: 98.2 F (36.8 C)  SpO2: 100%   Filed Weights   12/30/18 0909  Weight: 126 lb 4.8 oz (57.3 kg)    GENERAL:alert, no distress and comfortable SKIN: skin color, texture, turgor are normal, no rashes or significant lesions EYES: normal, Conjunctiva are pink and non-injected, sclera clear OROPHARYNX:no exudate, no erythema and lips, buccal mucosa, and tongue normal  NECK: supple, thyroid normal size, non-tender, without nodularity LYMPH:  no palpable lymphadenopathy in the cervical, axillary or inguinal LUNGS: clear to auscultation and percussion with normal breathing effort HEART:  regular rate & rhythm and no murmurs and no lower extremity edema ABDOMEN:abdomen soft, non-tender and normal bowel sounds Musculoskeletal:no cyanosis of digits and no clubbing  NEURO: alert & oriented x 3 with fluent speech, no focal motor/sensory deficits BREAST: S/p left mastectomy: Surgical incision healed well (+) No palpable mass or adenopathy   LABORATORY DATA:  I have reviewed the data as listed CBC Latest Ref Rng & Units 12/30/2018 07/23/2018 03/11/2018  WBC 4.0 - 10.5 K/uL 4.4 3.1(L) 3.9  Hemoglobin 12.0 - 15.0 g/dL 12.0 12.2 12.3  Hematocrit 36.0 - 46.0 % 37.1 36.7 37.9  Platelets 150 - 400 K/uL 215 210 207     CMP Latest Ref Rng & Units 12/30/2018 07/23/2018 03/11/2018  Glucose 70 - 99 mg/dL 88 123(H) 103  BUN 6 - 20 mg/dL 8 10 12   Creatinine 0.44 - 1.00 mg/dL 0.76 0.72 0.72  Sodium 135 - 145 mmol/L 144 143 141  Potassium 3.5 - 5.1 mmol/L 4.4 4.0 3.8  Chloride 98 - 111 mmol/L 108 109 108  CO2 22 - 32 mmol/L 28 27 25   Calcium 8.9 - 10.3 mg/dL 8.9 9.1 9.4  Total Protein 6.5 - 8.1 g/dL 6.5 6.7 6.9  Total Bilirubin 0.3 - 1.2 mg/dL 0.5 0.5 0.3  Alkaline Phos 38 - 126 U/L 51 41 52  AST 15 - 41 U/L 13(L) 12(L) 12  ALT 0 - 44 U/L 10 7 10       RADIOGRAPHIC STUDIES: I have personally reviewed the radiological images as listed and agreed with the findings in the report. No results found.   ASSESSMENT & PLAN:  Kristina Nicholson is a 53 y.o. female with   1. Malignant neoplasm of upper-inner quadrant of left breast in female, [name], stage IIB, ER: Positive, PR: Negative, HER2: Negative, Grade 3 -She was diagnosed in 04/2017. She is s/p left breast mastectomy and adjuvant radiation.  --Her mammaprint showed low risk disease, adjuvant chemotherapy was not recommended. -She started antiestrogen therapy with Tamoxifen in 10/2017. She is tolerating well, plan for 10 year. If she becomes postmenopausal in the next few years, I will probably switch her to aromatase inhibitor for  additional 5 years.  -She is clinically doing well. Lab reviewed, her CBC and CMP WNL. Her physical exam and her 03/2018 right breast mammogram were unremarkable. There is no clinical concern for recurrence. -I encouraged her to start daily [phone] units of Vitamin D.  -I strongly encouraged her to have a PCP or Gyn to follow up regularly for check ups, up to date pap smears and health maintenance to prevent health issues. She declined PCP but is  open to seeing a gyn.  -F/u in 6 months, continue breast cancer surveillance, next mammogram due in May 2020  2. 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 No treatment was given.  -Patient states she had CT scan of her abdomen and pelvis which was unremarkable.  3. Smoking cessation   -We previously discussed health complications with smoking  -I strongly suggested her to stop and she has tried to stop smoking in the past  4. Left chest pain  -She does have post surgery chest pain and axillary stiffness.  -I advised that the patient to exercise as she has declined PT.   5. Fatigue  -She remains tired after sleeping and this has been going on for a year, but not effecting her activity level.  -She does not have a PCP so I will test her Thyroid function on next visit     PLAN:  -Continue Tamoxifen  -next mammogram in 03/2019, ordered today  -Lab and f/u in 6 months      No problem-specific Assessment & Plan notes found for this encounter.   Orders Placed This Encounter  Procedures  . MM DIAG BREAST TOMO UNI RIGHT    Standing Status:   Future    Standing Expiration Date:   12/31/2019    Order Specific Question:   Reason for Exam (SYMPTOM  OR DIAGNOSIS REQUIRED)    Answer:   screening    Order Specific Question:   Is the patient pregnant?    Answer:   No    Order  Specific Question:   Preferred imaging location?    Answer:   [facility]  . TSH    Standing Status:   Future    Standing Expiration Date:   12/31/2019   All questions were answered. The patient knows to call the clinic with any problems, questions or concerns. No barriers to learning was detected. I spent 15 minutes counseling the patient face to face. The total time spent in the appointment was 20 minutes and more than 50% was on counseling and review of test results     [name], MD 12/30/2018   I, Joslyn Devon, am acting as scribe for [name], MD.   I have reviewed the above documentation for accuracy and completeness, and I agree with the above.

## 2018-12-30 ENCOUNTER — Telehealth: Payer: Self-pay | Admitting: Hematology

## 2018-12-30 ENCOUNTER — Encounter: Payer: Self-pay | Admitting: Hematology

## 2018-12-30 ENCOUNTER — Inpatient Hospital Stay (HOSPITAL_BASED_OUTPATIENT_CLINIC_OR_DEPARTMENT_OTHER): Payer: BLUE CROSS/BLUE SHIELD | Admitting: Hematology

## 2018-12-30 ENCOUNTER — Inpatient Hospital Stay: Payer: BLUE CROSS/BLUE SHIELD | Attending: Hematology

## 2018-12-30 VITALS — BP 107/75 | HR 60 | Temp 98.2°F | Resp 17 | Ht 63.0 in | Wt 126.3 lb

## 2018-12-30 DIAGNOSIS — C50212 Malignant neoplasm of upper-inner quadrant of left female breast: Secondary | ICD-10-CM | POA: Diagnosis not present

## 2018-12-30 DIAGNOSIS — Z923 Personal history of irradiation: Secondary | ICD-10-CM

## 2018-12-30 DIAGNOSIS — Z170 Estrogen receptor positive status [ER+]: Secondary | ICD-10-CM

## 2018-12-30 DIAGNOSIS — Z79810 Long term (current) use of selective estrogen receptor modulators (SERMs): Secondary | ICD-10-CM

## 2018-12-30 DIAGNOSIS — Z9012 Acquired absence of left breast and nipple: Secondary | ICD-10-CM

## 2018-12-30 DIAGNOSIS — C50312 Malignant neoplasm of lower-inner quadrant of left female breast: Secondary | ICD-10-CM

## 2018-12-30 DIAGNOSIS — R5383 Other fatigue: Secondary | ICD-10-CM

## 2018-12-30 LAB — CMP (CANCER CENTER ONLY)
ALK PHOS: 51 U/L (ref 38–126)
ALT: 10 U/L (ref 0–44)
AST: 13 U/L — AB (ref 15–41)
Albumin: 3.7 g/dL (ref 3.5–5.0)
Anion gap: 8 (ref 5–15)
BILIRUBIN TOTAL: 0.5 mg/dL (ref 0.3–1.2)
BUN: 8 mg/dL (ref 6–20)
CO2: 28 mmol/L (ref 22–32)
CREATININE: 0.76 mg/dL (ref 0.44–1.00)
Calcium: 8.9 mg/dL (ref 8.9–10.3)
Chloride: 108 mmol/L (ref 98–111)
GFR, Est AFR Am: >60 mL/min (ref >60)
GLUCOSE: 88 mg/dL (ref 70–99)
Potassium: 4.4 mmol/L (ref 3.5–5.1)
Sodium: 144 mmol/L (ref 135–145)
TOTAL PROTEIN: 6.5 g/dL (ref 6.5–8.1)

## 2018-12-30 LAB — CBC WITH DIFFERENTIAL (CANCER CENTER ONLY)
Abs Immature Granulocytes: 0.01 10*3/uL (ref 0.00–0.07)
BASOS PCT: 1 %
Basophils Absolute: 0 10*3/uL (ref 0.0–0.1)
EOS PCT: 5 %
Eosinophils Absolute: 0.2 10*3/uL (ref 0.0–0.5)
HCT: 37.1 % (ref 36.0–46.0)
HEMOGLOBIN: 12 g/dL (ref 12.0–15.0)
Immature Granulocytes: 0 %
Lymphocytes Relative: 32 %
Lymphs Abs: 1.4 10*3/uL (ref 0.7–4.0)
MCH: 30.5 pg (ref 26.0–34.0)
MCHC: 32.3 g/dL (ref 30.0–36.0)
MCV: 94.2 fL (ref 80.0–100.0)
MONO ABS: 0.4 10*3/uL (ref 0.1–1.0)
MONOS PCT: 9 %
Neutro Abs: 2.3 10*3/uL (ref 1.7–7.7)
Neutrophils Relative %: 53 %
Platelet Count: 215 10*3/uL (ref 150–400)
RBC: 3.94 MIL/uL (ref 3.87–5.11)
RDW: 12.7 % (ref 11.5–15.5)
WBC: 4.4 10*3/uL (ref 4.0–10.5)
nRBC: 0 % (ref 0.0–0.2)

## 2018-12-30 MED ORDER — TAMOXIFEN CITRATE 20 MG PO TABS: ORAL_TABLET | ORAL | 1 refills | Status: DC

## 2018-12-30 NOTE — Telephone Encounter (Signed)
Scheduled appt per 2/17 los.  Printed and mailed calendar.

## 2019-01-21 DIAGNOSIS — M545 Low back pain: Secondary | ICD-10-CM | POA: Diagnosis not present

## 2019-01-21 DIAGNOSIS — N390 Urinary tract infection, site not specified: Secondary | ICD-10-CM | POA: Diagnosis not present

## 2019-04-14 ENCOUNTER — Ambulatory Visit: Payer: BLUE CROSS/BLUE SHIELD

## 2019-05-16 ENCOUNTER — Other Ambulatory Visit: Payer: Self-pay | Admitting: Hematology

## 2019-05-16 DIAGNOSIS — C50312 Malignant neoplasm of lower-inner quadrant of left female breast: Secondary | ICD-10-CM

## 2019-05-16 DIAGNOSIS — Z170 Estrogen receptor positive status [ER+]: Secondary | ICD-10-CM

## 2019-05-16 DIAGNOSIS — C50212 Malignant neoplasm of upper-inner quadrant of left female breast: Secondary | ICD-10-CM

## 2019-05-28 DIAGNOSIS — Z1231 Encounter for screening mammogram for malignant neoplasm of breast: Secondary | ICD-10-CM | POA: Diagnosis not present

## 2019-05-28 DIAGNOSIS — Z6821 Body mass index (BMI) 21.0-21.9, adult: Secondary | ICD-10-CM | POA: Diagnosis not present

## 2019-05-28 DIAGNOSIS — Z01419 Encounter for gynecological examination (general) (routine) without abnormal findings: Secondary | ICD-10-CM | POA: Diagnosis not present

## 2019-06-23 ENCOUNTER — Telehealth: Payer: Self-pay | Admitting: Hematology

## 2019-06-23 NOTE — Telephone Encounter (Signed)
Called pt per 8/09 sch message - unable to reach pt - left message for patient to call back if they would like to change appt on 8/17 to a virtual visit.

## 2019-06-30 ENCOUNTER — Other Ambulatory Visit: Payer: BLUE CROSS/BLUE SHIELD

## 2019-06-30 ENCOUNTER — Ambulatory Visit: Payer: BLUE CROSS/BLUE SHIELD | Admitting: Hematology

## 2019-06-30 ENCOUNTER — Telehealth: Payer: Self-pay | Admitting: Hematology

## 2019-06-30 NOTE — Telephone Encounter (Signed)
R/s appt per 8/17 sch message - pt husband aware of new appt date and time

## 2019-07-11 NOTE — Progress Notes (Signed)
[place]   Telephone:([phone]) [phone] Fax:(336) [phone]   Clinic Follow up Note   Patient Care Team: Patient, No Pcp Per as PCP - General ([place]) [name], MD as Consulting Physician (Hematology) Jovita Kussmaul, MD as Consulting Physician (General Surgery) Eppie Gibson, MD as Attending Physician (Radiation Oncology)  Date of Service:  07/14/2019  CHIEF COMPLAINT: F/u of left breast cancer   SUMMARY OF ONCOLOGIC HISTORY: Oncology History Overview Note  Cancer Staging Malignant neoplasm of upper-inner quadrant of left breast in female, estrogen receptor positive ([place]) Staging form: Breast, AJCC 8th Edition - Clinical stage from 04/13/2017: Stage IIA (cT2, cN0, cM0, G2, ER: Positive, PR: Negative, HER2: Negative) - Signed by [name], MD on 04/25/2017 - Pathologic stage from 06/13/2017: Stage IIB (pT2, pN1a, cM0, G1, ER: Positive, PR: Negative, HER2: Negative) - Signed by [name], MD on 09/04/2017     Malignant neoplasm of upper-inner quadrant of left breast in female, estrogen receptor positive ([place])  04/10/2017 Mammogram   Left breast mammo and US showed an irregular hypoechoic shadowing mass at 9 o'clock 1 cm from the nipple measuring 2.3 x 1 x 2.4 cm. This corresponds well with the mass seen at mammography. No lymphadenopathy seen in the left axilla.   04/18/2017 Initial Diagnosis   Malignant neoplasm of upper-inner quadrant of left breast in female, estrogen receptor positive ([place])   04/22/2017 Receptors her2   Estrogen Receptor: 95%, POSITIVE, STRONG STAINING INTENSITY Progesterone Receptor: 0%, NEGATIVE Proliferation Marker Ki67: 10% HER2 -   04/22/2017 Initial Biopsy   Breast, left, needle core biopsy, 9:30 o'clock - INVASIVE DUCTAL CARCINOMA, G2 - DUCTAL CARCINOMA IN SITU.   06/13/2017 Surgery   Left breast mastectomy with sentinel lymph node biopsy performed by Dr Marlou Starks.    06/13/2017 Pathology Results   Breast, simple mastectomy, Left -  INVASIVE DUCTAL CARCINOMA, GRADE I/III, SPANNING 2.5 CM. - DUCTAL CARCINOMA IN SITU, INTERMEDIATE GRADE. - LOBULAR NEOPLASIA (ATYPICAL LOBULAR HYPERPLASIA). - THE SURGICAL RESECTION MARGINS ARE NEGATIVE FOR CARCINOMA.  Lymph node, sentinel, biopsy, Left axillary #1 - METASTATIC CARCINOMA IN 1 OF 3 LYMPH NODE (1/3), WITH EXTRACAPSULAR EXTENSION.   06/13/2017 Pathology Results   Mammaprint with low risk disease.    07/31/2017 - 09/10/2017 Radiation Therapy   The patient began a course of radiotherapy over the left chest wall and axillary area, supervised by Dr Eppie Gibson, MD.    Radiation treatment dates:   07/31/2017 - 09/10/2017  Site/dose:    1) Left Chest Wall / 50 Gy in 25 fractions 2) Left Supraclavicular / 45 Gy in 25 fractions 3) Left Chest Wall Scar Boost / 10 Gy in 5 fractions  Beams/energy:    1) Opposed tangents 3D/ 10 and 6 MV photons 2) Additional fields 3D / 10 MV and 6 MV photons 3) En face electrons / 6 MeV electrons  Narrative: The patient tolerated radiation treatment relatively well. She experienced mild fatigue. She reported pain, burning, and itching to the left breast and swelling under the left arm. On physical exam, she had dry peeling in the left UIQ of the breast and left axilla, as well as diffuse hyperpigmentation and erythema of the left chest wall. She is using the radiaplex cream to the treatment area and neosporin to the peeling areas.   10/13/2017 -  Anti-estrogen oral therapy   Tamoxifen 50m daily   04/11/2018 Mammogram   04/11/2018 Mammogram IMPRESSION: No mammographic evidence of malignancy. A result letter of this screening mammogram  will be mailed directly to the patient.   04/11/2018 Mammogram   Screening Unilateral Right: No evidence of mammographic malignancy.       CURRENT THERAPY:  adjuvant Tamoxifen 38m daily starting 10/13/17   INTERVAL HISTORY:  Kristina Nicholson here for a follow up of left breast cancer. She was last  seen by me 6 months ago. She presents to the clinic alone. She notes her left axillary pain (3-4/10) is constant. She has normal ROM. She will take ibuprofen. She notes this has been going on since her surgery. She also notes left lower back pain from kidney issues, this is chronic and not new to her. She notes she urinates little at a time. She notes she is taking Tamoxifen with no issues. Her last period was before her surgery in 2018.  She notes she [facility]. It was normal according to her.   REVIEW OF SYSTEMS:   Constitutional: Denies fevers, chills or abnormal weight loss Eyes: Denies blurriness of vision Ears, nose, mouth, throat, and face: Denies mucositis or sore throat Respiratory: Denies cough, dyspnea or wheezes Cardiovascular: Denies palpitation, chest discomfort or lower extremity swelling Gastrointestinal:  Denies nausea, heartburn or change in bowel habits Skin: Denies abnormal skin rashes MSK: (+) Left lower back pain, stable  Lymphatics: Denies new lymphadenopathy or easy bruising Neurological:Denies numbness, tingling or new weaknesses Behavioral/Psych: Mood is stable, no new changes  Breast: (+) Left axillary pain, stable  All other systems were reviewed with the patient and are negative.  MEDICAL HISTORY:  Past Medical History:  Diagnosis Date  . Breast mass    lt breast mass ? doesnt know how long  . Headache   . History of radiation therapy 07/31/17- 09/10/17   LEft chest wass 50 gy in 25 fractions, Left Supraclavicular 45 Gy in 25 fractions, Left Chest wall scar boost 10 Gy in 5 fractions.   . Malignant neoplasm of upper-inner quadrant of left breast in female, estrogen receptor positive ([place] 04/18/2017  . Personal history of radiation therapy   . UTI (urinary tract infection)     SURGICAL HISTORY: Past Surgical History:  Procedure Laterality Date  . APPENDECTOMY    . COLONOSCOPY    . GANGLION CYST EXCISION Right 09/24/2017    Procedure: EXCISION OF RIGHT DORSAL WRIST GANGLION;  Surgeon: TMilly Jakob MD;  [facility]  Service: Orthopedics;  Laterality: Right;  . LIPOMA EXCISION Left 06/13/2017   Procedure: EXCISION 3CM LIPOMA ON LEFT ARM;  Surgeon: TJovita Kussmaul MD;  [facility]  Service: General;  Laterality: Left;  .Marland KitchenMASTECTOMY Left 2018  . MASTECTOMY W/ SENTINEL NODE BIOPSY Left 06/13/2017   Procedure: MASTECTOMY WITH SENTINEL LYMPH NODE BIOPSY;  Surgeon: TJovita Kussmaul MD;  [facility]  Service: General;  Laterality: Left;    I have reviewed the social history and family history with the patient and they are unchanged from previous note.  ALLERGIES:  has No Known Allergies.  MEDICATIONS:  Current Outpatient Medications  Medication Sig Dispense Refill  . acetaminophen (TYLENOL) 325 MG tablet Take 2 tablets (650 mg total) every 6 (six) hours by mouth.    . benzocaine-Menthol (DERMOPLAST) 20-0.5 % AERO Apply 1 application topically daily as needed for irritation (itching).    . meloxicam (MOBIC) 15 MG tablet Take 15 mg daily by mouth.    . oxyCODONE (ROXICODONE) 5 MG immediate release tablet Take 1 tablet (5 mg total) every  6 (six) hours as needed by mouth for severe pain. 28 tablet 0  . tamoxifen (NOLVADEX) 20 MG tablet TAKE 1 TABLET(20 MG) BY MOUTH DAILY 90 tablet 1   No current facility-administered medications for this visit.     PHYSICAL EXAMINATION: ECOG PERFORMANCE STATUS: 1 - Symptomatic but completely ambulatory  Vitals:   07/14/19 0903  BP: (!) 144/5  Pulse: 80  Resp: 20  Temp: 98.3 F (36.8 C)  SpO2: 98%   Filed Weights   07/14/19 0903  Weight: 128 lb 11.2 oz (58.4 kg)    GENERAL:alert, no distress and comfortable SKIN: skin color, texture, turgor are normal, no rashes or significant lesions EYES: normal, Conjunctiva are pink and non-injected, sclera clear  NECK: supple, thyroid normal size, non-tender, without nodularity LYMPH:  no palpable  lymphadenopathy in the cervical, axillary  LUNGS: clear to auscultation and percussion with normal breathing effort HEART: regular rate & rhythm and no murmurs and no lower extremity edema ABDOMEN:abdomen soft, non-tender and normal bowel sounds Musculoskeletal:no cyanosis of digits and no clubbing  NEURO: alert & oriented x 3 with fluent speech, no focal motor/sensory deficits BREAST: S/p left mastectomy: Surgical incisions healed well. No palpable mass, nodules or adenopathy bilaterally. Right breast exam benign.   LABORATORY DATA:  I have reviewed the data as listed CBC Latest Ref Rng & Units 07/14/2019 12/30/2018 07/23/2018  WBC 4.0 - 10.5 K/uL 4.8 4.4 3.1(L)  Hemoglobin 12.0 - 15.0 g/dL 12.8 12.0 12.2  Hematocrit 36.0 - 46.0 % 39.0 37.1 36.7  Platelets 150 - 400 K/uL 233 215 210     CMP Latest Ref Rng & Units 07/14/2019 12/30/2018 07/23/2018  Glucose 70 - 99 mg/dL 136(H) 88 123(H)  BUN 6 - 20 mg/dL _0 Creatinine 0.44 - 1.00 mg/dL 0.78 0.76 0.72  Sodium 135 - 145 mmol/L 142 144 143  Potassium 3.5 - 5.1 mmol/L 3.9 4.4 4.0  Chloride 98 - 111 mmol/L 106 108 109  CO2 22 - 32 mmol/L _1 Calcium 8.9 - 10.3 mg/dL 9.0 8.9 9.1  Total Protein 6.5 - 8.1 g/dL 6.9 6.5 6.7  Total Bilirubin 0.3 - 1.2 mg/dL 0.4 0.5 0.5  Alkaline Phos 38 - 126 U/L 41 51 41  AST 15 - 41 U/L 15 13(L) 12(L)  ALT 0 - 44 U/L _2 RADIOGRAPHIC STUDIES: I have personally reviewed the radiological images as listed and agreed with the findings in the report. No results found.   ASSESSMENT & PLAN:  Kristina Nicholson is a 53 y.o. female with   1. Malignant neoplasm of upper-inner quadrant of left breast in female, [name], stage IIB, ER: Positive, PR: Negative, HER2: Negative, Grade 3 -She was diagnosed in 04/2017. She is s/p left breast mastectomy and adjuvant radiation.  -Her mammaprint showed low risk disease, adjuvant chemotherapy was not recommended. -She started antiestrogen therapy with  Tamoxifen in 10/2017. She is tolerating well, plan for 10 year. If she becomes postmenopausal in the next few years, I will probably switch her to aromatase inhibitor for additional 5 years.  -She is clinically stable. She continues to have left axillary pain since surgery. Labs reviewed, CBC and CMP WNL. TSH still pending. Physical exam was unremarkable. Per patient her 2020 mammogram was done at [facility], was normal per pt. I will request report. There is no clinical concern for recurrence.  -Her last period was in 2018. I will check her [facility] and  Estradial levels to determine if she is postmenopausal and eligible for aromatase inhibitor. -F/u in 6 months, continue breast cancer surveillance and Tamoxifen   2. 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 No treatment was given.  -Patient states she had CT scan of her abdomen and pelvis which was unremarkable. -stable   3. Smoking cessation   -We previously discussed health complications with smoking  -I strongly suggested her to stop and she has tried to stop smoking in the past  4. Left chest pain  -She does have post surgery chest pain and axillary stiffness.  -I advised that the patienttoexercise as she has declined PT.  -Her left axillary pain is stable. I discussed this could be nerve damage from her surgery.   5. Fatigue  -She remains tired after sleeping and this has been going on for a year, but not effecting her activity level.  -TSH level today is normal today (07/14/19)    PLAN:  -She is clinically doing well -Continue Tamoxifen, will check [place] on next visit  -Request her 2020 mammogram from [facility]  -Lab and f/u in 6 months -I spoke with her husband on the phone today    No problem-specific Assessment & Plan notes found for this  encounter.   No orders of the defined types were placed in this encounter.  All questions were answered. The patient knows to call the clinic with any problems, questions or concerns. No barriers to learning was detected. I spent 15 minutes counseling the patient face to face. The total time spent in the appointment was 20 minutes and more than 50% was on counseling and review of test results     [name], MD 07/14/2019   I, Joslyn Devon, am acting as scribe for [name], MD.   I have reviewed the above documentation for accuracy and completeness, and I agree with the above.

## 2019-07-14 ENCOUNTER — Encounter: Payer: Self-pay | Admitting: Hematology

## 2019-07-14 ENCOUNTER — Inpatient Hospital Stay (HOSPITAL_BASED_OUTPATIENT_CLINIC_OR_DEPARTMENT_OTHER): Payer: BC Managed Care – PPO | Admitting: Hematology

## 2019-07-14 ENCOUNTER — Telehealth: Payer: Self-pay | Admitting: Hematology

## 2019-07-14 ENCOUNTER — Other Ambulatory Visit: Payer: Self-pay

## 2019-07-14 ENCOUNTER — Inpatient Hospital Stay: Payer: BC Managed Care – PPO | Attending: Hematology

## 2019-07-14 VITALS — BP 144/5 | HR 80 | Temp 98.3°F | Resp 20 | Ht 63.0 in | Wt 128.7 lb

## 2019-07-14 DIAGNOSIS — R300 Dysuria: Secondary | ICD-10-CM | POA: Diagnosis not present

## 2019-07-14 DIAGNOSIS — C50212 Malignant neoplasm of upper-inner quadrant of left female breast: Secondary | ICD-10-CM

## 2019-07-14 DIAGNOSIS — R0789 Other chest pain: Secondary | ICD-10-CM | POA: Insufficient documentation

## 2019-07-14 DIAGNOSIS — F17210 Nicotine dependence, cigarettes, uncomplicated: Secondary | ICD-10-CM | POA: Diagnosis not present

## 2019-07-14 DIAGNOSIS — Z170 Estrogen receptor positive status [ER+]: Secondary | ICD-10-CM | POA: Insufficient documentation

## 2019-07-14 DIAGNOSIS — R5383 Other fatigue: Secondary | ICD-10-CM

## 2019-07-14 DIAGNOSIS — Z9012 Acquired absence of left breast and nipple: Secondary | ICD-10-CM | POA: Insufficient documentation

## 2019-07-14 DIAGNOSIS — Z79810 Long term (current) use of selective estrogen receptor modulators (SERMs): Secondary | ICD-10-CM | POA: Diagnosis not present

## 2019-07-14 DIAGNOSIS — Z923 Personal history of irradiation: Secondary | ICD-10-CM | POA: Diagnosis not present

## 2019-07-14 DIAGNOSIS — M79622 Pain in left upper arm: Secondary | ICD-10-CM | POA: Insufficient documentation

## 2019-07-14 DIAGNOSIS — C50312 Malignant neoplasm of lower-inner quadrant of left female breast: Secondary | ICD-10-CM

## 2019-07-14 LAB — CBC WITH DIFFERENTIAL (CANCER CENTER ONLY)
Abs Immature Granulocytes: 0.01 10*3/uL (ref 0.00–0.07)
Basophils Absolute: 0 10*3/uL (ref 0.0–0.1)
Basophils Relative: 0 %
Eosinophils Absolute: 0.1 10*3/uL (ref 0.0–0.5)
Eosinophils Relative: 2 %
HCT: 39 % (ref 36.0–46.0)
Hemoglobin: 12.8 g/dL (ref 12.0–15.0)
Immature Granulocytes: 0 %
Lymphocytes Relative: 37 %
Lymphs Abs: 1.8 10*3/uL (ref 0.7–4.0)
MCH: 30.6 pg (ref 26.0–34.0)
MCHC: 32.8 g/dL (ref 30.0–36.0)
MCV: 93.3 fL (ref 80.0–100.0)
Monocytes Absolute: 0.4 10*3/uL (ref 0.1–1.0)
Monocytes Relative: 9 %
Neutro Abs: 2.5 10*3/uL (ref 1.7–7.7)
Neutrophils Relative %: 52 %
Platelet Count: 233 10*3/uL (ref 150–400)
RBC: 4.18 MIL/uL (ref 3.87–5.11)
RDW: 12.6 % (ref 11.5–15.5)
WBC Count: 4.8 10*3/uL (ref 4.0–10.5)
nRBC: 0 % (ref 0.0–0.2)

## 2019-07-14 LAB — CMP (CANCER CENTER ONLY)
ALT: 13 U/L (ref 0–44)
AST: 15 U/L (ref 15–41)
Albumin: 4 g/dL (ref 3.5–5.0)
Alkaline Phosphatase: 41 U/L (ref 38–126)
Anion gap: 12 (ref 5–15)
BUN: 9 mg/dL (ref 6–20)
CO2: 24 mmol/L (ref 22–32)
Calcium: 9 mg/dL (ref 8.9–10.3)
Chloride: 106 mmol/L (ref 98–111)
Creatinine: 0.78 mg/dL (ref 0.44–1.00)
GFR, Est AFR Am: >60 mL/min (ref >60)
GFR, Estimated: >60 mL/min (ref >60)
Glucose, Bld: 136 mg/dL — ABNORMAL HIGH (ref 70–99)
Potassium: 3.9 mmol/L (ref 3.5–5.1)
Sodium: 142 mmol/L (ref 135–145)
Total Bilirubin: 0.4 mg/dL (ref 0.3–1.2)
Total Protein: 6.9 g/dL (ref 6.5–8.1)

## 2019-07-14 LAB — TSH: TSH: 1.85 u[IU]/mL (ref 0.308–3.960)

## 2019-07-14 NOTE — Telephone Encounter (Signed)
Scheduled appt per 8/31 los.  Left a voice message of appt date and time.

## 2019-07-15 ENCOUNTER — Telehealth: Payer: Self-pay | Admitting: *Deleted

## 2019-07-15 NOTE — Telephone Encounter (Signed)
-----   Message from [name], MD sent at 07/14/2019  9:26 PM EDT ----- Please let pt know her thyroid function test was normal, thanks   [name]  07/14/2019

## 2019-07-15 NOTE — Telephone Encounter (Signed)
Spoke with pt and informed her of thyroid function test was normal as per Dr. Ernestina Penna instructions.  Pt voiced understanding.

## 2020-01-07 ENCOUNTER — Other Ambulatory Visit: Payer: Self-pay | Admitting: Hematology

## 2020-01-07 DIAGNOSIS — C50312 Malignant neoplasm of lower-inner quadrant of left female breast: Secondary | ICD-10-CM

## 2020-01-07 DIAGNOSIS — C50212 Malignant neoplasm of upper-inner quadrant of left female breast: Secondary | ICD-10-CM

## 2020-01-07 DIAGNOSIS — Z170 Estrogen receptor positive status [ER+]: Secondary | ICD-10-CM

## 2020-01-07 NOTE — Telephone Encounter (Signed)
Medication refill

## 2020-01-12 ENCOUNTER — Other Ambulatory Visit: Payer: BC Managed Care – PPO

## 2020-01-12 ENCOUNTER — Ambulatory Visit: Payer: BC Managed Care – PPO | Admitting: Hematology

## 2020-01-14 ENCOUNTER — Telehealth: Payer: Self-pay | Admitting: Hematology

## 2020-01-14 NOTE — Telephone Encounter (Signed)
Scheduled appt per 3/3 sch message - - pt aware of appt date and time 

## 2020-01-26 NOTE — Progress Notes (Signed)
[place]   [phone] Fax:([phone]) [phone]   Clinic Follow up Note   Patient Care Team: Patient, No Pcp Per as PCP - General ([place]) [name], MD as Consulting Physician (Hematology) Jovita Kussmaul, MD as Consulting Physician (General Surgery) Eppie Gibson, MD as Attending Physician (Radiation Oncology)  Date of Service:  02/02/2020  CHIEF COMPLAINT: F/u of left breast cancer  SUMMARY OF ONCOLOGIC HISTORY: Oncology History Overview Note  Cancer Staging Malignant neoplasm of upper-inner quadrant of left breast in female, estrogen receptor positive ([place]) Staging form: Breast, AJCC 8th Edition - Clinical stage from 04/13/2017: Stage IIA (cT2, cN0, cM0, G2, ER: Positive, PR: Negative, HER2: Negative) - Signed by [name], MD on 04/25/2017 - Pathologic stage from 06/13/2017: Stage IIB (pT2, pN1a, cM0, G1, ER: Positive, PR: Negative, HER2: Negative) - Signed by [name], MD on 09/04/2017     Malignant neoplasm of upper-inner quadrant of left breast in female, estrogen receptor positive ([place])  04/10/2017 Mammogram   Left breast mammo and US showed an irregular hypoechoic shadowing mass at 9 o'clock 1 cm from the nipple measuring 2.3 x 1 x 2.4 cm. This corresponds well with the mass seen at mammography. No lymphadenopathy seen in the left axilla.   04/18/2017 Initial Diagnosis   Malignant neoplasm of upper-inner quadrant of left breast in female, estrogen receptor positive ([place])   04/22/2017 Receptors her2   Estrogen Receptor: 95%, POSITIVE, STRONG STAINING INTENSITY Progesterone Receptor: 0%, NEGATIVE Proliferation Marker Ki67: 10% HER2 -   04/22/2017 Initial Biopsy   Breast, left, needle core biopsy, 9:30 o'clock - INVASIVE DUCTAL CARCINOMA, G2 - DUCTAL CARCINOMA IN SITU.   06/13/2017 Surgery   Left breast mastectomy with sentinel lymph node biopsy performed by Dr Marlou Starks.    06/13/2017 Pathology Results   Breast, simple mastectomy, Left -  INVASIVE DUCTAL CARCINOMA, GRADE I/III, SPANNING 2.5 CM. - DUCTAL CARCINOMA IN SITU, INTERMEDIATE GRADE. - LOBULAR NEOPLASIA (ATYPICAL LOBULAR HYPERPLASIA). - THE SURGICAL RESECTION MARGINS ARE NEGATIVE FOR CARCINOMA.  Lymph node, sentinel, biopsy, Left axillary #1 - METASTATIC CARCINOMA IN 1 OF 3 LYMPH NODE (1/3), WITH EXTRACAPSULAR EXTENSION.   06/13/2017 Pathology Results   Mammaprint with low risk disease.    07/31/2017 - 09/10/2017 Radiation Therapy   The patient began a course of radiotherapy over the left chest wall and axillary area, supervised by Dr Eppie Gibson, MD.    Radiation treatment dates:   07/31/2017 - 09/10/2017  Site/dose:    1) Left Chest Wall / 50 Gy in 25 fractions 2) Left Supraclavicular / 45 Gy in 25 fractions 3) Left Chest Wall Scar Boost / 10 Gy in 5 fractions  Beams/energy:    1) Opposed tangents 3D/ 10 and 6 MV photons 2) Additional fields 3D / 10 MV and 6 MV photons 3) En face electrons / 6 MeV electrons  Narrative: The patient tolerated radiation treatment relatively well. She experienced mild fatigue. She reported pain, burning, and itching to the left breast and swelling under the left arm. On physical exam, she had dry peeling in the left UIQ of the breast and left axilla, as well as diffuse hyperpigmentation and erythema of the left chest wall. She is using the radiaplex cream to the treatment area and neosporin to the peeling areas.   10/13/2017 -  Anti-estrogen oral therapy   Tamoxifen 66m daily   04/11/2018 Mammogram   04/11/2018 Mammogram IMPRESSION: No mammographic evidence of malignancy. A result letter of this screening mammogram will  be mailed directly to the patient.   04/11/2018 Mammogram   Screening Unilateral Right: No evidence of mammographic malignancy.       CURRENT THERAPY:  adjuvant Tamoxifen 67m daily starting 10/13/17  INTERVAL HISTORY:  [name] here for a follow up of left breast cancer. She was last  seen by me 7 months ago. She presents to the clinic with her husband She notes she will still have mid chest tightness which she has had since surgery. She has normal ROM. She denies any big bone pain. She notes she is eating adequately. She notes she has trouble sleeping and wakes up tired. She goes to sleep 1-3AM and wakes up 9-10AM. This is her husband's sleep schedule.   She is tolerating Tamoxifen well with no major issues. She notes she still has left posterior back pain. They are not interested in CFaxonvaccine. She notes her last period before her breast cancer surgery.  She feels she cannot urinate completely and has residual urine left.    REVIEW OF SYSTEMS:   Constitutional: Denies fevers, chills or abnormal weight loss (+) trouble sleeping Eyes: Denies blurriness of vision Ears, nose, mouth, throat, and face: Denies mucositis or sore throat Respiratory: Denies cough, dyspnea or wheezes Cardiovascular: Denies palpitation, chest discomfort or lower extremity swelling Gastrointestinal:  Denies nausea, heartburn or change in bowel habits Skin: Denies abnormal skin rashes MSK: (+) Left posterior back pain, stable Lymphatics: Denies new lymphadenopathy or easy bruising Neurological:Denies numbness, tingling or new weaknesses Behavioral/Psych: Mood is stable, no new changes  Breast; (+) Mid chest tightness (+) normal ROM All other systems were reviewed with the patient and are negative.  MEDICAL HISTORY:  Past Medical History:  Diagnosis Date  . Breast mass    lt breast mass ? doesnt know how long  . Headache   . History of radiation therapy 07/31/17- 09/10/17   LEft chest wass 50 gy in 25 fractions, Left Supraclavicular 45 Gy in 25 fractions, Left Chest wall scar boost 10 Gy in 5 fractions.   . Malignant neoplasm of upper-inner quadrant of left breast in female, estrogen receptor positive ([place] 04/18/2017  . Personal history of radiation therapy   . UTI (urinary tract infection)       SURGICAL HISTORY: Past Surgical History:  Procedure Laterality Date  . APPENDECTOMY    . COLONOSCOPY    . GANGLION CYST EXCISION Right 09/24/2017   Procedure: EXCISION OF RIGHT DORSAL WRIST GANGLION;  Surgeon: TMilly Jakob MD;  [facility] [facility]  Service: Orthopedics;  Laterality: Right;  . LIPOMA EXCISION Left 06/13/2017   Procedure: EXCISION 3CM LIPOMA ON LEFT ARM;  Surgeon: TJovita Kussmaul MD;  [facility]  Service: General;  Laterality: Left;  .Marland KitchenMASTECTOMY Left 2018  . MASTECTOMY W/ SENTINEL NODE BIOPSY Left 06/13/2017   Procedure: MASTECTOMY WITH SENTINEL LYMPH NODE BIOPSY;  Surgeon: TJovita Kussmaul MD;  [facility] [facility]  Service: General;  Laterality: Left;    I have reviewed the social history and family history with the patient and they are unchanged from previous note.  ALLERGIES:  has No Known Allergies.  MEDICATIONS:  Current Outpatient Medications  Medication Sig Dispense Refill  . acetaminophen (TYLENOL) 325 MG tablet Take 2 tablets (650 mg total) every 6 (six) hours by mouth.    . benzocaine-Menthol (DERMOPLAST) 20-0.5 % AERO Apply 1 application topically daily as needed for irritation (itching).    . meloxicam (MOBIC) 15 MG tablet Take 15  mg daily by mouth.    . oxyCODONE (ROXICODONE) 5 MG immediate release tablet Take 1 tablet (5 mg total) every 6 (six) hours as needed by mouth for severe pain. 28 tablet 0  . tamoxifen (NOLVADEX) 20 MG tablet TAKE 1 TABLET(20 MG) BY MOUTH DAILY 90 tablet 1   No current facility-administered medications for this visit.    PHYSICAL EXAMINATION: ECOG PERFORMANCE STATUS: 1 - Symptomatic but completely ambulatory  Vitals:   02/02/20 0819  BP: (!) 116/58  Pulse: 70  Resp: 20  Temp: (!) 97.2 F (36.2 C)  SpO2: 99%   Filed Weights   02/02/20 0819  Weight: 125 lb 8 oz (56.9 kg)    GENERAL:alert, no distress and comfortable SKIN: skin color, texture, turgor are normal, no rashes or significant  lesions EYES: normal, Conjunctiva are pink and non-injected, sclera clear  NECK: supple, thyroid normal size, non-tender, without nodularity LYMPH:  no palpable lymphadenopathy in the cervical, axillary  LUNGS: clear to auscultation and percussion with normal breathing effort HEART: regular rate & rhythm and no murmurs and no lower extremity edema ABDOMEN:abdomen soft, non-tender and normal bowel sounds Musculoskeletal:no cyanosis of digits and no clubbing  NEURO: alert & oriented x 3 with fluent speech, no focal motor/sensory deficits BREAST: S/p left breast mastectomy: Surgical incision healed well. No palpable mass, nodules or adenopathy bilaterally. Breast exam benign.   LABORATORY DATA:  I have reviewed the data as listed CBC Latest Ref Rng & Units 02/02/2020 07/14/2019 12/30/2018  WBC 4.0 - 10.5 K/uL 4.6 4.8 4.4  Hemoglobin 12.0 - 15.0 g/dL 12.2 12.8 12.0  Hematocrit 36.0 - 46.0 % 36.5 39.0 37.1  Platelets 150 - 400 K/uL 210 233 215     CMP Latest Ref Rng & Units 02/02/2020 07/14/2019 12/30/2018  Glucose 70 - 99 mg/dL 110(H) 136(H) 88  BUN 6 - 20 mg/dL 11 9 8   Creatinine 0.44 - 1.00 mg/dL 0.74 0.78 0.76  Sodium 135 - 145 mmol/L 142 142 144  Potassium 3.5 - 5.1 mmol/L 3.9 3.9 4.4  Chloride 98 - 111 mmol/L 109 106 108  CO2 22 - 32 mmol/L 24 24 28   Calcium 8.9 - 10.3 mg/dL 8.8(L) 9.0 8.9  Total Protein 6.5 - 8.1 g/dL 6.6 6.9 6.5  Total Bilirubin 0.3 - 1.2 mg/dL 0.3 0.4 0.5  Alkaline Phos 38 - 126 U/L 45 41 51  AST 15 - 41 U/L 13(L) 15 13(L)  ALT 0 - 44 U/L 10 13 10       RADIOGRAPHIC STUDIES: I have personally reviewed the radiological images as listed and agreed with the findings in the report. No results found.   ASSESSMENT & PLAN:  Kristina Nicholson is a 53 y.o. female with    1. Malignant neoplasm of upper-inner quadrant of left breast in female, [name], stage IIB, ER: Positive, PR: Negative, HER2: Negative, Grade 3 -She was diagnosed in 04/2017. She is s/p left  breast mastectomy and adjuvant radiation. -Her mammaprint showed low risk disease, adjuvant chemotherapy was not recommended. -She started antiestrogen therapy withTamoxifenin 10/2017. She is toleratingwell, plan for 10 year. -She is clinically doing well. She still has chest tightness since surgery. Lab reviewed, her CBC and CMP are within normal limits except BG 110, Ca 8.8. Her physical exam was unremarkable. There is no clinical concern for recurrence. -Continue surveillance. Next mammogram in 03/2020.  -Her last period was before her breast cancer surgery. Her [place] level from today is still pending. If postmenopausal, I recommend her  to switch to aromatase inhibitor options for another 5 years.  Benefit and potential side effects of aromatase inhibitor, especially arthralgia, osteopenia and osteoporosis, were discussed with her in detail, she is interested.  We will get a baseline bone density scan if she is postmenopausal.  -Continue Tamoxifen.  -F/u in 6 months.  Continue breast cancer surveillance.  I discussed to her and her husband what concerning symptoms to look for in case of cancer recurrence.    2. 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 No treatment was given. -stable  3. Smoking cessation  -We previously discussed health complications with smoking  -I strongly suggested her to stop and she has tried to stop smoking in the past  4. Left chest pain and tightness  -She doeshavepost surgery chest pain and axillary stiffness. I .previously discussed this could be nerve damage from her surgery. -She previously declined PT. Has remained stable. I recommend she exercise and stretch her upper body.  5. Fatigue  -She remains tired after sleeping and this has been going on for a year, but not effecting her activity  level.  -TSH normal at 1.850 on 07/13/20  6. Insomnia  -her husband notes she will stop breathing at times when sleep. I suggest she see pulmonologist for sleep study. She is interested. She will talk to her PCP    PLAN:  -She is clinically doing well -Continue Tamoxifen, may change to Letrozole if she is postmenopausal based on today's [facility] result, and I will obtain baseline bone disease care if she is postmenopausal -Mammogram in 03/2020 at [facility]   -Lab and f/u in44month -She will talk to her primary care physician about a pulmonary referral for sleep study   No problem-specific Assessment & Plan notes found for this encounter.   Orders Placed This Encounter  Procedures  . MM Digital Screening Unilat R    Standing Status:   Future    Standing Expiration Date:   02/01/2021    Order Specific Question:   Reason for Exam (SYMPTOM  OR DIAGNOSIS REQUIRED)    Answer:   screening    Order Specific Question:   Is the patient pregnant?    Answer:   No    Order Specific Question:   [facility]    [facility]  All questions were answered.  I also explained everything to patient in her native language Mandarin. the patient knows to call the clinic with any problems, questions or concerns. No barriers to learning was detected. The total time spent in the appointment was 30 minutes.     YTruitt Merle MD 02/02/2020   I, AJoslyn Devon am acting as scribe for YTruitt Merle MD.   I have reviewed the above documentation for accuracy and completeness, and I agree with the above.

## 2020-02-02 ENCOUNTER — Other Ambulatory Visit: Payer: Self-pay

## 2020-02-02 ENCOUNTER — Encounter: Payer: Self-pay | Admitting: Hematology

## 2020-02-02 ENCOUNTER — Inpatient Hospital Stay (HOSPITAL_BASED_OUTPATIENT_CLINIC_OR_DEPARTMENT_OTHER): Payer: BC Managed Care – PPO | Admitting: Hematology

## 2020-02-02 ENCOUNTER — Telehealth: Payer: Self-pay | Admitting: Hematology

## 2020-02-02 ENCOUNTER — Inpatient Hospital Stay: Payer: BC Managed Care – PPO | Attending: Hematology

## 2020-02-02 VITALS — BP 116/58 | HR 70 | Temp 97.2°F | Resp 20 | Ht 63.0 in | Wt 125.5 lb

## 2020-02-02 DIAGNOSIS — C50212 Malignant neoplasm of upper-inner quadrant of left female breast: Secondary | ICD-10-CM

## 2020-02-02 DIAGNOSIS — Z79810 Long term (current) use of selective estrogen receptor modulators (SERMs): Secondary | ICD-10-CM | POA: Insufficient documentation

## 2020-02-02 DIAGNOSIS — Z1231 Encounter for screening mammogram for malignant neoplasm of breast: Secondary | ICD-10-CM | POA: Diagnosis not present

## 2020-02-02 DIAGNOSIS — Z923 Personal history of irradiation: Secondary | ICD-10-CM | POA: Insufficient documentation

## 2020-02-02 DIAGNOSIS — Z170 Estrogen receptor positive status [ER+]: Secondary | ICD-10-CM | POA: Diagnosis not present

## 2020-02-02 DIAGNOSIS — Z9012 Acquired absence of left breast and nipple: Secondary | ICD-10-CM | POA: Insufficient documentation

## 2020-02-02 DIAGNOSIS — F17210 Nicotine dependence, cigarettes, uncomplicated: Secondary | ICD-10-CM | POA: Insufficient documentation

## 2020-02-02 DIAGNOSIS — C50312 Malignant neoplasm of lower-inner quadrant of left female breast: Secondary | ICD-10-CM

## 2020-02-02 DIAGNOSIS — R300 Dysuria: Secondary | ICD-10-CM | POA: Diagnosis not present

## 2020-02-02 DIAGNOSIS — R079 Chest pain, unspecified: Secondary | ICD-10-CM | POA: Diagnosis not present

## 2020-02-02 DIAGNOSIS — G4700 Insomnia, unspecified: Secondary | ICD-10-CM | POA: Diagnosis not present

## 2020-02-02 LAB — CMP (CANCER CENTER ONLY)
ALT: 10 U/L (ref 0–44)
AST: 13 U/L — ABNORMAL LOW (ref 15–41)
Albumin: 3.8 g/dL (ref 3.5–5.0)
Alkaline Phosphatase: 45 U/L (ref 38–126)
Anion gap: 9 (ref 5–15)
BUN: 11 mg/dL (ref 6–20)
CO2: 24 mmol/L (ref 22–32)
Calcium: 8.8 mg/dL — ABNORMAL LOW (ref 8.9–10.3)
Chloride: 109 mmol/L (ref 98–111)
Creatinine: 0.74 mg/dL (ref 0.44–1.00)
GFR, Est AFR Am: >60 mL/min (ref >60)
GFR, Estimated: >60 mL/min (ref >60)
Glucose, Bld: 110 mg/dL — ABNORMAL HIGH (ref 70–99)
Potassium: 3.9 mmol/L (ref 3.5–5.1)
Sodium: 142 mmol/L (ref 135–145)
Total Bilirubin: 0.3 mg/dL (ref 0.3–1.2)
Total Protein: 6.6 g/dL (ref 6.5–8.1)

## 2020-02-02 LAB — CBC WITH DIFFERENTIAL (CANCER CENTER ONLY)
Abs Immature Granulocytes: 0.01 10*3/uL (ref 0.00–0.07)
Basophils Absolute: 0 10*3/uL (ref 0.0–0.1)
Basophils Relative: 0 %
Eosinophils Absolute: 0.1 10*3/uL (ref 0.0–0.5)
Eosinophils Relative: 2 %
HCT: 36.5 % (ref 36.0–46.0)
Hemoglobin: 12.2 g/dL (ref 12.0–15.0)
Immature Granulocytes: 0 %
Lymphocytes Relative: 37 %
Lymphs Abs: 1.7 10*3/uL (ref 0.7–4.0)
MCH: 31 pg (ref 26.0–34.0)
MCHC: 33.4 g/dL (ref 30.0–36.0)
MCV: 92.6 fL (ref 80.0–100.0)
Monocytes Absolute: 0.4 10*3/uL (ref 0.1–1.0)
Monocytes Relative: 9 %
Neutro Abs: 2.3 10*3/uL (ref 1.7–7.7)
Neutrophils Relative %: 52 %
Platelet Count: 210 10*3/uL (ref 150–400)
RBC: 3.94 MIL/uL (ref 3.87–5.11)
RDW: 12.8 % (ref 11.5–15.5)
WBC Count: 4.6 10*3/uL (ref 4.0–10.5)
nRBC: 0 % (ref 0.0–0.2)

## 2020-02-02 NOTE — Telephone Encounter (Signed)
Scheduled per los. Gave avs and calendar  

## 2020-02-03 LAB — FOLLICLE STIMULATING HORMONE: FSH: 12.8 m[IU]/mL

## 2020-02-04 ENCOUNTER — Telehealth: Payer: Self-pay | Admitting: Medical Oncology

## 2020-02-04 NOTE — Telephone Encounter (Signed)
Husband, Jacqulynn Cadet notified.

## 2020-02-04 NOTE — Telephone Encounter (Signed)
-----   Message from [name], MD sent at 02/03/2020 12:09 PM EDT ----- Please let pt's husband know that she is still premenopausal, continue tamoxifen, thanks   [name]  02/03/2020

## 2020-07-07 ENCOUNTER — Other Ambulatory Visit: Payer: Self-pay | Admitting: Hematology

## 2020-07-07 DIAGNOSIS — C50212 Malignant neoplasm of upper-inner quadrant of left female breast: Secondary | ICD-10-CM

## 2020-07-07 DIAGNOSIS — C50312 Malignant neoplasm of lower-inner quadrant of left female breast: Secondary | ICD-10-CM

## 2020-07-30 NOTE — Progress Notes (Signed)
[place]   [phone] Fax:([phone]   Clinic Follow up Note   Patient Care Team: Patient, No Pcp Per as PCP - General ([place]) [name], MD as Consulting Physician (Hematology) Jovita Kussmaul, MD as Consulting Physician (General Surgery) Eppie Gibson, MD as Attending Physician (Radiation Oncology)  Date of Service:  08/02/2020  CHIEF COMPLAINT: F/u of left breast cancer  SUMMARY OF ONCOLOGIC HISTORY: Oncology History Overview Note  Cancer Staging Malignant neoplasm of upper-inner quadrant of left breast in female, estrogen receptor positive ([place]) Staging form: Breast, AJCC 8th Edition - Clinical stage from 04/13/2017: Stage IIA (cT2, cN0, cM0, G2, ER: Positive, PR: Negative, HER2: Negative) - Signed by [name], MD on 04/25/2017 - Pathologic stage from 06/13/2017: Stage IIB (pT2, pN1a, cM0, G1, ER: Positive, PR: Negative, HER2: Negative) - Signed by [name], MD on 09/04/2017     Malignant neoplasm of upper-inner quadrant of left breast in female, estrogen receptor positive ([place])  04/10/2017 Mammogram   Left breast mammo and US showed an irregular hypoechoic shadowing mass at 9 o'clock 1 cm from the nipple measuring 2.3 x 1 x 2.4 cm. This corresponds well with the mass seen at mammography. No lymphadenopathy seen in the left axilla.   04/18/2017 Initial Diagnosis   Malignant neoplasm of upper-inner quadrant of left breast in female, estrogen receptor positive ([place])   04/22/2017 Receptors her2   Estrogen Receptor: 95%, POSITIVE, STRONG STAINING INTENSITY Progesterone Receptor: 0%, NEGATIVE Proliferation Marker Ki67: 10% HER2 -   04/22/2017 Initial Biopsy   Breast, left, needle core biopsy, 9:30 o'clock - INVASIVE DUCTAL CARCINOMA, G2 - DUCTAL CARCINOMA IN SITU.   06/13/2017 Surgery   Left breast mastectomy with sentinel lymph node biopsy performed by Dr Marlou Starks.    06/13/2017 Pathology Results   Breast, simple mastectomy, Left -  INVASIVE DUCTAL CARCINOMA, GRADE I/III, SPANNING 2.5 CM. - DUCTAL CARCINOMA IN SITU, INTERMEDIATE GRADE. - LOBULAR NEOPLASIA (ATYPICAL LOBULAR HYPERPLASIA). - THE SURGICAL RESECTION MARGINS ARE NEGATIVE FOR CARCINOMA.  Lymph node, sentinel, biopsy, Left axillary #1 - METASTATIC CARCINOMA IN 1 OF 3 LYMPH NODE (1/3), WITH EXTRACAPSULAR EXTENSION.   06/13/2017 Pathology Results   Mammaprint with low risk disease.    07/31/2017 - 09/10/2017 Radiation Therapy   The patient began a course of radiotherapy over the left chest wall and axillary area, supervised by Dr Eppie Gibson, MD.    Radiation treatment dates:   07/31/2017 - 09/10/2017  Site/dose:    1) Left Chest Wall / 50 Gy in 25 fractions 2) Left Supraclavicular / 45 Gy in 25 fractions 3) Left Chest Wall Scar Boost / 10 Gy in 5 fractions  Beams/energy:    1) Opposed tangents 3D/ 10 and 6 MV photons 2) Additional fields 3D / 10 MV and 6 MV photons 3) En face electrons / 6 MeV electrons  Narrative: The patient tolerated radiation treatment relatively well. She experienced mild fatigue. She reported pain, burning, and itching to the left breast and swelling under the left arm. On physical exam, she had dry peeling in the left UIQ of the breast and left axilla, as well as diffuse hyperpigmentation and erythema of the left chest wall. She is using the radiaplex cream to the treatment area and neosporin to the peeling areas.   10/13/2017 -  Anti-estrogen oral therapy   Tamoxifen 16m daily   04/11/2018 Mammogram   04/11/2018 Mammogram IMPRESSION: No mammographic evidence of malignancy. A result letter of this screening mammogram will  be mailed directly to the patient.   04/11/2018 Mammogram   Screening Unilateral Right: No evidence of mammographic malignancy.       CURRENT THERAPY:  adjuvant Tamoxifen 88m daily starting 10/13/17  INTERVAL HISTORY:  Kristina YKumiko Fishmanis here for a follow up of left breast cancer. She was last  seen by me 6 months ago. She presents to the clinic with her husband she is doing well overall, still has tightness in the left chest and axilla, no limited range of motion of left shoulder.  She also has intermittent numbness in the left forearm, no impact on her hand function.  She is otherwise doing well, denies new pain, abdominal discomfort, or other new complaints, has good appetite and energy level.  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  Review of system otherwise negative.   MEDICAL HISTORY:  Past Medical History:  Diagnosis Date  . Breast mass    lt breast mass ? doesnt know how long  . Headache   . History of radiation therapy 07/31/17- 09/10/17   LEft chest wass 50 gy in 25 fractions, Left Supraclavicular 45 Gy in 25 fractions, Left Chest wall scar boost 10 Gy in 5 fractions.   . Malignant neoplasm of upper-inner quadrant of left breast in female, [place] receptor positive [place] 04/18/2017  . Personal history of radiation therapy   . UTI (urinary tract infection)     SURGICAL HISTORY: Past Surgical History:  Procedure Laterality Date  . APPENDECTOMY    . COLONOSCOPY    . GANGLION CYST EXCISION Right 09/24/2017   Procedure: EXCISION OF RIGHT DORSAL WRIST GANGLION;  Surgeon: TMilly Jakob MD;  [facility]  Service: Orthopedics;  Laterality: Right;  . LIPOMA EXCISION Left 06/13/2017   Procedure: EXCISION 3CM LIPOMA ON LEFT ARM;  Surgeon: TJovita Kussmaul MD;  [facility] [facility]  Service: General;  Laterality: Left;  .Marland KitchenMASTECTOMY Left 2018  . MASTECTOMY W/ SENTINEL NODE BIOPSY Left 06/13/2017   Procedure: MASTECTOMY WITH SENTINEL LYMPH NODE BIOPSY;  Surgeon: TJovita Kussmaul MD;  [facility] [facility]  Service: General;  Laterality: Left;    I have reviewed the social history and family history with the patient and they are unchanged from previous note.  ALLERGIES:  has No Known Allergies.  MEDICATIONS:  Current Outpatient  Medications  Medication Sig Dispense Refill  . acetaminophen (TYLENOL) 325 MG tablet Take 2 tablets (650 mg total) every 6 (six) hours by mouth.    . benzocaine-Menthol (DERMOPLAST) 20-0.5 % AERO Apply 1 application topically daily as needed for irritation (itching).    . meloxicam (MOBIC) 15 MG tablet Take 15 mg daily by mouth.    . oxyCODONE (ROXICODONE) 5 MG immediate release tablet Take 1 tablet (5 mg total) every 6 (six) hours as needed by mouth for severe pain. 28 tablet 0  . tamoxifen (NOLVADEX) 20 MG tablet TAKE 1 TABLET(20 MG) BY MOUTH DAILY 90 tablet 1   No current facility-administered medications for this visit.    PHYSICAL EXAMINATION: ECOG PERFORMANCE STATUS: 0 - Asymptomatic  Vitals:   08/02/20 0816  BP: (!) 124/41  Pulse: 73  Resp: 18  Temp: 97.8 F (36.6 C)  SpO2: 99%   Filed Weights   08/02/20 0816  Weight: 124 lb 8 oz (56.5 kg)    GENERAL:alert, no distress and comfortable SKIN: skin color, texture, turgor are normal, no rashes or significant lesions EYES: normal, Conjunctiva are  pink and non-injected, sclera clear NECK: supple, thyroid normal size, non-tender, without nodularity LYMPH:  no palpable lymphadenopathy in the cervical, axillary  LUNGS: clear to auscultation and percussion with normal breathing effort HEART: regular rate & rhythm and no murmurs and no lower extremity edema ABDOMEN:abdomen soft, non-tender and normal bowel sounds Musculoskeletal:no cyanosis of digits and no clubbing  NEURO: alert & oriented x 3 with fluent speech, no focal motor/sensory deficits Breasts: Breast inspection showed status post left mastectomy, no palpable nodules on chest wall.  Palpation of the right breast and axilla revealed no obvious mass that I could appreciate.   LABORATORY DATA:  I have reviewed the data as listed CBC Latest Ref Rng & Units 08/02/2020 02/02/2020 07/14/2019  WBC 4.0 - 10.5 K/uL 4.8 4.6 4.8  Hemoglobin 12.0 - 15.0 g/dL 12.0 12.2 12.8    Hematocrit 36 - 46 % 36.6 36.5 39.0  Platelets 150 - 400 K/uL 209 210 233     CMP Latest Ref Rng & Units 08/02/2020 02/02/2020 07/14/2019  Glucose 70 - 99 mg/dL 103(H) 110(H) 136(H)  BUN 6 - 20 mg/dL _0 Creatinine 0.44 - 1.00 mg/dL 0.72 0.74 0.78  Sodium 135 - 145 mmol/L 140 142 142  Potassium 3.5 - 5.1 mmol/L 3.7 3.9 3.9  Chloride 98 - 111 mmol/L 108 109 106  CO2 22 - 32 mmol/L _1 Calcium 8.9 - 10.3 mg/dL 8.9 8.8(L) 9.0  Total Protein 6.5 - 8.1 g/dL 6.7 6.6 6.9  Total Bilirubin 0.3 - 1.2 mg/dL 0.6 0.3 0.4  Alkaline Phos 38 - 126 U/L 41 45 41  AST 15 - 41 U/L 14(L) 13(L) 15  ALT 0 - 44 U/L _2 RADIOGRAPHIC STUDIES: I have personally reviewed the radiological images as listed and agreed with the findings in the report. No results found.   ASSESSMENT & PLAN:  Kristina Nicholson is a 54 y.o. female with    1. Malignant neoplasm of upper-inner quadrant of left breast in female, [name], stage IIB, ER: Positive, PR: Negative, HER2: Negative, Grade 3 -She was diagnosed in 04/2017. She is s/p left breast mastectomy and adjuvant radiation. -Her mammaprint showed low risk disease, adjuvant chemotherapy was not recommended. -She started antiestrogen therapy withTamoxifenin 10/2017. She is toleratingwell, plan for 10 year. -She has not had a menstrual period since 2018.  Her [place] in March 2021 showed that she is still premenopausal, will continue tamoxifen. -She is clinically doing well, mild persistent left chest wall and left tightness, likely related to her surgery. -Exam was unremarkable today.  Lab reviewed, CBC and CMP are within normal limits.  No clinical concern for recurrence. -Continue breast cancer surveillance, we discussed signs of breast cancer recurrence.  She knows to watch for her symptoms and call me if she has concerns. -Continue follow-up every 6 months, change to every year after 5 years.   2. Chronic dysuria and left frank pain  -She  has trouble completely emptying her bladder, which has been chronic for many years -She has been seen by Urologist and per patient she has a heart shaped kidney. No treatment was given. -stable  3. Smoking cessation  -We previously discussed health complications with smoking  -She still smokes 5-6 cigarettes a day, I strongly encouraged her to quit completely.  4. Left chest pain and tightness  -She doeshavepost surgery chest pain and axillary stiffness. I .previously discussed this could be nerve damage from her surgery. -  She previously declined PT. Has remained stable. I recommend she exercise and stretch her upper body.  She has no limited range of motion of left shoulder  5. Fatigue  -She remains tired after sleeping and this has been going on for a year, but not effecting her activity level.  -TSH normal at 1.850 on 07/13/20 -stable     PLAN:  -Continue tamoxifen and cancer surveillance -Lab and follow-up in 6 months    No problem-specific Assessment & Plan notes found for this encounter.   No orders of the defined types were placed in this encounter.  All questions were answered. The patient knows to call the clinic with any problems, questions or concerns. No barriers to learning was detected. The total time spent in the appointment was 30 minutes.     [name], MD 08/02/2020   I, Joslyn Devon, am acting as scribe for [name], MD.   I have reviewed the above documentation for accuracy and completeness, and I agree with the above.

## 2020-08-02 ENCOUNTER — Other Ambulatory Visit: Payer: Self-pay

## 2020-08-02 ENCOUNTER — Inpatient Hospital Stay: Payer: BC Managed Care – PPO | Attending: Hematology | Admitting: Hematology

## 2020-08-02 ENCOUNTER — Telehealth: Payer: Self-pay | Admitting: Hematology

## 2020-08-02 ENCOUNTER — Other Ambulatory Visit: Payer: Self-pay | Admitting: Hematology

## 2020-08-02 ENCOUNTER — Encounter: Payer: Self-pay | Admitting: Hematology

## 2020-08-02 ENCOUNTER — Inpatient Hospital Stay: Payer: BC Managed Care – PPO

## 2020-08-02 VITALS — BP 124/41 | HR 73 | Temp 97.8°F | Resp 18 | Ht 63.0 in | Wt 124.5 lb

## 2020-08-02 DIAGNOSIS — Z79810 Long term (current) use of selective estrogen receptor modulators (SERMs): Secondary | ICD-10-CM | POA: Diagnosis not present

## 2020-08-02 DIAGNOSIS — Z923 Personal history of irradiation: Secondary | ICD-10-CM | POA: Insufficient documentation

## 2020-08-02 DIAGNOSIS — Z170 Estrogen receptor positive status [ER+]: Secondary | ICD-10-CM

## 2020-08-02 DIAGNOSIS — C50212 Malignant neoplasm of upper-inner quadrant of left female breast: Secondary | ICD-10-CM | POA: Insufficient documentation

## 2020-08-02 DIAGNOSIS — Z9012 Acquired absence of left breast and nipple: Secondary | ICD-10-CM | POA: Insufficient documentation

## 2020-08-02 DIAGNOSIS — Z1231 Encounter for screening mammogram for malignant neoplasm of breast: Secondary | ICD-10-CM

## 2020-08-02 DIAGNOSIS — C50312 Malignant neoplasm of lower-inner quadrant of left female breast: Secondary | ICD-10-CM

## 2020-08-02 LAB — CBC WITH DIFFERENTIAL (CANCER CENTER ONLY)
Abs Immature Granulocytes: 0.01 10*3/uL (ref 0.00–0.07)
Basophils Absolute: 0 10*3/uL (ref 0.0–0.1)
Basophils Relative: 0 %
Eosinophils Absolute: 0.1 10*3/uL (ref 0.0–0.5)
Eosinophils Relative: 2 %
HCT: 36.6 % (ref 36.0–46.0)
Hemoglobin: 12 g/dL (ref 12.0–15.0)
Immature Granulocytes: 0 %
Lymphocytes Relative: 44 %
Lymphs Abs: 2.1 10*3/uL (ref 0.7–4.0)
MCH: 30.5 pg (ref 26.0–34.0)
MCHC: 32.8 g/dL (ref 30.0–36.0)
MCV: 93.1 fL (ref 80.0–100.0)
Monocytes Absolute: 0.4 10*3/uL (ref 0.1–1.0)
Monocytes Relative: 9 %
Neutro Abs: 2.1 10*3/uL (ref 1.7–7.7)
Neutrophils Relative %: 45 %
Platelet Count: 209 10*3/uL (ref 150–400)
RBC: 3.93 MIL/uL (ref 3.87–5.11)
RDW: 12.8 % (ref 11.5–15.5)
WBC Count: 4.8 10*3/uL (ref 4.0–10.5)
nRBC: 0 % (ref 0.0–0.2)

## 2020-08-02 LAB — CMP (CANCER CENTER ONLY)
ALT: 13 U/L (ref 0–44)
AST: 14 U/L — ABNORMAL LOW (ref 15–41)
Albumin: 3.8 g/dL (ref 3.5–5.0)
Alkaline Phosphatase: 41 U/L (ref 38–126)
Anion gap: 7 (ref 5–15)
BUN: 9 mg/dL (ref 6–20)
CO2: 25 mmol/L (ref 22–32)
Calcium: 8.9 mg/dL (ref 8.9–10.3)
Chloride: 108 mmol/L (ref 98–111)
Creatinine: 0.72 mg/dL (ref 0.44–1.00)
GFR, Est AFR Am: >60 mL/min (ref >60)
GFR, Estimated: >60 mL/min (ref >60)
Glucose, Bld: 103 mg/dL — ABNORMAL HIGH (ref 70–99)
Potassium: 3.7 mmol/L (ref 3.5–5.1)
Sodium: 140 mmol/L (ref 135–145)
Total Bilirubin: 0.6 mg/dL (ref 0.3–1.2)
Total Protein: 6.7 g/dL (ref 6.5–8.1)

## 2020-08-02 NOTE — Telephone Encounter (Signed)
Scheduled appointments per 9/20 los. Spoke with patient in person who is aware of upcoming appointments. Gave patient calendar print out.

## 2020-08-30 ENCOUNTER — Ambulatory Visit
Admission: RE | Admit: 2020-08-30 | Discharge: 2020-08-30 | Disposition: A | Discharge status: Home / Self Care | Payer: BC Managed Care – PPO | Source: Ambulatory Visit | Attending: Hematology | Admitting: Hematology

## 2020-08-30 ENCOUNTER — Other Ambulatory Visit: Payer: Self-pay

## 2020-08-30 DIAGNOSIS — Z1231 Encounter for screening mammogram for malignant neoplasm of breast: Secondary | ICD-10-CM | POA: Diagnosis not present

## 2020-09-02 ENCOUNTER — Other Ambulatory Visit: Payer: Self-pay | Admitting: Hematology

## 2020-09-02 DIAGNOSIS — R928 Other abnormal and inconclusive findings on diagnostic imaging of breast: Secondary | ICD-10-CM

## 2020-09-13 DIAGNOSIS — M545 Low back pain, unspecified: Secondary | ICD-10-CM | POA: Diagnosis not present

## 2020-09-20 ENCOUNTER — Ambulatory Visit
Admission: RE | Admit: 2020-09-20 | Discharge: 2020-09-20 | Disposition: A | Discharge status: Home / Self Care | Payer: BC Managed Care – PPO | Source: Ambulatory Visit | Attending: Hematology | Admitting: Hematology

## 2020-09-20 ENCOUNTER — Ambulatory Visit: Payer: BC Managed Care – PPO

## 2020-09-20 ENCOUNTER — Other Ambulatory Visit: Payer: Self-pay

## 2020-09-20 DIAGNOSIS — R928 Other abnormal and inconclusive findings on diagnostic imaging of breast: Secondary | ICD-10-CM | POA: Diagnosis not present

## 2020-11-01 DIAGNOSIS — H6980 Other specified disorders of Eustachian tube, unspecified ear: Secondary | ICD-10-CM | POA: Diagnosis not present

## 2021-01-03 ENCOUNTER — Other Ambulatory Visit: Payer: Self-pay | Admitting: Hematology

## 2021-01-03 DIAGNOSIS — C50212 Malignant neoplasm of upper-inner quadrant of left female breast: Secondary | ICD-10-CM

## 2021-01-03 DIAGNOSIS — Z170 Estrogen receptor positive status [ER+]: Secondary | ICD-10-CM

## 2021-01-03 DIAGNOSIS — C50312 Malignant neoplasm of lower-inner quadrant of left female breast: Secondary | ICD-10-CM

## 2021-01-28 NOTE — Progress Notes (Signed)
[place]   Telephone:(336) [phone] Fax:(336) [phone]   Clinic Follow up Note   Patient Care Team: Patient, No Pcp Per as PCP - General ([place]) [name], MD as Consulting Physician (Hematology) Jovita Kussmaul, MD as Consulting Physician (General Surgery) Eppie Gibson, MD as Attending Physician (Radiation Oncology)  Date of Service:  01/31/2021  CHIEF COMPLAINT: F/u of left breast cancer  SUMMARY OF ONCOLOGIC HISTORY: Oncology History Overview Note  Cancer Staging Malignant neoplasm of upper-inner quadrant of left breast in female, estrogen receptor positive ([place]) Staging form: Breast, AJCC 8th Edition - Clinical stage from 04/13/2017: Stage IIA (cT2, cN0, cM0, G2, ER: Positive, PR: Negative, HER2: Negative) - Signed by [name], MD on 04/25/2017 - Pathologic stage from 06/13/2017: Stage IIB (pT2, pN1a, cM0, G1, ER: Positive, PR: Negative, HER2: Negative) - Signed by [name], MD on 09/04/2017     Malignant neoplasm of upper-inner quadrant of left breast in female, estrogen receptor positive ([place])  04/10/2017 Mammogram   Left breast mammo and US showed an irregular hypoechoic shadowing mass at 9 o'clock 1 cm from the nipple measuring 2.3 x 1 x 2.4 cm. This corresponds well with the mass seen at mammography. No lymphadenopathy seen in the left axilla.   04/18/2017 Initial Diagnosis   Malignant neoplasm of upper-inner quadrant of left breast in female, estrogen receptor positive ([place])   04/22/2017 Receptors her2   Estrogen Receptor: 95%, POSITIVE, STRONG STAINING INTENSITY Progesterone Receptor: 0%, NEGATIVE Proliferation Marker Ki67: 10% HER2 -   04/22/2017 Initial Biopsy   Breast, left, needle core biopsy, 9:30 o'clock - INVASIVE DUCTAL CARCINOMA, G2 - DUCTAL CARCINOMA IN SITU.   06/13/2017 Surgery   Left breast mastectomy with sentinel lymph node biopsy performed by Dr Marlou Starks.    06/13/2017 Pathology Results   Breast, simple mastectomy, Left -  INVASIVE DUCTAL CARCINOMA, GRADE I/III, SPANNING 2.5 CM. - DUCTAL CARCINOMA IN SITU, INTERMEDIATE GRADE. - LOBULAR NEOPLASIA (ATYPICAL LOBULAR HYPERPLASIA). - THE SURGICAL RESECTION MARGINS ARE NEGATIVE FOR CARCINOMA.  Lymph node, sentinel, biopsy, Left axillary #1 - METASTATIC CARCINOMA IN 1 OF 3 LYMPH NODE (1/3), WITH EXTRACAPSULAR EXTENSION.   06/13/2017 Pathology Results   Mammaprint with low risk disease.    07/31/2017 - 09/10/2017 Radiation Therapy   The patient began a course of radiotherapy over the left chest wall and axillary area, supervised by Dr Eppie Gibson, MD.    Radiation treatment dates:   07/31/2017 - 09/10/2017  Site/dose:    1) Left Chest Wall / 50 Gy in 25 fractions 2) Left Supraclavicular / 45 Gy in 25 fractions 3) Left Chest Wall Scar Boost / 10 Gy in 5 fractions  Beams/energy:    1) Opposed tangents 3D/ 10 and 6 MV photons 2) Additional fields 3D / 10 MV and 6 MV photons 3) En face electrons / 6 MeV electrons  Narrative: The patient tolerated radiation treatment relatively well. She experienced mild fatigue. She reported pain, burning, and itching to the left breast and swelling under the left arm. On physical exam, she had dry peeling in the left UIQ of the breast and left axilla, as well as diffuse hyperpigmentation and erythema of the left chest wall. She is using the radiaplex cream to the treatment area and neosporin to the peeling areas.   10/13/2017 -  Anti-estrogen oral therapy   Tamoxifen 49m daily   04/11/2018 Mammogram   04/11/2018 Mammogram IMPRESSION: No mammographic evidence of malignancy. A result letter of this screening mammogram will  be mailed directly to the patient.   04/11/2018 Mammogram   Screening Unilateral Right: No evidence of mammographic malignancy.       CURRENT THERAPY:  adjuvant Tamoxifen 31m daily starting 10/13/17  INTERVAL HISTORY:  Kristina Nicholson here for a follow up of left breast cancer. She was last  seen by me 6 months ago. She presents to the clinic with her husband. She notes pain in her right arm which has been going on for a few months. Certain movements exacerbates her pain. I reviewed her medication list with her. She is tolerating Tamoxifen well. She notes she still takes Aleve for her back pain whish is stable. She notes her energy is adequate. She notes her period was slowing down and irregular before her breast surgery. She has not had a period since her breast surgery. She notes she is still smoking cigarettes.     REVIEW OF SYSTEMS:   Constitutional: Denies fevers, chills or abnormal weight loss Eyes: Denies blurriness of vision Ears, nose, mouth, throat, and face: Denies mucositis or sore throat Respiratory: Denies cough, dyspnea or wheezes Cardiovascular: Denies palpitation, chest discomfort or lower extremity swelling Gastrointestinal:  Denies nausea, heartburn or change in bowel habits Skin: Denies abnormal skin rashes MSK: (+) Right elbow pain (+) Back pain stable Lymphatics: Denies new lymphadenopathy or easy bruising Neurological:Denies numbness, tingling or new weaknesses Behavioral/Psych: Mood is stable, no new changes  All other systems were reviewed with the patient and are negative.  MEDICAL HISTORY:  Past Medical History:  Diagnosis Date  . Breast mass    lt breast mass ? doesnt know how long  . Headache   . History of radiation therapy 07/31/17- 09/10/17   LEft chest wass 50 gy in 25 fractions, Left Supraclavicular 45 Gy in 25 fractions, Left Chest wall scar boost 10 Gy in 5 fractions.   . Malignant neoplasm of upper-inner quadrant of left breast in female, estrogen receptor positive ([place] 04/18/2017  . Personal history of radiation therapy   . UTI (urinary tract infection)     SURGICAL HISTORY: Past Surgical History:  Procedure Laterality Date  . APPENDECTOMY    . COLONOSCOPY    . GANGLION CYST EXCISION Right 09/24/2017   Procedure: EXCISION OF RIGHT  DORSAL WRIST GANGLION;  Surgeon: TMilly Jakob MD;  [facility]  Service: Orthopedics;  Laterality: Right;  . LIPOMA EXCISION Left 06/13/2017   Procedure: EXCISION 3CM LIPOMA ON LEFT ARM;  Surgeon: TJovita Kussmaul MD;  [facility]  Service: General;  Laterality: Left;  .Marland KitchenMASTECTOMY Left 2018  . MASTECTOMY W/ SENTINEL NODE BIOPSY Left 06/13/2017   Procedure: MASTECTOMY WITH SENTINEL LYMPH NODE BIOPSY;  Surgeon: TJovita Kussmaul MD;  [place] [facility]  Service: General;  Laterality: Left;    I have reviewed the social history and family history with the patient and they are unchanged from previous note.  ALLERGIES:  has No Known Allergies.  MEDICATIONS:  Current Outpatient Medications  Medication Sig Dispense Refill  . acetaminophen (TYLENOL) 325 MG tablet Take 2 tablets (650 mg total) every 6 (six) hours by mouth.    . tamoxifen (NOLVADEX) 20 MG tablet TAKE 1 TABLET(20 MG) BY MOUTH DAILY 90 tablet 1   No current facility-administered medications for this visit.    PHYSICAL EXAMINATION: ECOG PERFORMANCE STATUS: 1 - Symptomatic but completely ambulatory  Vitals:   01/31/21 0830  BP: 129/64  Pulse: 64  Resp: 18  Temp: 97.9 F (36.6  C)  SpO2: 100%   Filed Weights   01/31/21 0830  Weight: 127 lb 12.8 oz (58 kg)    GENERAL:alert, no distress and comfortable SKIN: skin color, texture, turgor are normal, no rashes or significant lesions EYES: normal, Conjunctiva are pink and non-injected, sclera clear  NECK: supple, thyroid normal size, non-tender, without nodularity LYMPH:  no palpable lymphadenopathy in the cervical, axillary  LUNGS: clear to auscultation and percussion with normal breathing effort HEART: regular rate & rhythm and no murmurs and no lower extremity edema ABDOMEN:abdomen soft, non-tender and normal bowel sounds Musculoskeletal:no cyanosis of digits and no clubbing, right arm slighter bigger than left without edema or tenderness  NEURO:  alert & oriented x 3 with fluent speech, no focal motor/sensory deficits BREAST: S/p left mastectomy: Surgical incision healed well with mild tenderness. No palpable mass, nodules or adenopathy bilaterally. Breast exam benign.   LABORATORY DATA:  I have reviewed the data as listed CBC Latest Ref Rng & Units 01/31/2021 08/02/2020 02/02/2020  WBC 4.0 - 10.5 K/uL 4.7 4.8 4.6  Hemoglobin 12.0 - 15.0 g/dL 12.0 12.0 12.2  Hematocrit 36.0 - 46.0 % 36.3 36.6 36.5  Platelets 150 - 400 K/uL 221 209 210     CMP Latest Ref Rng & Units 01/31/2021 08/02/2020 02/02/2020  Glucose 70 - 99 mg/dL 89 103(H) 110(H)  BUN 6 - 20 mg/dL 12 9 11   Creatinine 0.44 - 1.00 mg/dL 0.74 0.72 0.74  Sodium 135 - 145 mmol/L 140 140 142  Potassium 3.5 - 5.1 mmol/L 4.1 3.7 3.9  Chloride 98 - 111 mmol/L 108 108 109  CO2 22 - 32 mmol/L 28 25 24   Calcium 8.9 - 10.3 mg/dL 8.7(L) 8.9 8.8(L)  Total Protein 6.5 - 8.1 g/dL 6.5 6.7 6.6  Total Bilirubin 0.3 - 1.2 mg/dL 0.4 0.6 0.3  Alkaline Phos 38 - 126 U/L 39 41 45  AST 15 - 41 U/L 15 14(L) 13(L)  ALT 0 - 44 U/L 13 13 10       RADIOGRAPHIC STUDIES: I have personally reviewed the radiological images as listed and agreed with the findings in the report. No results found.   ASSESSMENT & PLAN:  Marshall Fairy Ashlock is a 55 y.o. female with    1. Malignant neoplasm of upper-inner quadrant of left breast in female, [name], stage IIB, ER: Positive, PR: Negative, HER2: Negative, Grade 3 -She was diagnosed in 04/2017. She is s/p left breast mastectomy and adjuvant radiation. -Her mammaprint showed low risk disease, adjuvant chemotherapy was not recommended. -She started antiestrogen therapy withTamoxifenin 10/2017. She is toleratingwell, plan for 10 year. -She has not had a menstrual period since 2018.  Her [place] in March 2021 showed that she is still premenopausal, will repeat on next visit, will continue tamoxifen. -She is clinically doing well. Lab reviewed, her CBC and CMP are  within normal limits except Ca 8.7. Her physical exam and her 09/2020 mammogram were unremarkable. There is no clinical concern for recurrence. -Continue surveillance. Next Mammogram in 09/2021.  -Continue Tamoxifen  -F/u in 6 months    2. Chronic dysuria and left frank pain, secondary to heart shaped kidney   3. Smoking cessation  -We previously discussed health complications with smoking  -She still smokes 5-6 cigarettes a day, I strongly encouraged her to quit completely.  4. Fatigue  -She remains tired after sleeping and this has been going on for a year, but not effecting her activity level.  -TSHnormal at 1.850 on 07/13/20 -Her fatigue has  ironed and energy is adequate.  5. Bone health  -She still has not had period since her breast surgery. I discussed proceeding with baseline DEXA given she is around menopause and on antiestrogen therapy. She is agreeable. Will proceed in 09/2021 with mammogram   6. Right elbow pain  -She notes for the past few months having right elbow pain, mainly in certain positions.  -I discussed this is likely joint pain and can f/u with PCP.    PLAN: -Continue tamoxifen and cancer surveillance -Will order DEXA and mammogram next visit to be done in 09/2021 -Lab and follow-up in 6 months   No problem-specific Assessment & Plan notes found for this encounter.   No orders of the defined types were placed in this encounter.  All questions were answered. The patient knows to call the clinic with any problems, questions or concerns. No barriers to learning was detected. The total time spent in the appointment was 30 minutes.     [name], MD 01/31/2021   I, Joslyn Devon, am acting as scribe for [name], MD.   I have reviewed the above documentation for accuracy and completeness, and I agree with the above.

## 2021-01-31 ENCOUNTER — Encounter: Payer: Self-pay | Admitting: Hematology

## 2021-01-31 ENCOUNTER — Inpatient Hospital Stay (HOSPITAL_BASED_OUTPATIENT_CLINIC_OR_DEPARTMENT_OTHER): Payer: BC Managed Care – PPO | Admitting: Hematology

## 2021-01-31 ENCOUNTER — Inpatient Hospital Stay: Payer: BC Managed Care – PPO | Attending: Hematology

## 2021-01-31 ENCOUNTER — Other Ambulatory Visit: Payer: Self-pay

## 2021-01-31 VITALS — BP 129/64 | HR 64 | Temp 97.9°F | Resp 18 | Ht 63.0 in | Wt 127.8 lb

## 2021-01-31 DIAGNOSIS — M25521 Pain in right elbow: Secondary | ICD-10-CM | POA: Insufficient documentation

## 2021-01-31 DIAGNOSIS — Z923 Personal history of irradiation: Secondary | ICD-10-CM | POA: Diagnosis not present

## 2021-01-31 DIAGNOSIS — Z9012 Acquired absence of left breast and nipple: Secondary | ICD-10-CM | POA: Diagnosis not present

## 2021-01-31 DIAGNOSIS — R300 Dysuria: Secondary | ICD-10-CM | POA: Insufficient documentation

## 2021-01-31 DIAGNOSIS — Z170 Estrogen receptor positive status [ER+]: Secondary | ICD-10-CM

## 2021-01-31 DIAGNOSIS — F17210 Nicotine dependence, cigarettes, uncomplicated: Secondary | ICD-10-CM | POA: Insufficient documentation

## 2021-01-31 DIAGNOSIS — Z79810 Long term (current) use of selective estrogen receptor modulators (SERMs): Secondary | ICD-10-CM | POA: Diagnosis not present

## 2021-01-31 DIAGNOSIS — C50212 Malignant neoplasm of upper-inner quadrant of left female breast: Secondary | ICD-10-CM

## 2021-01-31 DIAGNOSIS — C50312 Malignant neoplasm of lower-inner quadrant of left female breast: Secondary | ICD-10-CM

## 2021-01-31 LAB — CMP (CANCER CENTER ONLY)
ALT: 13 U/L (ref 0–44)
AST: 15 U/L (ref 15–41)
Albumin: 3.8 g/dL (ref 3.5–5.0)
Alkaline Phosphatase: 39 U/L (ref 38–126)
Anion gap: 4 — ABNORMAL LOW (ref 5–15)
BUN: 12 mg/dL (ref 6–20)
CO2: 28 mmol/L (ref 22–32)
Calcium: 8.7 mg/dL — ABNORMAL LOW (ref 8.9–10.3)
Chloride: 108 mmol/L (ref 98–111)
Creatinine: 0.74 mg/dL (ref 0.44–1.00)
GFR, Estimated: >60 mL/min (ref >60)
Glucose, Bld: 89 mg/dL (ref 70–99)
Potassium: 4.1 mmol/L (ref 3.5–5.1)
Sodium: 140 mmol/L (ref 135–145)
Total Bilirubin: 0.4 mg/dL (ref 0.3–1.2)
Total Protein: 6.5 g/dL (ref 6.5–8.1)

## 2021-01-31 LAB — CBC WITH DIFFERENTIAL (CANCER CENTER ONLY)
Abs Immature Granulocytes: 0.03 10*3/uL (ref 0.00–0.07)
Basophils Absolute: 0 10*3/uL (ref 0.0–0.1)
Basophils Relative: 0 %
Eosinophils Absolute: 0.1 10*3/uL (ref 0.0–0.5)
Eosinophils Relative: 3 %
HCT: 36.3 % (ref 36.0–46.0)
Hemoglobin: 12 g/dL (ref 12.0–15.0)
Immature Granulocytes: 1 %
Lymphocytes Relative: 39 %
Lymphs Abs: 1.8 10*3/uL (ref 0.7–4.0)
MCH: 30.4 pg (ref 26.0–34.0)
MCHC: 33.1 g/dL (ref 30.0–36.0)
MCV: 91.9 fL (ref 80.0–100.0)
Monocytes Absolute: 0.5 10*3/uL (ref 0.1–1.0)
Monocytes Relative: 10 %
Neutro Abs: 2.2 10*3/uL (ref 1.7–7.7)
Neutrophils Relative %: 47 %
Platelet Count: 221 10*3/uL (ref 150–400)
RBC: 3.95 MIL/uL (ref 3.87–5.11)
RDW: 12.8 % (ref 11.5–15.5)
WBC Count: 4.7 10*3/uL (ref 4.0–10.5)
nRBC: 0 % (ref 0.0–0.2)

## 2021-07-03 ENCOUNTER — Other Ambulatory Visit: Payer: Self-pay | Admitting: Hematology

## 2021-07-03 DIAGNOSIS — Z170 Estrogen receptor positive status [ER+]: Secondary | ICD-10-CM

## 2021-07-03 DIAGNOSIS — C50212 Malignant neoplasm of upper-inner quadrant of left female breast: Secondary | ICD-10-CM

## 2021-07-03 DIAGNOSIS — C50312 Malignant neoplasm of lower-inner quadrant of left female breast: Secondary | ICD-10-CM

## 2021-08-01 ENCOUNTER — Encounter: Payer: Self-pay | Admitting: Hematology

## 2021-08-01 ENCOUNTER — Other Ambulatory Visit: Payer: Self-pay

## 2021-08-01 ENCOUNTER — Inpatient Hospital Stay: Payer: BC Managed Care – PPO

## 2021-08-01 ENCOUNTER — Inpatient Hospital Stay: Payer: BC Managed Care – PPO | Attending: Hematology | Admitting: Hematology

## 2021-08-01 VITALS — BP 130/68 | HR 60 | Temp 98.0°F | Resp 18 | Wt 127.6 lb

## 2021-08-01 DIAGNOSIS — Z923 Personal history of irradiation: Secondary | ICD-10-CM | POA: Insufficient documentation

## 2021-08-01 DIAGNOSIS — C50212 Malignant neoplasm of upper-inner quadrant of left female breast: Secondary | ICD-10-CM | POA: Insufficient documentation

## 2021-08-01 DIAGNOSIS — M25521 Pain in right elbow: Secondary | ICD-10-CM | POA: Insufficient documentation

## 2021-08-01 DIAGNOSIS — Z79810 Long term (current) use of selective estrogen receptor modulators (SERMs): Secondary | ICD-10-CM | POA: Diagnosis not present

## 2021-08-01 DIAGNOSIS — Z9012 Acquired absence of left breast and nipple: Secondary | ICD-10-CM | POA: Insufficient documentation

## 2021-08-01 DIAGNOSIS — R300 Dysuria: Secondary | ICD-10-CM | POA: Insufficient documentation

## 2021-08-01 DIAGNOSIS — Z1231 Encounter for screening mammogram for malignant neoplasm of breast: Secondary | ICD-10-CM

## 2021-08-01 DIAGNOSIS — R109 Unspecified abdominal pain: Secondary | ICD-10-CM | POA: Diagnosis not present

## 2021-08-01 DIAGNOSIS — Z170 Estrogen receptor positive status [ER+]: Secondary | ICD-10-CM | POA: Diagnosis not present

## 2021-08-01 DIAGNOSIS — F17210 Nicotine dependence, cigarettes, uncomplicated: Secondary | ICD-10-CM | POA: Insufficient documentation

## 2021-08-01 DIAGNOSIS — Z79899 Other long term (current) drug therapy: Secondary | ICD-10-CM | POA: Diagnosis not present

## 2021-08-01 DIAGNOSIS — R5383 Other fatigue: Secondary | ICD-10-CM | POA: Diagnosis not present

## 2021-08-01 DIAGNOSIS — C50312 Malignant neoplasm of lower-inner quadrant of left female breast: Secondary | ICD-10-CM

## 2021-08-01 LAB — CMP (CANCER CENTER ONLY)
ALT: 10 U/L (ref 0–44)
AST: 13 U/L — ABNORMAL LOW (ref 15–41)
Albumin: 3.7 g/dL (ref 3.5–5.0)
Alkaline Phosphatase: 41 U/L (ref 38–126)
Anion gap: 8 (ref 5–15)
BUN: 9 mg/dL (ref 6–20)
CO2: 27 mmol/L (ref 22–32)
Calcium: 9 mg/dL (ref 8.9–10.3)
Chloride: 108 mmol/L (ref 98–111)
Creatinine: 0.71 mg/dL (ref 0.44–1.00)
GFR, Estimated: >60 mL/min (ref >60)
Glucose, Bld: 89 mg/dL (ref 70–99)
Potassium: 3.7 mmol/L (ref 3.5–5.1)
Sodium: 143 mmol/L (ref 135–145)
Total Bilirubin: 0.5 mg/dL (ref 0.3–1.2)
Total Protein: 6.4 g/dL — ABNORMAL LOW (ref 6.5–8.1)

## 2021-08-01 LAB — CBC WITH DIFFERENTIAL (CANCER CENTER ONLY)
Abs Immature Granulocytes: 0 10*3/uL (ref 0.00–0.07)
Basophils Absolute: 0 10*3/uL (ref 0.0–0.1)
Basophils Relative: 0 %
Eosinophils Absolute: 0.1 10*3/uL (ref 0.0–0.5)
Eosinophils Relative: 2 %
HCT: 34.7 % — ABNORMAL LOW (ref 36.0–46.0)
Hemoglobin: 11.5 g/dL — ABNORMAL LOW (ref 12.0–15.0)
Immature Granulocytes: 0 %
Lymphocytes Relative: 41 %
Lymphs Abs: 1.9 10*3/uL (ref 0.7–4.0)
MCH: 30.5 pg (ref 26.0–34.0)
MCHC: 33.1 g/dL (ref 30.0–36.0)
MCV: 92 fL (ref 80.0–100.0)
Monocytes Absolute: 0.6 10*3/uL (ref 0.1–1.0)
Monocytes Relative: 12 %
Neutro Abs: 2.1 10*3/uL (ref 1.7–7.7)
Neutrophils Relative %: 45 %
Platelet Count: 207 10*3/uL (ref 150–400)
RBC: 3.77 MIL/uL — ABNORMAL LOW (ref 3.87–5.11)
RDW: 12.6 % (ref 11.5–15.5)
WBC Count: 4.6 10*3/uL (ref 4.0–10.5)
nRBC: 0 % (ref 0.0–0.2)

## 2021-08-01 NOTE — Progress Notes (Signed)
Kristina Nicholson   Telephone:(336) [phone] Fax:([phone]) [phone]   Clinic Follow up Note   Patient Care Team: Patient, No Pcp Per (Inactive) as PCP - General ([place]) [name], MD as Consulting Physician (Hematology) Jovita Kussmaul, MD as Consulting Physician (General Surgery) Eppie Gibson, MD as Attending Physician (Radiation Oncology)  Date of Service:  08/01/2021  CHIEF COMPLAINT: f/u of left breast cancer  CURRENT THERAPY:  adjuvant Tamoxifen 78m daily starting 10/13/17  ASSESSMENT & PLAN:  Kristina Nicholson a 55y.o. female with   1. Malignant neoplasm of upper-inner quadrant of left breast in female, [name], stage IIB, ER: Positive, PR: Negative, HER2: Negative, Grade 3 -She was diagnosed in 04/2017. She is s/p left breast mastectomy and adjuvant radiation.  -Her mammaprint showed low risk disease, adjuvant chemotherapy was not recommended. -She started antiestrogen therapy with Tamoxifen in 10/2017. She is tolerating well, plan for 10 year. -She has not had a menstrual period since 2018.  Her FAmblerin March 2021 showed that she is still premenopausal, will continue tamoxifen. -most recent mammogram in 09/2020 was negative. -She is clinically doing well. Lab reviewed, CBC showed mildly low RBC, Hgb, and HCT. I recommend she take a pre-natal vitamin. CMP is pending. Her physical exam was unremarkable. There is no clinical concern for recurrence. -Continue surveillance. Next Mammogram in 09/2021.  -Continue Tamoxifen  -F/u in 6 months    2. Chronic dysuria and left flank pain, secondary to heart shaped kidney    3. Smoking cessation   -We previously discussed health complications with smoking  -She still smokes 5-6 cigarettes a day, I strongly encouraged her to quit completely.   4. Fatigue  -She remains tired after sleeping and this has been going on for 1.5 years. -she notes this has recently been affecting her energy level. I recommend she try  melatonin. -TSH normal at 1.850 on 07/13/20   5. Bone health  -She still has not had period since her breast surgery. But FCorningin 2021 showed she is still premenopausal -We will get a baseline DEXA when she is 58   6. Right elbow pain  -She notes for the past few months having right elbow pain, mainly in certain positions.  -I discussed this is likely joint pain and can f/u with PCP.      PLAN:  -Continue tamoxifen and cancer surveillance -I ordered mammogram to be done in 09/2021 -Lab and follow-up in 6 months   No problem-specific Assessment & Plan notes found for this encounter.   SUMMARY OF ONCOLOGIC HISTORY: Oncology History Overview Note  Cancer Staging Malignant neoplasm of upper-inner quadrant of left breast in female, estrogen receptor positive ([place] Staging form: Breast, AJCC 8th Edition - Clinical stage from 04/13/2017: Stage IIA (cT2, cN0, cM0, G2, ER: Positive, PR: Negative, HER2: Negative) - Signed by FTruitt Merle MD on 04/25/2017 - Pathologic stage from 06/13/2017: Stage IIB (pT2, pN1a, cM0, G1, ER: Positive, PR: Negative, HER2: Negative) - Signed by FTruitt Merle MD on 09/04/2017     Malignant neoplasm of upper-inner quadrant of left breast in female, estrogen receptor positive ([place]  04/10/2017 Mammogram   Left breast mammo and [place]owed an irregular hypoechoic shadowing mass at 9 o'clock 1 cm from the nipple measuring 2.3 x 1 x 2.4 cm. This corresponds well with the mass seen at mammography. No lymphadenopathy seen in the left axilla.   04/18/2017 Initial Diagnosis   Malignant neoplasm of upper-inner quadrant of left breast in  female, estrogen receptor positive ([place])   04/22/2017 Receptors her2   Estrogen Receptor: 95%, POSITIVE, STRONG STAINING INTENSITY Progesterone Receptor: 0%, NEGATIVE Proliferation Marker Ki67: 10% HER2 -   04/22/2017 Initial Biopsy   Breast, left, needle core biopsy, 9:30 o'clock - INVASIVE DUCTAL CARCINOMA, G2 - DUCTAL CARCINOMA IN SITU.    06/13/2017 Surgery   Left breast mastectomy with sentinel lymph node biopsy performed by Dr Marlou Starks.    06/13/2017 Pathology Results   Breast, simple mastectomy, Left - INVASIVE DUCTAL CARCINOMA, GRADE I/III, SPANNING 2.5 CM. - DUCTAL CARCINOMA IN SITU, INTERMEDIATE GRADE. - LOBULAR NEOPLASIA (ATYPICAL LOBULAR HYPERPLASIA). - THE SURGICAL RESECTION MARGINS ARE NEGATIVE FOR CARCINOMA.  Lymph node, sentinel, biopsy, Left axillary #1 - METASTATIC CARCINOMA IN 1 OF 3 LYMPH NODE (1/3), WITH EXTRACAPSULAR EXTENSION.   06/13/2017 Pathology Results   Mammaprint with low risk disease.    07/31/2017 - 09/10/2017 Radiation Therapy   The patient began a course of radiotherapy over the left chest wall and axillary area, supervised by Dr Eppie Gibson, MD.     Radiation treatment dates:   07/31/2017 - 09/10/2017   Site/dose:    1) Left Chest Wall / 50 Gy in 25 fractions 2) Left Supraclavicular / 45 Gy in 25 fractions 3) Left Chest Wall Scar Boost / 10 Gy in 5 fractions   Beams/energy:    1) Opposed tangents 3D/ 10 and 6 MV photons 2) Additional fields 3D / 10 MV and 6 MV photons 3) En face electrons / 6 MeV electrons   Narrative: The patient tolerated radiation treatment relatively well. She experienced mild fatigue. She reported pain, burning, and itching to the left breast and swelling under the left arm. On physical exam, she had dry peeling in the left UIQ of the breast and left axilla, as well as diffuse hyperpigmentation and erythema of the left chest wall. She is using the radiaplex cream to the treatment area and neosporin to the peeling areas.   10/13/2017 -  Anti-estrogen oral therapy   Tamoxifen 84m daily   04/11/2018 Mammogram   04/11/2018 Mammogram IMPRESSION: No mammographic evidence of malignancy. A result letter of this screening mammogram will be mailed directly to the patient.   04/11/2018 Mammogram   Screening Unilateral Right: No evidence of mammographic malignancy.        INTERVAL HISTORY:  Kristina YMaybelline Kolarikis here for a follow up of breast cancer. She was last seen by me on 01/31/21. She presents to the clinic accompanied by her husband. She reports she is doing okay overall. She reports some back pain, for which she takes ibuprofen or aleve. She notes it is chronic pain to the left lower back. She notes she does not always sleep well. Her husband notes she does not have a PCP. She notes she does have a GYN but has not been in several years. Her husband notes it has been a rough year for him, as he lost his father and an aunt.   All other systems were reviewed with the patient and are negative.  MEDICAL HISTORY:  Past Medical History:  Diagnosis Date   Breast mass    lt breast mass ? doesnt know how long   Headache    History of radiation therapy 07/31/17- 09/10/17   LEft chest wass 50 gy in 25 fractions, Left Supraclavicular 45 Gy in 25 fractions, Left Chest wall scar boost 10 Gy in 5 fractions.    Malignant neoplasm of upper-inner quadrant of left breast in  female, estrogen receptor positive ([place]) 04/18/2017   Personal history of radiation therapy    UTI (urinary tract infection)     SURGICAL HISTORY: Past Surgical History:  Procedure Laterality Date   APPENDECTOMY     COLONOSCOPY     GANGLION CYST EXCISION Right 09/24/2017   Procedure: EXCISION OF RIGHT DORSAL WRIST GANGLION;  Surgeon: Milly Jakob, MD;  Location: [place];  Service: Orthopedics;  Laterality: Right;   LIPOMA EXCISION Left 06/13/2017   Procedure: EXCISION 3CM LIPOMA ON LEFT ARM;  Surgeon: Jovita Kussmaul, MD;  Location: [place];  Service: General;  Laterality: Left;   MASTECTOMY Left 2018   MASTECTOMY W/ SENTINEL NODE BIOPSY Left 06/13/2017   Procedure: MASTECTOMY WITH SENTINEL LYMPH NODE BIOPSY;  Surgeon: Jovita Kussmaul, MD;  Location: [place];  Service: General;  Laterality: Left;    I have reviewed the social history and family history with the patient and  they are unchanged from previous note.  ALLERGIES:  has No Known Allergies.  MEDICATIONS:  Current Outpatient Medications  Medication Sig Dispense Refill   acetaminophen (TYLENOL) 325 MG tablet Take 2 tablets (650 mg total) every 6 (six) hours by mouth.     tamoxifen (NOLVADEX) 20 MG tablet TAKE 1 TABLET(20 MG) BY MOUTH DAILY 90 tablet 1   No current facility-administered medications for this visit.    PHYSICAL EXAMINATION: ECOG PERFORMANCE STATUS: 0 - Asymptomatic  Vitals:   08/01/21 0820  BP: 130/68  Pulse: 60  Resp: 18  Temp: 98 F (36.7 C)  SpO2: 98%   Wt Readings from Last 3 Encounters:  08/01/21 127 lb 9 oz (57.9 kg)  01/31/21 127 lb 12.8 oz (58 kg)  08/02/20 124 lb 8 oz (56.5 kg)     GENERAL:alert, no distress and comfortable SKIN: skin color, texture, turgor are normal, no rashes or significant lesions EYES: normal, Conjunctiva are pink and non-injected, sclera clear  NECK: supple, thyroid normal size, non-tender, without nodularity LYMPH:  no palpable lymphadenopathy in the cervical, axillary  LUNGS: clear to auscultation and percussion with normal breathing effort HEART: regular rate & rhythm and no murmurs and no lower extremity edema ABDOMEN:abdomen soft, non-tender and normal bowel sounds Musculoskeletal:no cyanosis of digits and no clubbing  NEURO: alert & oriented x 3 with fluent speech, no focal motor/sensory deficits BREAST:  No palpable mass, nodules or adenopathy bilaterally. Breast exam benign.   LABORATORY DATA:  I have reviewed the data as listed CBC Latest Ref Rng & Units 08/01/2021 01/31/2021 08/02/2020  WBC 4.0 - 10.5 K/uL 4.6 4.7 4.8  Hemoglobin 12.0 - 15.0 g/dL 11.5(L) 12.0 12.0  Hematocrit 36.0 - 46.0 % 34.7(L) 36.3 36.6  Platelets 150 - 400 K/uL 207 221 209     CMP Latest Ref Rng & Units 08/01/2021 01/31/2021 08/02/2020  Glucose 70 - 99 mg/dL 89 89 103(H)  BUN 6 - 20 mg/dL _0 Creatinine 0.44 - 1.00 mg/dL 0.71 0.74 0.72  Sodium 135 -  145 mmol/L 143 140 140  Potassium 3.5 - 5.1 mmol/L 3.7 4.1 3.7  Chloride 98 - 111 mmol/L 108 108 108  CO2 22 - 32 mmol/L _1 Calcium 8.9 - 10.3 mg/dL 9.0 8.7(L) 8.9  Total Protein 6.5 - 8.1 g/dL 6.4(L) 6.5 6.7  Total Bilirubin 0.3 - 1.2 mg/dL 0.5 0.4 0.6  Alkaline Phos 38 - 126 U/L 41 39 41  AST 15 - 41 U/L 13(L) 15 14(L)  ALT 0 - 44  U/L _0 RADIOGRAPHIC STUDIES: I have personally reviewed the radiological images as listed and agreed with the findings in the report. No results found.    Orders Placed This Encounter  Procedures   MM Digital Screening Unilat R    Standing Status:   Future    Standing Expiration Date:   08/01/2022    Order Specific Question:   Reason for Exam (SYMPTOM  OR DIAGNOSIS REQUIRED)    Answer:   screening    Order Specific Question:   Is the patient pregnant?    Answer:   No    Order Specific Question:   Preferred imaging location?    Answer:   [facility]   All questions were answered. The patient knows to call the clinic with any problems, questions or concerns. No barriers to learning was detected. The total time spent in the appointment was 30 minutes.     [name], MD 08/01/2021   I, Wilburn Mylar, am acting as scribe for [name], MD.   I have reviewed the above documentation for accuracy and completeness, and I agree with the above.

## 2021-09-26 ENCOUNTER — Other Ambulatory Visit: Payer: Self-pay

## 2021-09-26 ENCOUNTER — Ambulatory Visit
Admission: RE | Admit: 2021-09-26 | Discharge: 2021-09-26 | Disposition: A | Discharge status: Home / Self Care | Payer: BC Managed Care – PPO | Source: Ambulatory Visit | Attending: Hematology | Admitting: Hematology

## 2021-09-26 DIAGNOSIS — Z1231 Encounter for screening mammogram for malignant neoplasm of breast: Secondary | ICD-10-CM | POA: Diagnosis not present

## 2021-12-31 ENCOUNTER — Other Ambulatory Visit: Payer: Self-pay | Admitting: Hematology

## 2021-12-31 DIAGNOSIS — Z170 Estrogen receptor positive status [ER+]: Secondary | ICD-10-CM

## 2021-12-31 DIAGNOSIS — C50312 Malignant neoplasm of lower-inner quadrant of left female breast: Secondary | ICD-10-CM

## 2022-01-30 ENCOUNTER — Other Ambulatory Visit: Payer: Self-pay

## 2022-01-30 ENCOUNTER — Encounter: Payer: Self-pay | Admitting: Hematology

## 2022-01-30 ENCOUNTER — Inpatient Hospital Stay (HOSPITAL_BASED_OUTPATIENT_CLINIC_OR_DEPARTMENT_OTHER): Payer: BC Managed Care – PPO | Admitting: Hematology

## 2022-01-30 ENCOUNTER — Inpatient Hospital Stay: Payer: BC Managed Care – PPO | Attending: Hematology

## 2022-01-30 VITALS — BP 136/83 | HR 60 | Temp 98.5°F | Resp 17 | Ht 63.0 in | Wt 130.4 lb

## 2022-01-30 DIAGNOSIS — C50212 Malignant neoplasm of upper-inner quadrant of left female breast: Secondary | ICD-10-CM

## 2022-01-30 DIAGNOSIS — F17210 Nicotine dependence, cigarettes, uncomplicated: Secondary | ICD-10-CM | POA: Diagnosis not present

## 2022-01-30 DIAGNOSIS — E2839 Other primary ovarian failure: Secondary | ICD-10-CM

## 2022-01-30 DIAGNOSIS — Z79810 Long term (current) use of selective estrogen receptor modulators (SERMs): Secondary | ICD-10-CM | POA: Diagnosis not present

## 2022-01-30 DIAGNOSIS — Z9012 Acquired absence of left breast and nipple: Secondary | ICD-10-CM | POA: Diagnosis not present

## 2022-01-30 DIAGNOSIS — Z923 Personal history of irradiation: Secondary | ICD-10-CM | POA: Insufficient documentation

## 2022-01-30 DIAGNOSIS — Z1231 Encounter for screening mammogram for malignant neoplasm of breast: Secondary | ICD-10-CM | POA: Diagnosis not present

## 2022-01-30 DIAGNOSIS — Z170 Estrogen receptor positive status [ER+]: Secondary | ICD-10-CM | POA: Diagnosis not present

## 2022-01-30 DIAGNOSIS — Z79899 Other long term (current) drug therapy: Secondary | ICD-10-CM | POA: Insufficient documentation

## 2022-01-30 DIAGNOSIS — C50312 Malignant neoplasm of lower-inner quadrant of left female breast: Secondary | ICD-10-CM

## 2022-01-30 LAB — CMP (CANCER CENTER ONLY)
ALT: 10 U/L (ref 0–44)
AST: 13 U/L — ABNORMAL LOW (ref 15–41)
Albumin: 4.1 g/dL (ref 3.5–5.0)
Alkaline Phosphatase: 40 U/L (ref 38–126)
Anion gap: 7 (ref 5–15)
BUN: 12 mg/dL (ref 6–20)
CO2: 25 mmol/L (ref 22–32)
Calcium: 9 mg/dL (ref 8.9–10.3)
Chloride: 108 mmol/L (ref 98–111)
Creatinine: 0.62 mg/dL (ref 0.44–1.00)
GFR, Estimated: >60 mL/min (ref >60)
Glucose, Bld: 101 mg/dL — ABNORMAL HIGH (ref 70–99)
Potassium: 3.9 mmol/L (ref 3.5–5.1)
Sodium: 140 mmol/L (ref 135–145)
Total Bilirubin: 0.4 mg/dL (ref 0.3–1.2)
Total Protein: 6.8 g/dL (ref 6.5–8.1)

## 2022-01-30 LAB — CBC WITH DIFFERENTIAL (CANCER CENTER ONLY)
Abs Immature Granulocytes: 0.01 10*3/uL (ref 0.00–0.07)
Basophils Absolute: 0 10*3/uL (ref 0.0–0.1)
Basophils Relative: 1 %
Eosinophils Absolute: 0.1 10*3/uL (ref 0.0–0.5)
Eosinophils Relative: 2 %
HCT: 35.2 % — ABNORMAL LOW (ref 36.0–46.0)
Hemoglobin: 12.1 g/dL (ref 12.0–15.0)
Immature Granulocytes: 0 %
Lymphocytes Relative: 38 %
Lymphs Abs: 1.9 10*3/uL (ref 0.7–4.0)
MCH: 31.1 pg (ref 26.0–34.0)
MCHC: 34.4 g/dL (ref 30.0–36.0)
MCV: 90.5 fL (ref 80.0–100.0)
Monocytes Absolute: 0.5 10*3/uL (ref 0.1–1.0)
Monocytes Relative: 11 %
Neutro Abs: 2.4 10*3/uL (ref 1.7–7.7)
Neutrophils Relative %: 48 %
Platelet Count: 229 10*3/uL (ref 150–400)
RBC: 3.89 MIL/uL (ref 3.87–5.11)
RDW: 12.9 % (ref 11.5–15.5)
WBC Count: 5 10*3/uL (ref 4.0–10.5)
nRBC: 0 % (ref 0.0–0.2)

## 2022-01-30 NOTE — Progress Notes (Signed)
?[place]   ?Telephone:(336) [phone] [phone]   ?Clinic Follow up Note  ? ?Patient Care Team: ?Patient, No Pcp Per (Inactive) as PCP - General (General Practice) ?[name], MD as Consulting Physician (Hematology) ?Jovita Kussmaul, MD as Consulting Physician (General Surgery) ?Eppie Gibson, MD as Attending Physician (Radiation Oncology) ? ?Date of Service:  01/30/2022 ? ?CHIEF COMPLAINT: f/u of left breast cancer ? ?CURRENT THERAPY:  ?Tamoxifen 23m daily starting 10/13/17 ? ?ASSESSMENT & PLAN:  ?Kristina YKailia Starryis a 56y.o. perimenopausal female with  ? ?1. Malignant neoplasm of upper-inner quadrant of left breast in female, [name], stage IIB, ER: Positive, PR: Negative, HER2: Negative, Grade 3 ?-She was diagnosed in 04/2017. She is s/p left breast mastectomy and adjuvant radiation. Mammaprint showed low risk. ?-She started antiestrogen therapy with Tamoxifen in 10/2017. She is tolerating well, plan for 10 year. She has not had a menstrual period since 2018.  Her F[place]in March 2021 showed that she is still premenopausal, will continue tamoxifen. Will check FGlenaireon next visit  ?-most recent mammogram on 09/26/21 was negative. ?-She is clinically doing well. Lab reviewed, overall WNL. Her physical exam was unremarkable. There is no clinical concern for recurrence. ?-Continue surveillance. Next Mammogram in 09/2022.  ?-Continue Tamoxifen  ?-F/u in 6 months  ?  ?2. Chronic dysuria and left flank pain, secondary to heart shaped kidney  ?  ?3. Smoking cessation   ?-We previously discussed health complications with smoking  ?  ?4. Bone health  ?-She still has not had period since her breast surgery. But FHewittin 2021 showed she is still premenopausal ?-We will get a baseline DEXA; I ordered today to be done with mammogram. ?  ?5. Right elbow pain  ?-She notes for the past few months having right elbow pain, mainly in certain positions.  ?-I discussed this is likely joint pain and can f/u with  PCP.  ?  ?  ?PLAN:  ?-Continue tamoxifen and cancer surveillance ?-I ordered mammogram and DEXA to be done in 09/2021 ?-Lab and follow-up in 9 months ? ? ?No problem-specific Assessment & Plan notes found for this encounter. ? ? ?SUMMARY OF ONCOLOGIC HISTORY: ?Oncology History Overview Note  ?Cancer Staging ?Malignant neoplasm of upper-inner quadrant of left breast in female, estrogen receptor positive ([place] ?Staging form: Breast, AJCC 8th Edition ?- Clinical stage from 04/13/2017: Stage IIA (cT2, cN0, cM0, G2, ER: Positive, PR: Negative, HER2: Negative) - Signed by FTruitt Merle MD on 04/25/2017 ?- Pathologic stage from 06/13/2017: Stage IIB (pT2, pN1a, cM0, G1, ER: Positive, PR: Negative, HER2: Negative) - Signed by FTruitt Merle MD on 09/04/2017 ? ? ?  ?Malignant neoplasm of upper-inner quadrant of left breast in female, estrogen receptor positive ([place]  ?04/10/2017 Mammogram  ? Left breast mammo and UKoreashowed an irregular hypoechoic shadowing mass at 9 o'clock 1 cm from the nipple measuring 2.3 x 1 x 2.4 cm. This corresponds well with the mass seen at mammography. No lymphadenopathy seen in the left axilla. ?  ?04/18/2017 Initial Diagnosis  ? Malignant neoplasm of upper-inner quadrant of left breast in female, estrogen receptor positive ([place] ?  ?04/22/2017 Receptors her2  ? Estrogen Receptor: 95%, POSITIVE, STRONG STAINING INTENSITY ?Progesterone Receptor: 0%, NEGATIVE ?Proliferation Marker Ki67: 10% ?HER2 - ?  ?04/22/2017 Initial Biopsy  ? Breast, left, needle core biopsy, 9:30 o'clock ?- INVASIVE DUCTAL CARCINOMA, G2 ?- DUCTAL CARCINOMA IN SITU. ?  ?06/13/2017 Surgery  ? Left breast mastectomy with sentinel lymph  node biopsy performed by Dr Marlou Starks.  ?  ?06/13/2017 Pathology Results  ? Breast, simple mastectomy, Left ?- INVASIVE DUCTAL CARCINOMA, GRADE I/III, SPANNING 2.5 CM. ?- DUCTAL CARCINOMA IN SITU, INTERMEDIATE GRADE. ?- LOBULAR NEOPLASIA (ATYPICAL LOBULAR HYPERPLASIA). ?- THE SURGICAL RESECTION MARGINS ARE NEGATIVE FOR  CARCINOMA. ? ?Lymph node, sentinel, biopsy, Left axillary #1 ?- METASTATIC CARCINOMA IN 1 OF 3 LYMPH NODE (1/3), WITH EXTRACAPSULAR EXTENSION. ?  ?06/13/2017 Pathology Results  ? Mammaprint with low risk disease.  ?  ?07/31/2017 - 09/10/2017 Radiation Therapy  ? The patient began a course of radiotherapy over the left chest wall and axillary area, supervised by Dr Eppie Gibson, MD.  ? ?  ?Radiation treatment dates:   07/31/2017 - 09/10/2017 ?  ?Site/dose:    ?1) Left Chest Wall / 50 Gy in 25 fractions ?2) Left Supraclavicular / 45 Gy in 25 fractions ?3) Left Chest Wall Scar Boost / 10 Gy in 5 fractions ?  ?Beams/energy:    ?1) Opposed tangents 3D/ 10 and 6 MV photons ?2) Additional fields 3D / 10 MV and 6 MV photons ?3) En face electrons / 6 MeV electrons ?  ?Narrative: The patient tolerated radiation treatment relatively well. She experienced mild fatigue. She reported pain, burning, and itching to the left breast and swelling under the left arm. On physical exam, she had dry peeling in the left UIQ of the breast and left axilla, as well as diffuse hyperpigmentation and erythema of the left chest wall. She is using the radiaplex cream to the treatment area and neosporin to the peeling areas. ?  ?10/13/2017 -  Anti-estrogen oral therapy  ? Tamoxifen 90m daily ?  ?04/11/2018 Mammogram  ? 04/11/2018 Mammogram ?IMPRESSION: ?No mammographic evidence of malignancy. A result letter of this ?screening mammogram will be mailed directly to the patient. ?  ?04/11/2018 Mammogram  ? Screening Unilateral Right: No evidence of mammographic malignancy.  ?  ? ? ? ?INTERVAL HISTORY:  ?Kristina Nicholson here for a follow up of breast cancer. She was last seen by me on 08/01/21. She presents to the clinic accompanied by her husband. ?She reports she had left-sided flank/back pain recently and took Aleve. ?She notes she has had difficulty obtaining a refill of her tamoxifen, despite that I refilled for her a month ago. ?  ?All other  systems were reviewed with the patient and are negative. ? ?MEDICAL HISTORY:  ?Past Medical History:  ?Diagnosis Date  ? Breast mass   ? lt breast mass ? doesnt know how long  ? Headache   ? History of radiation therapy 07/31/17- 09/10/17  ? LEft chest wass 50 gy in 25 fractions, Left Supraclavicular 45 Gy in 25 fractions, Left Chest wall scar boost 10 Gy in 5 fractions.   ? Malignant neoplasm of upper-inner quadrant of left breast in female, estrogen receptor positive ([place] 04/18/2017  ? Personal history of radiation therapy   ? UTI (urinary tract infection)   ? ? ?SURGICAL HISTORY: ?Past Surgical History:  ?Procedure Laterality Date  ? APPENDECTOMY    ? COLONOSCOPY    ? GANGLION CYST EXCISION Right 09/24/2017  ? Procedure: EXCISION OF RIGHT DORSAL WRIST GANGLION;  Surgeon: TMilly Jakob MD;  [place]  Service: Orthopedics;  Laterality: Right;  ? LIPOMA EXCISION Left 06/13/2017  ? Procedure: EXCISION 3CM LIPOMA ON LEFT ARM;  Surgeon: TJovita Kussmaul MD;  [place]  Service: General;  Laterality: Left;  ? MASTECTOMY Left 2018  ?  MASTECTOMY W/ SENTINEL NODE BIOPSY Left 06/13/2017  ? Procedure: MASTECTOMY WITH SENTINEL LYMPH NODE BIOPSY;  Surgeon: Jovita Kussmaul, MD;  Location: [place];  Service: General;  Laterality: Left;  ? ? ?I have reviewed the social history and family history with the patient and they are unchanged from previous note. ? ?ALLERGIES:  has No Known Allergies. ? ?MEDICATIONS:  ?Current Outpatient Medications  ?Medication Sig Dispense Refill  ? acetaminophen (TYLENOL) 325 MG tablet Take 2 tablets (650 mg total) every 6 (six) hours by mouth.    ? tamoxifen (NOLVADEX) 20 MG tablet TAKE 1 TABLET(20 MG) BY MOUTH DAILY 90 tablet 1  ? ?No current facility-administered medications for this visit.  ? ? ?PHYSICAL EXAMINATION: ?ECOG PERFORMANCE STATUS: 0 - Asymptomatic ? ?Vitals:  ? 01/30/22 0827  ?BP: 136/83  ?Pulse: 60  ?Resp: 17  ?Temp: 98.5 ?F (36.9 ?C)  ?SpO2: 99%  ? ?Wt  Readings from Last 3 Encounters:  ?01/30/22 130 lb 6.4 oz (59.1 kg)  ?08/01/21 127 lb 9 oz (57.9 kg)  ?01/31/21 127 lb 12.8 oz (58 kg)  ?  ? ?GENERAL:alert, no distress and comfortable ?SKIN: skin color, texture,

## 2022-06-21 DIAGNOSIS — N760 Acute vaginitis: Secondary | ICD-10-CM | POA: Diagnosis not present

## 2022-06-21 DIAGNOSIS — N9089 Other specified noninflammatory disorders of vulva and perineum: Secondary | ICD-10-CM | POA: Diagnosis not present

## 2022-06-22 ENCOUNTER — Other Ambulatory Visit: Payer: Self-pay | Admitting: Hematology

## 2022-06-22 DIAGNOSIS — Z170 Estrogen receptor positive status [ER+]: Secondary | ICD-10-CM

## 2022-08-14 DIAGNOSIS — R319 Hematuria, unspecified: Secondary | ICD-10-CM | POA: Diagnosis not present

## 2022-08-14 DIAGNOSIS — Z01419 Encounter for gynecological examination (general) (routine) without abnormal findings: Secondary | ICD-10-CM | POA: Diagnosis not present

## 2022-08-14 DIAGNOSIS — Z124 Encounter for screening for malignant neoplasm of cervix: Secondary | ICD-10-CM | POA: Diagnosis not present

## 2022-08-14 DIAGNOSIS — Z6823 Body mass index (BMI) 23.0-23.9, adult: Secondary | ICD-10-CM | POA: Diagnosis not present

## 2022-08-14 DIAGNOSIS — Z1151 Encounter for screening for human papillomavirus (HPV): Secondary | ICD-10-CM | POA: Diagnosis not present

## 2022-08-14 DIAGNOSIS — N760 Acute vaginitis: Secondary | ICD-10-CM | POA: Diagnosis not present

## 2022-08-21 DIAGNOSIS — R319 Hematuria, unspecified: Secondary | ICD-10-CM | POA: Diagnosis not present

## 2022-08-28 DIAGNOSIS — M7582 Other shoulder lesions, left shoulder: Secondary | ICD-10-CM | POA: Diagnosis not present

## 2022-09-04 DIAGNOSIS — M9903 Segmental and somatic dysfunction of lumbar region: Secondary | ICD-10-CM | POA: Diagnosis not present

## 2022-09-04 DIAGNOSIS — M9907 Segmental and somatic dysfunction of upper extremity: Secondary | ICD-10-CM | POA: Diagnosis not present

## 2022-09-04 DIAGNOSIS — M9901 Segmental and somatic dysfunction of cervical region: Secondary | ICD-10-CM | POA: Diagnosis not present

## 2022-09-04 DIAGNOSIS — M5442 Lumbago with sciatica, left side: Secondary | ICD-10-CM | POA: Diagnosis not present

## 2022-09-04 DIAGNOSIS — S43422A Sprain of left rotator cuff capsule, initial encounter: Secondary | ICD-10-CM | POA: Diagnosis not present

## 2022-09-04 DIAGNOSIS — M5412 Radiculopathy, cervical region: Secondary | ICD-10-CM | POA: Diagnosis not present

## 2022-09-06 DIAGNOSIS — M9903 Segmental and somatic dysfunction of lumbar region: Secondary | ICD-10-CM | POA: Diagnosis not present

## 2022-09-06 DIAGNOSIS — M5412 Radiculopathy, cervical region: Secondary | ICD-10-CM | POA: Diagnosis not present

## 2022-09-06 DIAGNOSIS — M9907 Segmental and somatic dysfunction of upper extremity: Secondary | ICD-10-CM | POA: Diagnosis not present

## 2022-09-06 DIAGNOSIS — M5442 Lumbago with sciatica, left side: Secondary | ICD-10-CM | POA: Diagnosis not present

## 2022-09-06 DIAGNOSIS — S43422A Sprain of left rotator cuff capsule, initial encounter: Secondary | ICD-10-CM | POA: Diagnosis not present

## 2022-09-06 DIAGNOSIS — M9901 Segmental and somatic dysfunction of cervical region: Secondary | ICD-10-CM | POA: Diagnosis not present

## 2022-09-07 DIAGNOSIS — M9903 Segmental and somatic dysfunction of lumbar region: Secondary | ICD-10-CM | POA: Diagnosis not present

## 2022-09-07 DIAGNOSIS — M5442 Lumbago with sciatica, left side: Secondary | ICD-10-CM | POA: Diagnosis not present

## 2022-09-07 DIAGNOSIS — M5412 Radiculopathy, cervical region: Secondary | ICD-10-CM | POA: Diagnosis not present

## 2022-09-07 DIAGNOSIS — M9907 Segmental and somatic dysfunction of upper extremity: Secondary | ICD-10-CM | POA: Diagnosis not present

## 2022-09-07 DIAGNOSIS — S43422A Sprain of left rotator cuff capsule, initial encounter: Secondary | ICD-10-CM | POA: Diagnosis not present

## 2022-09-07 DIAGNOSIS — M9901 Segmental and somatic dysfunction of cervical region: Secondary | ICD-10-CM | POA: Diagnosis not present

## 2022-09-08 DIAGNOSIS — M5442 Lumbago with sciatica, left side: Secondary | ICD-10-CM | POA: Diagnosis not present

## 2022-09-08 DIAGNOSIS — M9903 Segmental and somatic dysfunction of lumbar region: Secondary | ICD-10-CM | POA: Diagnosis not present

## 2022-09-08 DIAGNOSIS — M5412 Radiculopathy, cervical region: Secondary | ICD-10-CM | POA: Diagnosis not present

## 2022-09-08 DIAGNOSIS — S43422A Sprain of left rotator cuff capsule, initial encounter: Secondary | ICD-10-CM | POA: Diagnosis not present

## 2022-09-08 DIAGNOSIS — M9907 Segmental and somatic dysfunction of upper extremity: Secondary | ICD-10-CM | POA: Diagnosis not present

## 2022-09-08 DIAGNOSIS — M9901 Segmental and somatic dysfunction of cervical region: Secondary | ICD-10-CM | POA: Diagnosis not present

## 2022-09-11 DIAGNOSIS — M25512 Pain in left shoulder: Secondary | ICD-10-CM | POA: Diagnosis not present

## 2022-09-12 DIAGNOSIS — M5442 Lumbago with sciatica, left side: Secondary | ICD-10-CM | POA: Diagnosis not present

## 2022-09-12 DIAGNOSIS — M9903 Segmental and somatic dysfunction of lumbar region: Secondary | ICD-10-CM | POA: Diagnosis not present

## 2022-09-12 DIAGNOSIS — M9901 Segmental and somatic dysfunction of cervical region: Secondary | ICD-10-CM | POA: Diagnosis not present

## 2022-09-12 DIAGNOSIS — M5412 Radiculopathy, cervical region: Secondary | ICD-10-CM | POA: Diagnosis not present

## 2022-09-12 DIAGNOSIS — M9907 Segmental and somatic dysfunction of upper extremity: Secondary | ICD-10-CM | POA: Diagnosis not present

## 2022-09-12 DIAGNOSIS — S43422A Sprain of left rotator cuff capsule, initial encounter: Secondary | ICD-10-CM | POA: Diagnosis not present

## 2022-09-13 DIAGNOSIS — M5442 Lumbago with sciatica, left side: Secondary | ICD-10-CM | POA: Diagnosis not present

## 2022-09-13 DIAGNOSIS — M5412 Radiculopathy, cervical region: Secondary | ICD-10-CM | POA: Diagnosis not present

## 2022-09-13 DIAGNOSIS — M9903 Segmental and somatic dysfunction of lumbar region: Secondary | ICD-10-CM | POA: Diagnosis not present

## 2022-09-13 DIAGNOSIS — M9901 Segmental and somatic dysfunction of cervical region: Secondary | ICD-10-CM | POA: Diagnosis not present

## 2022-09-13 DIAGNOSIS — S43422A Sprain of left rotator cuff capsule, initial encounter: Secondary | ICD-10-CM | POA: Diagnosis not present

## 2022-09-15 DIAGNOSIS — M5442 Lumbago with sciatica, left side: Secondary | ICD-10-CM | POA: Diagnosis not present

## 2022-09-15 DIAGNOSIS — M9901 Segmental and somatic dysfunction of cervical region: Secondary | ICD-10-CM | POA: Diagnosis not present

## 2022-09-15 DIAGNOSIS — M9903 Segmental and somatic dysfunction of lumbar region: Secondary | ICD-10-CM | POA: Diagnosis not present

## 2022-09-15 DIAGNOSIS — S43422A Sprain of left rotator cuff capsule, initial encounter: Secondary | ICD-10-CM | POA: Diagnosis not present

## 2022-09-15 DIAGNOSIS — M5412 Radiculopathy, cervical region: Secondary | ICD-10-CM | POA: Diagnosis not present

## 2022-09-19 DIAGNOSIS — M9901 Segmental and somatic dysfunction of cervical region: Secondary | ICD-10-CM | POA: Diagnosis not present

## 2022-09-19 DIAGNOSIS — M5442 Lumbago with sciatica, left side: Secondary | ICD-10-CM | POA: Diagnosis not present

## 2022-09-19 DIAGNOSIS — S43422A Sprain of left rotator cuff capsule, initial encounter: Secondary | ICD-10-CM | POA: Diagnosis not present

## 2022-09-19 DIAGNOSIS — M9903 Segmental and somatic dysfunction of lumbar region: Secondary | ICD-10-CM | POA: Diagnosis not present

## 2022-09-19 DIAGNOSIS — M5412 Radiculopathy, cervical region: Secondary | ICD-10-CM | POA: Diagnosis not present

## 2022-09-20 DIAGNOSIS — M9901 Segmental and somatic dysfunction of cervical region: Secondary | ICD-10-CM | POA: Diagnosis not present

## 2022-09-20 DIAGNOSIS — M9903 Segmental and somatic dysfunction of lumbar region: Secondary | ICD-10-CM | POA: Diagnosis not present

## 2022-09-20 DIAGNOSIS — M5442 Lumbago with sciatica, left side: Secondary | ICD-10-CM | POA: Diagnosis not present

## 2022-09-20 DIAGNOSIS — M5412 Radiculopathy, cervical region: Secondary | ICD-10-CM | POA: Diagnosis not present

## 2022-09-20 DIAGNOSIS — S43422A Sprain of left rotator cuff capsule, initial encounter: Secondary | ICD-10-CM | POA: Diagnosis not present

## 2022-09-22 DIAGNOSIS — M5412 Radiculopathy, cervical region: Secondary | ICD-10-CM | POA: Diagnosis not present

## 2022-09-22 DIAGNOSIS — M5442 Lumbago with sciatica, left side: Secondary | ICD-10-CM | POA: Diagnosis not present

## 2022-09-22 DIAGNOSIS — M9903 Segmental and somatic dysfunction of lumbar region: Secondary | ICD-10-CM | POA: Diagnosis not present

## 2022-09-22 DIAGNOSIS — M9901 Segmental and somatic dysfunction of cervical region: Secondary | ICD-10-CM | POA: Diagnosis not present

## 2022-09-22 DIAGNOSIS — S43422A Sprain of left rotator cuff capsule, initial encounter: Secondary | ICD-10-CM | POA: Diagnosis not present

## 2022-09-26 DIAGNOSIS — M9901 Segmental and somatic dysfunction of cervical region: Secondary | ICD-10-CM | POA: Diagnosis not present

## 2022-09-26 DIAGNOSIS — S43422A Sprain of left rotator cuff capsule, initial encounter: Secondary | ICD-10-CM | POA: Diagnosis not present

## 2022-09-26 DIAGNOSIS — M5442 Lumbago with sciatica, left side: Secondary | ICD-10-CM | POA: Diagnosis not present

## 2022-09-26 DIAGNOSIS — M5412 Radiculopathy, cervical region: Secondary | ICD-10-CM | POA: Diagnosis not present

## 2022-09-26 DIAGNOSIS — M9903 Segmental and somatic dysfunction of lumbar region: Secondary | ICD-10-CM | POA: Diagnosis not present

## 2022-09-27 DIAGNOSIS — S43422A Sprain of left rotator cuff capsule, initial encounter: Secondary | ICD-10-CM | POA: Diagnosis not present

## 2022-09-27 DIAGNOSIS — M9903 Segmental and somatic dysfunction of lumbar region: Secondary | ICD-10-CM | POA: Diagnosis not present

## 2022-09-27 DIAGNOSIS — M5442 Lumbago with sciatica, left side: Secondary | ICD-10-CM | POA: Diagnosis not present

## 2022-09-27 DIAGNOSIS — M5412 Radiculopathy, cervical region: Secondary | ICD-10-CM | POA: Diagnosis not present

## 2022-09-27 DIAGNOSIS — M9901 Segmental and somatic dysfunction of cervical region: Secondary | ICD-10-CM | POA: Diagnosis not present

## 2022-09-29 DIAGNOSIS — M9903 Segmental and somatic dysfunction of lumbar region: Secondary | ICD-10-CM | POA: Diagnosis not present

## 2022-09-29 DIAGNOSIS — M5442 Lumbago with sciatica, left side: Secondary | ICD-10-CM | POA: Diagnosis not present

## 2022-09-29 DIAGNOSIS — S43422A Sprain of left rotator cuff capsule, initial encounter: Secondary | ICD-10-CM | POA: Diagnosis not present

## 2022-09-29 DIAGNOSIS — M9901 Segmental and somatic dysfunction of cervical region: Secondary | ICD-10-CM | POA: Diagnosis not present

## 2022-09-29 DIAGNOSIS — M5412 Radiculopathy, cervical region: Secondary | ICD-10-CM | POA: Diagnosis not present

## 2022-10-02 ENCOUNTER — Ambulatory Visit
Admission: RE | Admit: 2022-10-02 | Discharge: 2022-10-02 | Disposition: A | Discharge status: Home / Self Care | Payer: BC Managed Care – PPO | Source: Ambulatory Visit | Attending: Hematology | Admitting: Hematology

## 2022-10-02 DIAGNOSIS — E2839 Other primary ovarian failure: Secondary | ICD-10-CM

## 2022-10-02 DIAGNOSIS — Z1231 Encounter for screening mammogram for malignant neoplasm of breast: Secondary | ICD-10-CM

## 2022-10-02 DIAGNOSIS — Z780 Asymptomatic menopausal state: Secondary | ICD-10-CM | POA: Diagnosis not present

## 2022-10-02 DIAGNOSIS — M85852 Other specified disorders of bone density and structure, left thigh: Secondary | ICD-10-CM | POA: Diagnosis not present

## 2022-10-03 DIAGNOSIS — S43422A Sprain of left rotator cuff capsule, initial encounter: Secondary | ICD-10-CM | POA: Diagnosis not present

## 2022-10-03 DIAGNOSIS — M5412 Radiculopathy, cervical region: Secondary | ICD-10-CM | POA: Diagnosis not present

## 2022-10-03 DIAGNOSIS — M5442 Lumbago with sciatica, left side: Secondary | ICD-10-CM | POA: Diagnosis not present

## 2022-10-03 DIAGNOSIS — M9903 Segmental and somatic dysfunction of lumbar region: Secondary | ICD-10-CM | POA: Diagnosis not present

## 2022-10-04 DIAGNOSIS — M9903 Segmental and somatic dysfunction of lumbar region: Secondary | ICD-10-CM | POA: Diagnosis not present

## 2022-10-04 DIAGNOSIS — M5412 Radiculopathy, cervical region: Secondary | ICD-10-CM | POA: Diagnosis not present

## 2022-10-04 DIAGNOSIS — S43422A Sprain of left rotator cuff capsule, initial encounter: Secondary | ICD-10-CM | POA: Diagnosis not present

## 2022-10-04 DIAGNOSIS — M5442 Lumbago with sciatica, left side: Secondary | ICD-10-CM | POA: Diagnosis not present

## 2022-10-10 ENCOUNTER — Telehealth: Payer: Self-pay

## 2022-10-10 DIAGNOSIS — M5412 Radiculopathy, cervical region: Secondary | ICD-10-CM | POA: Diagnosis not present

## 2022-10-10 DIAGNOSIS — S43422A Sprain of left rotator cuff capsule, initial encounter: Secondary | ICD-10-CM | POA: Diagnosis not present

## 2022-10-10 DIAGNOSIS — M5442 Lumbago with sciatica, left side: Secondary | ICD-10-CM | POA: Diagnosis not present

## 2022-10-10 DIAGNOSIS — M9903 Segmental and somatic dysfunction of lumbar region: Secondary | ICD-10-CM | POA: Diagnosis not present

## 2022-10-10 NOTE — Patient Outreach (Signed)
  Care Coordination   Initial Visit Note   10/10/2022 Name: Aarianna Hoadley MRN: [id number] DOB: December 23, 1965  Cordie Shakerria Parran is a 56 y.o. year old female who sees Patient, No Pcp Per for primary care. I spoke with  Gregory [name] by phone today.  What matters to the patients health and wellness today? RNCM attempted to explain care coordination program to patient and ?spouse. Spouse states patient has no needs. Call noted to be disconnected. RNCM call back. Voice message picked up. RNCM left voice message to contact if care coordination needs in the future contact information provided.     SDOH assessments and interventions completed:  No  Care Coordination Interventions:  No, not indicated   Follow up plan: No further intervention required.   Encounter Outcome:  Pt. Refused   Thea Silversmith, RN, MSN, BSN, [place] Coordinator [phone]

## 2022-10-17 DIAGNOSIS — M5412 Radiculopathy, cervical region: Secondary | ICD-10-CM | POA: Diagnosis not present

## 2022-10-17 DIAGNOSIS — S43422A Sprain of left rotator cuff capsule, initial encounter: Secondary | ICD-10-CM | POA: Diagnosis not present

## 2022-10-17 DIAGNOSIS — M5442 Lumbago with sciatica, left side: Secondary | ICD-10-CM | POA: Diagnosis not present

## 2022-10-17 DIAGNOSIS — M9903 Segmental and somatic dysfunction of lumbar region: Secondary | ICD-10-CM | POA: Diagnosis not present

## 2022-10-19 DIAGNOSIS — M5412 Radiculopathy, cervical region: Secondary | ICD-10-CM | POA: Diagnosis not present

## 2022-10-19 DIAGNOSIS — M5442 Lumbago with sciatica, left side: Secondary | ICD-10-CM | POA: Diagnosis not present

## 2022-10-19 DIAGNOSIS — M9903 Segmental and somatic dysfunction of lumbar region: Secondary | ICD-10-CM | POA: Diagnosis not present

## 2022-10-19 DIAGNOSIS — S43422A Sprain of left rotator cuff capsule, initial encounter: Secondary | ICD-10-CM | POA: Diagnosis not present

## 2022-10-24 DIAGNOSIS — M5442 Lumbago with sciatica, left side: Secondary | ICD-10-CM | POA: Diagnosis not present

## 2022-10-24 DIAGNOSIS — M9903 Segmental and somatic dysfunction of lumbar region: Secondary | ICD-10-CM | POA: Diagnosis not present

## 2022-10-24 DIAGNOSIS — S43422A Sprain of left rotator cuff capsule, initial encounter: Secondary | ICD-10-CM | POA: Diagnosis not present

## 2022-10-24 DIAGNOSIS — M5412 Radiculopathy, cervical region: Secondary | ICD-10-CM | POA: Diagnosis not present

## 2022-10-26 DIAGNOSIS — M5442 Lumbago with sciatica, left side: Secondary | ICD-10-CM | POA: Diagnosis not present

## 2022-10-26 DIAGNOSIS — M9903 Segmental and somatic dysfunction of lumbar region: Secondary | ICD-10-CM | POA: Diagnosis not present

## 2022-10-26 DIAGNOSIS — S43422A Sprain of left rotator cuff capsule, initial encounter: Secondary | ICD-10-CM | POA: Diagnosis not present

## 2022-10-26 DIAGNOSIS — M5412 Radiculopathy, cervical region: Secondary | ICD-10-CM | POA: Diagnosis not present

## 2022-10-29 DIAGNOSIS — M8580 Other specified disorders of bone density and structure, unspecified site: Secondary | ICD-10-CM | POA: Insufficient documentation

## 2022-10-29 NOTE — Assessment & Plan Note (Signed)
-  her first DEXA scan on 10/02/2022 showed T score -1.8 at left femur neck. No risk for fracture. -I encourage her to take calcium and VitD supplement -we discussed that tamoxifen may transfer her bone.

## 2022-10-29 NOTE — Assessment & Plan Note (Signed)
[name], stage IIB, ER: Positive, PR: Negative, HER2: Negative, Grade 3 -She was diagnosed in 04/2017. She is s/p left breast mastectomy and adjuvant radiation. Mammaprint showed low risk. -She started antiestrogen therapy with Tamoxifen in 10/2017. She is tolerating well, plan for 10 year. She has not had a menstrual period since 2018.  Her [place] in March 2021 showed that she is still premenopausal

## 2022-10-30 ENCOUNTER — Inpatient Hospital Stay: Payer: BC Managed Care – PPO | Attending: Hematology | Admitting: Hematology

## 2022-10-30 ENCOUNTER — Inpatient Hospital Stay: Payer: BC Managed Care – PPO

## 2022-10-30 ENCOUNTER — Encounter: Payer: Self-pay | Admitting: Hematology

## 2022-10-30 ENCOUNTER — Other Ambulatory Visit: Payer: Self-pay

## 2022-10-30 VITALS — BP 122/78 | HR 68 | Temp 97.9°F | Resp 15 | Ht 63.0 in | Wt 128.9 lb

## 2022-10-30 DIAGNOSIS — Z170 Estrogen receptor positive status [ER+]: Secondary | ICD-10-CM | POA: Insufficient documentation

## 2022-10-30 DIAGNOSIS — Z9012 Acquired absence of left breast and nipple: Secondary | ICD-10-CM | POA: Diagnosis not present

## 2022-10-30 DIAGNOSIS — C50312 Malignant neoplasm of lower-inner quadrant of left female breast: Secondary | ICD-10-CM | POA: Diagnosis not present

## 2022-10-30 DIAGNOSIS — C50212 Malignant neoplasm of upper-inner quadrant of left female breast: Secondary | ICD-10-CM | POA: Diagnosis not present

## 2022-10-30 DIAGNOSIS — M85852 Other specified disorders of bone density and structure, left thigh: Secondary | ICD-10-CM | POA: Diagnosis not present

## 2022-10-30 DIAGNOSIS — Z79810 Long term (current) use of selective estrogen receptor modulators (SERMs): Secondary | ICD-10-CM | POA: Insufficient documentation

## 2022-10-30 DIAGNOSIS — Z923 Personal history of irradiation: Secondary | ICD-10-CM | POA: Insufficient documentation

## 2022-10-30 DIAGNOSIS — M8580 Other specified disorders of bone density and structure, unspecified site: Secondary | ICD-10-CM | POA: Insufficient documentation

## 2022-10-30 LAB — CBC WITH DIFFERENTIAL (CANCER CENTER ONLY)
Abs Immature Granulocytes: 0.01 10*3/uL (ref 0.00–0.07)
Basophils Absolute: 0 10*3/uL (ref 0.0–0.1)
Basophils Relative: 0 %
Eosinophils Absolute: 0.1 10*3/uL (ref 0.0–0.5)
Eosinophils Relative: 1 %
HCT: 37 % (ref 36.0–46.0)
Hemoglobin: 12.2 g/dL (ref 12.0–15.0)
Immature Granulocytes: 0 %
Lymphocytes Relative: 36 %
Lymphs Abs: 1.7 10*3/uL (ref 0.7–4.0)
MCH: 30.8 pg (ref 26.0–34.0)
MCHC: 33 g/dL (ref 30.0–36.0)
MCV: 93.4 fL (ref 80.0–100.0)
Monocytes Absolute: 0.4 10*3/uL (ref 0.1–1.0)
Monocytes Relative: 9 %
Neutro Abs: 2.6 10*3/uL (ref 1.7–7.7)
Neutrophils Relative %: 54 %
Platelet Count: 242 10*3/uL (ref 150–400)
RBC: 3.96 MIL/uL (ref 3.87–5.11)
RDW: 13.2 % (ref 11.5–15.5)
WBC Count: 4.9 10*3/uL (ref 4.0–10.5)
nRBC: 0 % (ref 0.0–0.2)

## 2022-10-30 LAB — CMP (CANCER CENTER ONLY)
ALT: 10 U/L (ref 0–44)
AST: 14 U/L — ABNORMAL LOW (ref 15–41)
Albumin: 4 g/dL (ref 3.5–5.0)
Alkaline Phosphatase: 48 U/L (ref 38–126)
Anion gap: 5 (ref 5–15)
BUN: 12 mg/dL (ref 6–20)
CO2: 29 mmol/L (ref 22–32)
Calcium: 9.3 mg/dL (ref 8.9–10.3)
Chloride: 107 mmol/L (ref 98–111)
Creatinine: 0.66 mg/dL (ref 0.44–1.00)
GFR, Estimated: >60 mL/min (ref >60)
Glucose, Bld: 107 mg/dL — ABNORMAL HIGH (ref 70–99)
Potassium: 4 mmol/L (ref 3.5–5.1)
Sodium: 141 mmol/L (ref 135–145)
Total Bilirubin: 0.3 mg/dL (ref 0.3–1.2)
Total Protein: 6.3 g/dL — ABNORMAL LOW (ref 6.5–8.1)

## 2022-10-30 MED ORDER — TAMOXIFEN CITRATE 20 MG PO TABS: 20.0000 mg | ORAL_TABLET | Freq: Every day | ORAL | 0 refills | Status: DC

## 2022-10-30 NOTE — Progress Notes (Signed)
[facility]   Telephone:(336) [phone] Fax:(336) [phone]   Clinic Follow up Note   Patient Care Team: Patient, No Pcp Per as PCP - General ([place]) [name], MD as Consulting Physician (Hematology) Jovita Kussmaul, MD as Consulting Physician (General Surgery) Eppie Gibson, MD as Attending Physician (Radiation Oncology) 10/30/2022  CHIEF COMPLAINT: follow up breast cancer    SUMMARY OF ONCOLOGIC HISTORY: Oncology History Overview Note  Cancer Staging Malignant neoplasm of upper-inner quadrant of left breast in female, estrogen receptor positive ([place]) Staging form: Breast, AJCC 8th Edition - Clinical stage from 04/13/2017: Stage IIA (cT2, cN0, cM0, G2, ER: Positive, PR: Negative, HER2: Negative) - Signed by [name], MD on 04/25/2017 - Pathologic stage from 06/13/2017: Stage IIB (pT2, pN1a, cM0, G1, ER: Positive, PR: Negative, HER2: Negative) - Signed by [name], MD on 09/04/2017     Malignant neoplasm of upper-inner quadrant of left breast in female, estrogen receptor positive ([place])  04/10/2017 Mammogram   Left breast mammo and US showed an irregular hypoechoic shadowing mass at 9 o'clock 1 cm from the nipple measuring 2.3 x 1 x 2.4 cm. This corresponds well with the mass seen at mammography. No lymphadenopathy seen in the left axilla.   04/18/2017 Initial Diagnosis   Malignant neoplasm of upper-inner quadrant of left breast in female, estrogen receptor positive ([place])   04/22/2017 Receptors her2   Estrogen Receptor: 95%, POSITIVE, STRONG STAINING INTENSITY Progesterone Receptor: 0%, NEGATIVE Proliferation Marker Ki67: 10% HER2 -   04/22/2017 Initial Biopsy   Breast, left, needle core biopsy, 9:30 o'clock - INVASIVE DUCTAL CARCINOMA, G2 - DUCTAL CARCINOMA IN SITU.   06/13/2017 Surgery   Left breast mastectomy with sentinel lymph node biopsy performed by Dr Marlou Starks.    06/13/2017 Pathology Results   Breast, simple mastectomy, Left - INVASIVE DUCTAL CARCINOMA,  GRADE I/III, SPANNING 2.5 CM. - DUCTAL CARCINOMA IN SITU, INTERMEDIATE GRADE. - LOBULAR NEOPLASIA (ATYPICAL LOBULAR HYPERPLASIA). - THE SURGICAL RESECTION MARGINS ARE NEGATIVE FOR CARCINOMA.  Lymph node, sentinel, biopsy, Left axillary #1 - METASTATIC CARCINOMA IN 1 OF 3 LYMPH NODE (1/3), WITH EXTRACAPSULAR EXTENSION.   06/13/2017 Pathology Results   Mammaprint with low risk disease.    07/31/2017 - 09/10/2017 Radiation Therapy   The patient began a course of radiotherapy over the left chest wall and axillary area, supervised by Dr Eppie Gibson, MD.     Radiation treatment dates:   07/31/2017 - 09/10/2017   Site/dose:    1) Left Chest Wall / 50 Gy in 25 fractions 2) Left Supraclavicular / 45 Gy in 25 fractions 3) Left Chest Wall Scar Boost / 10 Gy in 5 fractions   Beams/energy:    1) Opposed tangents 3D/ 10 and 6 MV photons 2) Additional fields 3D / 10 MV and 6 MV photons 3) En face electrons / 6 MeV electrons   Narrative: The patient tolerated radiation treatment relatively well. She experienced mild fatigue. She reported pain, burning, and itching to the left breast and swelling under the left arm. On physical exam, she had dry peeling in the left UIQ of the breast and left axilla, as well as diffuse hyperpigmentation and erythema of the left chest wall. She is using the radiaplex cream to the treatment area and neosporin to the peeling areas.   10/13/2017 -  Anti-estrogen oral therapy   Tamoxifen 68m daily   04/11/2018 Mammogram   04/11/2018 Mammogram IMPRESSION: No mammographic evidence of malignancy. A result letter of this screening mammogram will  be mailed directly to the patient.   04/11/2018 Mammogram   Screening Unilateral Right: No evidence of mammographic malignancy.      CURRENT THERAPY: Adjuvant tamoxifen  INTERVAL HISTORY: She is here for f/u of her breast cancer.  She was last seen by me in March 2023.  She is accompanied by her husband to clinic today.  Left  should pain for 2 months, no injury prior to that.  She had significant pain and difficulty to raise her left arm at the beginning, was seen by orthopedic surgeon, and received a steroid injection.  Her overall pain has improved, she is able to move her left arm without restriction.  She denies any other new pain or other new symptoms, she is tolerating tamoxifen well.  REVIEW OF SYSTEMS:   Constitutional: Denies fevers, chills or abnormal weight loss Eyes: Denies blurriness of vision Ears, nose, mouth, throat, and face: Denies mucositis or sore throat Respiratory: Denies cough, dyspnea or wheezes Cardiovascular: Denies palpitation, chest discomfort or lower extremity swelling Gastrointestinal:  Denies nausea, heartburn or change in bowel habits Skin: Denies abnormal skin rashes Lymphatics: Denies new lymphadenopathy or easy bruising Neurological:Denies numbness, tingling or new weaknesses Behavioral/Psych: Mood is stable, no new changes  All other systems were reviewed with the patient and are negative.  MEDICAL HISTORY:  Past Medical History:  Diagnosis Date   Breast mass    lt breast mass ? doesnt know how long   Headache    History of radiation therapy 07/31/17- 09/10/17   LEft chest wass 50 gy in 25 fractions, Left Supraclavicular 45 Gy in 25 fractions, Left Chest wall scar boost 10 Gy in 5 fractions.    Malignant neoplasm of upper-inner quadrant of left breast in female, estrogen receptor positive ([place]) 04/18/2017   Personal history of radiation therapy    UTI (urinary tract infection)     SURGICAL HISTORY: Past Surgical History:  Procedure Laterality Date   APPENDECTOMY     COLONOSCOPY     GANGLION CYST EXCISION Right 09/24/2017   Procedure: EXCISION OF RIGHT DORSAL WRIST GANGLION;  Surgeon: Milly Jakob, MD;  Location: [place];  Service: Orthopedics;  Laterality: Right;   LIPOMA EXCISION Left 06/13/2017   Procedure: EXCISION 3CM LIPOMA ON LEFT ARM;   Surgeon: Jovita Kussmaul, MD;  Location: [place];  Service: General;  Laterality: Left;   MASTECTOMY Left 2018   MASTECTOMY W/ SENTINEL NODE BIOPSY Left 06/13/2017   Procedure: MASTECTOMY WITH SENTINEL LYMPH NODE BIOPSY;  Surgeon: Jovita Kussmaul, MD;  Location: [place];  Service: General;  Laterality: Left;    I have reviewed the social history and family history with the patient and they are unchanged from previous note.  ALLERGIES:  has No Known Allergies.  MEDICATIONS:  Current Outpatient Medications  Medication Sig Dispense Refill   acetaminophen (TYLENOL) 325 MG tablet Take 2 tablets (650 mg total) every 6 (six) hours by mouth.     tamoxifen (NOLVADEX) 20 MG tablet TAKE 1 TABLET(20 MG) BY MOUTH DAILY 90 tablet 1   No current facility-administered medications for this visit.    PHYSICAL EXAMINATION: ECOG PERFORMANCE STATUS: 0 - Asymptomatic  Vitals:   10/30/22 0949  BP: 122/78  Pulse: 68  Resp: 15  Temp: 97.9 F (36.6 C)  SpO2: 98%   Filed Weights   10/30/22 0949  Weight: 128 lb 14.4 oz (58.5 kg)    GENERAL:alert, no distress and comfortable SKIN: skin color, texture, turgor are normal, no  rashes or significant lesions EYES: normal, Conjunctiva are pink and non-injected, sclera clear OROPHARYNX:no exudate, no erythema and lips, buccal mucosa, and tongue normal  NECK: supple, thyroid normal size, non-tender, without nodularity LYMPH:  no palpable lymphadenopathy in the cervical, axillary or inguinal LUNGS: clear to auscultation and percussion with normal breathing effort HEART: regular rate & rhythm and no murmurs and no lower extremity edema ABDOMEN:abdomen soft, non-tender and normal bowel sounds Musculoskeletal:no cyanosis of digits and no clubbing  NEURO: alert & oriented x 3 with fluent speech, no focal motor/sensory deficits  LABORATORY DATA:  I have reviewed the data as listed    Latest Ref Rng & Units 10/30/2022    8:45 AM 01/30/2022    8:11 AM 08/01/2021     8:02 AM  CBC  WBC 4.0 - 10.5 K/uL 4.9  5.0  4.6   Hemoglobin 12.0 - 15.0 g/dL 12.2  12.1  11.5   Hematocrit 36.0 - 46.0 % 37.0  35.2  34.7   Platelets 150 - 400 K/uL 242  229  207         Latest Ref Rng & Units 10/30/2022    8:45 AM 01/30/2022    8:11 AM 08/01/2021    8:02 AM  CMP  Glucose 70 - 99 mg/dL 107  101  89   BUN 6 - 20 mg/dL _0 Creatinine 0.44 - 1.00 mg/dL 0.66  0.62  0.71   Sodium 135 - 145 mmol/L 141  140  143   Potassium 3.5 - 5.1 mmol/L 4.0  3.9  3.7   Chloride 98 - 111 mmol/L 107  108  108   CO2 22 - 32 mmol/L _1 Calcium 8.9 - 10.3 mg/dL 9.3  9.0  9.0   Total Protein 6.5 - 8.1 g/dL 6.3  6.8  6.4   Total Bilirubin 0.3 - 1.2 mg/dL 0.3  0.4  0.5   Alkaline Phos 38 - 126 U/L 48  40  41   AST 15 - 41 U/L _2 ALT 0 - 44 U/L _3 RADIOGRAPHIC STUDIES: I have personally reviewed the radiological images as listed and agreed with the findings in the report. No results found.   ASSESSMENT & PLAN:  Malignant neoplasm of upper-inner quadrant of left breast in female, estrogen receptor positive ([facility]) [name], stage IIB, ER: Positive, PR: Negative, HER2: Negative, Grade 3 -She was diagnosed in 04/2017. She is s/p left breast mastectomy and adjuvant radiation. Mammaprint showed low risk. -She started antiestrogen therapy with Tamoxifen in 10/2017. She is tolerating well, plan for 10 year. She has not had a menstrual period since 2018.  Her [place] in March 2021 showed that she is still premenopausal  Osteopenia -her first DEXA scan on 10/02/2022 showed T score -1.8 at left femur neck. No risk for fracture. -I encourage her to take calcium and VitD supplement -we discussed that tamoxifen may transfer her bone.  Plan -I refilled tamoxifen for her, and wrote a letter for her medical reason for early refill and travel with 6 months of supply, since she was in [place] for 5 months -I will see her back in 1 year. -We discussed physical  therapy referral if her left shoulder pain gets worse    No orders of the defined types were placed in this encounter.  All questions were answered. The patient knows  to call the clinic with any problems, questions or concerns. No barriers to learning was detected. I spent 25 minutes counseling the patient face to face. The total time spent in the appointment was 30 minutes and more than 50% was on counseling and review of test results     [name], MD 10/30/22

## 2022-10-31 DIAGNOSIS — M5442 Lumbago with sciatica, left side: Secondary | ICD-10-CM | POA: Diagnosis not present

## 2022-10-31 DIAGNOSIS — M5412 Radiculopathy, cervical region: Secondary | ICD-10-CM | POA: Diagnosis not present

## 2022-10-31 DIAGNOSIS — S43422A Sprain of left rotator cuff capsule, initial encounter: Secondary | ICD-10-CM | POA: Diagnosis not present

## 2022-10-31 DIAGNOSIS — M9903 Segmental and somatic dysfunction of lumbar region: Secondary | ICD-10-CM | POA: Diagnosis not present

## 2022-10-31 LAB — FOLLICLE STIMULATING HORMONE: FSH: 9.3 m[IU]/mL

## 2022-12-01 DIAGNOSIS — J018 Other acute sinusitis: Secondary | ICD-10-CM | POA: Diagnosis not present

## 2023-05-14 ENCOUNTER — Telehealth: Payer: Self-pay | Admitting: Hematology

## 2023-05-14 ENCOUNTER — Other Ambulatory Visit: Payer: Self-pay

## 2023-05-14 DIAGNOSIS — C7951 Secondary malignant neoplasm of bone: Secondary | ICD-10-CM

## 2023-05-14 HISTORY — DX: Secondary malignant neoplasm of bone: C79.51

## 2023-05-31 ENCOUNTER — Other Ambulatory Visit: Payer: Self-pay

## 2023-05-31 DIAGNOSIS — Z170 Estrogen receptor positive status [ER+]: Secondary | ICD-10-CM

## 2023-06-01 ENCOUNTER — Inpatient Hospital Stay (HOSPITAL_BASED_OUTPATIENT_CLINIC_OR_DEPARTMENT_OTHER): Payer: BC Managed Care – PPO | Admitting: Hematology

## 2023-06-01 ENCOUNTER — Other Ambulatory Visit: Payer: Self-pay

## 2023-06-01 ENCOUNTER — Inpatient Hospital Stay: Payer: BC Managed Care – PPO | Attending: Hematology

## 2023-06-01 ENCOUNTER — Encounter: Payer: Self-pay | Admitting: Hematology

## 2023-06-01 VITALS — BP 118/66 | HR 60 | Temp 97.7°F | Resp 18 | Ht 63.0 in | Wt 125.8 lb

## 2023-06-01 DIAGNOSIS — Z79810 Long term (current) use of selective estrogen receptor modulators (SERMs): Secondary | ICD-10-CM | POA: Insufficient documentation

## 2023-06-01 DIAGNOSIS — C50212 Malignant neoplasm of upper-inner quadrant of left female breast: Secondary | ICD-10-CM

## 2023-06-01 DIAGNOSIS — Z9012 Acquired absence of left breast and nipple: Secondary | ICD-10-CM | POA: Insufficient documentation

## 2023-06-01 DIAGNOSIS — Z170 Estrogen receptor positive status [ER+]: Secondary | ICD-10-CM

## 2023-06-01 LAB — CMP (CANCER CENTER ONLY)
ALT: 12 U/L (ref 0–44)
AST: 14 U/L — ABNORMAL LOW (ref 15–41)
Albumin: 4 g/dL (ref 3.5–5.0)
Alkaline Phosphatase: 52 U/L (ref 38–126)
Anion gap: 6 (ref 5–15)
BUN: 11 mg/dL (ref 6–20)
CO2: 29 mmol/L (ref 22–32)
Calcium: 9.2 mg/dL (ref 8.9–10.3)
Chloride: 108 mmol/L (ref 98–111)
Creatinine: 0.65 mg/dL (ref 0.44–1.00)
GFR, Estimated: >60 mL/min (ref >60)
Glucose, Bld: 83 mg/dL (ref 70–99)
Potassium: 4 mmol/L (ref 3.5–5.1)
Sodium: 143 mmol/L (ref 135–145)
Total Bilirubin: 0.6 mg/dL (ref 0.3–1.2)
Total Protein: 6.6 g/dL (ref 6.5–8.1)

## 2023-06-01 LAB — CBC WITH DIFFERENTIAL (CANCER CENTER ONLY)
Basophils Absolute: 0 10*3/uL (ref 0.0–0.1)
Basophils Relative: 0 %
Eosinophils Absolute: 0.1 10*3/uL (ref 0.0–0.5)
Eosinophils Relative: 1 %
HCT: 35.7 % — ABNORMAL LOW (ref 36.0–46.0)
Hemoglobin: 12.1 g/dL (ref 12.0–15.0)
Lymphocytes Relative: 40 %
Lymphs Abs: 2.3 10*3/uL (ref 0.7–4.0)
MCH: 30.9 pg (ref 26.0–34.0)
MCHC: 33.9 g/dL (ref 30.0–36.0)
MCV: 91.3 fL (ref 80.0–100.0)
Monocytes Absolute: 0.5 10*3/uL (ref 0.1–1.0)
Monocytes Relative: 9 %
Neutro Abs: 2.8 10*3/uL (ref 1.7–7.7)
Neutrophils Relative %: 49 %
Platelet Count: 220 10*3/uL (ref 150–400)
RBC: 3.91 MIL/uL (ref 3.87–5.11)
RDW: 13.2 % (ref 11.5–15.5)
WBC Count: 5.7 10*3/uL (ref 4.0–10.5)
nRBC: 0 % (ref 0.0–0.2)

## 2023-06-01 NOTE — Progress Notes (Signed)
[facility]   Telephone:(336) [phone] Fax:(336) [phone]   Clinic Follow up Note   Patient Care Team: Patient, No Pcp Per as PCP - General (General Practice) Malachy Mood, MD as Consulting Physician (Hematology) Griselda Miner, MD as Consulting Physician (General Surgery) Lonie Peak, MD as Attending Physician (Radiation Oncology) 06/01/2023  CHIEF COMPLAINT: follow up breast cancer    SUMMARY OF ONCOLOGIC HISTORY: Oncology History Overview Note  Cancer Staging Malignant neoplasm of upper-inner quadrant of left breast in female, estrogen receptor positive ([facility]) Staging form: Breast, AJCC 8th Edition - Clinical stage from 04/13/2017: Stage IIA (cT2, cN0, cM0, G2, ER: Positive, PR: Negative, HER2: Negative) - Signed by Malachy Mood, MD on 04/25/2017 - Pathologic stage from 06/13/2017: Stage IIB (pT2, pN1a, cM0, G1, ER: Positive, PR: Negative, HER2: Negative) - Signed by Malachy Mood, MD on 09/04/2017     Malignant neoplasm of upper-inner quadrant of left breast in female, estrogen receptor positive (HCC)  04/10/2017 Mammogram   Left breast mammo and US showed an irregular hypoechoic shadowing mass at 9 o'clock 1 cm from the nipple measuring 2.3 x 1 x 2.4 cm. This corresponds well with the mass seen at mammography. No lymphadenopathy seen in the left axilla.   04/18/2017 Initial Diagnosis   Malignant neoplasm of upper-inner quadrant of left breast in female, estrogen receptor positive (HCC)   04/22/2017 Receptors her2   Estrogen Receptor: 95%, POSITIVE, STRONG STAINING INTENSITY Progesterone Receptor: 0%, NEGATIVE Proliferation Marker Ki67: 10% HER2 -   04/22/2017 Initial Biopsy   Breast, left, needle core biopsy, 9:30 o'clock - INVASIVE DUCTAL CARCINOMA, G2 - DUCTAL CARCINOMA IN SITU.   06/13/2017 Surgery   Left breast mastectomy with sentinel lymph node biopsy performed by Dr Carolynne Edouard.    06/13/2017 Pathology Results   Breast, simple mastectomy, Left - INVASIVE DUCTAL CARCINOMA,  GRADE I/III, SPANNING 2.5 CM. - DUCTAL CARCINOMA IN SITU, INTERMEDIATE GRADE. - LOBULAR NEOPLASIA (ATYPICAL LOBULAR HYPERPLASIA). - THE SURGICAL RESECTION MARGINS ARE NEGATIVE FOR CARCINOMA.  Lymph node, sentinel, biopsy, Left axillary #1 - METASTATIC CARCINOMA IN 1 OF 3 LYMPH NODE (1/3), WITH EXTRACAPSULAR EXTENSION.   06/13/2017 Pathology Results   Mammaprint with low risk disease.    07/31/2017 - 09/10/2017 Radiation Therapy   The patient began a course of radiotherapy over the left chest wall and axillary area, supervised by Dr Lonie Peak, MD.     Radiation treatment dates:   07/31/2017 - 09/10/2017   Site/dose:    1) Left Chest Wall / 50 Gy in 25 fractions 2) Left Supraclavicular / 45 Gy in 25 fractions 3) Left Chest Wall Scar Boost / 10 Gy in 5 fractions   Beams/energy:    1) Opposed tangents 3D/ 10 and 6 MV photons 2) Additional fields 3D / 10 MV and 6 MV photons 3) En face electrons / 6 MeV electrons   Narrative: The patient tolerated radiation treatment relatively well. She experienced mild fatigue. She reported pain, burning, and itching to the left breast and swelling under the left arm. On physical exam, she had dry peeling in the left UIQ of the breast and left axilla, as well as diffuse hyperpigmentation and erythema of the left chest wall. She is using the radiaplex cream to the treatment area and neosporin to the peeling areas.   10/13/2017 -  Anti-estrogen oral therapy   Tamoxifen 10mg  daily   04/11/2018 Mammogram   04/11/2018 Mammogram IMPRESSION: No mammographic evidence of malignancy. A result letter of this screening mammogram will  be mailed directly to the patient.   04/11/2018 Mammogram   Screening Unilateral Right: No evidence of mammographic malignancy.      CURRENT THERAPY: Adjuvant tamoxifen  INTERVAL HISTORY: She is here for f/u of her breast cancer.  She was last seen by me in Dec 2023.  She is accompanied by her husband to clinic today.  She  developed left shoulder pain in OCT 2023, see by orth, got injection in shoulder, much improved. She went to [place] in April 2024 to visit her family. Due to worsening left shoulder pain, she underwent CT and bone scan in [place]. Unfortunately scan showed multiple bone lesion and uptake in sternum, cervical spine, left scapular, left 10th rib, and left hip, concerning for bone mets. She underwent bone biopsy, per pt (I do not see the report) it was positive, and she come back to [place] for treatment. Her left shoulder pain is persistent, mild to moderate, she is not taking any pain meds. No other new complains   REVIEW OF SYSTEMS:   Constitutional: Denies fevers, chills or abnormal weight loss Eyes: Denies blurriness of vision Ears, nose, mouth, throat, and face: Denies mucositis or sore throat Respiratory: Denies cough, dyspnea or wheezes Cardiovascular: Denies palpitation, chest discomfort or lower extremity swelling Gastrointestinal:  Denies nausea, heartburn or change in bowel habits Skin: Denies abnormal skin rashes Lymphatics: Denies new lymphadenopathy or easy bruising Neurological:Denies numbness, tingling or new weaknesses Behavioral/Psych: Mood is stable, no new changes  All other systems were reviewed with the patient and are negative.  MEDICAL HISTORY:  Past Medical History:  Diagnosis Date   Breast mass    lt breast mass ? doesnt know how long   Headache    History of radiation therapy 07/31/17- 09/10/17   LEft chest wass 50 gy in 25 fractions, Left Supraclavicular 45 Gy in 25 fractions, Left Chest wall scar boost 10 Gy in 5 fractions.    Malignant neoplasm of upper-inner quadrant of left breast in female, estrogen receptor positive (HCC) 04/18/2017   Personal history of radiation therapy    UTI (urinary tract infection)     SURGICAL HISTORY: Past Surgical History:  Procedure Laterality Date   APPENDECTOMY     COLONOSCOPY     GANGLION CYST EXCISION Right 09/24/2017   Procedure:  EXCISION OF RIGHT DORSAL WRIST GANGLION;  Surgeon: Mack Hook, MD;  Location: [facility];  Service: Orthopedics;  Laterality: Right;   LIPOMA EXCISION Left 06/13/2017   Procedure: EXCISION 3CM LIPOMA ON LEFT ARM;  Surgeon: Griselda Miner, MD;  Location: [facility];  Service: General;  Laterality: Left;   MASTECTOMY Left 2018   MASTECTOMY W/ SENTINEL NODE BIOPSY Left 06/13/2017   Procedure: MASTECTOMY WITH SENTINEL LYMPH NODE BIOPSY;  Surgeon: Griselda Miner, MD;  Location: [facility];  Service: General;  Laterality: Left;    I have reviewed the social history and family history with the patient and they are unchanged from previous note.  ALLERGIES:  has No Known Allergies.  MEDICATIONS:  Current Outpatient Medications  Medication Sig Dispense Refill   acetaminophen (TYLENOL) 325 MG tablet Take 2 tablets (650 mg total) every 6 (six) hours by mouth.     tamoxifen (NOLVADEX) 20 MG tablet Take 1 tablet (20 mg total) by mouth daily. 120 tablet 0   No current facility-administered medications for this visit.    PHYSICAL EXAMINATION: ECOG PERFORMANCE STATUS: 0 - Asymptomatic  Vitals:   06/01/23 0913  BP: 118/66  Pulse: 60  Resp: 18  Temp: 97.7 F (36.5 C)  SpO2: 99%   Filed Weights   06/01/23 0913  Weight: 125 lb 12.8 oz (57.1 kg)    GENERAL:alert, no distress and comfortable SKIN: skin color, texture, turgor are normal, no rashes or significant lesions EYES: normal, Conjunctiva are pink and non-injected, sclera clear OROPHARYNX:no exudate, no erythema and lips, buccal mucosa, and tongue normal  NECK: supple, thyroid normal size, non-tender, without nodularity LYMPH:  no palpable lymphadenopathy in the cervical, axillary or inguinal LUNGS: clear to auscultation and percussion with normal breathing effort HEART: regular rate & rhythm and no murmurs and no lower extremity edema ABDOMEN:abdomen soft, non-tender and normal bowel sounds Musculoskeletal:no cyanosis of digits  and no clubbing  NEURO: alert & oriented x 3 with fluent speech, no focal motor/sensory deficits  LABORATORY DATA:  I have reviewed the data as listed    Latest Ref Rng & Units 06/01/2023    7:55 AM 10/30/2022    8:45 AM 01/30/2022    8:11 AM  CBC  WBC 4.0 - 10.5 K/uL 5.7  4.9  5.0   Hemoglobin 12.0 - 15.0 g/dL 16.1  09.6  04.5   Hematocrit 36.0 - 46.0 % 35.7  37.0  35.2   Platelets 150 - 400 K/uL 220  242  229         Latest Ref Rng & Units 06/01/2023    7:55 AM 10/30/2022    8:45 AM 01/30/2022    8:11 AM  CMP  Glucose 70 - 99 mg/dL 83  409  811   BUN 6 - 20 mg/dL 11  12  12    Creatinine 0.44 - 1.00 mg/dL 9.14  7.82  9.56   Sodium 135 - 145 mmol/L 143  141  140   Potassium 3.5 - 5.1 mmol/L 4.0  4.0  3.9   Chloride 98 - 111 mmol/L 108  107  108   CO2 22 - 32 mmol/L 29  29  25    Calcium 8.9 - 10.3 mg/dL 9.2  9.3  9.0   Total Protein 6.5 - 8.1 g/dL 6.6  6.3  6.8   Total Bilirubin 0.3 - 1.2 mg/dL 0.6  0.3  0.4   Alkaline Phos 38 - 126 U/L 52  48  40   AST 15 - 41 U/L 14  14  13    ALT 0 - 44 U/L 12  10  10        RADIOGRAPHIC STUDIES: I have personally reviewed the radiological images as listed and agreed with the findings in the report. No results found.   ASSESSMENT & PLAN:  No problem-specific Assessment & Plan notes found for this encounter.   Malignant neoplasm of upper-inner quadrant of left breast in female, estrogen receptor positive (HCC) [name], stage IIB, ER: Positive, PR: Negative, HER2: Negative, Grade 3 -She was diagnosed in 04/2017. She is s/p left breast mastectomy and adjuvant radiation. Mammaprint showed low risk. -She started antiestrogen therapy with Tamoxifen in 10/2017. She is tolerating well, plan for 10 year. She has not had a menstrual period since 2018.  Her FSH in March 2021 showed that she is still premenopausal -Due to worsening left shoulder pain, she underwent CT and bone scan in [place]. Unfortunately scan showed multiple bone lesion and  uptake in sternum, cervical spine, left scapular, left 10th rib, and left hip, concerning for bone mets. She underwent bone biopsy, per pt (I do not see the report) it was positive for malignant cells  -  will obtain PET scan for further evaluation  -she will send me the biopsy report  -I discussed the option of switching her to AI, along with ovarian suppression if she is still premenopausal (I encouraged her to see her GYN, and I will talk to them) and Verzenio  -f/u after PET      Plan -stop Tamoxifen  -will get PET in next few weeks -will talk to her gyn and check her FSH and estradiol level  -f/u in 2 weeks, after PET    Orders Placed This Encounter  Procedures   NM PET Image Initial (PI) Skull Base To Thigh    Standing Status:   Future    Standing Expiration Date:   05/31/2024    Order Specific Question:   If indicated for the ordered procedure, I authorize the administration of a radiopharmaceutical per Radiology protocol    Answer:   Yes    Order Specific Question:   Is the patient pregnant?    Answer:   No    Order Specific Question:   Preferred imaging location?    Answer:   [facility]   All questions were answered. The patient knows to call the clinic with any problems, questions or concerns. No barriers to learning was detected. I spent 40 minutes counseling the patient face to face. The total time spent in the appointment was 30 minutes and more than 50% was on counseling and review of test results     Malachy Mood, MD 06/01/23

## 2023-06-07 ENCOUNTER — Other Ambulatory Visit: Payer: Self-pay

## 2023-06-08 DIAGNOSIS — C7981 Secondary malignant neoplasm of breast: Secondary | ICD-10-CM | POA: Diagnosis not present

## 2023-06-15 ENCOUNTER — Other Ambulatory Visit: Payer: Self-pay

## 2023-06-19 ENCOUNTER — Ambulatory Visit (HOSPITAL_COMMUNITY)
Admission: RE | Admit: 2023-06-19 | Discharge: 2023-06-19 | Disposition: A | Discharge status: Home / Self Care | Payer: BC Managed Care – PPO | Source: Ambulatory Visit | Attending: Hematology | Admitting: Hematology

## 2023-06-19 DIAGNOSIS — C50212 Malignant neoplasm of upper-inner quadrant of left female breast: Secondary | ICD-10-CM | POA: Diagnosis not present

## 2023-06-19 DIAGNOSIS — Z170 Estrogen receptor positive status [ER+]: Secondary | ICD-10-CM

## 2023-06-19 DIAGNOSIS — C7951 Secondary malignant neoplasm of bone: Secondary | ICD-10-CM | POA: Diagnosis not present

## 2023-06-19 LAB — GLUCOSE, CAPILLARY: Glucose-Capillary: 90 mg/dL (ref 70–99)

## 2023-06-19 MED ORDER — FLUDEOXYGLUCOSE F - 18 (FDG) INJECTION
6.3000 | Freq: Once | INTRAVENOUS | Status: AC
  Administered 2023-06-19: 6.23 via INTRAVENOUS

## 2023-06-22 ENCOUNTER — Other Ambulatory Visit: Payer: Self-pay

## 2023-06-22 ENCOUNTER — Telehealth: Payer: Self-pay | Admitting: Hematology

## 2023-06-24 DIAGNOSIS — C7951 Secondary malignant neoplasm of bone: Secondary | ICD-10-CM | POA: Insufficient documentation

## 2023-06-24 NOTE — Assessment & Plan Note (Signed)
-  She unfortunately developed metastatic breast cancer to multiple sites of her bones in June 2024 -I recommend her to start a calcium and vitamin D supplement -I discussed the benefit and the potential side effect of biphosphonate Zometa, especially risk of jaw necrosis and infusion reactions.  She voiced good understanding and agrees to proceed.  She will get her dental clearance, plan to give her every 3 months.

## 2023-06-24 NOTE — Assessment & Plan Note (Signed)
[name], stage IIB, ER: Positive, PR: Negative, HER2: Negative, Grade 3 -She was diagnosed in 04/2017. She is s/p left breast mastectomy and adjuvant radiation. Mammaprint showed low risk. -She started antiestrogen therapy with Tamoxifen in 10/2017. She is tolerating well, plan for 10 year. She has not had a menstrual period since 2018.  Her FSH in March 2021 showed that she is still premenopausal --Due to worsening left shoulder pain, she underwent CT and bone scan in [place] during her trip. Unfortunately scan showed multiple bone lesion and uptake in sternum, cervical spine, left scapular, left 10th rib, and left hip, concerning for bone mets. She underwent bone biopsy of left scapula and sternum, both confirmed metastatic breast cancer, ER positive. -PET scan from June 21, 2023 showed multifocal hypermetabolic osseous metastatic disease. No definite pathologic fracture or epidural tumor identified.  No visceral metastasis.  I personally reviewed with patient and her husband. -I recommend changing treatment to aromatase inhibitor and Verzenio.  I reviewed the potential side effect and management, especially diarrhea, neutropenia, risk for infection, fatigue, etc., she voiced good understanding and agrees to proceed.  -I have referred her back to her gynecologist, to check her menopause status again, and discussed BSO if needed.

## 2023-06-25 ENCOUNTER — Other Ambulatory Visit: Payer: Self-pay

## 2023-06-25 ENCOUNTER — Inpatient Hospital Stay: Payer: BC Managed Care – PPO | Attending: Hematology

## 2023-06-25 ENCOUNTER — Inpatient Hospital Stay (HOSPITAL_BASED_OUTPATIENT_CLINIC_OR_DEPARTMENT_OTHER): Payer: BC Managed Care – PPO | Admitting: Hematology

## 2023-06-25 ENCOUNTER — Encounter: Payer: Self-pay | Admitting: Hematology

## 2023-06-25 VITALS — BP 116/68 | HR 70 | Temp 98.4°F | Resp 16 | Ht 63.0 in | Wt 127.4 lb

## 2023-06-25 DIAGNOSIS — C50212 Malignant neoplasm of upper-inner quadrant of left female breast: Secondary | ICD-10-CM

## 2023-06-25 DIAGNOSIS — C7951 Secondary malignant neoplasm of bone: Secondary | ICD-10-CM | POA: Diagnosis not present

## 2023-06-25 DIAGNOSIS — Z9012 Acquired absence of left breast and nipple: Secondary | ICD-10-CM | POA: Diagnosis not present

## 2023-06-25 DIAGNOSIS — Z170 Estrogen receptor positive status [ER+]: Secondary | ICD-10-CM

## 2023-06-25 DIAGNOSIS — Z79899 Other long term (current) drug therapy: Secondary | ICD-10-CM | POA: Insufficient documentation

## 2023-06-25 LAB — CMP (CANCER CENTER ONLY)
ALT: 11 U/L (ref 0–44)
AST: 14 U/L — ABNORMAL LOW (ref 15–41)
Albumin: 3.9 g/dL (ref 3.5–5.0)
Alkaline Phosphatase: 47 U/L (ref 38–126)
Anion gap: 6 (ref 5–15)
BUN: 12 mg/dL (ref 6–20)
CO2: 29 mmol/L (ref 22–32)
Calcium: 8.8 mg/dL — ABNORMAL LOW (ref 8.9–10.3)
Chloride: 108 mmol/L (ref 98–111)
Creatinine: 0.62 mg/dL (ref 0.44–1.00)
GFR, Estimated: >60 mL/min (ref >60)
Glucose, Bld: 113 mg/dL — ABNORMAL HIGH (ref 70–99)
Potassium: 3.6 mmol/L (ref 3.5–5.1)
Sodium: 143 mmol/L (ref 135–145)
Total Bilirubin: 0.5 mg/dL (ref 0.3–1.2)
Total Protein: 6.4 g/dL — ABNORMAL LOW (ref 6.5–8.1)

## 2023-06-25 LAB — CBC WITH DIFFERENTIAL (CANCER CENTER ONLY)
Abs Immature Granulocytes: 0.01 10*3/uL (ref 0.00–0.07)
Basophils Absolute: 0 10*3/uL (ref 0.0–0.1)
Basophils Relative: 0 %
Eosinophils Absolute: 0.1 10*3/uL (ref 0.0–0.5)
Eosinophils Relative: 2 %
HCT: 35.1 % — ABNORMAL LOW (ref 36.0–46.0)
Hemoglobin: 11.7 g/dL — ABNORMAL LOW (ref 12.0–15.0)
Immature Granulocytes: 0 %
Lymphocytes Relative: 36 %
Lymphs Abs: 1.6 10*3/uL (ref 0.7–4.0)
MCH: 30.5 pg (ref 26.0–34.0)
MCHC: 33.3 g/dL (ref 30.0–36.0)
MCV: 91.4 fL (ref 80.0–100.0)
Monocytes Absolute: 0.4 10*3/uL (ref 0.1–1.0)
Monocytes Relative: 9 %
Neutro Abs: 2.4 10*3/uL (ref 1.7–7.7)
Neutrophils Relative %: 53 %
Platelet Count: 224 10*3/uL (ref 150–400)
RBC: 3.84 MIL/uL — ABNORMAL LOW (ref 3.87–5.11)
RDW: 13.5 % (ref 11.5–15.5)
WBC Count: 4.5 10*3/uL (ref 4.0–10.5)
nRBC: 0 % (ref 0.0–0.2)

## 2023-06-25 MED ORDER — HYDROCODONE-ACETAMINOPHEN 5-325 MG PO TABS: 1.0000 | ORAL_TABLET | Freq: Four times a day (QID) | ORAL | 0 refills | Status: DC | PRN

## 2023-06-25 NOTE — Progress Notes (Signed)
[facility]   Telephone:([phone]) [phone] Fax:(336) [phone]   Clinic Follow up Note   Patient Care Team: Patient, No Pcp Per as PCP - General (General Practice) Malachy Mood, MD as Consulting Physician (Hematology) Griselda Miner, MD as Consulting Physician (General Surgery) Lonie Peak, MD as Attending Physician (Radiation Oncology)  Date of Service:  06/25/2023  CHIEF COMPLAINT: follow up breast cancer     CURRENT THERAPY:  pending letrozole and Verzenio   ASSESSMENT:  Kristina Nicholson is a 57 y.o. female with   Malignant neoplasm of upper-inner quadrant of left breast in female, estrogen receptor positive (HCC) [name], stage IIB, ER: Positive, PR: Negative, HER2: Negative, Grade 3 -She was diagnosed in 04/2017. She is s/p left breast mastectomy and adjuvant radiation. Mammaprint showed low risk. -She started antiestrogen therapy with Tamoxifen in 10/2017. She is tolerating well, plan for 10 year. She has not had a menstrual period since 2018.  Her FSH in March 2021 showed that she is still premenopausal --Due to worsening left shoulder pain, she underwent CT and bone scan in [place] during her trip. Unfortunately scan showed multiple bone lesion and uptake in sternum, cervical spine, left scapular, left 10th rib, and left hip, concerning for bone mets. She underwent bone biopsy of left scapula and sternum, both confirmed metastatic breast cancer, ER positive (weak to moderate in left scapula biopsy, and 80% strong positive in sternal biopsy), PR weak to moderate positive, HER2 2+ in left scapular, 1+ in sternal bone biopsy -PET scan from June 21, 2023 showed multifocal hypermetabolic osseous metastatic disease. No definite pathologic fracture or epidural tumor identified.  No visceral metastasis.  I personally reviewed with patient and her husband. -We discussed that her metastatic breast cancer is not curable at this stage, he will require long-term treatment, and it will  shorten her life.  The goal of therapy is palliative, to prolong her life and improve her cancer related symptoms.  Patient and her husband voiced good understanding. -I recommend changing treatment to aromatase inhibitor and Verzenio.  I reviewed the potential side effect and management, especially diarrhea, neutropenia, risk for infection, fatigue, etc., she voiced good understanding and agrees to proceed.  -I have referred her back to her gynecologist, to check her menopause status again, and discussed BSO if needed. She has appointment with her GYN tomorrow    Metastasis to bone [facility]) -She unfortunately developed metastatic breast cancer to multiple sites of her bones in June 2024 -I recommend her to start a calcium and vitamin D supplement -I discussed the benefit and the potential side effect of biphosphonate Zometa, especially risk of jaw necrosis and infusion reactions.  She voiced good understanding and agrees to proceed.  She does not have a dentist and we will watch closely. plan to give her every 3 months for 2 years. -Due to her significant left shoulder pain, we discussed the role of palliative radiation, she is interested.  However she has high co-pay, so she wants to hold it for now. -I also called in hydrocodone for her left shoulder pain.   PLAN: - reviewed PET scan results - reviewed treatment plan with patient - messaged social worker for patient due to her concern of financial burden from cancer treatment, he has high co-pay and deductible -She has appointment with her gynecologist tomorrow, if she is postmenopausal, I will call in letrozole and Verzenio for her. -I called in hydrocodone  Addendum I spoke with her GYN this morning and her  lab test showed she is still peri-menopausal. BSO was offered, and pt agreed. But due to OR schedule, her BSO will likely be done in 4-6 weeks. I called pt and discussed alternative option of fulvestrant injection and verzenio, Benefit and  side effects discussed with her and her husband, will get her scheduled for next week.   Malachy Mood  06/26/2023   SUMMARY OF ONCOLOGIC HISTORY: Oncology History Overview Note  Cancer Staging Malignant neoplasm of upper-inner quadrant of left breast in female, estrogen receptor positive (HCC) Staging form: Breast, AJCC 8th Edition - Clinical stage from 04/13/2017: Stage IIA (cT2, cN0, cM0, G2, ER: Positive, PR: Negative, HER2: Negative) - Signed by Malachy Mood, MD on 04/25/2017 - Pathologic stage from 06/13/2017: Stage IIB (pT2, pN1a, cM0, G1, ER: Positive, PR: Negative, HER2: Negative) - Signed by Malachy Mood, MD on 09/04/2017     Malignant neoplasm of upper-inner quadrant of left breast in female, estrogen receptor positive (HCC)  04/10/2017 Mammogram   Left breast mammo and US showed an irregular hypoechoic shadowing mass at 9 o'clock 1 cm from the nipple measuring 2.3 x 1 x 2.4 cm. This corresponds well with the mass seen at mammography. No lymphadenopathy seen in the left axilla.   04/18/2017 Initial Diagnosis   Malignant neoplasm of upper-inner quadrant of left breast in female, estrogen receptor positive (HCC)   04/22/2017 Receptors her2   Estrogen Receptor: 95%, POSITIVE, STRONG STAINING INTENSITY Progesterone Receptor: 0%, NEGATIVE Proliferation Marker Ki67: 10% HER2 -   04/22/2017 Initial Biopsy   Breast, left, needle core biopsy, 9:30 o'clock - INVASIVE DUCTAL CARCINOMA, G2 - DUCTAL CARCINOMA IN SITU.   06/13/2017 Surgery   Left breast mastectomy with sentinel lymph node biopsy performed by Dr Carolynne Edouard.    06/13/2017 Pathology Results   Breast, simple mastectomy, Left - INVASIVE DUCTAL CARCINOMA, GRADE I/III, SPANNING 2.5 CM. - DUCTAL CARCINOMA IN SITU, INTERMEDIATE GRADE. - LOBULAR NEOPLASIA (ATYPICAL LOBULAR HYPERPLASIA). - THE SURGICAL RESECTION MARGINS ARE NEGATIVE FOR CARCINOMA.  Lymph node, sentinel, biopsy, Left axillary #1 - METASTATIC CARCINOMA IN 1 OF 3 LYMPH NODE (1/3), WITH  EXTRACAPSULAR EXTENSION.   06/13/2017 Pathology Results   Mammaprint with low risk disease.    07/31/2017 - 09/10/2017 Radiation Therapy   The patient began a course of radiotherapy over the left chest wall and axillary area, supervised by Dr Lonie Peak, MD.     Radiation treatment dates:   07/31/2017 - 09/10/2017   Site/dose:    1) Left Chest Wall / 50 Gy in 25 fractions 2) Left Supraclavicular / 45 Gy in 25 fractions 3) Left Chest Wall Scar Boost / 10 Gy in 5 fractions   Beams/energy:    1) Opposed tangents 3D/ 10 and 6 MV photons 2) Additional fields 3D / 10 MV and 6 MV photons 3) En face electrons / 6 MeV electrons   Narrative: The patient tolerated radiation treatment relatively well. She experienced mild fatigue. She reported pain, burning, and itching to the left breast and swelling under the left arm. On physical exam, she had dry peeling in the left UIQ of the breast and left axilla, as well as diffuse hyperpigmentation and erythema of the left chest wall. She is using the radiaplex cream to the treatment area and neosporin to the peeling areas.   10/13/2017 -  Anti-estrogen oral therapy   Tamoxifen 10mg  daily   04/11/2018 Mammogram   04/11/2018 Mammogram IMPRESSION: No mammographic evidence of malignancy. A result letter of this screening mammogram will be  mailed directly to the patient.   04/11/2018 Mammogram   Screening Unilateral Right: No evidence of mammographic malignancy.       INTERVAL HISTORY:  Yittel Mayah Schrader is here for a follow up of breast cancer.  She was last seen by me 06/01/2023.  She is accompanied by her husband to clinic today.  She still has moderate left shoulder pain, sometime pretty significant, she took 3 tablets of ibuprofen last night.  No other new complaints.   All other systems were reviewed with the patient and are negative.  MEDICAL HISTORY:  Past Medical History:  Diagnosis Date   Breast mass    lt breast mass ? doesnt know how  long   Headache    History of radiation therapy 07/31/17- 09/10/17   LEft chest wass 50 gy in 25 fractions, Left Supraclavicular 45 Gy in 25 fractions, Left Chest wall scar boost 10 Gy in 5 fractions.    Malignant neoplasm of upper-inner quadrant of left breast in female, estrogen receptor positive (HCC) 04/18/2017   Personal history of radiation therapy    UTI (urinary tract infection)     SURGICAL HISTORY: Past Surgical History:  Procedure Laterality Date   APPENDECTOMY     COLONOSCOPY     GANGLION CYST EXCISION Right 09/24/2017   Procedure: EXCISION OF RIGHT DORSAL WRIST GANGLION;  Surgeon: Mack Hook, MD;  Location: [facility];  Service: Orthopedics;  Laterality: Right;   LIPOMA EXCISION Left 06/13/2017   Procedure: EXCISION 3CM LIPOMA ON LEFT ARM;  Surgeon: Griselda Miner, MD;  Location: [facility] OR;  Service: General;  Laterality: Left;   MASTECTOMY Left 2018   MASTECTOMY W/ SENTINEL NODE BIOPSY Left 06/13/2017   Procedure: MASTECTOMY WITH SENTINEL LYMPH NODE BIOPSY;  Surgeon: Griselda Miner, MD;  Location: [facility] OR;  Service: General;  Laterality: Left;    I have reviewed the social history and family history with the patient and they are unchanged from previous note.  ALLERGIES:  has No Known Allergies.  MEDICATIONS:  Current Outpatient Medications  Medication Sig Dispense Refill   HYDROcodone-acetaminophen (NORCO/VICODIN) 5-325 MG tablet Take 1 tablet by mouth every 6 (six) hours as needed for moderate pain. 20 tablet 0   acetaminophen (TYLENOL) 325 MG tablet Take 2 tablets (650 mg total) every 6 (six) hours by mouth.     tamoxifen (NOLVADEX) 20 MG tablet Take 1 tablet (20 mg total) by mouth daily. 120 tablet 0   No current facility-administered medications for this visit.    PHYSICAL EXAMINATION: ECOG PERFORMANCE STATUS: 1 - Symptomatic but completely ambulatory  Vitals:   06/25/23 1002  BP: 116/68  Pulse: 70  Resp: 16  Temp: 98.4 F (36.9 C)  SpO2: 98%    Wt Readings from Last 3 Encounters:  06/25/23 127 lb 6.4 oz (57.8 kg)  06/01/23 125 lb 12.8 oz (57.1 kg)  10/30/22 128 lb 14.4 oz (58.5 kg)     GENERAL:alert, no distress and comfortable SKIN: skin color, texture, turgor are normal, no rashes or significant lesions EYES: normal, Conjunctiva are pink and non-injected, sclera clear NECK: supple, thyroid normal size, non-tender, without nodularity LYMPH:  no palpable lymphadenopathy in the cervical, axillary  LUNGS: clear to auscultation and percussion with normal breathing effort HEART: regular rate & rhythm and no murmurs and no lower extremity edema ABDOMEN:abdomen soft, non-tender and normal bowel sounds Musculoskeletal:no cyanosis of digits and no clubbing  NEURO: alert & oriented x 3 with fluent speech, no focal motor/sensory  deficits  LABORATORY DATA:  I have reviewed the data as listed    Latest Ref Rng & Units 06/25/2023    9:44 AM 06/01/2023    7:55 AM 10/30/2022    8:45 AM  CBC  WBC 4.0 - 10.5 K/uL 4.5  5.7  4.9   Hemoglobin 12.0 - 15.0 g/dL 81.1  91.4  78.2   Hematocrit 36.0 - 46.0 % 35.1  35.7  37.0   Platelets 150 - 400 K/uL 224  220  242         Latest Ref Rng & Units 06/25/2023    9:44 AM 06/01/2023    7:55 AM 10/30/2022    8:45 AM  CMP  Glucose 70 - 99 mg/dL 956  83  213   BUN 6 - 20 mg/dL 12  11  12    Creatinine 0.44 - 1.00 mg/dL 0.86  5.78  4.69   Sodium 135 - 145 mmol/L 143  143  141   Potassium 3.5 - 5.1 mmol/L 3.6  4.0  4.0   Chloride 98 - 111 mmol/L 108  108  107   CO2 22 - 32 mmol/L 29  29  29    Calcium 8.9 - 10.3 mg/dL 8.8  9.2  9.3   Total Protein 6.5 - 8.1 g/dL 6.4  6.6  6.3   Total Bilirubin 0.3 - 1.2 mg/dL 0.5  0.6  0.3   Alkaline Phos 38 - 126 U/L 47  52  48   AST 15 - 41 U/L 14  14  14    ALT 0 - 44 U/L 11  12  10        RADIOGRAPHIC STUDIES: I have personally reviewed the radiological images as listed and agreed with the findings in the report. No results found.    No orders of  the defined types were placed in this encounter.  All questions were answered. The patient knows to call the clinic with any problems, questions or concerns. No barriers to learning was detected. The total time spent in the appointment was 40 minutes.     Malachy Mood, MD 06/25/2023   I, Sharlette Dense, CMA, am acting as scribe for Malachy Mood, MD.   I have reviewed the above documentation for accuracy and completeness, and I agree with the above.

## 2023-06-26 ENCOUNTER — Telehealth: Payer: Self-pay | Admitting: Pharmacist

## 2023-06-26 ENCOUNTER — Telehealth: Payer: Self-pay | Admitting: Pharmacy Technician

## 2023-06-26 ENCOUNTER — Other Ambulatory Visit: Payer: Self-pay

## 2023-06-26 ENCOUNTER — Encounter: Payer: Self-pay | Admitting: Hematology

## 2023-06-26 ENCOUNTER — Other Ambulatory Visit (HOSPITAL_COMMUNITY): Payer: Self-pay

## 2023-06-26 ENCOUNTER — Inpatient Hospital Stay: Payer: BC Managed Care – PPO | Admitting: Licensed Clinical Social Worker

## 2023-06-26 DIAGNOSIS — C7981 Secondary malignant neoplasm of breast: Secondary | ICD-10-CM | POA: Diagnosis not present

## 2023-06-26 DIAGNOSIS — C50212 Malignant neoplasm of upper-inner quadrant of left female breast: Secondary | ICD-10-CM

## 2023-06-26 DIAGNOSIS — Z170 Estrogen receptor positive status [ER+]: Secondary | ICD-10-CM

## 2023-06-26 MED ORDER — ABEMACICLIB 150 MG PO TABS
150.0000 mg | ORAL_TABLET | Freq: Two times a day (BID) | ORAL | 0 refills | Status: DC
  Filled 2023-06-26: qty 56, 28d supply, fill #0

## 2023-06-26 NOTE — Progress Notes (Signed)
[facility] CSW Progress Note  Visual merchandiser  spoke with pt's husband over the phone regarding financial concerns.  Per pt's husband the insurance policy pt has through his work has a $10,000 deductible which has not yet been met and pt is being scheduled for a procedure.  CSW provided pt with the link for [employer] and contact number to register w/ [employer] for co-pay assistance.  Pt's husband also provided with the link for [employer] to complete an application for $400 in assistance.  The applications for [employer] and [employer] in [place] also attached to be completed.  Once returned to CSW the applications will be submitted on behalf of pt.  It is uncertain if pt may qualify for Medicaid, contact information for Medicaid outpost station given to inquire if pt may be eligible.  Pt's husband also provided w/ contact information for CSW for any additional questions.      Rachel Moulds, LCSW Clinical Social Worker [facility]

## 2023-06-26 NOTE — Telephone Encounter (Signed)
Oral Oncology Pharmacist Encounter  Received new prescription for Verzenio (abemaciclib) for the treatment of metastatic HR positive, HER-2 negative breast cancer in conjunction with fulvestrant, planned duration until disease progression or unacceptable drug toxicity.  CBC w/ Diff and CMP from 06/25/23 assessed, no relevant lab abnormalities requiring baseline dose adjustment required at this time. Prescription dose and frequency assessed for appropriateness.  Current medication list in Epic reviewed, no relevant/significant DDIs with Verzenio identified.  Evaluated chart and no patient barriers to medication adherence noted.   Patient agreement for treatment documented in MD note on 06/25/23.  Prescription has been e-scribed to the [facility] for benefits analysis and approval.  Oral Oncology Clinic will continue to follow for insurance authorization, copayment issues, initial counseling and start date.  Lenord Carbo, PharmD, BCPS, [facility] Hematology/Oncology Clinical Pharmacist [facility] and [facility] Oral Chemotherapy Navigation Clinics [phone] 06/26/2023 3:43 PM

## 2023-06-26 NOTE — Telephone Encounter (Signed)
Oral Oncology Patient Advocate Encounter   Received notification that prior authorization for Verzenio is required.   PA submitted on 06/26/23 Key [id number] Status is pending     Kristina Nicholson, CPhT-Adv Oncology Pharmacy Patient Advocate [facility] Direct Number: [phone]  Fax: [phone]

## 2023-06-26 NOTE — Addendum Note (Signed)
Addended by: Malachy Mood on: 06/26/2023 02:40 PM   Modules accepted: Orders

## 2023-06-27 ENCOUNTER — Other Ambulatory Visit (HOSPITAL_COMMUNITY): Payer: Self-pay

## 2023-06-27 ENCOUNTER — Other Ambulatory Visit: Payer: Self-pay

## 2023-06-27 ENCOUNTER — Telehealth: Payer: Self-pay | Admitting: Pharmacy Technician

## 2023-06-27 ENCOUNTER — Telehealth: Payer: Self-pay | Admitting: Hematology

## 2023-06-27 MED ORDER — ABEMACICLIB 150 MG PO TABS: 150.0000 mg | ORAL_TABLET | Freq: Two times a day (BID) | ORAL | 0 refills | Status: DC

## 2023-06-27 NOTE — Telephone Encounter (Signed)
Oral Oncology Patient Advocate Encounter  Prior Authorization for Kathlen Mody has been approved.    PA# [id number] Effective dates: 06/27/23 through 06/26/24  Patient must fill a 14 day supply for first 4 fills. Patient is required to fill at [facility].    Jinger Neighbors, CPhT-Adv Oncology Pharmacy Patient Advocate [facility] Direct Number: [phone]  Fax: [phone]

## 2023-06-27 NOTE — Telephone Encounter (Signed)
Oral Oncology Patient Advocate Encounter   Was successful in obtaining a copay card for Verzenio.  This copay card will make the patients copay $0.  I have spoken with the patient.    The billing information is as follows and has been shared with [facility].   RxBin: [id number] PCN: [id number] Member ID: [id number] Group ID: [id number]   Jinger Neighbors, CPhT-Adv Oncology Pharmacy Patient Advocate [facility] Direct Number: [phone]  Fax: [phone]

## 2023-06-27 NOTE — Telephone Encounter (Signed)
Oral Oncology Pharmacist Encounter   Patient's insurance requires them to fill through [facility]. Verzenio prescription has been redirected for dispensing.    Oral Chemotherapy Clinic will follow up with outside pharmacy to ensure Rx is delivered.    Lenord Carbo, PharmD, BCPS, [facility] Hematology/Oncology Clinical Pharmacist [facility] and [facility] Oral Chemotherapy Navigation Clinics [phone] 06/27/2023 9:50 AM

## 2023-06-28 ENCOUNTER — Telehealth: Payer: Self-pay | Admitting: *Deleted

## 2023-06-28 ENCOUNTER — Other Ambulatory Visit: Payer: Self-pay

## 2023-06-28 NOTE — Telephone Encounter (Signed)
Connected with Porshe Beckey Rutter spouse Tinnie Gens [phone]).  "I came in yesterday and gave you the signed authoriztion.  Received a letter today from the company that deals with this needs the certificate of provider.  Requested extension because I know how things are.  I do not have a fax number, it should be on the letter or the form."   Advised form in progress however no information noted on Excel Forms Tracker.  This nurse working remotely today will investigate further tomorrow and return call.  Denies further questions or needs.  Voicemail forwarded to provider's CMA and Set designer.

## 2023-06-28 NOTE — Telephone Encounter (Signed)
Oral Chemotherapy Pharmacist Encounter   Attempted to reach patient's husband to provide update and offer for initial counseling on oral medication: Verzenio (abemaciclib).   No answer. Left voicemail for patient's husband to call back to discuss details of medication acquisition and initial counseling session.  Lenord Carbo, PharmD, BCPS, [facility] Hematology/Oncology Clinical Pharmacist [facility] and [facility] Oral Chemotherapy Navigation Clinics [phone] 06/28/2023 11:07 AM

## 2023-06-29 ENCOUNTER — Encounter: Payer: Self-pay | Admitting: Hematology

## 2023-06-29 NOTE — Telephone Encounter (Signed)
Received paperwork signed by provider today.  Successfully faxed to [employer].  E-mailed to [email or web address] as requested.  Spoke with Jenni Beckey Rutter spouse Tinnie Gens.  "We will pick up form when she comes in Monday for her first shot."  Envelope to [facility] file folder.  Form to H.I.M. bin designated for items to be scanned.  No further actions performed or required by this nurse.

## 2023-07-02 ENCOUNTER — Inpatient Hospital Stay: Payer: BC Managed Care – PPO

## 2023-07-02 ENCOUNTER — Other Ambulatory Visit: Payer: Self-pay

## 2023-07-02 VITALS — BP 129/70 | HR 74 | Temp 98.3°F | Resp 16

## 2023-07-02 DIAGNOSIS — Z9012 Acquired absence of left breast and nipple: Secondary | ICD-10-CM | POA: Diagnosis not present

## 2023-07-02 DIAGNOSIS — C50212 Malignant neoplasm of upper-inner quadrant of left female breast: Secondary | ICD-10-CM | POA: Diagnosis not present

## 2023-07-02 DIAGNOSIS — Z79899 Other long term (current) drug therapy: Secondary | ICD-10-CM | POA: Diagnosis not present

## 2023-07-02 DIAGNOSIS — Z170 Estrogen receptor positive status [ER+]: Secondary | ICD-10-CM | POA: Diagnosis not present

## 2023-07-02 DIAGNOSIS — C7951 Secondary malignant neoplasm of bone: Secondary | ICD-10-CM | POA: Diagnosis not present

## 2023-07-02 MED ORDER — FULVESTRANT 250 MG/5ML IM SOSY
500.0000 mg | PREFILLED_SYRINGE | Freq: Once | INTRAMUSCULAR | Status: AC
  Administered 2023-07-02: 500 mg via INTRAMUSCULAR
  Filled 2023-07-02: qty 10

## 2023-07-02 NOTE — Telephone Encounter (Signed)
Oral Chemotherapy Pharmacist Encounter  I spoke with patient's husband, Yiselle Oliverson, for overview of: Verzenio (abemaciclib) for the treatment of metastatic HR positive, HER-2 negative breast cancer in conjunction with fulvestrant, planned duration until disease progression or unacceptable drug toxicity.   Counseled on administration, dosing, side effects, monitoring, drug-food interactions, safe handling, storage, and disposal.  Patient will take Verzenio 150mg  tablets, 1 tablet by mouth twice daily without regard to food.  Patient's husband aware that patient needs to avoid grapefruit and grapefruit juice.  Verzenio start date: 07/03/23  Adverse effects include but are not limited to: diarrhea, fatigue, nausea, decreased blood counts. Diarrhea: Patient already has Imodium (loperamide) on hand if they experience diarrhea. Patient knows to alert the office of 4 or more loose stools above baseline.  Reviewed with patient's husband importance of keeping a medication schedule and plan for any missed doses. No barriers to medication adherence identified.  Medication reconciliation performed and medication/allergy list updated.  Patient's insurance requires that they only be able to fill 14 days supply for the first few fills. Encouraged husband to be proactive with [facility] on refills of Verzenio.   All questions answered.  Mr. Stokley voiced understanding and appreciation.   Medication education handout placed in mail for patient and patient's spouse. Patient's spouse knows to call the office with questions or concerns. Oral Chemotherapy Clinic phone number provided.   Lenord Carbo, PharmD, BCPS, [facility] Hematology/Oncology Clinical Pharmacist [facility] and [facility] Oral Chemotherapy Navigation Clinics [phone] 07/02/2023 1:56 PM

## 2023-07-04 ENCOUNTER — Inpatient Hospital Stay: Payer: BC Managed Care – PPO

## 2023-07-11 ENCOUNTER — Other Ambulatory Visit: Payer: Self-pay | Admitting: Hematology

## 2023-07-11 DIAGNOSIS — Z170 Estrogen receptor positive status [ER+]: Secondary | ICD-10-CM

## 2023-07-13 NOTE — Progress Notes (Unsigned)
Patient Care Team: Patient, No Pcp Per as PCP - General (General Practice) Malachy Mood, MD as Consulting Physician (Hematology) Griselda Miner, MD as Consulting Physician (General Surgery) Lonie Peak, MD as Attending Physician (Radiation Oncology)   CHIEF COMPLAINT: Follow up breast cancer   Oncology History Overview Note  Cancer Staging Malignant neoplasm of upper-inner quadrant of left breast in female, estrogen receptor positive (HCC) Staging form: Breast, AJCC 8th Edition - Clinical stage from 04/13/2017: Stage IIA (cT2, cN0, cM0, G2, ER: Positive, PR: Negative, HER2: Negative) - Signed by Malachy Mood, MD on 04/25/2017 - Pathologic stage from 06/13/2017: Stage IIB (pT2, pN1a, cM0, G1, ER: Positive, PR: Negative, HER2: Negative) - Signed by Malachy Mood, MD on 09/04/2017     Malignant neoplasm of upper-inner quadrant of left breast in female, estrogen receptor positive (HCC)  04/10/2017 Mammogram   Left breast mammo and US showed an irregular hypoechoic shadowing mass at 9 o'clock 1 cm from the nipple measuring 2.3 x 1 x 2.4 cm. This corresponds well with the mass seen at mammography. No lymphadenopathy seen in the left axilla.   04/18/2017 Initial Diagnosis   Malignant neoplasm of upper-inner quadrant of left breast in female, estrogen receptor positive (HCC)   04/22/2017 Receptors her2   Estrogen Receptor: 95%, POSITIVE, STRONG STAINING INTENSITY Progesterone Receptor: 0%, NEGATIVE Proliferation Marker Ki67: 10% HER2 -   04/22/2017 Initial Biopsy   Breast, left, needle core biopsy, 9:30 o'clock - INVASIVE DUCTAL CARCINOMA, G2 - DUCTAL CARCINOMA IN SITU.   06/13/2017 Surgery   Left breast mastectomy with sentinel lymph node biopsy performed by Dr Carolynne Edouard.    06/13/2017 Pathology Results   Breast, simple mastectomy, Left - INVASIVE DUCTAL CARCINOMA, GRADE I/III, SPANNING 2.5 CM. - DUCTAL CARCINOMA IN SITU, INTERMEDIATE GRADE. - LOBULAR NEOPLASIA (ATYPICAL LOBULAR HYPERPLASIA). - THE  SURGICAL RESECTION MARGINS ARE NEGATIVE FOR CARCINOMA.  Lymph node, sentinel, biopsy, Left axillary #1 - METASTATIC CARCINOMA IN 1 OF 3 LYMPH NODE (1/3), WITH EXTRACAPSULAR EXTENSION.   06/13/2017 Pathology Results   Mammaprint with low risk disease.    07/31/2017 - 09/10/2017 Radiation Therapy   The patient began a course of radiotherapy over the left chest wall and axillary area, supervised by Dr Lonie Peak, MD.     Radiation treatment dates:   07/31/2017 - 09/10/2017   Site/dose:    1) Left Chest Wall / 50 Gy in 25 fractions 2) Left Supraclavicular / 45 Gy in 25 fractions 3) Left Chest Wall Scar Boost / 10 Gy in 5 fractions   Beams/energy:    1) Opposed tangents 3D/ 10 and 6 MV photons 2) Additional fields 3D / 10 MV and 6 MV photons 3) En face electrons / 6 MeV electrons   Narrative: The patient tolerated radiation treatment relatively well. She experienced mild fatigue. She reported pain, burning, and itching to the left breast and swelling under the left arm. On physical exam, she had dry peeling in the left UIQ of the breast and left axilla, as well as diffuse hyperpigmentation and erythema of the left chest wall. She is using the radiaplex cream to the treatment area and neosporin to the peeling areas.   10/13/2017 -  Anti-estrogen oral therapy   Tamoxifen 10mg  daily   04/11/2018 Mammogram   04/11/2018 Mammogram IMPRESSION: No mammographic evidence of malignancy. A result letter of this screening mammogram will be mailed directly to the patient.   04/11/2018 Mammogram   Screening Unilateral Right: No evidence of mammographic malignancy.  CURRENT THERAPY: Letrozole and Zoladex plus Verzenio, starting 06/25/23   INTERVAL HISTORY Ms. Walkenhorst returns for follow up as scheduled. Last seen by Dr. Mosetta Putt 06/25/23, received Zoladex. She started verzenio 150 mg BID ~8/20.  By day 2 she was feeling very tired and started having diarrhea, up to 6-7 episodes.  Regular Imodium was  not helpful, but started Imodium "multi symptom relief" which has been more effective and she felt better in the past 2 or 3 days.  She is scared to eat or drink much.  She has not been able to go back to work due to fatigue, diarrhea, and gas pain.  One day she called her husband home from work and was diaphoretic, he thought she was dehydrated, but no fever or chills.  She has been at home resting more. Wants to know if she can take tumeric and ginger. Continues to have left shoulder pain, she took 1 Norco and made her feel too "druggie"  and has not taken anything else.  ROS  All other systems reviewed and negative  Past Medical History:  Diagnosis Date   Breast mass    lt breast mass ? doesnt know how long   Headache    History of radiation therapy 07/31/17- 09/10/17   LEft chest wass 50 gy in 25 fractions, Left Supraclavicular 45 Gy in 25 fractions, Left Chest wall scar boost 10 Gy in 5 fractions.    Malignant neoplasm of upper-inner quadrant of left breast in female, estrogen receptor positive (HCC) 04/18/2017   Personal history of radiation therapy    UTI (urinary tract infection)      Past Surgical History:  Procedure Laterality Date   APPENDECTOMY     COLONOSCOPY     GANGLION CYST EXCISION Right 09/24/2017   Procedure: EXCISION OF RIGHT DORSAL WRIST GANGLION;  Surgeon: Mack Hook, MD;  Location: [facility];  Service: Orthopedics;  Laterality: Right;   LIPOMA EXCISION Left 06/13/2017   Procedure: EXCISION 3CM LIPOMA ON LEFT ARM;  Surgeon: Griselda Miner, MD;  Location: [facility] OR;  Service: General;  Laterality: Left;   MASTECTOMY Left 2018   MASTECTOMY W/ SENTINEL NODE BIOPSY Left 06/13/2017   Procedure: MASTECTOMY WITH SENTINEL LYMPH NODE BIOPSY;  Surgeon: Griselda Miner, MD;  Location: [facility] OR;  Service: General;  Laterality: Left;     Outpatient Encounter Medications as of 07/17/2023  Medication Sig   acetaminophen (TYLENOL) 325 MG tablet Take 2 tablets (650 mg total)  every 6 (six) hours by mouth.   traMADol (ULTRAM) 50 MG tablet Take 1 tablet (50 mg total) by mouth every 8 (eight) hours as needed.   [DISCONTINUED] abemaciclib (VERZENIO) 150 MG tablet TAKE 1 TABLET TWICE DAILY   abemaciclib (VERZENIO) 100 MG tablet Take 1 tablet (100 mg total) by mouth 2 (two) times daily.   [DISCONTINUED] HYDROcodone-acetaminophen (NORCO/VICODIN) 5-325 MG tablet Take 1 tablet by mouth every 6 (six) hours as needed for moderate pain. (Patient not taking: Reported on 07/17/2023)   [DISCONTINUED] tamoxifen (NOLVADEX) 20 MG tablet Take 1 tablet (20 mg total) by mouth daily. (Patient not taking: Reported on 07/17/2023)   No facility-administered encounter medications on file as of 07/17/2023.     Today's Vitals   07/17/23 0902  BP: (!) 110/56  Pulse: 78  Resp: 16  Temp: 97.6 F (36.4 C)  TempSrc: Oral  SpO2: 100%  Weight: 124 lb 7 oz (56.4 kg)  Height: 5\' 3"  (1.6 m)   Body mass index is  22.04 kg/m.   PHYSICAL EXAM GENERAL:alert, no distress and comfortable SKIN: no rash  EYES: sclera clear NECK: without mass LYMPH:  no palpable cervical or supraclavicular lymphadenopathy  LUNGS: clear with normal breathing effort HEART: regular rate & rhythm, no lower extremity edema ABDOMEN: abdomen soft, non-tender and normal bowel sounds NEURO: alert & oriented x 3 with fluent speech, no focal motor/sensory deficits MSK: ttp left scapula and shoulder  CBC    Component Value Date/Time   WBC 3.8 (L) 07/17/2023 0854   WBC 3.9 11/09/2017 0900   WBC 5.5 06/11/2017 1300   RBC 3.86 (L) 07/17/2023 0854   HGB 12.0 07/17/2023 0854   HGB 12.6 11/09/2017 0900   HCT 35.5 (L) 07/17/2023 0854   HCT 38.0 11/09/2017 0900   PLT 126 (L) 07/17/2023 0854   PLT 203 11/09/2017 0900   MCV 92.0 07/17/2023 0854   MCV 93.6 11/09/2017 0900   MCH 31.1 07/17/2023 0854   MCHC 33.8 07/17/2023 0854   RDW 12.6 07/17/2023 0854   RDW 12.8 11/09/2017 0900   LYMPHSABS 1.7 07/17/2023 0854   LYMPHSABS  1.4 11/09/2017 0900   MONOABS 0.1 07/17/2023 0854   MONOABS 0.4 11/09/2017 0900   EOSABS 0.1 07/17/2023 0854   EOSABS 0.1 11/09/2017 0900   BASOSABS 0.0 07/17/2023 0854   BASOSABS 0.0 11/09/2017 0900     CMP     Component Value Date/Time   NA 139 07/17/2023 0854   NA 141 11/09/2017 0900   K 3.7 07/17/2023 0854   K 4.5 11/09/2017 0900   CL 106 07/17/2023 0854   CO2 27 07/17/2023 0854   CO2 28 11/09/2017 0900   GLUCOSE 105 (H) 07/17/2023 0854   GLUCOSE 98 11/09/2017 0900   BUN 11 07/17/2023 0854   BUN 11.9 11/09/2017 0900   CREATININE 0.86 07/17/2023 0854   CREATININE 0.8 11/09/2017 0900   CALCIUM 9.0 07/17/2023 0854   CALCIUM 9.1 11/09/2017 0900   PROT 6.6 07/17/2023 0854   PROT 6.8 11/09/2017 0900   ALBUMIN 4.0 07/17/2023 0854   ALBUMIN 3.8 11/09/2017 0900   AST 13 (L) 07/17/2023 0854   AST 14 11/09/2017 0900   ALT 10 07/17/2023 0854   ALT 10 11/09/2017 0900   ALKPHOS 52 07/17/2023 0854   ALKPHOS 57 11/09/2017 0900   BILITOT 0.4 07/17/2023 0854   BILITOT 0.64 11/09/2017 0900   GFRNONAA >60 07/17/2023 0854   GFRAA >60 08/02/2020 0800     ASSESSMENT & PLAN:Kristina Nicholson is a 57 y.o. female with    Malignant neoplasm of upper-inner quadrant of left breast in female, estrogen receptor positive (HCC) [name], stage IIB, ER: Positive, PR: Negative, HER2: Negative, Grade 3 -Diagnosed in 04/2017. S/p left breast mastectomy and adjuvant radiation. Mammaprint showed low risk. -She started antiestrogen therapy with Tamoxifen in 10/2017, tolerated well, goal 10 years. She has not had a menstrual period since 2018.   -Her FSH in March 2021 showed that she is still premenopausal -Due to worsening left shoulder pain, she underwent CT and bone scan in [place] during her trip. Unfortunately scan showed multiple bone lesion and uptake in sternum, cervical spine, left scapular, left 10th rib, and left hip, concerning for bone mets. She underwent bone biopsy of left scapula and  sternum, both confirmed metastatic breast cancer, ER positive (weak to moderate in left scapula biopsy, and 80% strong positive in sternal biopsy), PR weak to moderate positive, HER2 2+ in left scapular, 1+ in sternal bone biopsy -PET scan 06/21/2023 showed  multifocal hypermetabolic osseous metastatic disease. No definite pathologic fracture or epidural tumor identified.  No visceral metastasis.  -She understands this is not curable at this stage, treatment goal is palliative -She began ovarian suppression 8/12. BSO is planned for October -Ms. Belair appears stable. Began Verzenio 150 mg BID on 8/20, not tolerating well due to fatigue, gas pain, and diarrhea. She is not able to work but able to maintain po for the most part. Imodium was not helpful but imodium "multi symptom relief" has been more effective. -Despite feeling better for the past few days she prefers to lower the dose. She will skip tonight then start Verzenio 100 mg BID starting 9/4.  -Labs reviewed, mild leukopenia and thrombocytopenia from Verzenio, ok to continue -Plan to start letrozole in 2 weeks if she is tolerating Verzenio better, and possibly increase to 150 AM/100 pm if she is doing well -Zoladex today, then F/up and Zoladex in 2 weeks     Metastasis to bone -PET scan showed multifocal osseous metastasis  -Started calcium and vitamin D supplement -Zometa q 3 months for 2 years. -Due to her significant left shoulder and scapular pain, we again discussed the role of palliative radiation, she is interested but still wants to hold due to not feeling well and high co-pay -Hydrocodone made her feel drugged, I discussed 1/2 tab. She prefers alternative. I called in tramadol PRN    PLAN: -Labs reviewed -Proceed with zoladex today and in 2 weeks -Rx: Tramadol for pain (Norco was too strong) -Skip tonight's verzenio dose, starting tomorrow 9/4 take 100 mg BID (Due to SE's fatigue, abdominal pain, and diarrhea) -OK to take  tumeric and ginger, verified by pharmacy -F/up in 2 weeks, will likely start letrozole and ?titrate verzenio if tolerating -Start Zometa at next visit (q3 months)    All questions were answered. The patient knows to call the clinic with any problems, questions or concerns. No barriers to learning were detected. I spent 20 minutes counseling the patient face to face. The total time spent in the appointment was 30 minutes and more than 50% was on counseling, review of test results, and coordination of care.   Santiago Glad, NP-C 07/17/2023

## 2023-07-17 ENCOUNTER — Encounter: Payer: Self-pay | Admitting: Nurse Practitioner

## 2023-07-17 ENCOUNTER — Inpatient Hospital Stay: Payer: BC Managed Care – PPO | Attending: Hematology

## 2023-07-17 ENCOUNTER — Inpatient Hospital Stay: Payer: BC Managed Care – PPO

## 2023-07-17 ENCOUNTER — Inpatient Hospital Stay (HOSPITAL_BASED_OUTPATIENT_CLINIC_OR_DEPARTMENT_OTHER): Payer: BC Managed Care – PPO | Admitting: Nurse Practitioner

## 2023-07-17 VITALS — BP 125/65 | HR 70 | Temp 97.6°F | Resp 16

## 2023-07-17 VITALS — BP 110/56 | HR 78 | Temp 97.6°F | Resp 16 | Ht 63.0 in | Wt 124.4 lb

## 2023-07-17 DIAGNOSIS — C50212 Malignant neoplasm of upper-inner quadrant of left female breast: Secondary | ICD-10-CM

## 2023-07-17 DIAGNOSIS — Z170 Estrogen receptor positive status [ER+]: Secondary | ICD-10-CM

## 2023-07-17 DIAGNOSIS — Z9012 Acquired absence of left breast and nipple: Secondary | ICD-10-CM | POA: Insufficient documentation

## 2023-07-17 DIAGNOSIS — C7951 Secondary malignant neoplasm of bone: Secondary | ICD-10-CM | POA: Diagnosis not present

## 2023-07-17 DIAGNOSIS — Z79899 Other long term (current) drug therapy: Secondary | ICD-10-CM | POA: Diagnosis not present

## 2023-07-17 LAB — CBC WITH DIFFERENTIAL (CANCER CENTER ONLY)
Abs Immature Granulocytes: 0.01 10*3/uL (ref 0.00–0.07)
Basophils Absolute: 0 10*3/uL (ref 0.0–0.1)
Basophils Relative: 0 %
Eosinophils Absolute: 0.1 10*3/uL (ref 0.0–0.5)
Eosinophils Relative: 3 %
HCT: 35.5 % — ABNORMAL LOW (ref 36.0–46.0)
Hemoglobin: 12 g/dL (ref 12.0–15.0)
Immature Granulocytes: 0 %
Lymphocytes Relative: 45 %
Lymphs Abs: 1.7 10*3/uL (ref 0.7–4.0)
MCH: 31.1 pg (ref 26.0–34.0)
MCHC: 33.8 g/dL (ref 30.0–36.0)
MCV: 92 fL (ref 80.0–100.0)
Monocytes Absolute: 0.1 10*3/uL (ref 0.1–1.0)
Monocytes Relative: 3 %
Neutro Abs: 1.8 10*3/uL (ref 1.7–7.7)
Neutrophils Relative %: 49 %
Platelet Count: 126 10*3/uL — ABNORMAL LOW (ref 150–400)
RBC: 3.86 MIL/uL — ABNORMAL LOW (ref 3.87–5.11)
RDW: 12.6 % (ref 11.5–15.5)
WBC Count: 3.8 10*3/uL — ABNORMAL LOW (ref 4.0–10.5)
nRBC: 0 % (ref 0.0–0.2)

## 2023-07-17 LAB — CMP (CANCER CENTER ONLY)
ALT: 10 U/L (ref 0–44)
AST: 13 U/L — ABNORMAL LOW (ref 15–41)
Albumin: 4 g/dL (ref 3.5–5.0)
Alkaline Phosphatase: 52 U/L (ref 38–126)
Anion gap: 6 (ref 5–15)
BUN: 11 mg/dL (ref 6–20)
CO2: 27 mmol/L (ref 22–32)
Calcium: 9 mg/dL (ref 8.9–10.3)
Chloride: 106 mmol/L (ref 98–111)
Creatinine: 0.86 mg/dL (ref 0.44–1.00)
GFR, Estimated: >60 mL/min (ref >60)
Glucose, Bld: 105 mg/dL — ABNORMAL HIGH (ref 70–99)
Potassium: 3.7 mmol/L (ref 3.5–5.1)
Sodium: 139 mmol/L (ref 135–145)
Total Bilirubin: 0.4 mg/dL (ref 0.3–1.2)
Total Protein: 6.6 g/dL (ref 6.5–8.1)

## 2023-07-17 MED ORDER — ABEMACICLIB 100 MG PO TABS: 100.0000 mg | ORAL_TABLET | Freq: Two times a day (BID) | ORAL | 1 refills | Status: DC

## 2023-07-17 MED ORDER — TRAMADOL HCL 50 MG PO TABS: 50.0000 mg | ORAL_TABLET | Freq: Three times a day (TID) | ORAL | 0 refills | Status: DC | PRN

## 2023-07-17 MED ORDER — FULVESTRANT 250 MG/5ML IM SOSY
500.0000 mg | PREFILLED_SYRINGE | Freq: Once | INTRAMUSCULAR | Status: AC
  Administered 2023-07-17: 500 mg via INTRAMUSCULAR
  Filled 2023-07-17: qty 10

## 2023-07-18 ENCOUNTER — Inpatient Hospital Stay: Payer: BC Managed Care – PPO | Admitting: Hematology

## 2023-07-18 ENCOUNTER — Inpatient Hospital Stay: Payer: BC Managed Care – PPO

## 2023-07-31 ENCOUNTER — Inpatient Hospital Stay: Payer: BC Managed Care – PPO

## 2023-07-31 ENCOUNTER — Inpatient Hospital Stay: Payer: BC Managed Care – PPO | Admitting: Hematology

## 2023-07-31 NOTE — Assessment & Plan Note (Signed)
-  She unfortunately developed metastatic breast cancer to multiple sites of her bones in June 2024 -I recommend her to start a calcium and vitamin D supplement -I discussed the benefit and the potential side effect of biphosphonate Zometa, especially risk of jaw necrosis and infusion reactions.  She voiced good understanding and agrees to proceed.  She does not have a dentist and we will watch closely. plan to give her every 3 months for 2 years. -Due to her significant left shoulder pain, we discussed the role of palliative radiation, she is interested.  However she has high co-pay, so she wants to hold it for now.

## 2023-07-31 NOTE — Assessment & Plan Note (Signed)
[name], stage IIB, ER: Positive, PR: Negative, HER2: Negative, Grade 3, bone metastasis in 05/2023 -She was diagnosed in 04/2017. She is s/p left breast mastectomy and adjuvant radiation. Mammaprint showed low risk. -She started antiestrogen therapy with Tamoxifen in 10/2017. She is tolerating well, plan for 10 year. She has not had a menstrual period since 2018.  Her FSH in March 2021 showed that she is still premenopausal --Due to worsening left shoulder pain, she underwent CT and bone scan in [place] during her trip. Unfortunately scan showed multiple bone lesion and uptake in sternum, cervical spine, left scapular, left 10th rib, and left hip, concerning for bone mets. She underwent bone biopsy of left scapula and sternum, both confirmed metastatic breast cancer, ER positive (weak to moderate in left scapula biopsy, and 80% strong positive in sternal biopsy), PR weak to moderate positive, HER2 2+ in left scapular, 1+ in sternal bone biopsy -PET scan from June 21, 2023 showed multifocal hypermetabolic osseous metastatic disease. No definite pathologic fracture or epidural tumor identified.  No visceral metastasis.   -she started fulvestrant injection and Verzenio on August 13, 2023.  Due to poor tolerance to Verzenio, dose was reduced to 400 mg twice daily -She is scheduled for BSO on August 24, 2023

## 2023-08-01 ENCOUNTER — Inpatient Hospital Stay (HOSPITAL_BASED_OUTPATIENT_CLINIC_OR_DEPARTMENT_OTHER): Payer: BC Managed Care – PPO | Admitting: Hematology

## 2023-08-01 ENCOUNTER — Ambulatory Visit (HOSPITAL_BASED_OUTPATIENT_CLINIC_OR_DEPARTMENT_OTHER)
Admission: RE | Admit: 2023-08-01 | Discharge: 2023-08-01 | Disposition: A | Discharge status: Home / Self Care | Payer: BC Managed Care – PPO | Source: Ambulatory Visit | Attending: Hematology | Admitting: Hematology

## 2023-08-01 ENCOUNTER — Inpatient Hospital Stay: Payer: BC Managed Care – PPO

## 2023-08-01 ENCOUNTER — Encounter: Payer: Self-pay | Admitting: Hematology

## 2023-08-01 ENCOUNTER — Inpatient Hospital Stay: Payer: BC Managed Care – PPO | Admitting: Hematology

## 2023-08-01 VITALS — BP 112/59 | HR 72 | Temp 98.0°F | Resp 14 | Ht 63.0 in | Wt 124.3 lb

## 2023-08-01 VITALS — BP 109/77 | HR 63

## 2023-08-01 DIAGNOSIS — M7989 Other specified soft tissue disorders: Secondary | ICD-10-CM | POA: Insufficient documentation

## 2023-08-01 DIAGNOSIS — Z170 Estrogen receptor positive status [ER+]: Secondary | ICD-10-CM

## 2023-08-01 DIAGNOSIS — Z79899 Other long term (current) drug therapy: Secondary | ICD-10-CM | POA: Diagnosis not present

## 2023-08-01 DIAGNOSIS — C50212 Malignant neoplasm of upper-inner quadrant of left female breast: Secondary | ICD-10-CM

## 2023-08-01 DIAGNOSIS — Z9012 Acquired absence of left breast and nipple: Secondary | ICD-10-CM | POA: Diagnosis not present

## 2023-08-01 DIAGNOSIS — C7951 Secondary malignant neoplasm of bone: Secondary | ICD-10-CM

## 2023-08-01 LAB — CMP (CANCER CENTER ONLY)
ALT: 10 U/L (ref 0–44)
AST: 12 U/L — ABNORMAL LOW (ref 15–41)
Albumin: 4.1 g/dL (ref 3.5–5.0)
Alkaline Phosphatase: 53 U/L (ref 38–126)
Anion gap: 5 (ref 5–15)
BUN: 12 mg/dL (ref 6–20)
CO2: 29 mmol/L (ref 22–32)
Calcium: 9.3 mg/dL (ref 8.9–10.3)
Chloride: 107 mmol/L (ref 98–111)
Creatinine: 0.85 mg/dL (ref 0.44–1.00)
GFR, Estimated: >60 mL/min (ref >60)
Glucose, Bld: 78 mg/dL (ref 70–99)
Potassium: 3.7 mmol/L (ref 3.5–5.1)
Sodium: 141 mmol/L (ref 135–145)
Total Bilirubin: 0.4 mg/dL (ref 0.3–1.2)
Total Protein: 7 g/dL (ref 6.5–8.1)

## 2023-08-01 LAB — CBC WITH DIFFERENTIAL (CANCER CENTER ONLY)
Abs Immature Granulocytes: 0 10*3/uL (ref 0.00–0.07)
Basophils Absolute: 0 10*3/uL (ref 0.0–0.1)
Basophils Relative: 1 %
Eosinophils Absolute: 0.1 10*3/uL (ref 0.0–0.5)
Eosinophils Relative: 3 %
HCT: 34 % — ABNORMAL LOW (ref 36.0–46.0)
Hemoglobin: 11.2 g/dL — ABNORMAL LOW (ref 12.0–15.0)
Immature Granulocytes: 0 %
Lymphocytes Relative: 49 %
Lymphs Abs: 1.8 10*3/uL (ref 0.7–4.0)
MCH: 30.9 pg (ref 26.0–34.0)
MCHC: 32.9 g/dL (ref 30.0–36.0)
MCV: 93.9 fL (ref 80.0–100.0)
Monocytes Absolute: 0.2 10*3/uL (ref 0.1–1.0)
Monocytes Relative: 7 %
Neutro Abs: 1.4 10*3/uL — ABNORMAL LOW (ref 1.7–7.7)
Neutrophils Relative %: 40 %
Platelet Count: 197 10*3/uL (ref 150–400)
RBC: 3.62 MIL/uL — ABNORMAL LOW (ref 3.87–5.11)
RDW: 13.2 % (ref 11.5–15.5)
WBC Count: 3.5 10*3/uL — ABNORMAL LOW (ref 4.0–10.5)
nRBC: 0 % (ref 0.0–0.2)

## 2023-08-01 MED ORDER — ZOLEDRONIC ACID 4 MG/100ML IV SOLN
4.0000 mg | Freq: Once | INTRAVENOUS | Status: AC
  Administered 2023-08-01: 4 mg via INTRAVENOUS
  Filled 2023-08-01: qty 100

## 2023-08-01 MED ORDER — FULVESTRANT 250 MG/5ML IM SOSY
500.0000 mg | PREFILLED_SYRINGE | Freq: Once | INTRAMUSCULAR | Status: AC
  Administered 2023-08-01: 500 mg via INTRAMUSCULAR
  Filled 2023-08-01: qty 10

## 2023-08-01 MED ORDER — SODIUM CHLORIDE 0.9 % IV SOLN: Freq: Once | INTRAVENOUS | Status: AC

## 2023-08-01 NOTE — Progress Notes (Signed)
Left lower extremity venous duplex has been completed. Preliminary results can be found in CV Proc through chart review.  Results were given to [facility] at Dr. Latanya Maudlin office.  08/01/23 11:06 AM Olen Cordial RVT

## 2023-08-01 NOTE — Progress Notes (Signed)
[facility]   [phone] [phone]   Clinic Follow up Note   Patient Care Team: Patient, No Pcp Per as PCP - General (General Practice) Malachy Mood, MD as Consulting Physician (Hematology) Griselda Miner, MD as Consulting Physician (General Surgery) Lonie Peak, MD as Attending Physician (Radiation Oncology)  Date of Service:  08/01/2023  CHIEF COMPLAINT: f/u of breast cancer   CURRENT THERAPY:  Letrozole and Faslodex plus Verzenio, starting 06/25/23   ASSESSMENT:  Kristina Nicholson is a 57 y.o. female with   Malignant neoplasm of upper-inner quadrant of left breast in female, estrogen receptor positive (HCC) [name], stage IIB, ER: Positive, PR: Negative, HER2: Negative, Grade 3, bone metastasis in 05/2023 -She was diagnosed in 04/2017. She is s/p left breast mastectomy and adjuvant radiation. Mammaprint showed low risk. -She started antiestrogen therapy with Tamoxifen in 10/2017. She is tolerating well, plan for 10 year. She has not had a menstrual period since 2018.  Her FSH in March 2021 showed that she is still premenopausal --Due to worsening left shoulder pain, she underwent CT and bone scan in [place] during her trip. Unfortunately scan showed multiple bone lesion and uptake in sternum, cervical spine, left scapular, left 10th rib, and left hip, concerning for bone mets. She underwent bone biopsy of left scapula and sternum, both confirmed metastatic breast cancer, ER positive (weak to moderate in left scapula biopsy, and 80% strong positive in sternal biopsy), PR weak to moderate positive, HER2 2+ in left scapular, 1+ in sternal bone biopsy -PET scan from June 21, 2023 showed multifocal hypermetabolic osseous metastatic disease. No definite pathologic fracture or epidural tumor identified.  No visceral metastasis.   -she started fulvestrant injection and Verzenio on August 13, 2023.  Due to poor tolerance to Verzenio, dose was reduced to 100  mg twice daily -She is scheduled for BSO on August 24, 2023, we discussed option of changing fulvestrant injection to AI after BSO.   Metastasis to bone [facility]) -She unfortunately developed metastatic breast cancer to multiple sites of her bones in June 2024 -I recommend her to start a calcium and vitamin D supplement -I discussed the benefit and the potential side effect of biphosphonate Zometa, especially risk of jaw necrosis and infusion reactions.  She voiced good understanding and agrees to proceed.  She does not have a dentist and we will watch closely. plan to give her every 3 months for 2 years. -Due to her significant left shoulder pain, we discussed the role of palliative radiation, she is interested.  However she has high co-pay, so she wants to hold it for now.   PLAN: -lab reviewed - I will order Doppler of left UE to rule out PE -proceed with first time  Zometa and Faslodex injection today -I recommend taking Pepcid as needed for upset stomach due to taking Verzenio, and take verzenio with food  -after surgery I will change fulvestrant injection to Anastrozole or Letrozole -lab and f/u on 10/17  SUMMARY OF ONCOLOGIC HISTORY: Oncology History Overview Note  Cancer Staging Malignant neoplasm of upper-inner quadrant of left breast in female, estrogen receptor positive (HCC) Staging form: Breast, AJCC 8th Edition - Clinical stage from 04/13/2017: Stage IIA (cT2, cN0, cM0, G2, ER: Positive, PR: Negative, HER2: Negative) - Signed by Malachy Mood, MD on 04/25/2017 - Pathologic stage from 06/13/2017: Stage IIB (pT2, pN1a, cM0, G1, ER: Positive, PR: Negative, HER2: Negative) - Signed by Malachy Mood, MD on 09/04/2017     Malignant  neoplasm of upper-inner quadrant of left breast in female, estrogen receptor positive (HCC)  04/10/2017 Mammogram   Left breast mammo and US showed an irregular hypoechoic shadowing mass at 9 o'clock 1 cm from the nipple measuring 2.3 x 1 x 2.4 cm. This corresponds  well with the mass seen at mammography. No lymphadenopathy seen in the left axilla.   04/18/2017 Initial Diagnosis   Malignant neoplasm of upper-inner quadrant of left breast in female, estrogen receptor positive (HCC)   04/22/2017 Receptors her2   Estrogen Receptor: 95%, POSITIVE, STRONG STAINING INTENSITY Progesterone Receptor: 0%, NEGATIVE Proliferation Marker Ki67: 10% HER2 -   04/22/2017 Initial Biopsy   Breast, left, needle core biopsy, 9:30 o'clock - INVASIVE DUCTAL CARCINOMA, G2 - DUCTAL CARCINOMA IN SITU.   06/13/2017 Surgery   Left breast mastectomy with sentinel lymph node biopsy performed by Dr Carolynne Edouard.    06/13/2017 Pathology Results   Breast, simple mastectomy, Left - INVASIVE DUCTAL CARCINOMA, GRADE I/III, SPANNING 2.5 CM. - DUCTAL CARCINOMA IN SITU, INTERMEDIATE GRADE. - LOBULAR NEOPLASIA (ATYPICAL LOBULAR HYPERPLASIA). - THE SURGICAL RESECTION MARGINS ARE NEGATIVE FOR CARCINOMA.  Lymph node, sentinel, biopsy, Left axillary #1 - METASTATIC CARCINOMA IN 1 OF 3 LYMPH NODE (1/3), WITH EXTRACAPSULAR EXTENSION.   06/13/2017 Pathology Results   Mammaprint with low risk disease.    07/31/2017 - 09/10/2017 Radiation Therapy   The patient began a course of radiotherapy over the left chest wall and axillary area, supervised by Dr Lonie Peak, MD.     Radiation treatment dates:   07/31/2017 - 09/10/2017   Site/dose:    1) Left Chest Wall / 50 Gy in 25 fractions 2) Left Supraclavicular / 45 Gy in 25 fractions 3) Left Chest Wall Scar Boost / 10 Gy in 5 fractions   Beams/energy:    1) Opposed tangents 3D/ 10 and 6 MV photons 2) Additional fields 3D / 10 MV and 6 MV photons 3) En face electrons / 6 MeV electrons   Narrative: The patient tolerated radiation treatment relatively well. She experienced mild fatigue. She reported pain, burning, and itching to the left breast and swelling under the left arm. On physical exam, she had dry peeling in the left UIQ of the breast and left  axilla, as well as diffuse hyperpigmentation and erythema of the left chest wall. She is using the radiaplex cream to the treatment area and neosporin to the peeling areas.   10/13/2017 -  Anti-estrogen oral therapy   Tamoxifen 10mg  daily   04/11/2018 Mammogram   04/11/2018 Mammogram IMPRESSION: No mammographic evidence of malignancy. A result letter of this screening mammogram will be mailed directly to the patient.   04/11/2018 Mammogram   Screening Unilateral Right: No evidence of mammographic malignancy.       INTERVAL HISTORY:  Kristina Nicholson is here for a follow up of breast cancer. She was last seen by NP Lacie on 07/17/2023. She presents to the clinic accompanied by husband. Pt report of have left arm /shoulder pain and it is swollen. Pt state that when she eat something she has diarrhea.She also complains of having abdominal pain after she takes the [employer].      All other systems were reviewed with the patient and are negative.  MEDICAL HISTORY:  Past Medical History:  Diagnosis Date   Breast mass    lt breast mass ? doesnt know how long   Headache    History of radiation therapy 07/31/17- 09/10/17   LEft chest wass 50 gy  in 25 fractions, Left Supraclavicular 45 Gy in 25 fractions, Left Chest wall scar boost 10 Gy in 5 fractions.    Malignant neoplasm of upper-inner quadrant of left breast in female, estrogen receptor positive (HCC) 04/18/2017   Personal history of radiation therapy    UTI (urinary tract infection)     SURGICAL HISTORY: Past Surgical History:  Procedure Laterality Date   APPENDECTOMY     COLONOSCOPY     GANGLION CYST EXCISION Right 09/24/2017   Procedure: EXCISION OF RIGHT DORSAL WRIST GANGLION;  Surgeon: Mack Hook, MD;  Location: [facility];  Service: Orthopedics;  Laterality: Right;   LIPOMA EXCISION Left 06/13/2017   Procedure: EXCISION 3CM LIPOMA ON LEFT ARM;  Surgeon: Griselda Miner, MD;  Location: [facility] OR;  Service:  General;  Laterality: Left;   MASTECTOMY Left 2018   MASTECTOMY W/ SENTINEL NODE BIOPSY Left 06/13/2017   Procedure: MASTECTOMY WITH SENTINEL LYMPH NODE BIOPSY;  Surgeon: Griselda Miner, MD;  Location: [facility] OR;  Service: General;  Laterality: Left;    I have reviewed the social history and family history with the patient and they are unchanged from previous note.  ALLERGIES:  has No Known Allergies.  MEDICATIONS:  Current Outpatient Medications  Medication Sig Dispense Refill   abemaciclib (VERZENIO) 100 MG tablet Take 1 tablet (100 mg total) by mouth 2 (two) times daily. 60 tablet 1   acetaminophen (TYLENOL) 325 MG tablet Take 2 tablets (650 mg total) every 6 (six) hours by mouth.     traMADol (ULTRAM) 50 MG tablet Take 1 tablet (50 mg total) by mouth every 8 (eight) hours as needed. 30 tablet 0   No current facility-administered medications for this visit.    PHYSICAL EXAMINATION: ECOG PERFORMANCE STATUS: 1 - Symptomatic but completely ambulatory  Vitals:   08/01/23 0835  BP: (!) 112/59  Pulse: 72  Resp: 14  Temp: 98 F (36.7 C)  SpO2: 100%   Wt Readings from Last 3 Encounters:  08/01/23 124 lb 4.8 oz (56.4 kg)  07/17/23 124 lb 7 oz (56.4 kg)  06/25/23 127 lb 6.4 oz (57.8 kg)     GENERAL:alert, no distress and comfortable SKIN: skin color normal, no rashes or significant lesions EYES: normal, Conjunctiva are pink and non-injected, sclera clear  NEURO: alert & oriented x 3 with fluent speech LABORATORY DATA:  I have reviewed the data as listed    Latest Ref Rng & Units 08/01/2023    8:02 AM 07/17/2023    8:54 AM 06/25/2023    9:44 AM  CBC  WBC 4.0 - 10.5 K/uL 3.5  3.8  4.5   Hemoglobin 12.0 - 15.0 g/dL 15.1  76.1  60.7   Hematocrit 36.0 - 46.0 % 34.0  35.5  35.1   Platelets 150 - 400 K/uL 197  126  224         Latest Ref Rng & Units 08/01/2023    8:02 AM 07/17/2023    8:54 AM 06/25/2023    9:44 AM  CMP  Glucose 70 - 99 mg/dL 78  371  062   BUN 6 - 20 mg/dL 12  11   12    Creatinine 0.44 - 1.00 mg/dL 6.94  8.54  6.27   Sodium 135 - 145 mmol/L 141  139  143   Potassium 3.5 - 5.1 mmol/L 3.7  3.7  3.6   Chloride 98 - 111 mmol/L 107  106  108   CO2 22 - 32 mmol/L  29  27  29    Calcium 8.9 - 10.3 mg/dL 9.3  9.0  8.8   Total Protein 6.5 - 8.1 g/dL 7.0  6.6  6.4   Total Bilirubin 0.3 - 1.2 mg/dL 0.4  0.4  0.5   Alkaline Phos 38 - 126 U/L 53  52  47   AST 15 - 41 U/L 12  13  14    ALT 0 - 44 U/L 10  10  11        RADIOGRAPHIC STUDIES: I have personally reviewed the radiological images as listed and agreed with the findings in the report. No results found.    Orders Placed This Encounter  Procedures   Cancer antigen 27.29    Standing Status:   Standing    Number of Occurrences:   20    Standing Expiration Date:   07/31/2024   All questions were answered. The patient knows to call the clinic with any problems, questions or concerns. No barriers to learning was detected. The total time spent in the appointment was 30 minutes.     Malachy Mood, MD 08/01/2023   Carolin Coy, CMA, am acting as scribe for Malachy Mood, MD.   I have reviewed the above documentation for accuracy and completeness, and I agree with the above.

## 2023-08-01 NOTE — Progress Notes (Signed)
No PA needed for Zometa.  Anola Gurney Anderson, [place], BCPS, BCOP 08/01/2023 9:38 AM

## 2023-08-01 NOTE — Patient Instructions (Signed)
Zoledronic Acid Injection (Cancer) What is this medication? ZOLEDRONIC ACID (ZOE le dron ik AS id) treats high calcium levels in the blood caused by cancer. It may also be used with chemotherapy to treat weakened bones caused by cancer. It works by slowing down the release of calcium from bones. This lowers calcium levels in your blood. It also makes your bones stronger and less likely to break (fracture). It belongs to a group of medications called bisphosphonates. This medicine may be used for other purposes; ask your health care provider or pharmacist if you have questions. COMMON BRAND NAME(S): Zometa, Zometa Powder What should I tell my care team before I take this medication? They need to know if you have any of these conditions: Dehydration Dental disease Kidney disease Liver disease Low levels of calcium in the blood Lung or breathing disease, such as asthma Receiving steroids, such as dexamethasone or prednisone An unusual or allergic reaction to zoledronic acid, other medications, foods, dyes, or preservatives Pregnant or trying to get pregnant Breast-feeding How should I use this medication? This medication is injected into a vein. It is given by your care team in a hospital or clinic setting. Talk to your care team about the use of this medication in children. Special care may be needed. Overdosage: If you think you have taken too much of this medicine contact a poison control center or emergency room at once. NOTE: This medicine is only for you. Do not share this medicine with others. What if I miss a dose? Keep appointments for follow-up doses. It is important not to miss your dose. Call your care team if you are unable to keep an appointment. What may interact with this medication? Certain antibiotics given by injection Diuretics, such as bumetanide, furosemide NSAIDs, medications for pain and inflammation, such as ibuprofen or naproxen Teriparatide Thalidomide This list  may not describe all possible interactions. Give your health care provider a list of all the medicines, herbs, non-prescription drugs, or dietary supplements you use. Also tell them if you smoke, drink alcohol, or use illegal drugs. Some items may interact with your medicine. What should I watch for while using this medication? Visit your care team for regular checks on your progress. It may be some time before you see the benefit from this medication. Some people who take this medication have severe bone, joint, or muscle pain. This medication may also increase your risk for jaw problems or a broken thigh bone. Tell your care team right away if you have severe pain in your jaw, bones, joints, or muscles. Tell you care team if you have any pain that does not go away or that gets worse. Tell your dentist and dental surgeon that you are taking this medication. You should not have major dental surgery while on this medication. See your dentist to have a dental exam and fix any dental problems before starting this medication. Take good care of your teeth while on this medication. Make sure you see your dentist for regular follow-up appointments. You should make sure you get enough calcium and vitamin D while you are taking this medication. Discuss the foods you eat and the vitamins you take with your care team. Check with your care team if you have severe diarrhea, nausea, and vomiting, or if you sweat a lot. The loss of too much body fluid may make it dangerous for you to take this medication. You may need bloodwork while taking this medication. Talk to your care team if  you wish to become pregnant or think you might be pregnant. This medication can cause serious birth defects. What side effects may I notice from receiving this medication? Side effects that you should report to your care team as soon as possible: Allergic reactions--skin rash, itching, hives, swelling of the face, lips, tongue, or  throat Kidney injury--decrease in the amount of urine, swelling of the ankles, hands, or feet Low calcium level--muscle pain or cramps, confusion, tingling, or numbness in the hands or feet Osteonecrosis of the jaw--pain, swelling, or redness in the mouth, numbness of the jaw, poor healing after dental work, unusual discharge from the mouth, visible bones in the mouth Severe bone, joint, or muscle pain Side effects that usually do not require medical attention (report to your care team if they continue or are bothersome): Constipation Fatigue Fever Loss of appetite Nausea Stomach pain This list may not describe all possible side effects. Call your doctor for medical advice about side effects. You may report side effects to FDA at 1-800-FDA-1088. Where should I keep my medication? This medication is given in a hospital or clinic. It will not be stored at home. NOTE: This sheet is a summary. It may not cover all possible information. If you have questions about this medicine, talk to your doctor, pharmacist, or health care provider.  2024 Elsevier/Gold Standard (2021-12-23 00:00:00)  Fulvestrant Injection What is this medication? FULVESTRANT (ful VES trant) treats breast cancer. It works by blocking the hormone estrogen in breast tissue, which prevents breast cancer cells from spreading or growing. This medicine may be used for other purposes; ask your health care provider or pharmacist if you have questions. COMMON BRAND NAME(S): FASLODEX What should I tell my care team before I take this medication? They need to know if you have any of these conditions: Bleeding disorder Liver disease Low blood cell levels, such as low white cells, red cells, and platelets An unusual or allergic reaction to fulvestrant, other medications, foods, dyes, or preservatives Pregnant or trying to get pregnant Breast-feeding How should I use this medication? This medication is injected into a muscle. It is  given by your care team in a hospital or clinic setting. Talk to your care team about the use of this medication in children. Special care may be needed. Overdosage: If you think you have taken too much of this medicine contact a poison control center or emergency room at once. NOTE: This medicine is only for you. Do not share this medicine with others. What if I miss a dose? Keep appointments for follow-up doses. It is important not to miss your dose. Call your care team if you are unable to keep an appointment. What may interact with this medication? Certain medications that prevent or treat blood clots, such as warfarin, enoxaparin, dalteparin, apixaban, dabigatran, rivaroxaban This list may not describe all possible interactions. Give your health care provider a list of all the medicines, herbs, non-prescription drugs, or dietary supplements you use. Also tell them if you smoke, drink alcohol, or use illegal drugs. Some items may interact with your medicine. What should I watch for while using this medication? Your condition will be monitored carefully while you are receiving this medication. You may need blood work while taking this medication. Talk to your care team if you may be pregnant. Serious birth defects can occur if you take this medication during pregnancy and for 1 year after the last dose. You will need a negative pregnancy test before starting this medication.  Contraception is recommended while taking this medication and for 1 year after the last dose. Your care team can help you find the option that works for you. Do not breastfeed while taking this medication and for 1 year after the last dose. This medication may cause infertility. Talk to your care team if you are concerned about your fertility. What side effects may I notice from receiving this medication? Side effects that you should report to your care team as soon as possible: Allergic reactions or angioedema--skin rash,  itching or hives, swelling of the face, eyes, lips, tongue, arms, or legs, trouble swallowing or breathing Pain, tingling, or numbness in the hands or feet Side effects that usually do not require medical attention (report to your care team if they continue or are bothersome): Bone, joint, or muscle pain Constipation Headache Hot flashes Nausea Pain, redness, or irritation at injection site Unusual weakness or fatigue This list may not describe all possible side effects. Call your doctor for medical advice about side effects. You may report side effects to FDA at 1-800-FDA-1088. Where should I keep my medication? This medication is given in a hospital or clinic. It will not be stored at home. NOTE: This sheet is a summary. It may not cover all possible information. If you have questions about this medicine, talk to your doctor, pharmacist, or health care provider.  2024 Elsevier/Gold Standard (2022-03-14 00:00:00)

## 2023-08-01 NOTE — Progress Notes (Signed)
Kristina Nicholson in Vascular (Doppler) called to give report.  Kristina Nicholson stated that pt is (-) for DVT in LUE.  Stated pt is free to go home.  This nurse made Dr. Mosetta Putt aware of the pt's results.

## 2023-08-02 ENCOUNTER — Other Ambulatory Visit: Payer: Self-pay

## 2023-08-15 ENCOUNTER — Encounter (HOSPITAL_BASED_OUTPATIENT_CLINIC_OR_DEPARTMENT_OTHER): Payer: Self-pay | Admitting: Obstetrics & Gynecology

## 2023-08-16 ENCOUNTER — Other Ambulatory Visit: Payer: Self-pay | Admitting: Nurse Practitioner

## 2023-08-16 ENCOUNTER — Encounter (HOSPITAL_BASED_OUTPATIENT_CLINIC_OR_DEPARTMENT_OTHER): Payer: Self-pay | Admitting: Obstetrics & Gynecology

## 2023-08-16 DIAGNOSIS — Z170 Estrogen receptor positive status [ER+]: Secondary | ICD-10-CM

## 2023-08-16 NOTE — Progress Notes (Signed)
Spoke w/ via phone for pre-op interview--- Kristina Nicholson and Kristina Nicholson's husband, Kristina Nicholson needs dos---- cbc, t&s, urine preg, hiv/ [id number]       Nicholson results------ no COVID test -----patient states asymptomatic no test needed Arrive at ------- 0530 on 08-24-2023 NPO after MN  Med rec completed Medications to take morning of surgery ----- none Diabetic medication ----- n/a Patient instructed no nail polish to be worn day of surgery Patient instructed to bring photo id and insurance card day of surgery Patient aware to have Driver (ride ) / caregiver for 24 hours after surgery - husband, Kristina Patient Special Instructions ----- n/a Pre-Op special Instructions -----  Kristina Nicholson stated she did not need an interpreter dos (speaks and understands english) but would like her husband to be with her in pre-op to help with some words, seems that some a word would say she would not understand but when husband would say it she did.  I think it may be my accent.  They get each other, it's so sweet.  Patient verbalized understanding of instructions that were given at this phone interview. Patient denies chest pain, sob, fever, cough at the interview.

## 2023-08-17 NOTE — H&P (Signed)
Kristina Nicholson is an 57 y.o. female with Stage IV breast cancer managed by Dr. Malachy Mood.  Patient was dx 04/2017 and is s/p left mastectomy and adjuvant radiation.  She was started on Tamoxifen in 2018.  Unfortunately, when in [place] this summer, patient developed left shoulder pain and on CT, multiple bone lesions were seen. Bone bx confirmed metastatic breast cancer.  Patient is still likely perimenopausal as labs 7/26 showed FSH 10.9.  Dr. Mosetta Putt suggested consideration of BSO to improve treatment effectiveness and patient would like to proceed.  Pertinent Gynecological History: Menses:  amenorrhea Bleeding: n/a Contraception: none DES exposure: unknown Blood transfusions: none Sexually transmitted diseases: no past history Previous GYN Procedures:  none   Last mammogram: normal Date: 10/04/22  Last pap: normal Date: 08/14/22 OB History: G2, P1011   Menstrual History: Menarche age: n/a Patient's last menstrual period was 06/22/2015.    Past Medical History:  Diagnosis Date   Chronic left shoulder pain    Headache    History of external beam radiation therapy    07-31-2017  to  09-10-2017  LEft chest wass 50 gy in 25 fractions, Left Supraclavicular 45 Gy in 25 fractions, Left Chest wall scar boost 10 Gy in 5 fractions.   Horseshoe kidney    Malignant neoplasm of upper-inner quadrant of left breast in female, estrogen receptor positive (HCC) 04/18/2017   oncologist--- dr Mosetta Putt;   dx 06/ 2018 ,  Stage IIB, ER/PR+, G3, IDC/ DCIS;  06-13-2017 s/p left mastectomy w/ node dissection ;  completed radiation 09-10-2017;   recurrence w/ mets to bone 07/ 2024 , started chemotherapy   Metastatic cancer to bone (HCC) 05/2023   Uterine fibroid     Past Surgical History:  Procedure Laterality Date   APPENDECTOMY  2005   COLONOSCOPY  2016   GANGLION CYST EXCISION Right 09/24/2017   Procedure: EXCISION OF RIGHT DORSAL WRIST GANGLION;  Surgeon: Mack Hook, MD;  Location: [facility];  Service: Orthopedics;  Laterality: Right;   LIPOMA EXCISION Left 06/13/2017   Procedure: EXCISION 3CM LIPOMA ON LEFT ARM;  Surgeon: Griselda Miner, MD;  Location: [facility] OR;  Service: General;  Laterality: Left;   MASTECTOMY W/ SENTINEL NODE BIOPSY Left 06/13/2017   Procedure: MASTECTOMY WITH SENTINEL LYMPH NODE BIOPSY;  Surgeon: Griselda Miner, MD;  Location: [facility] OR;  Service: General;  Laterality: Left;    Family History  Problem Relation Age of Onset   Colon cancer Brother     Social History:  reports that she quit smoking about 6 years ago. Her smoking use included cigarettes. She started smoking about 38 years ago. She has a 8 pack-year smoking history. She has never used smokeless tobacco. She reports that she does not drink alcohol and does not use drugs.  Allergies: No Known Allergies  No medications prior to admission.    Review of Systems  Height 5\' 3"  (1.6 m), weight 54.9 kg, last menstrual period 06/22/2015. Physical Exam Constitutional:      Appearance: Normal appearance.  HENT:     Head: Normocephalic and atraumatic.  Pulmonary:     Effort: Pulmonary effort is normal.  Abdominal:     Palpations: Abdomen is soft.  Musculoskeletal:        General: Normal range of motion.     Cervical back: Normal range of motion.  Skin:    General: Skin is warm and dry.  Neurological:     Mental Status: She is alert and  oriented to person, place, and time.  Psychiatric:        Mood and Affect: Mood normal.        Behavior: Behavior normal.     No results found for this or any previous visit (from the past 24 hour(s)).  No results found.  Assessment/Plan: 57yo with metastatic breast cancer; peri-menopause. Patient is offered L/S BSO to improve treatment of her metastatic disease.  She has elected to proceed with this.  Patient is counseled re: risk of bleeding, infection, scarring and damage to surrounding structures.  She is counseled re: steps of procedure as well as  postop expectations and limitations.  All questions were answered and patient wishes to proceed.  [name] 08/17/2023, 10:22 AM

## 2023-08-22 ENCOUNTER — Telehealth: Payer: Self-pay | Admitting: Hematology

## 2023-08-22 ENCOUNTER — Other Ambulatory Visit: Payer: Self-pay

## 2023-08-22 DIAGNOSIS — C50212 Malignant neoplasm of upper-inner quadrant of left female breast: Secondary | ICD-10-CM

## 2023-08-22 MED ORDER — ABEMACICLIB 100 MG PO TABS: 100.0000 mg | ORAL_TABLET | Freq: Two times a day (BID) | ORAL | 1 refills | Status: DC

## 2023-08-23 NOTE — Anesthesia Preprocedure Evaluation (Addendum)
Anesthesia Evaluation  Patient identified by MRN, date of birth, ID band Patient awake    Reviewed: Allergy & Precautions, NPO status , Patient's Chart, lab work & pertinent test results  History of Anesthesia Complications Negative for: history of anesthetic complications  Airway Mallampati: III  TM Distance: >3 FB Neck ROM: Full   Comment: Previous grade II view with MAC 3, easy mask Dental  (+) Dental Advisory Given,    Pulmonary neg shortness of breath, neg sleep apnea, neg COPD, neg recent URI, Current Smoker and Patient abstained from smoking., former smoker   Pulmonary exam normal breath sounds clear to auscultation       Cardiovascular negative cardio ROS  Rhythm:Regular Rate:Normal     Neuro/Psych  Headaches, neg Seizures    GI/Hepatic negative GI ROS, Neg liver ROS,,,  Endo/Other  negative endocrine ROS    Renal/GU Renal disease (horseshoe kidney)     Musculoskeletal Osteopenia    Abdominal   Peds  Hematology negative hematology ROS (+) Lab Results      Component                Value               Date                      WBC                      3.5 (L)             08/01/2023                HGB                      11.2 (L)            08/01/2023                HCT                      34.0 (L)            08/01/2023                MCV                      93.9                08/01/2023                PLT                      197                 08/01/2023              Anesthesia Other Findings 57 y.o. female with Stage IV breast cancer managed by Dr. Malachy Mood.  Patient was dx 04/2017 and is s/p left mastectomy and adjuvant radiation.  She was started on Tamoxifen in 2018.  Unfortunately, when in [place] this summer, patient developed left shoulder pain and on CT, multiple bone lesions were seen. Bone bx confirmed metastatic breast cancer.  Reproductive/Obstetrics H/o breast cancer                               Anesthesia Physical Anesthesia Plan  ASA: 3  Anesthesia Plan: General   Post-op Pain Management: Tylenol PO (pre-op)*   Induction: Intravenous  PONV Risk Score and Plan: 3 and Ondansetron, Dexamethasone and Treatment may vary due to age or medical condition  Airway Management Planned: Oral ETT  Additional Equipment:   Intra-op Plan:   Post-operative Plan: Extubation in OR  Informed Consent: I have reviewed the patients History and Physical, chart, labs and discussed the procedure including the risks, benefits and alternatives for the proposed anesthesia with the patient or authorized representative who has indicated his/her understanding and acceptance.     Dental advisory given  Plan Discussed with: CRNA and Anesthesiologist  Anesthesia Plan Comments: (Patient speaks English and declined interpreter. Risks of general anesthesia discussed including, but not limited to, sore throat, hoarse voice, chipped/damaged teeth, injury to vocal cords, nausea and vomiting, allergic reactions, lung infection, heart attack, stroke, and death. All questions answered. )         Anesthesia Quick Evaluation

## 2023-08-24 ENCOUNTER — Ambulatory Visit (HOSPITAL_BASED_OUTPATIENT_CLINIC_OR_DEPARTMENT_OTHER): Payer: BC Managed Care – PPO | Admitting: Anesthesiology

## 2023-08-24 ENCOUNTER — Encounter (HOSPITAL_BASED_OUTPATIENT_CLINIC_OR_DEPARTMENT_OTHER): Payer: Self-pay | Admitting: Obstetrics & Gynecology

## 2023-08-24 ENCOUNTER — Other Ambulatory Visit: Payer: Self-pay

## 2023-08-24 ENCOUNTER — Ambulatory Visit (HOSPITAL_BASED_OUTPATIENT_CLINIC_OR_DEPARTMENT_OTHER)
Admission: RE | Admit: 2023-08-24 | Discharge: 2023-08-24 | Disposition: A | Discharge status: Home / Self Care | Payer: BC Managed Care – PPO | Attending: Obstetrics & Gynecology | Admitting: Obstetrics & Gynecology

## 2023-08-24 ENCOUNTER — Encounter (HOSPITAL_BASED_OUTPATIENT_CLINIC_OR_DEPARTMENT_OTHER)
Admission: RE | Disposition: A | Discharge status: Home / Self Care | Payer: Self-pay | Source: Home / Self Care | Attending: Obstetrics & Gynecology

## 2023-08-24 DIAGNOSIS — C7951 Secondary malignant neoplasm of bone: Secondary | ICD-10-CM | POA: Insufficient documentation

## 2023-08-24 DIAGNOSIS — Z923 Personal history of irradiation: Secondary | ICD-10-CM | POA: Diagnosis not present

## 2023-08-24 DIAGNOSIS — M8580 Other specified disorders of bone density and structure, unspecified site: Secondary | ICD-10-CM | POA: Insufficient documentation

## 2023-08-24 DIAGNOSIS — Z170 Estrogen receptor positive status [ER+]: Secondary | ICD-10-CM | POA: Diagnosis not present

## 2023-08-24 DIAGNOSIS — C50412 Malignant neoplasm of upper-outer quadrant of left female breast: Secondary | ICD-10-CM | POA: Insufficient documentation

## 2023-08-24 DIAGNOSIS — Z9221 Personal history of antineoplastic chemotherapy: Secondary | ICD-10-CM | POA: Diagnosis not present

## 2023-08-24 DIAGNOSIS — N83202 Unspecified ovarian cyst, left side: Secondary | ICD-10-CM | POA: Insufficient documentation

## 2023-08-24 DIAGNOSIS — R519 Headache, unspecified: Secondary | ICD-10-CM | POA: Insufficient documentation

## 2023-08-24 DIAGNOSIS — Q631 Lobulated, fused and horseshoe kidney: Secondary | ICD-10-CM | POA: Diagnosis not present

## 2023-08-24 DIAGNOSIS — Z87891 Personal history of nicotine dependence: Secondary | ICD-10-CM | POA: Insufficient documentation

## 2023-08-24 DIAGNOSIS — N83201 Unspecified ovarian cyst, right side: Secondary | ICD-10-CM | POA: Diagnosis not present

## 2023-08-24 DIAGNOSIS — N83292 Other ovarian cyst, left side: Secondary | ICD-10-CM | POA: Diagnosis not present

## 2023-08-24 DIAGNOSIS — N838 Other noninflammatory disorders of ovary, fallopian tube and broad ligament: Secondary | ICD-10-CM | POA: Diagnosis not present

## 2023-08-24 DIAGNOSIS — Z853 Personal history of malignant neoplasm of breast: Secondary | ICD-10-CM | POA: Diagnosis not present

## 2023-08-24 DIAGNOSIS — N189 Chronic kidney disease, unspecified: Secondary | ICD-10-CM | POA: Diagnosis not present

## 2023-08-24 DIAGNOSIS — Z1502 Genetic susceptibility to malignant neoplasm of ovary: Secondary | ICD-10-CM | POA: Diagnosis not present

## 2023-08-24 DIAGNOSIS — N83291 Other ovarian cyst, right side: Secondary | ICD-10-CM | POA: Diagnosis not present

## 2023-08-24 DIAGNOSIS — Z01818 Encounter for other preprocedural examination: Secondary | ICD-10-CM

## 2023-08-24 DIAGNOSIS — C7981 Secondary malignant neoplasm of breast: Secondary | ICD-10-CM | POA: Diagnosis not present

## 2023-08-24 DIAGNOSIS — Z4002 Encounter for prophylactic removal of ovary: Secondary | ICD-10-CM | POA: Diagnosis not present

## 2023-08-24 DIAGNOSIS — G8929 Other chronic pain: Secondary | ICD-10-CM | POA: Insufficient documentation

## 2023-08-24 DIAGNOSIS — C50212 Malignant neoplasm of upper-inner quadrant of left female breast: Secondary | ICD-10-CM | POA: Diagnosis not present

## 2023-08-24 HISTORY — DX: Personal history of irradiation: Z92.3

## 2023-08-24 HISTORY — DX: Lobulated, fused and horseshoe kidney: Q63.1

## 2023-08-24 HISTORY — PX: LAPAROSCOPIC SALPINGO OOPHERECTOMY: SHX5927

## 2023-08-24 HISTORY — DX: Other chronic pain: G89.29

## 2023-08-24 HISTORY — DX: Leiomyoma of uterus, unspecified: D25.9

## 2023-08-24 LAB — CBC
HCT: 34.7 % — ABNORMAL LOW (ref 36.0–46.0)
Hemoglobin: 11.6 g/dL — ABNORMAL LOW (ref 12.0–15.0)
MCH: 31.9 pg (ref 26.0–34.0)
MCHC: 33.4 g/dL (ref 30.0–36.0)
MCV: 95.3 fL (ref 80.0–100.0)
Platelets: 282 10*3/uL (ref 150–400)
RBC: 3.64 MIL/uL — ABNORMAL LOW (ref 3.87–5.11)
RDW: 15.3 % (ref 11.5–15.5)
WBC: 3.7 10*3/uL — ABNORMAL LOW (ref 4.0–10.5)
nRBC: 0 % (ref 0.0–0.2)

## 2023-08-24 LAB — ABO/RH: ABO/RH(D): B POS

## 2023-08-24 LAB — TYPE AND SCREEN
ABO/RH(D): B POS
Antibody Screen: NEGATIVE

## 2023-08-24 SURGERY — SALPINGO-OOPHORECTOMY, LAPAROSCOPIC
Anesthesia: General | Site: Abdomen | Laterality: Bilateral

## 2023-08-24 MED ORDER — 0.9 % SODIUM CHLORIDE (POUR BTL) OPTIME
TOPICAL | Status: DC | PRN
Start: 2023-08-24 — End: 2023-08-24
  Administered 2023-08-24: 500 mL

## 2023-08-24 MED ORDER — KETOROLAC TROMETHAMINE 30 MG/ML IJ SOLN
INTRAMUSCULAR | Status: AC
  Filled 2023-08-24: qty 1

## 2023-08-24 MED ORDER — ACETAMINOPHEN 500 MG PO TABS
1000.0000 mg | ORAL_TABLET | Freq: Once | ORAL | Status: AC
  Administered 2023-08-24: 1000 mg via ORAL

## 2023-08-24 MED ORDER — ROCURONIUM BROMIDE 10 MG/ML (PF) SYRINGE
PREFILLED_SYRINGE | INTRAVENOUS | Status: DC | PRN
  Administered 2023-08-24: 50 mg via INTRAVENOUS

## 2023-08-24 MED ORDER — FENTANYL CITRATE (PF) 100 MCG/2ML IJ SOLN: 25.0000 ug | INTRAMUSCULAR | Status: DC | PRN

## 2023-08-24 MED ORDER — MIDAZOLAM HCL 2 MG/2ML IJ SOLN
INTRAMUSCULAR | Status: AC
  Filled 2023-08-24: qty 2

## 2023-08-24 MED ORDER — PROPOFOL 10 MG/ML IV BOLUS
INTRAVENOUS | Status: AC
  Filled 2023-08-24: qty 20

## 2023-08-24 MED ORDER — DEXAMETHASONE SODIUM PHOSPHATE 10 MG/ML IJ SOLN
INTRAMUSCULAR | Status: DC | PRN
  Administered 2023-08-24: 10 mg via INTRAVENOUS

## 2023-08-24 MED ORDER — DEXAMETHASONE SODIUM PHOSPHATE 10 MG/ML IJ SOLN
INTRAMUSCULAR | Status: AC
  Filled 2023-08-24: qty 1

## 2023-08-24 MED ORDER — SUGAMMADEX SODIUM 200 MG/2ML IV SOLN
INTRAVENOUS | Status: DC | PRN
  Administered 2023-08-24: 150 mg via INTRAVENOUS

## 2023-08-24 MED ORDER — AMISULPRIDE (ANTIEMETIC) 5 MG/2ML IV SOLN: 10.0000 mg | Freq: Once | INTRAVENOUS | Status: DC | PRN

## 2023-08-24 MED ORDER — LACTATED RINGERS IV SOLN: INTRAVENOUS | Status: DC

## 2023-08-24 MED ORDER — ONDANSETRON HCL 4 MG/2ML IJ SOLN
INTRAMUSCULAR | Status: AC
  Filled 2023-08-24: qty 2

## 2023-08-24 MED ORDER — KETOROLAC TROMETHAMINE 30 MG/ML IJ SOLN
INTRAMUSCULAR | Status: DC | PRN
Start: 2023-08-24 — End: 2023-08-24
  Administered 2023-08-24: 30 mg via INTRAVENOUS

## 2023-08-24 MED ORDER — LIDOCAINE 2% (20 MG/ML) 5 ML SYRINGE
INTRAMUSCULAR | Status: DC | PRN
  Administered 2023-08-24: 60 mg via INTRAVENOUS

## 2023-08-24 MED ORDER — ROCURONIUM BROMIDE 10 MG/ML (PF) SYRINGE
PREFILLED_SYRINGE | INTRAVENOUS | Status: AC
  Filled 2023-08-24: qty 10

## 2023-08-24 MED ORDER — OXYCODONE HCL 5 MG PO TABS
ORAL_TABLET | ORAL | Status: AC
  Filled 2023-08-24: qty 1

## 2023-08-24 MED ORDER — BUPIVACAINE HCL (PF) 0.25 % IJ SOLN
INTRAMUSCULAR | Status: DC | PRN
  Administered 2023-08-24: 6 mL

## 2023-08-24 MED ORDER — OXYCODONE HCL 5 MG/5ML PO SOLN: 5.0000 mg | Freq: Once | ORAL | Status: AC | PRN

## 2023-08-24 MED ORDER — POVIDONE-IODINE 10 % EX SWAB: 2.0000 | Freq: Once | CUTANEOUS | Status: DC

## 2023-08-24 MED ORDER — IBUPROFEN 600 MG PO TABS: 600.0000 mg | ORAL_TABLET | Freq: Four times a day (QID) | ORAL | 0 refills | Status: DC | PRN

## 2023-08-24 MED ORDER — OXYCODONE-ACETAMINOPHEN 5-325 MG PO TABS: 1.0000 | ORAL_TABLET | ORAL | 0 refills | Status: AC | PRN

## 2023-08-24 MED ORDER — LIDOCAINE HCL (PF) 2 % IJ SOLN
INTRAMUSCULAR | Status: AC
  Filled 2023-08-24: qty 5

## 2023-08-24 MED ORDER — MIDAZOLAM HCL 2 MG/2ML IJ SOLN
INTRAMUSCULAR | Status: DC | PRN
  Administered 2023-08-24: 2 mg via INTRAVENOUS

## 2023-08-24 MED ORDER — ACETAMINOPHEN 500 MG PO TABS
ORAL_TABLET | ORAL | Status: AC
  Filled 2023-08-24: qty 2

## 2023-08-24 MED ORDER — FENTANYL CITRATE (PF) 100 MCG/2ML IJ SOLN
INTRAMUSCULAR | Status: AC
  Filled 2023-08-24: qty 2

## 2023-08-24 MED ORDER — PROPOFOL 10 MG/ML IV BOLUS
INTRAVENOUS | Status: DC | PRN
  Administered 2023-08-24: 200 mg via INTRAVENOUS

## 2023-08-24 MED ORDER — FENTANYL CITRATE (PF) 100 MCG/2ML IJ SOLN
INTRAMUSCULAR | Status: DC | PRN
  Administered 2023-08-24 (×2): 50 ug via INTRAVENOUS

## 2023-08-24 MED ORDER — ONDANSETRON HCL 4 MG/2ML IJ SOLN
INTRAMUSCULAR | Status: DC | PRN
  Administered 2023-08-24: 4 mg via INTRAVENOUS

## 2023-08-24 MED ORDER — OXYCODONE HCL 5 MG PO TABS
5.0000 mg | ORAL_TABLET | Freq: Once | ORAL | Status: AC | PRN
  Administered 2023-08-24: 5 mg via ORAL

## 2023-08-24 SURGICAL SUPPLY — 45 items
ADH SKN CLS APL DERMABOND .7 (GAUZE/BANDAGES/DRESSINGS) ×1
APL SKNCLS STERI-STRIP NONHPOA (GAUZE/BANDAGES/DRESSINGS)
BARRIER ADHS 3X4 INTERCEED (GAUZE/BANDAGES/DRESSINGS) IMPLANT
BENZOIN TINCTURE PRP APPL 2/3 (GAUZE/BANDAGES/DRESSINGS) IMPLANT
BRR ADH 4X3 ABS CNTRL BYND (GAUZE/BANDAGES/DRESSINGS)
CATH ROBINSON RED A/P 14FR (CATHETERS) ×1 IMPLANT
COVER MAYO STAND STRL (DRAPES) ×1 IMPLANT
DERMABOND ADVANCED .7 DNX12 (GAUZE/BANDAGES/DRESSINGS) IMPLANT
DRAPE SURG IRRIG POUCH 19X23 (DRAPES) ×1 IMPLANT
DURAPREP 26ML APPLICATOR (WOUND CARE) ×1 IMPLANT
ELECT REM PT RETURN 9FT ADLT (ELECTROSURGICAL) ×1
ELECTRODE REM PT RTRN 9FT ADLT (ELECTROSURGICAL) ×1 IMPLANT
FORCEPS CUTTING 45CM 5MM (CUTTING FORCEPS) IMPLANT
GAUZE 4X4 16PLY ~~LOC~~+RFID DBL (SPONGE) IMPLANT
GLOVE BIOGEL PI IND STRL 6 (GLOVE) ×2 IMPLANT
GLOVE SS PI 5.5 STRL (GLOVE) ×1 IMPLANT
GOWN STRL REUS W/TWL LRG LVL3 (GOWN DISPOSABLE) ×1 IMPLANT
IRRIG SUCT STRYKERFLOW 2 WTIP (MISCELLANEOUS)
IRRIGATION SUCT STRKRFLW 2 WTP (MISCELLANEOUS) ×1 IMPLANT
KIT PINK PAD W/HEAD ARE REST (MISCELLANEOUS) ×1
KIT PINK PAD W/HEAD ARM REST (MISCELLANEOUS) ×2 IMPLANT
KIT TURNOVER CYSTO (KITS) ×1 IMPLANT
LIGASURE LAP L-HOOKWIRE 5 44CM (INSTRUMENTS) IMPLANT
NDL INSUFFLATION 14GA 120MM (NEEDLE) ×1 IMPLANT
NEEDLE INSUFFLATION 14GA 120MM (NEEDLE) ×1 IMPLANT
NS IRRIG 500ML POUR BTL (IV SOLUTION) ×1 IMPLANT
PACK LAPAROSCOPY BASIN (CUSTOM PROCEDURE TRAY) ×1 IMPLANT
PAD OB MATERNITY 4.3X12.25 (PERSONAL CARE ITEMS) ×1 IMPLANT
PENCIL SMOKE EVACUATOR (MISCELLANEOUS) IMPLANT
SCISSORS LAP 5X45 EPIX DISP (ENDOMECHANICALS) ×1 IMPLANT
SEALER TISSUE G2 CVD JAW 45CM (ENDOMECHANICALS) IMPLANT
SET TUBE SMOKE EVAC HIGH FLOW (TUBING) ×1 IMPLANT
SLEEVE SCD COMPRESS KNEE MED (STOCKING) ×1 IMPLANT
STRIP CLOSURE SKIN 1/2X4 (GAUZE/BANDAGES/DRESSINGS) IMPLANT
SUT MNCRL AB 3-0 PS2 18 (SUTURE) ×1 IMPLANT
SUT VICRYL 0 UR6 27IN ABS (SUTURE) ×1 IMPLANT
SYR 20ML LL LF (SYRINGE) IMPLANT
SYS BAG RETRIEVAL 10MM (BASKET) ×1
SYSTEM BAG RETRIEVAL 10MM (BASKET) IMPLANT
TOWEL OR 17X24 6PK STRL BLUE (TOWEL DISPOSABLE) ×1 IMPLANT
TROCAR Z-THREAD FIOS 11X100 BL (TROCAR) ×1 IMPLANT
TROCAR Z-THREAD FIOS 5X100MM (TROCAR) ×1 IMPLANT
TUBING EXTENTION W/L.L. (IV SETS) IMPLANT
WARMER LAPAROSCOPE (MISCELLANEOUS) ×1 IMPLANT
WATER STERILE IRR 500ML POUR (IV SOLUTION) ×1 IMPLANT

## 2023-08-24 NOTE — Op Note (Signed)
Kristina Nicholson 08/24/2023  PREOPERATIVE DIAGNOSIS:  Metastatic breast cancer  POSTOPERATIVE DIAGNOSIS:  Metastatic breast cancer  PROCEDURE:  Laparoscopic Bilateral Sapingo-oophorectomy  ANESTHESIA:  General endotracheal  COMPLICATIONS:  None immediate.  ESTIMATED BLOOD LOSS:  Less than 20 ml.  INDICATIONS: 57 y.o.  with metastatic breast cancer.  Oncologist recommended BSO to improve treatment effectiveness as patient is pre-menopausal with FSH 10.9.    FINDINGS:  Uterus with pedunculated right fundal fibroid, bilateral normal fallopian tubes and ovaries  TECHNIQUE:  The patient was taken to the operating room where general anesthesia was obtained without difficulty.  She was then placed in the dorsal lithotomy position and prepared and draped in sterile fashion.  After an adequate timeout was performed, a bivalved speculum was then placed in the patient's vagina, and the anterior lip of cervix grasped with the single-tooth tenaculum.  The acorn manipulator was advanced into the uterus.  The speculum was removed from the vagina.  Attention was then turned to the patient's abdomen where a 10-mm skin incision was made on the umbilical fold.  The Veress needle was carefully introduced into the peritoneal cavity through the abdominal wall.  Intraperitoneal placement was confirmed by drop in intraabdominal pressure with insufflation of carbon dioxide gas.  Adequate pneumoperitoneum was obtained, and the 11 XL trocar was then advanced under direct visualization without difficulty into the abdomen.  Survey revealed the above dictated findings.  5 mm skin incision was made in suprapubic region and trocar was inserted under visualization of the scope.  The right fallopian tube was freed from the mesosalpinx using Ligasure and then from the cornual region of the uterus.  This was removed from the infraumbilical port and sent to pathology.  The right fallopian tube was freed from the IP and  utero-ovarian ligament again using the Ligasure.  Hemostasis was observed.  The ovary was placed in the anterior space of Retzius to hold until removal.  The left adnexa was treated in the same fashion.  Bilateral ovaries were removed from the pelvis using Endocatch bag.  Hemostasis was again observed and bilateral ureteral peristalsis was observed.  All instruments were removed from the abdomen.  Infraumbilical fascia was closed with figure of eight 0 Vicryl stitch.  Both skin incisions were closed using 3-0 monocryl in a subcuticular fashion.  Dermabond was applied to both skin incisions.    The uterine manipulator and the tenaculum were removed from the vagina without complications. The patient tolerated the procedure well.  Sponge, lap, and needle counts were correct times two.  The patient was then taken to the recovery room awake, extubated and in stable  in stable condition.

## 2023-08-24 NOTE — Progress Notes (Signed)
No change to H&P.  Orena Cavazos, DO 

## 2023-08-24 NOTE — Discharge Instructions (Addendum)
Call MD for T>100.4, heavy vaginal bleeding, severe abdominal pain, intractable nausea and/or vomiting, or respiratory distress.  Call office to schedule postop appointment in 2 weeks.  Pelvic rest x 4 weeks.  No driving while taking narcotics. Do NOT Take Motrin until after 3p today. Do NOT Take Tylenol until after 1p today.   Post Anesthesia Home Care Instructions  Activity: Get plenty of rest for the remainder of the day. A responsible adult should stay with you for 24 hours following the procedure.  For the next 24 hours, DO NOT: -Drive a car -Advertising copywriter -Drink alcoholic beverages -Take any medication unless instructed by your physician -Make any legal decisions or sign important papers.  Meals: Start with liquid foods such as gelatin or soup. Progress to regular foods as tolerated. Avoid greasy, spicy, heavy foods. If nausea and/or vomiting occur, drink only clear liquids until the nausea and/or vomiting subsides. Call your physician if vomiting continues.  Special Instructions/Symptoms: Your throat may feel dry or sore from the anesthesia or the breathing tube placed in your throat during surgery. If this causes discomfort, gargle with warm salt water. The discomfort should disappear within 24 hours.

## 2023-08-24 NOTE — Transfer of Care (Signed)
Immediate Anesthesia Transfer of Care Note  Patient: Kristina Nicholson  Procedure(s) Performed: Procedure(s) (LRB): LAPAROSCOPIC SALPINGO OOPHORECTOMY (Bilateral)  Patient Location: PACU  Anesthesia Type: General  Level of Consciousness: awake, oriented, sedated and patient cooperative  Airway & Oxygen Therapy: Patient Spontanous Breathing and Patient connected to face mask oxygen  Post-op Assessment: Report given to PACU RN and Post -op Vital signs reviewed and stable  Post vital signs: Reviewed and stable  Complications: No apparent anesthesia complications Last Vitals:  Vitals Value Taken Time  BP 133/77 08/24/23 0902  Temp    Pulse 69 08/24/23 0904  Resp 15 08/24/23 0904  SpO2 98 % 08/24/23 0904  Vitals shown include unfiled device data.  Last Pain:  Vitals:   08/24/23 0627  TempSrc: Oral         Complications: No notable events documented.

## 2023-08-24 NOTE — Anesthesia Procedure Notes (Signed)
Procedure Name: Intubation Date/Time: 08/24/2023 7:40 AM  Performed by: Francie Massing, CRNAPre-anesthesia Checklist: Patient identified, Emergency Drugs available, Suction available and Patient being monitored Patient Re-evaluated:Patient Re-evaluated prior to induction Oxygen Delivery Method: Circle system utilized Preoxygenation: Pre-oxygenation with 100% oxygen Induction Type: IV induction Ventilation: Mask ventilation without difficulty Laryngoscope Size: Mac and 3 Grade View: Grade II Tube type: Oral Tube size: 7.5 mm Number of attempts: 1 Airway Equipment and Method: Stylet and Oral airway Placement Confirmation: ETT inserted through vocal cords under direct vision, positive ETCO2 and breath sounds checked- equal and bilateral Secured at: 22 cm Tube secured with: Tape Dental Injury: Teeth and Oropharynx as per pre-operative assessment

## 2023-08-24 NOTE — Anesthesia Postprocedure Evaluation (Signed)
Anesthesia Post Note  Patient: Kristina Nicholson  Procedure(s) Performed: LAPAROSCOPIC SALPINGO OOPHORECTOMY (Bilateral: Abdomen)     Patient location during evaluation: PACU Anesthesia Type: General Level of consciousness: awake Pain management: pain level controlled Vital Signs Assessment: post-procedure vital signs reviewed and stable Respiratory status: spontaneous breathing, nonlabored ventilation and respiratory function stable Cardiovascular status: blood pressure returned to baseline and stable Postop Assessment: no apparent nausea or vomiting Anesthetic complications: no   No notable events documented.  Last Vitals:  Vitals:   08/24/23 0930 08/24/23 0958  BP: 124/73 115/67  Pulse: 61 (!) 55  Resp: 11 13  Temp:  36.4 C  SpO2: 97% 100%    Last Pain:  Vitals:   08/24/23 1008  TempSrc:   PainSc: 5                  Linton Rump

## 2023-08-27 ENCOUNTER — Encounter (HOSPITAL_BASED_OUTPATIENT_CLINIC_OR_DEPARTMENT_OTHER): Payer: Self-pay | Admitting: Obstetrics & Gynecology

## 2023-08-27 LAB — SURGICAL PATHOLOGY

## 2023-08-30 ENCOUNTER — Other Ambulatory Visit: Payer: BC Managed Care – PPO

## 2023-08-30 ENCOUNTER — Ambulatory Visit: Payer: BC Managed Care – PPO

## 2023-08-30 ENCOUNTER — Ambulatory Visit: Payer: BC Managed Care – PPO | Admitting: Hematology

## 2023-09-03 ENCOUNTER — Other Ambulatory Visit: Payer: Self-pay

## 2023-09-03 ENCOUNTER — Inpatient Hospital Stay: Payer: BC Managed Care – PPO

## 2023-09-03 ENCOUNTER — Inpatient Hospital Stay (HOSPITAL_BASED_OUTPATIENT_CLINIC_OR_DEPARTMENT_OTHER): Payer: BC Managed Care – PPO | Admitting: Hematology

## 2023-09-03 ENCOUNTER — Inpatient Hospital Stay: Payer: BC Managed Care – PPO | Attending: Hematology

## 2023-09-03 ENCOUNTER — Encounter: Payer: Self-pay | Admitting: Hematology

## 2023-09-03 VITALS — BP 111/73 | HR 75 | Temp 98.2°F | Resp 15 | Ht 63.0 in | Wt 129.9 lb

## 2023-09-03 DIAGNOSIS — C50212 Malignant neoplasm of upper-inner quadrant of left female breast: Secondary | ICD-10-CM | POA: Diagnosis not present

## 2023-09-03 DIAGNOSIS — Z9221 Personal history of antineoplastic chemotherapy: Secondary | ICD-10-CM | POA: Insufficient documentation

## 2023-09-03 DIAGNOSIS — C7951 Secondary malignant neoplasm of bone: Secondary | ICD-10-CM | POA: Insufficient documentation

## 2023-09-03 DIAGNOSIS — Z79811 Long term (current) use of aromatase inhibitors: Secondary | ICD-10-CM | POA: Diagnosis not present

## 2023-09-03 DIAGNOSIS — Z79899 Other long term (current) drug therapy: Secondary | ICD-10-CM | POA: Diagnosis not present

## 2023-09-03 DIAGNOSIS — Z170 Estrogen receptor positive status [ER+]: Secondary | ICD-10-CM | POA: Diagnosis not present

## 2023-09-03 DIAGNOSIS — Z9012 Acquired absence of left breast and nipple: Secondary | ICD-10-CM | POA: Diagnosis not present

## 2023-09-03 DIAGNOSIS — Z923 Personal history of irradiation: Secondary | ICD-10-CM | POA: Diagnosis not present

## 2023-09-03 LAB — CBC WITH DIFFERENTIAL (CANCER CENTER ONLY)
Abs Immature Granulocytes: 0.01 10*3/uL (ref 0.00–0.07)
Basophils Absolute: 0.1 10*3/uL (ref 0.0–0.1)
Basophils Relative: 1 %
Eosinophils Absolute: 0.1 10*3/uL (ref 0.0–0.5)
Eosinophils Relative: 4 %
HCT: 33.3 % — ABNORMAL LOW (ref 36.0–46.0)
Hemoglobin: 11.6 g/dL — ABNORMAL LOW (ref 12.0–15.0)
Immature Granulocytes: 0 %
Lymphocytes Relative: 32 %
Lymphs Abs: 1.2 10*3/uL (ref 0.7–4.0)
MCH: 33 pg (ref 26.0–34.0)
MCHC: 34.8 g/dL (ref 30.0–36.0)
MCV: 94.9 fL (ref 80.0–100.0)
Monocytes Absolute: 0.3 10*3/uL (ref 0.1–1.0)
Monocytes Relative: 7 %
Neutro Abs: 2.2 10*3/uL (ref 1.7–7.7)
Neutrophils Relative %: 56 %
Platelet Count: 280 10*3/uL (ref 150–400)
RBC: 3.51 MIL/uL — ABNORMAL LOW (ref 3.87–5.11)
RDW: 15.8 % — ABNORMAL HIGH (ref 11.5–15.5)
WBC Count: 3.9 10*3/uL — ABNORMAL LOW (ref 4.0–10.5)
nRBC: 0 % (ref 0.0–0.2)

## 2023-09-03 LAB — CMP (CANCER CENTER ONLY)
ALT: 17 U/L (ref 0–44)
AST: 15 U/L (ref 15–41)
Albumin: 4.1 g/dL (ref 3.5–5.0)
Alkaline Phosphatase: 45 U/L (ref 38–126)
Anion gap: 8 (ref 5–15)
BUN: 16 mg/dL (ref 6–20)
CO2: 25 mmol/L (ref 22–32)
Calcium: 9.5 mg/dL (ref 8.9–10.3)
Chloride: 108 mmol/L (ref 98–111)
Creatinine: 0.92 mg/dL (ref 0.44–1.00)
GFR, Estimated: >60 mL/min (ref >60)
Glucose, Bld: 95 mg/dL (ref 70–99)
Potassium: 3.9 mmol/L (ref 3.5–5.1)
Sodium: 141 mmol/L (ref 135–145)
Total Bilirubin: 0.4 mg/dL (ref 0.3–1.2)
Total Protein: 7 g/dL (ref 6.5–8.1)

## 2023-09-03 MED ORDER — GABAPENTIN 100 MG PO CAPS: 100.0000 mg | ORAL_CAPSULE | Freq: Every day | ORAL | 0 refills | Status: DC

## 2023-09-03 MED ORDER — FULVESTRANT 250 MG/5ML IM SOSY
500.0000 mg | PREFILLED_SYRINGE | Freq: Once | INTRAMUSCULAR | Status: AC
  Administered 2023-09-03: 500 mg via INTRAMUSCULAR
  Filled 2023-09-03: qty 10

## 2023-09-03 MED ORDER — LETROZOLE 2.5 MG PO TABS: 2.5000 mg | ORAL_TABLET | Freq: Every day | ORAL | 1 refills | Status: DC

## 2023-09-03 NOTE — Patient Instructions (Signed)

## 2023-09-03 NOTE — Assessment & Plan Note (Signed)
[name], stage IIB, ER: Positive, PR: Negative, HER2: Negative, Grade 3, bone metastasis in 05/2023 -She was diagnosed in 04/2017. She is s/p left breast mastectomy and adjuvant radiation. Mammaprint showed low risk. -She started antiestrogen therapy with Tamoxifen in 10/2017. She is tolerating well, plan for 10 year. She has not had a menstrual period since 2018.  Her FSH in March 2021 showed that she is still premenopausal --Due to worsening left shoulder pain, she underwent CT and bone scan in [place] during her trip. Unfortunately scan showed multiple bone lesion and uptake in sternum, cervical spine, left scapular, left 10th rib, and left hip, concerning for bone mets. She underwent bone biopsy of left scapula and sternum, both confirmed metastatic breast cancer, ER positive (weak to moderate in left scapula biopsy, and 80% strong positive in sternal biopsy), PR weak to moderate positive, HER2 2+ in left scapular, 1+ in sternal bone biopsy -PET scan from June 21, 2023 showed multifocal hypermetabolic osseous metastatic disease. No definite pathologic fracture or epidural tumor identified.  No visceral metastasis.   -she started fulvestrant injection and Verzenio on August 13, 2023.  Due to poor tolerance to Verzenio, dose was reduced to 100 mg twice daily -She underwent BSO on August 24, 2023, surgical path was benign.

## 2023-09-03 NOTE — Progress Notes (Signed)
[facility]   [phone] [phone]   Clinic Follow up Note   Patient Care Team: Patient, No Pcp Per as PCP - General (General Practice) Malachy Mood, MD as Consulting Physician (Hematology) Griselda Miner, MD as Consulting Physician (General Surgery) Lonie Peak, MD as Attending Physician (Radiation Oncology)  Date of Service:  09/03/2023  CHIEF COMPLAINT: f/u of metastatic breast cancer  CURRENT THERAPY:  Fulvestrant and Verzenio Plan to change fulvestrant to letrozole in 4 weeks  Oncology History   Malignant neoplasm of upper-inner quadrant of left breast in female, estrogen receptor positive (HCC) [name], stage IIB, ER: Positive, PR: Negative, HER2: Negative, Grade 3, bone metastasis in 05/2023 -She was diagnosed in 04/2017. She is s/p left breast mastectomy and adjuvant radiation. Mammaprint showed low risk. -She started antiestrogen therapy with Tamoxifen in 10/2017. She is tolerating well, plan for 10 year. She has not had a menstrual period since 2018.  Her FSH in March 2021 showed that she is still premenopausal --Due to worsening left shoulder pain, she underwent CT and bone scan in [place] during her trip. Unfortunately scan showed multiple bone lesion and uptake in sternum, cervical spine, left scapular, left 10th rib, and left hip, concerning for bone mets. She underwent bone biopsy of left scapula and sternum, both confirmed metastatic breast cancer, ER positive (weak to moderate in left scapula biopsy, and 80% strong positive in sternal biopsy), PR weak to moderate positive, HER2 2+ in left scapular, 1+ in sternal bone biopsy -PET scan from June 21, 2023 showed multifocal hypermetabolic osseous metastatic disease. No definite pathologic fracture or epidural tumor identified.  No visceral metastasis.   -she started fulvestrant injection and Verzenio on August 13, 2023.  Due to poor tolerance to Verzenio, dose was reduced to 100 mg twice  daily -She underwent BSO on August 24, 2023, surgical path was benign.    Assessment and Plan    Metastatic Breast Cancer Patient is on Visenya and Fulvestrant injections. Recent BSO surgery with benign results. Persistent shoulder pain. Discussed switching from Fulvestrant injections to oral Letrozole in 4 weeks. -Continue Verzenio at a decreased dose to 100 mg twice daily due to diarrhea -Start Letrozole in 4 weeks. -Order PET scan in 5 weeks. -Refer to Dr. Basilio Cairo for radiation therapy consultation for her left shoulder pain from her scapular bone metastasis. -Continue Zometa infusion every 3 months, next due in December  Left shoulder and shoulder blade pain -Secondary to bone metastasis -Continue ibuprofen as needed for pain. -Start Gabapentin at bedtime, with potential to increase dose as needed for pain control. -She does not want narcotics at this point. -Referral to rad unk for palliative radiation  Plan -Will proceed fulvestrant injection today, and changed to letrozole in 4 weeks, I called in letrozole today. -Refer her to radiation oncologist Dr. Basilio Cairo for palliative radiation -Return for follow-up in 6 weeks with lab and PET scan 1 week before -I called in gabapentin 100 mg at night for her left shoulder pain, dose titrated as needed     SUMMARY OF ONCOLOGIC HISTORY: Oncology History Overview Note  Cancer Staging Malignant neoplasm of upper-inner quadrant of left breast in female, estrogen receptor positive (HCC) Staging form: Breast, AJCC 8th Edition - Clinical stage from 04/13/2017: Stage IIA (cT2, cN0, cM0, G2, ER: Positive, PR: Negative, HER2: Negative) - Signed by Malachy Mood, MD on 04/25/2017 - Pathologic stage from 06/13/2017: Stage IIB (pT2, pN1a, cM0, G1, ER: Positive, PR: Negative, HER2: Negative) - Signed  by Malachy Mood, MD on 09/04/2017     Malignant neoplasm of upper-inner quadrant of left breast in female, estrogen receptor positive (HCC)  04/10/2017  Mammogram   Left breast mammo and US showed an irregular hypoechoic shadowing mass at 9 o'clock 1 cm from the nipple measuring 2.3 x 1 x 2.4 cm. This corresponds well with the mass seen at mammography. No lymphadenopathy seen in the left axilla.   04/18/2017 Initial Diagnosis   Malignant neoplasm of upper-inner quadrant of left breast in female, estrogen receptor positive (HCC)   04/22/2017 Receptors her2   Estrogen Receptor: 95%, POSITIVE, STRONG STAINING INTENSITY Progesterone Receptor: 0%, NEGATIVE Proliferation Marker Ki67: 10% HER2 -   04/22/2017 Initial Biopsy   Breast, left, needle core biopsy, 9:30 o'clock - INVASIVE DUCTAL CARCINOMA, G2 - DUCTAL CARCINOMA IN SITU.   06/13/2017 Surgery   Left breast mastectomy with sentinel lymph node biopsy performed by Dr Carolynne Edouard.    06/13/2017 Pathology Results   Breast, simple mastectomy, Left - INVASIVE DUCTAL CARCINOMA, GRADE I/III, SPANNING 2.5 CM. - DUCTAL CARCINOMA IN SITU, INTERMEDIATE GRADE. - LOBULAR NEOPLASIA (ATYPICAL LOBULAR HYPERPLASIA). - THE SURGICAL RESECTION MARGINS ARE NEGATIVE FOR CARCINOMA.  Lymph node, sentinel, biopsy, Left axillary #1 - METASTATIC CARCINOMA IN 1 OF 3 LYMPH NODE (1/3), WITH EXTRACAPSULAR EXTENSION.   06/13/2017 Pathology Results   Mammaprint with low risk disease.    07/31/2017 - 09/10/2017 Radiation Therapy   The patient began a course of radiotherapy over the left chest wall and axillary area, supervised by Dr Lonie Peak, MD.     Radiation treatment dates:   07/31/2017 - 09/10/2017   Site/dose:    1) Left Chest Wall / 50 Gy in 25 fractions 2) Left Supraclavicular / 45 Gy in 25 fractions 3) Left Chest Wall Scar Boost / 10 Gy in 5 fractions   Beams/energy:    1) Opposed tangents 3D/ 10 and 6 MV photons 2) Additional fields 3D / 10 MV and 6 MV photons 3) En face electrons / 6 MeV electrons   Narrative: The patient tolerated radiation treatment relatively well. She experienced mild fatigue. She  reported pain, burning, and itching to the left breast and swelling under the left arm. On physical exam, she had dry peeling in the left UIQ of the breast and left axilla, as well as diffuse hyperpigmentation and erythema of the left chest wall. She is using the radiaplex cream to the treatment area and neosporin to the peeling areas.   10/13/2017 -  Anti-estrogen oral therapy   Tamoxifen 10mg  daily   04/11/2018 Mammogram   04/11/2018 Mammogram IMPRESSION: No mammographic evidence of malignancy. A result letter of this screening mammogram will be mailed directly to the patient.   04/11/2018 Mammogram   Screening Unilateral Right: No evidence of mammographic malignancy.       Discussed the use of AI scribe software for clinical note transcription with the patient, who gave verbal consent to proceed.  History of Present Illness   A 57 year old female with a history of metastatic breast cancer presents for follow-up. She recently underwent surgery for removal of her ovaries and tubes, which was performed about ten days ago. She reports persistent pain and soreness at the surgical site. She also complains of significant shoulder pain, which has not improved despite the use of ibuprofen and pain medication prescribed post-surgery.  The patient has been on Verzenio and Fulvestrant injections for the past two months. However, she reports no significant improvement in her condition. She  also experienced a flu-like reaction to her first Zometa infusion, which was administered about a month ago.  The patient's recent surgical pathology report showed benign findings, indicating no cancer in the removed ovaries and tubes. Despite this, she continues to experience pain and discomfort, which is affecting her mood and sleep.         All other systems were reviewed with the patient and are negative.  MEDICAL HISTORY:  Past Medical History:  Diagnosis Date   Chronic left shoulder pain    Headache     History of external beam radiation therapy    07-31-2017  to  09-10-2017  LEft chest wass 50 gy in 25 fractions, Left Supraclavicular 45 Gy in 25 fractions, Left Chest wall scar boost 10 Gy in 5 fractions.   Horseshoe kidney    Malignant neoplasm of upper-inner quadrant of left breast in female, estrogen receptor positive (HCC) 04/18/2017   oncologist--- dr Mosetta Putt;   dx 06/ 2018 ,  Stage IIB, ER/PR+, G3, IDC/ DCIS;  06-13-2017 s/p left mastectomy w/ node dissection ;  completed radiation 09-10-2017;   recurrence w/ mets to bone 07/ 2024 , started chemotherapy   Metastatic cancer to bone (HCC) 05/2023   Uterine fibroid     SURGICAL HISTORY: Past Surgical History:  Procedure Laterality Date   APPENDECTOMY  2005   COLONOSCOPY  2016   GANGLION CYST EXCISION Right 09/24/2017   Procedure: EXCISION OF RIGHT DORSAL WRIST GANGLION;  Surgeon: Mack Hook, MD;  Location: [facility];  Service: Orthopedics;  Laterality: Right;   LAPAROSCOPIC SALPINGO OOPHERECTOMY Bilateral 08/24/2023   Procedure: LAPAROSCOPIC SALPINGO OOPHORECTOMY;  Surgeon: [name], DO;  Location: [facility];  Service: Gynecology;  Laterality: Bilateral;   LIPOMA EXCISION Left 06/13/2017   Procedure: EXCISION 3CM LIPOMA ON LEFT ARM;  Surgeon: Griselda Miner, MD;  Location: [facility];  Service: General;  Laterality: Left;   MASTECTOMY W/ SENTINEL NODE BIOPSY Left 06/13/2017   Procedure: MASTECTOMY WITH SENTINEL LYMPH NODE BIOPSY;  Surgeon: Griselda Miner, MD;  Location: [facility] OR;  Service: General;  Laterality: Left;    I have reviewed the social history and family history with the patient and they are unchanged from previous note.  ALLERGIES:  has No Known Allergies.  MEDICATIONS:  Current Outpatient Medications  Medication Sig Dispense Refill   gabapentin (NEURONTIN) 100 MG capsule Take 1 capsule (100 mg total) by mouth at bedtime. If tolerates well, OK to increase to 2 or 3 cap at night as needed  60 capsule 0   letrozole (FEMARA) 2.5 MG tablet Take 1 tablet (2.5 mg total) by mouth daily. 30 tablet 1   abemaciclib (VERZENIO) 100 MG tablet Take 1 tablet (100 mg total) by mouth 2 (two) times daily. 60 tablet 1   ibuprofen (ADVIL) 600 MG tablet Take 1 tablet (600 mg total) by mouth every 6 (six) hours as needed. 30 tablet 0   loperamide (IMODIUM A-D) 2 MG tablet Take 2 mg by mouth 4 (four) times daily as needed for diarrhea or loose stools.     Multiple Vitamins-Minerals (MULTIVITAMIN WOMEN PO) Take by mouth daily.     Nutritional Supplements (CHLORELLA-SPIRULINA COMPLEX PO) Take by mouth as needed.     No current facility-administered medications for this visit.    PHYSICAL EXAMINATION: ECOG PERFORMANCE STATUS: 1 - Symptomatic but completely ambulatory  Vitals:   09/03/23 1447  BP: 111/73  Pulse: 75  Resp: 15  Temp: 98.2 F (36.8  C)  SpO2: 98%   Wt Readings from Last 3 Encounters:  09/03/23 129 lb 14.4 oz (58.9 kg)  08/24/23 120 lb 14.4 oz (54.8 kg)  08/01/23 124 lb 4.8 oz (56.4 kg)     GENERAL:alert, no distress and comfortable SKIN: skin color, texture, turgor are normal, no rashes or significant lesions EYES: normal, Conjunctiva are pink and non-injected, sclera clear NECK: supple, thyroid normal size, non-tender, without nodularity LYMPH:  no palpable lymphadenopathy in the cervical, axillary  LUNGS: clear to auscultation and percussion with normal breathing effort HEART: regular rate & rhythm and no murmurs and no lower extremity edema ABDOMEN:abdomen soft, non-tender and normal bowel sounds, recent to laparoscopic incisions have healed well. Musculoskeletal:no cyanosis of digits and no clubbing  NEURO: alert & oriented x 3 with fluent speech, no focal motor/sensory deficits   LABORATORY DATA:  I have reviewed the data as listed    Latest Ref Rng & Units 09/03/2023    2:23 PM 08/24/2023    6:55 AM 08/01/2023    8:02 AM  CBC  WBC 4.0 - 10.5 K/uL 3.9  3.7  3.5    Hemoglobin 12.0 - 15.0 g/dL 95.2  84.1  32.4   Hematocrit 36.0 - 46.0 % 33.3  34.7  34.0   Platelets 150 - 400 K/uL 280  282  197         Latest Ref Rng & Units 09/03/2023    2:23 PM 08/01/2023    8:02 AM 07/17/2023    8:54 AM  CMP  Glucose 70 - 99 mg/dL 95  78  401   BUN 6 - 20 mg/dL 16  12  11    Creatinine 0.44 - 1.00 mg/dL 0.27  2.53  6.64   Sodium 135 - 145 mmol/L 141  141  139   Potassium 3.5 - 5.1 mmol/L 3.9  3.7  3.7   Chloride 98 - 111 mmol/L 108  107  106   CO2 22 - 32 mmol/L 25  29  27    Calcium 8.9 - 10.3 mg/dL 9.5  9.3  9.0   Total Protein 6.5 - 8.1 g/dL 7.0  7.0  6.6   Total Bilirubin 0.3 - 1.2 mg/dL 0.4  0.4  0.4   Alkaline Phos 38 - 126 U/L 45  53  52   AST 15 - 41 U/L 15  12  13    ALT 0 - 44 U/L 17  10  10        RADIOGRAPHIC STUDIES: I have personally reviewed the radiological images as listed and agreed with the findings in the report. No results found.    Orders Placed This Encounter  Procedures   NM PET Image Restag (PS) Skull Base To Thigh    Standing Status:   Future    Standing Expiration Date:   09/02/2024    Order Specific Question:   If indicated for the ordered procedure, I authorize the administration of a radiopharmaceutical per Radiology protocol    Answer:   Yes    Order Specific Question:   Is the patient pregnant?    Answer:   No    Order Specific Question:   Preferred imaging location?    Answer:   [facility]   Ambulatory referral to Radiation Oncology    Referral Priority:   Routine    Referral Type:   Consultation    Referral Reason:   Specialty Services Required    Requested Specialty:   Radiation Oncology  Number of Visits Requested:   1   Patient and her husband had multiple questions.  All questions were answered. The patient knows to call the clinic with any problems, questions or concerns. No barriers to learning was detected. The total time spent in the appointment was 40 minutes.     Malachy Mood, MD 09/03/2023

## 2023-09-04 ENCOUNTER — Telehealth: Payer: Self-pay | Admitting: Hematology

## 2023-09-04 LAB — CANCER ANTIGEN 27.29: CA 27.29: 49.5 U/mL — ABNORMAL HIGH (ref 0.0–38.6)

## 2023-09-05 NOTE — Progress Notes (Signed)
Histology and Location of Primary Cancer:  06-13-2017   Location(s) of Symptomatic tumor(s):  NM PET Image Initial (PI) Skull Base To Thigh 06/19/2023  IMPRESSION: 1. Multifocal hypermetabolic osseous metastatic disease as described. No definite pathologic fracture or epidural tumor identified. There are lesions within the left humeral head and left scapula which may contribute to the patient's left shoulder pain. 2. No other evidence of metastatic disease. 3. Probable hepatic cysts. 4. Horseshoe kidney and uterine fibroids.   Past/Anticipated chemotherapy by medical oncology, if any:  Malachy Mood, MD 09/03/2023  Oncology History   Malignant neoplasm of upper-inner quadrant of left breast in female, estrogen receptor positive (HCC) [name], stage IIB, ER: Positive, PR: Negative, HER2: Negative, Grade 3, bone metastasis in 05/2023 -She was diagnosed in 04/2017. She is s/p left breast mastectomy and adjuvant radiation. Mammaprint showed low risk. -She started antiestrogen therapy with Tamoxifen in 10/2017. She is tolerating well, plan for 10 year. She has not had a menstrual period since 2018.  Her FSH in March 2021 showed that she is still premenopausal --Due to worsening left shoulder pain, she underwent CT and bone scan in [place] during her trip. Unfortunately scan showed multiple bone lesion and uptake in sternum, cervical spine, left scapular, left 10th rib, and left hip, concerning for bone mets. She underwent bone biopsy of left scapula and sternum, both confirmed metastatic breast cancer, ER positive (weak to moderate in left scapula biopsy, and 80% strong positive in sternal biopsy), PR weak to moderate positive, HER2 2+ in left scapular, 1+ in sternal bone biopsy -PET scan from June 21, 2023 showed multifocal hypermetabolic osseous metastatic disease. No definite pathologic fracture or epidural tumor identified.  No visceral metastasis.   -she started fulvestrant injection and Verzenio on  August 13, 2023.  Due to poor tolerance to Verzenio, dose was reduced to 100 mg twice daily -She underwent BSO on August 24, 2023, surgical path was benign.  Assessment and Plan    Metastatic Breast Cancer Patient is on Visenya and Fulvestrant injections. Recent BSO surgery with benign results. Persistent shoulder pain. Discussed switching from Fulvestrant injections to oral Letrozole in 4 weeks. -Continue Verzenio at a decreased dose to 100 mg twice daily due to diarrhea -Start Letrozole in 4 weeks. -Order PET scan in 5 weeks. -Refer to Dr. Basilio Cairo for radiation therapy consultation for her left shoulder pain from her scapular bone metastasis. -Continue Zometa infusion every 3 months, next due in December   Left shoulder and shoulder blade pain -Secondary to bone metastasis -Continue ibuprofen as needed for pain. -Start Gabapentin at bedtime, with potential to increase dose as needed for pain control. -She does not want narcotics at this point. -Referral to rad unk for palliative radiation   Plan -Will proceed fulvestrant injection today, and changed to letrozole in 4 weeks, I called in letrozole today. -Refer her to radiation oncologist Dr. Basilio Cairo for palliative radiation -Return for follow-up in 6 weeks with lab and PET scan 1 week before -I called in gabapentin 100 mg at night for her left shoulder pain, dose titrated as needed  Patient's main complaints related to symptomatic tumor(s) are:  Due to worsening left shoulder pain, she underwent CT and bone scan in [place] during her trip. Unfortunately scan showed multiple bone lesion and uptake in sternum, cervical spine, left scapular, left 10th rib, and left hip, concerning for bone mets.  Pain on a scale of 0-10 is: 6 to left shoulder.  Ambulatory status? Walker? Wheelchair?: ambulatory, reports  right leg numbness.   SAFETY ISSUES: Prior radiation?  Radiation Therapy    The patient began a course of radiotherapy over the  left chest wall and axillary area, supervised by Dr Lonie Peak, MD.      Radiation treatment dates:   07/31/2017 - 09/10/2017   Site/dose:    1) Left Chest Wall / 50 Gy in 25 fractions 2) Left Supraclavicular / 45 Gy in 25 fractions 3) Left Chest Wall Scar Boost / 10 Gy in 5 fractions   Beams/energy:    1) Opposed tangents 3D/ 10 and 6 MV photons 2) Additional fields 3D / 10 MV and 6 MV photons 3) En face electrons / 6 MeV electrons   Pacemaker/ICD? No Possible current pregnancy? no Is the patient on methotrexate? No   Additional Complaints / other details:  BP 103/64   Pulse 73   Temp 97.9 F (36.6 C)   Resp 18   Ht 5\' 3"  (1.6 m)   Wt 122 lb 3.2 oz (55.4 kg)   LMP 06/22/2015 Comment: 06/08/17 vaginal bleeding for 4 days  SpO2 100%   BMI 21.65 kg/m

## 2023-09-06 ENCOUNTER — Telehealth: Payer: Self-pay

## 2023-09-06 NOTE — Progress Notes (Signed)
Radiation Oncology         [phone] ________________________________  Initial Outpatient Consultation  Name: Kristina Nicholson MRN: [id number]  Date: 09/07/2023  DOB: January 07, 1966  AO:[id number], No Pcp Per  Malachy Mood, MD   REFERRING PHYSICIAN: Malachy Mood, MD  DIAGNOSIS:    ICD-10-CM   1. Metastasis to bone [facility])  C79.51      Wide spread osseous metastatic disease from a left breast cancer primary - painful left scapular lesion   Initially diagnosed with left breast cancer in June 2018 - Left Breast LIQ Invasive Ductal Carcinoma and DCIS, ER Positive / PR Negative / Her2 Negative, Grade 2: s/p left mastectomy, XRT, and antiestrogen therapy.    Cancer Staging  Malignant neoplasm of upper-inner quadrant of left breast in female, estrogen receptor positive (HCC) Staging form: Breast, AJCC 8th Edition - Clinical stage from 04/13/2017: Stage IIA (cT2, cN0, cM0, G2, ER+, PR-, HER2-) - Signed by Malachy Mood, MD on 04/25/2017 Nuclear grade: G2 Histologic grading system: 3 grade system Laterality: Left - Pathologic stage from 06/13/2017: Stage IIB (pT2, pN1a, cM0, G1, ER+, PR-, HER2-) - Signed by Malachy Mood, MD on 09/04/2017 Neoadjuvant therapy: No Nuclear grade: G1 Multigene prognostic tests performed: MammaPrint Histologic grading system: 3 grade system Residual tumor (R): R0 - None Laterality: Left   CHIEF COMPLAINT: Here to discuss management of bone mets from a left breast cancer primary (left scapula)   HISTORY OF PRESENT ILLNESS::Kristina Nicholson is a 57 y.o. female who is accompanied by her significant other; she is seen as a courtesy of Dr. Mosetta Putt for an opinion concerning radiation therapy as part of management for her recently diagnosed osseous metastatic disease from a left breast cancer primary.   To review, the patient is known to me for her history of left breast cancer (IDC) diagnosed in June of 2018. She was seen in the multidisciplinary breast clinic at that time and  opted to proceed with a left breast mastectomy and SLN evaluation on 06/13/2017 under the care of Dr. Carolynne Edouard. Final pathology showed grade 1 IDC with intermediate grade DCIS with one positive SLN. She was then treated with adjuvant radiation to the left chest wall and supraclavicular area which she completed on 09/10/2017. She was last seen here in December 2018 shortly after starting antiestrogen therapy (tamoxifen), and has continued to follow with Dr. Mosetta Putt for surveillance of her disease since that time.   In December of 2023, the patient presented to Dr. Mosetta Putt with c/o left shoulder pain x 2 months (starting in October). Her pain initially resulted in limited ROM. She was apparently seen by an orthopedic surgeon sometime prior to meeting with Dr. Mosetta Putt and received a steroid injection with improvement achieved.   She then went to [place] in April 2024 to visit her family. She developed worsening left shoulder pain at that time and had a CT scan and a bone scan performed in [place] which unfortunately revealed multiple bone lesions and uptake in sternum, cervical spine, left scapular, left 10th rib, and left hip, concerning for osseous metastatic disease.    She then underwent bone biopsies of left scapula and sternum (also performed in [place]) which both confirmed metastatic breast cancer. ER positive (weak to moderate in left scapula biopsy, and 80% strong positive in sternal biopsy), PR positive with weak to moderate positivity, HER2 2+ in left scapula and 1+ in sternal bone biopsy. (Pathology report is per Dr. Mosetta Putt. A copy of her pathology in Mandarin in available  and is located in the Media tab).   [place] states, she promptly met with Dr. Mosetta Putt on 06/01/23 to discuss these findings and for further management.   To further evaluate her prior CT findings, a PET scan was performed on 06/19/23 which showed multiple hypermetabolic osseous lesions consistent with metastatic disease, consisting of:  prominent lesions in the left humeral head (SUV max 10.9), the left scapular body (SUV max 8.7), within the C2 vertebral body (SUV max 5.7), the T12 vertebral body, and the right femoral head. PET otherwise showed no evidence of recurrent disease or metastatic disease in the chest wall or else where in the visualized body.   She was also referred to gynecology to determine her menopausal status for treatment planning. Labs came back showing that she was still peri-menopausal and a BSO was subsequently recommended. She underwent this procedure on 08/24/23. (Pathology showed no evidence of malignancy).   Based on Dr. Latanya Maudlin recommendation, she opted to proceed with fulvestrant and Verzenio on 08/13/23. She tolerated Verzenio poorly prompting a dose reduction to to 100 mg twice daily. Moving forward, fulvestrant will be transitioned to oral letrozole in approximately 4 weeks, and she will receive Zometa infusions every 3 months (next infusion will be given in December). She also has a restaging PET scan scheduled for 09/2523.   To help manage her significant disease related left shoulder pain, Dr. Mosetta Putt has recommended palliative radiation therapy for local pain control which we will discuss in detail today.  She reports pain in her left humeral head and her left scapula.  She denies sternal pain or other bone pain.  She reports that she has trouble raising her arm over her head because of the humeral head pain.  She is here with her supportive significant other   PREVIOUS RADIATION THERAPY: Yes - under the care of myself  Diagnosis:   57 y.o. female with Clinical Stage T2 N0 M0  Left Breast LIQ Invasive Ductal Carcinoma and DCIS, ER Positive / PR Negative / Her2 Negative, Grade 2, Pathologic Stage pT2 pN1a cM0 Indication for treatment:     Curative  Radiation treatment dates:   07/31/2017 - 09/10/2017 Site/dose:    1) Left Chest Wall / 50 Gy in 25 fractions 2) Left Supraclavicular / 45 Gy in 25  fractions 3) Left Chest Wall Scar Boost / 10 Gy in 5 fractions Beams/energy:    1) Opposed tangents 3D/ 10 and 6 MV photons 2) Additional fields 3D / 10 MV and 6 MV photons 3) En face electrons / 6 MeV electrons   PAST MEDICAL HISTORY:  has a past medical history of Chronic left shoulder pain, Headache, History of external beam radiation therapy, Horseshoe kidney, Malignant neoplasm of upper-inner quadrant of left breast in female, estrogen receptor positive (HCC) (04/18/2017), Metastatic cancer to bone (HCC) (05/2023), and Uterine fibroid.    PAST SURGICAL HISTORY: Past Surgical History:  Procedure Laterality Date   APPENDECTOMY  2005   COLONOSCOPY  2016   GANGLION CYST EXCISION Right 09/24/2017   Procedure: EXCISION OF RIGHT DORSAL WRIST GANGLION;  Surgeon: Mack Hook, MD;  Location: [facility];  Service: Orthopedics;  Laterality: Right;   LAPAROSCOPIC SALPINGO OOPHERECTOMY Bilateral 08/24/2023   Procedure: LAPAROSCOPIC SALPINGO OOPHORECTOMY;  Surgeon: [name], DO;  Location:  SURGERY CENTER;  Service: Gynecology;  Laterality: Bilateral;   LIPOMA EXCISION Left 06/13/2017   Procedure: EXCISION 3CM LIPOMA ON LEFT ARM;  Surgeon: Griselda Miner, MD;  [facility];  Service: General;  Laterality: Left;   MASTECTOMY W/ SENTINEL NODE BIOPSY Left 06/13/2017   Procedure: MASTECTOMY WITH SENTINEL LYMPH NODE BIOPSY;  Surgeon: Griselda Miner, MD;  Location: [facility] OR;  Service: General;  Laterality: Left;    FAMILY HISTORY: family history includes Colon cancer in her brother.  SOCIAL HISTORY:  reports that she has been smoking cigarettes. She started smoking about 38 years ago. She has a 8 pack-year smoking history. She has never used smokeless tobacco. She reports that she does not drink alcohol and does not use drugs.  ALLERGIES: Patient has no known allergies.  MEDICATIONS:  Current Outpatient Medications  Medication Sig Dispense Refill   abemaciclib  (VERZENIO) 100 MG tablet Take 1 tablet (100 mg total) by mouth 2 (two) times daily. 60 tablet 1   gabapentin (NEURONTIN) 100 MG capsule Take 1 capsule (100 mg total) by mouth at bedtime. If tolerates well, OK to increase to 2 or 3 cap at night as needed 60 capsule 0   ibuprofen (ADVIL) 600 MG tablet Take 1 tablet (600 mg total) by mouth every 6 (six) hours as needed. 30 tablet 0   loperamide (IMODIUM A-D) 2 MG tablet Take 2 mg by mouth 4 (four) times daily as needed for diarrhea or loose stools.     Multiple Vitamins-Minerals (MULTIVITAMIN WOMEN PO) Take by mouth daily.     Nutritional Supplements (CHLORELLA-SPIRULINA COMPLEX PO) Take by mouth as needed.     letrozole (FEMARA) 2.5 MG tablet Take 1 tablet (2.5 mg total) by mouth daily. (Patient taking differently: Take 2.5 mg by mouth daily. Patient to start 10/01/23) 30 tablet 1   No current facility-administered medications for this encounter.    REVIEW OF SYSTEMS:  Notable for that above.   PHYSICAL EXAM:  height is 5\' 3"  (1.6 m) and weight is 122 lb 3.2 oz (55.4 kg). Her temperature is 97.9 F (36.6 C). Her blood pressure is 103/64 and her pulse is 73. Her respiration is 18 and oxygen saturation is 100%.   General: Alert and oriented, in no acute distress  HEENT: Head is normocephalic. Extraocular movements are intact. Cardiovascular : Normal respiratory effort on room air  Musculoskeletal: Tenderness to palpation in left humeral head and left scapular regions. Ext: She has difficulty raising her left arm over her head. Neurologic: Cranial nerves II through XII are grossly intact. No obvious focalities. Speech is fluent. Coordination is intact. Psychiatric: Judgment and insight are intact. Affect is appropriate.   ECOG = 1  0 - Asymptomatic (Fully active, able to carry on all predisease activities without restriction)  1 - Symptomatic but completely ambulatory (Restricted in physically strenuous activity but ambulatory and able to  carry out work of a light or sedentary nature. For example, light housework, office work)  2 - Symptomatic, <50% in bed during the day (Ambulatory and capable of all self care but unable to carry out any work activities. Up and about more than 50% of waking hours)  3 - Symptomatic, >50% in bed, but not bedbound (Capable of only limited self-care, confined to bed or chair 50% or more of waking hours)  4 - Bedbound (Completely disabled. Cannot carry on any self-care. Totally confined to bed or chair)  5 - Death   Santiago Glad MM, Creech RH, Tormey DC, et al. [phone]). "Toxicity and response criteria of the [facility]". Am. Evlyn Clines. Oncol. 5 (6): 649-55   LABORATORY DATA:  Lab Results  Component Value  Date   WBC 3.9 (L) 09/03/2023   HGB 11.6 (L) 09/03/2023   HCT 33.3 (L) 09/03/2023   MCV 94.9 09/03/2023   PLT 280 09/03/2023   CMP     Component Value Date/Time   NA 141 09/03/2023 1423   NA 141 11/09/2017 0900   K 3.9 09/03/2023 1423   K 4.5 11/09/2017 0900   CL 108 09/03/2023 1423   CO2 25 09/03/2023 1423   CO2 28 11/09/2017 0900   GLUCOSE 95 09/03/2023 1423   GLUCOSE 98 11/09/2017 0900   BUN 16 09/03/2023 1423   BUN 11.9 11/09/2017 0900   CREATININE 0.92 09/03/2023 1423   CREATININE 0.8 11/09/2017 0900   CALCIUM 9.5 09/03/2023 1423   CALCIUM 9.1 11/09/2017 0900   PROT 7.0 09/03/2023 1423   PROT 6.8 11/09/2017 0900   ALBUMIN 4.1 09/03/2023 1423   ALBUMIN 3.8 11/09/2017 0900   AST 15 09/03/2023 1423   AST 14 11/09/2017 0900   ALT 17 09/03/2023 1423   ALT 10 11/09/2017 0900   ALKPHOS 45 09/03/2023 1423   ALKPHOS 57 11/09/2017 0900   BILITOT 0.4 09/03/2023 1423   BILITOT 0.64 11/09/2017 0900   EGFR >60 11/09/2017 0900   GFRNONAA >60 09/03/2023 1423        RADIOGRAPHY: As above.  I personally reviewed her PET scan.    IMPRESSION/PLAN: This is a very pleasant 57 year old woman with metastatic breast cancer and associated bone pain at the sites of left  humeral head and left scapular metastases.  Today I spoke with the patient and her husband about a palliative course of radiation to the left humeral head and left scapula.  I recommend she receive 30 Gray in 10 fractions to both sites.  Risks of treatment include but are not necessarily limited to skin irritation, damage to bone, rare nerve injury, rare lung injury.  The patient is enthusiastic about proceeding with treatment.  A consent form is been signed and placed in her chart.  We will conduct treatment planning today and start her treatment in about 1-1/2 weeks.  On date of service, in total, I spent 30 minutes on this encounter. Patient was seen in person.   __________________________________________   Lonie Peak, MD  This document serves as a record of services personally performed by Lonie Peak, MD. It was created on her behalf by Neena Rhymes, a trained medical scribe. The creation of this record is based on the scribe's personal observations and the provider's statements to them. This document has been checked and approved by the attending provider.

## 2023-09-06 NOTE — Telephone Encounter (Signed)
RN attempted to call pt and got no answer, no voicemail was available as well. Rn was attempting to reach pt to obtain pre consult information for this pt. RN will get this information from pt in clinic tomorrow.

## 2023-09-07 ENCOUNTER — Ambulatory Visit
Admission: RE | Admit: 2023-09-07 | Discharge: 2023-09-07 | Disposition: A | Discharge status: Home / Self Care | Payer: BC Managed Care – PPO | Source: Ambulatory Visit | Attending: Radiation Oncology | Admitting: Radiation Oncology

## 2023-09-07 ENCOUNTER — Other Ambulatory Visit: Payer: Self-pay

## 2023-09-07 ENCOUNTER — Encounter: Payer: Self-pay | Admitting: Radiation Oncology

## 2023-09-07 VITALS — BP 103/64 | HR 73 | Temp 97.9°F | Resp 18 | Ht 63.0 in | Wt 122.2 lb

## 2023-09-07 DIAGNOSIS — C50912 Malignant neoplasm of unspecified site of left female breast: Secondary | ICD-10-CM | POA: Diagnosis not present

## 2023-09-07 DIAGNOSIS — Z79811 Long term (current) use of aromatase inhibitors: Secondary | ICD-10-CM | POA: Diagnosis not present

## 2023-09-07 DIAGNOSIS — D0512 Intraductal carcinoma in situ of left breast: Secondary | ICD-10-CM | POA: Diagnosis not present

## 2023-09-07 DIAGNOSIS — C50312 Malignant neoplasm of lower-inner quadrant of left female breast: Secondary | ICD-10-CM | POA: Diagnosis not present

## 2023-09-07 DIAGNOSIS — Z170 Estrogen receptor positive status [ER+]: Secondary | ICD-10-CM | POA: Insufficient documentation

## 2023-09-07 DIAGNOSIS — C7951 Secondary malignant neoplasm of bone: Secondary | ICD-10-CM | POA: Insufficient documentation

## 2023-09-07 DIAGNOSIS — Z9012 Acquired absence of left breast and nipple: Secondary | ICD-10-CM | POA: Diagnosis not present

## 2023-09-07 DIAGNOSIS — Z79899 Other long term (current) drug therapy: Secondary | ICD-10-CM | POA: Insufficient documentation

## 2023-09-07 DIAGNOSIS — F17210 Nicotine dependence, cigarettes, uncomplicated: Secondary | ICD-10-CM | POA: Diagnosis not present

## 2023-09-07 DIAGNOSIS — Z923 Personal history of irradiation: Secondary | ICD-10-CM | POA: Insufficient documentation

## 2023-09-11 DIAGNOSIS — Z170 Estrogen receptor positive status [ER+]: Secondary | ICD-10-CM | POA: Diagnosis not present

## 2023-09-11 DIAGNOSIS — C7951 Secondary malignant neoplasm of bone: Secondary | ICD-10-CM | POA: Diagnosis not present

## 2023-09-11 DIAGNOSIS — C50912 Malignant neoplasm of unspecified site of left female breast: Secondary | ICD-10-CM | POA: Diagnosis not present

## 2023-09-11 DIAGNOSIS — D0512 Intraductal carcinoma in situ of left breast: Secondary | ICD-10-CM | POA: Diagnosis not present

## 2023-09-17 ENCOUNTER — Ambulatory Visit: Payer: BC Managed Care – PPO

## 2023-09-17 ENCOUNTER — Ambulatory Visit
Admission: RE | Admit: 2023-09-17 | Discharge: 2023-09-17 | Disposition: A | Discharge status: Home / Self Care | Payer: BC Managed Care – PPO | Source: Ambulatory Visit | Attending: Radiation Oncology | Admitting: Radiation Oncology

## 2023-09-17 ENCOUNTER — Other Ambulatory Visit: Payer: Self-pay

## 2023-09-17 DIAGNOSIS — Z170 Estrogen receptor positive status [ER+]: Secondary | ICD-10-CM | POA: Insufficient documentation

## 2023-09-17 DIAGNOSIS — C50212 Malignant neoplasm of upper-inner quadrant of left female breast: Secondary | ICD-10-CM | POA: Insufficient documentation

## 2023-09-17 DIAGNOSIS — Z510 Encounter for antineoplastic radiation therapy: Secondary | ICD-10-CM | POA: Insufficient documentation

## 2023-09-17 DIAGNOSIS — C50912 Malignant neoplasm of unspecified site of left female breast: Secondary | ICD-10-CM | POA: Diagnosis not present

## 2023-09-17 DIAGNOSIS — C7951 Secondary malignant neoplasm of bone: Secondary | ICD-10-CM | POA: Diagnosis not present

## 2023-09-17 LAB — RAD ONC ARIA SESSION SUMMARY
Course Elapsed Days: 0
Plan Fractions Treated to Date: 1
Plan Prescribed Dose Per Fraction: 3 Gy
Plan Total Fractions Prescribed: 10
Plan Total Prescribed Dose: 30 Gy
Reference Point Dosage Given to Date: 3 Gy
Reference Point Session Dosage Given: 3 Gy
Session Number: 1

## 2023-09-18 ENCOUNTER — Other Ambulatory Visit: Payer: Self-pay

## 2023-09-18 ENCOUNTER — Ambulatory Visit
Admission: RE | Admit: 2023-09-18 | Discharge: 2023-09-18 | Disposition: A | Discharge status: Home / Self Care | Payer: BC Managed Care – PPO | Source: Ambulatory Visit | Attending: Radiation Oncology | Admitting: Radiation Oncology

## 2023-09-18 DIAGNOSIS — C50212 Malignant neoplasm of upper-inner quadrant of left female breast: Secondary | ICD-10-CM | POA: Diagnosis not present

## 2023-09-18 DIAGNOSIS — Z170 Estrogen receptor positive status [ER+]: Secondary | ICD-10-CM | POA: Diagnosis not present

## 2023-09-18 DIAGNOSIS — C7951 Secondary malignant neoplasm of bone: Secondary | ICD-10-CM | POA: Diagnosis not present

## 2023-09-18 DIAGNOSIS — C50912 Malignant neoplasm of unspecified site of left female breast: Secondary | ICD-10-CM | POA: Diagnosis not present

## 2023-09-18 DIAGNOSIS — Z510 Encounter for antineoplastic radiation therapy: Secondary | ICD-10-CM | POA: Diagnosis not present

## 2023-09-18 LAB — RAD ONC ARIA SESSION SUMMARY
Course Elapsed Days: 1
Plan Fractions Treated to Date: 2
Plan Prescribed Dose Per Fraction: 3 Gy
Plan Total Fractions Prescribed: 10
Plan Total Prescribed Dose: 30 Gy
Reference Point Dosage Given to Date: 6 Gy
Reference Point Session Dosage Given: 3 Gy
Session Number: 2

## 2023-09-19 ENCOUNTER — Ambulatory Visit
Admission: RE | Admit: 2023-09-19 | Discharge: 2023-09-19 | Disposition: A | Discharge status: Home / Self Care | Payer: BC Managed Care – PPO | Source: Ambulatory Visit | Attending: Radiation Oncology

## 2023-09-19 ENCOUNTER — Other Ambulatory Visit: Payer: Self-pay

## 2023-09-19 DIAGNOSIS — C50212 Malignant neoplasm of upper-inner quadrant of left female breast: Secondary | ICD-10-CM | POA: Diagnosis not present

## 2023-09-19 DIAGNOSIS — C50912 Malignant neoplasm of unspecified site of left female breast: Secondary | ICD-10-CM | POA: Diagnosis not present

## 2023-09-19 DIAGNOSIS — C7951 Secondary malignant neoplasm of bone: Secondary | ICD-10-CM | POA: Diagnosis not present

## 2023-09-19 DIAGNOSIS — Z510 Encounter for antineoplastic radiation therapy: Secondary | ICD-10-CM | POA: Diagnosis not present

## 2023-09-19 DIAGNOSIS — Z170 Estrogen receptor positive status [ER+]: Secondary | ICD-10-CM | POA: Diagnosis not present

## 2023-09-19 LAB — RAD ONC ARIA SESSION SUMMARY
Course Elapsed Days: 2
Plan Fractions Treated to Date: 3
Plan Prescribed Dose Per Fraction: 3 Gy
Plan Total Fractions Prescribed: 10
Plan Total Prescribed Dose: 30 Gy
Reference Point Dosage Given to Date: 9 Gy
Reference Point Session Dosage Given: 3 Gy
Session Number: 3

## 2023-09-20 ENCOUNTER — Ambulatory Visit
Admission: RE | Admit: 2023-09-20 | Discharge: 2023-09-20 | Disposition: A | Discharge status: Home / Self Care | Payer: BC Managed Care – PPO | Source: Ambulatory Visit | Attending: Radiation Oncology | Admitting: Radiation Oncology

## 2023-09-20 ENCOUNTER — Other Ambulatory Visit: Payer: Self-pay

## 2023-09-20 DIAGNOSIS — C50212 Malignant neoplasm of upper-inner quadrant of left female breast: Secondary | ICD-10-CM | POA: Diagnosis not present

## 2023-09-20 DIAGNOSIS — C7951 Secondary malignant neoplasm of bone: Secondary | ICD-10-CM | POA: Diagnosis not present

## 2023-09-20 DIAGNOSIS — Z170 Estrogen receptor positive status [ER+]: Secondary | ICD-10-CM | POA: Diagnosis not present

## 2023-09-20 DIAGNOSIS — Z510 Encounter for antineoplastic radiation therapy: Secondary | ICD-10-CM | POA: Diagnosis not present

## 2023-09-20 DIAGNOSIS — C50912 Malignant neoplasm of unspecified site of left female breast: Secondary | ICD-10-CM | POA: Diagnosis not present

## 2023-09-20 LAB — RAD ONC ARIA SESSION SUMMARY
Course Elapsed Days: 3
Plan Fractions Treated to Date: 4
Plan Prescribed Dose Per Fraction: 3 Gy
Plan Total Fractions Prescribed: 10
Plan Total Prescribed Dose: 30 Gy
Reference Point Dosage Given to Date: 12 Gy
Reference Point Session Dosage Given: 3 Gy
Session Number: 4

## 2023-09-21 ENCOUNTER — Ambulatory Visit
Admission: RE | Admit: 2023-09-21 | Discharge: 2023-09-21 | Disposition: A | Discharge status: Home / Self Care | Payer: BC Managed Care – PPO | Source: Ambulatory Visit | Attending: Radiation Oncology | Admitting: Radiation Oncology

## 2023-09-21 ENCOUNTER — Other Ambulatory Visit: Payer: Self-pay

## 2023-09-21 DIAGNOSIS — Z170 Estrogen receptor positive status [ER+]: Secondary | ICD-10-CM | POA: Diagnosis not present

## 2023-09-21 DIAGNOSIS — C50212 Malignant neoplasm of upper-inner quadrant of left female breast: Secondary | ICD-10-CM | POA: Diagnosis not present

## 2023-09-21 DIAGNOSIS — C7951 Secondary malignant neoplasm of bone: Secondary | ICD-10-CM | POA: Diagnosis not present

## 2023-09-21 DIAGNOSIS — Z510 Encounter for antineoplastic radiation therapy: Secondary | ICD-10-CM | POA: Diagnosis not present

## 2023-09-21 DIAGNOSIS — C50912 Malignant neoplasm of unspecified site of left female breast: Secondary | ICD-10-CM | POA: Diagnosis not present

## 2023-09-21 LAB — RAD ONC ARIA SESSION SUMMARY
Course Elapsed Days: 4
Plan Fractions Treated to Date: 5
Plan Prescribed Dose Per Fraction: 3 Gy
Plan Total Fractions Prescribed: 10
Plan Total Prescribed Dose: 30 Gy
Reference Point Dosage Given to Date: 15 Gy
Reference Point Session Dosage Given: 3 Gy
Session Number: 5

## 2023-09-24 ENCOUNTER — Ambulatory Visit
Admission: RE | Admit: 2023-09-24 | Discharge: 2023-09-24 | Disposition: A | Discharge status: Home / Self Care | Payer: BC Managed Care – PPO | Source: Ambulatory Visit | Attending: Radiation Oncology

## 2023-09-24 ENCOUNTER — Other Ambulatory Visit: Payer: Self-pay

## 2023-09-24 ENCOUNTER — Other Ambulatory Visit: Payer: Self-pay | Admitting: *Deleted

## 2023-09-24 ENCOUNTER — Other Ambulatory Visit: Payer: Self-pay | Admitting: Radiation Oncology

## 2023-09-24 DIAGNOSIS — C50912 Malignant neoplasm of unspecified site of left female breast: Secondary | ICD-10-CM | POA: Diagnosis not present

## 2023-09-24 DIAGNOSIS — C50212 Malignant neoplasm of upper-inner quadrant of left female breast: Secondary | ICD-10-CM | POA: Diagnosis not present

## 2023-09-24 DIAGNOSIS — Z170 Estrogen receptor positive status [ER+]: Secondary | ICD-10-CM | POA: Diagnosis not present

## 2023-09-24 DIAGNOSIS — C7951 Secondary malignant neoplasm of bone: Secondary | ICD-10-CM | POA: Diagnosis not present

## 2023-09-24 DIAGNOSIS — Z510 Encounter for antineoplastic radiation therapy: Secondary | ICD-10-CM | POA: Diagnosis not present

## 2023-09-24 LAB — RAD ONC ARIA SESSION SUMMARY
Course Elapsed Days: 7
Plan Fractions Treated to Date: 6
Plan Prescribed Dose Per Fraction: 3 Gy
Plan Total Fractions Prescribed: 10
Plan Total Prescribed Dose: 30 Gy
Reference Point Dosage Given to Date: 18 Gy
Reference Point Session Dosage Given: 3 Gy
Session Number: 6

## 2023-09-24 MED ORDER — RADIAPLEXRX EX GEL: Freq: Once | CUTANEOUS | Status: AC

## 2023-09-24 NOTE — Progress Notes (Signed)
Order added for Palliative Care

## 2023-09-25 ENCOUNTER — Other Ambulatory Visit: Payer: Self-pay

## 2023-09-25 ENCOUNTER — Ambulatory Visit
Admission: RE | Admit: 2023-09-25 | Discharge: 2023-09-25 | Disposition: A | Discharge status: Home / Self Care | Payer: BC Managed Care – PPO | Source: Ambulatory Visit | Attending: Radiation Oncology | Admitting: Radiation Oncology

## 2023-09-25 DIAGNOSIS — C50212 Malignant neoplasm of upper-inner quadrant of left female breast: Secondary | ICD-10-CM | POA: Diagnosis not present

## 2023-09-25 DIAGNOSIS — C7951 Secondary malignant neoplasm of bone: Secondary | ICD-10-CM | POA: Diagnosis not present

## 2023-09-25 DIAGNOSIS — Z510 Encounter for antineoplastic radiation therapy: Secondary | ICD-10-CM | POA: Diagnosis not present

## 2023-09-25 DIAGNOSIS — Z170 Estrogen receptor positive status [ER+]: Secondary | ICD-10-CM | POA: Diagnosis not present

## 2023-09-25 DIAGNOSIS — C50912 Malignant neoplasm of unspecified site of left female breast: Secondary | ICD-10-CM | POA: Diagnosis not present

## 2023-09-25 LAB — RAD ONC ARIA SESSION SUMMARY
Course Elapsed Days: 8
Plan Fractions Treated to Date: 7
Plan Prescribed Dose Per Fraction: 3 Gy
Plan Total Fractions Prescribed: 10
Plan Total Prescribed Dose: 30 Gy
Reference Point Dosage Given to Date: 21 Gy
Reference Point Session Dosage Given: 3 Gy
Session Number: 7

## 2023-09-26 ENCOUNTER — Ambulatory Visit
Admission: RE | Admit: 2023-09-26 | Discharge: 2023-09-26 | Disposition: A | Discharge status: Home / Self Care | Payer: BC Managed Care – PPO | Source: Ambulatory Visit | Attending: Radiation Oncology | Admitting: Radiation Oncology

## 2023-09-26 ENCOUNTER — Other Ambulatory Visit: Payer: Self-pay

## 2023-09-26 DIAGNOSIS — Z510 Encounter for antineoplastic radiation therapy: Secondary | ICD-10-CM | POA: Diagnosis not present

## 2023-09-26 DIAGNOSIS — C50912 Malignant neoplasm of unspecified site of left female breast: Secondary | ICD-10-CM | POA: Diagnosis not present

## 2023-09-26 DIAGNOSIS — C50212 Malignant neoplasm of upper-inner quadrant of left female breast: Secondary | ICD-10-CM | POA: Diagnosis not present

## 2023-09-26 DIAGNOSIS — C7951 Secondary malignant neoplasm of bone: Secondary | ICD-10-CM | POA: Diagnosis not present

## 2023-09-26 DIAGNOSIS — Z170 Estrogen receptor positive status [ER+]: Secondary | ICD-10-CM | POA: Diagnosis not present

## 2023-09-26 LAB — RAD ONC ARIA SESSION SUMMARY
Course Elapsed Days: 9
Plan Fractions Treated to Date: 8
Plan Prescribed Dose Per Fraction: 3 Gy
Plan Total Fractions Prescribed: 10
Plan Total Prescribed Dose: 30 Gy
Reference Point Dosage Given to Date: 24 Gy
Reference Point Session Dosage Given: 3 Gy
Session Number: 8

## 2023-09-27 ENCOUNTER — Other Ambulatory Visit: Payer: Self-pay

## 2023-09-27 ENCOUNTER — Ambulatory Visit
Admission: RE | Admit: 2023-09-27 | Discharge: 2023-09-27 | Disposition: A | Discharge status: Home / Self Care | Payer: BC Managed Care – PPO | Source: Ambulatory Visit | Attending: Radiation Oncology | Admitting: Radiation Oncology

## 2023-09-27 DIAGNOSIS — Z7901 Long term (current) use of anticoagulants: Secondary | ICD-10-CM | POA: Diagnosis not present

## 2023-09-27 DIAGNOSIS — M1611 Unilateral primary osteoarthritis, right hip: Secondary | ICD-10-CM | POA: Diagnosis not present

## 2023-09-27 DIAGNOSIS — C50212 Malignant neoplasm of upper-inner quadrant of left female breast: Secondary | ICD-10-CM | POA: Diagnosis not present

## 2023-09-27 DIAGNOSIS — D6489 Other specified anemias: Secondary | ICD-10-CM | POA: Diagnosis not present

## 2023-09-27 DIAGNOSIS — Z853 Personal history of malignant neoplasm of breast: Secondary | ICD-10-CM | POA: Diagnosis not present

## 2023-09-27 DIAGNOSIS — Q631 Lobulated, fused and horseshoe kidney: Secondary | ICD-10-CM | POA: Diagnosis not present

## 2023-09-27 DIAGNOSIS — Z79899 Other long term (current) drug therapy: Secondary | ICD-10-CM | POA: Diagnosis not present

## 2023-09-27 DIAGNOSIS — Z471 Aftercare following joint replacement surgery: Secondary | ICD-10-CM | POA: Diagnosis not present

## 2023-09-27 DIAGNOSIS — M8580 Other specified disorders of bone density and structure, unspecified site: Secondary | ICD-10-CM | POA: Diagnosis not present

## 2023-09-27 DIAGNOSIS — D72819 Decreased white blood cell count, unspecified: Secondary | ICD-10-CM | POA: Diagnosis not present

## 2023-09-27 DIAGNOSIS — M25551 Pain in right hip: Secondary | ICD-10-CM | POA: Diagnosis not present

## 2023-09-27 DIAGNOSIS — Z90722 Acquired absence of ovaries, bilateral: Secondary | ICD-10-CM | POA: Diagnosis not present

## 2023-09-27 DIAGNOSIS — Z96641 Presence of right artificial hip joint: Secondary | ICD-10-CM | POA: Diagnosis not present

## 2023-09-27 DIAGNOSIS — R52 Pain, unspecified: Secondary | ICD-10-CM | POA: Diagnosis not present

## 2023-09-27 DIAGNOSIS — M89251 Other disorders of bone development and growth, right femur: Secondary | ICD-10-CM | POA: Diagnosis not present

## 2023-09-27 DIAGNOSIS — Z9889 Other specified postprocedural states: Secondary | ICD-10-CM | POA: Diagnosis not present

## 2023-09-27 DIAGNOSIS — Z923 Personal history of irradiation: Secondary | ICD-10-CM | POA: Diagnosis not present

## 2023-09-27 DIAGNOSIS — M25512 Pain in left shoulder: Secondary | ICD-10-CM | POA: Diagnosis not present

## 2023-09-27 DIAGNOSIS — C7951 Secondary malignant neoplasm of bone: Secondary | ICD-10-CM | POA: Diagnosis not present

## 2023-09-27 DIAGNOSIS — F17210 Nicotine dependence, cigarettes, uncomplicated: Secondary | ICD-10-CM | POA: Diagnosis not present

## 2023-09-27 DIAGNOSIS — Z800 Family history of malignant neoplasm of digestive organs: Secondary | ICD-10-CM | POA: Diagnosis not present

## 2023-09-27 DIAGNOSIS — Z170 Estrogen receptor positive status [ER+]: Secondary | ICD-10-CM | POA: Diagnosis not present

## 2023-09-27 DIAGNOSIS — M898X5 Other specified disorders of bone, thigh: Secondary | ICD-10-CM | POA: Diagnosis not present

## 2023-09-27 DIAGNOSIS — D259 Leiomyoma of uterus, unspecified: Secondary | ICD-10-CM | POA: Diagnosis not present

## 2023-09-27 DIAGNOSIS — Z9049 Acquired absence of other specified parts of digestive tract: Secondary | ICD-10-CM | POA: Diagnosis not present

## 2023-09-27 DIAGNOSIS — D6481 Anemia due to antineoplastic chemotherapy: Secondary | ICD-10-CM | POA: Diagnosis not present

## 2023-09-27 DIAGNOSIS — Z7982 Long term (current) use of aspirin: Secondary | ICD-10-CM | POA: Diagnosis not present

## 2023-09-27 DIAGNOSIS — G893 Neoplasm related pain (acute) (chronic): Secondary | ICD-10-CM | POA: Diagnosis not present

## 2023-09-27 DIAGNOSIS — D630 Anemia in neoplastic disease: Secondary | ICD-10-CM | POA: Diagnosis not present

## 2023-09-27 DIAGNOSIS — R2681 Unsteadiness on feet: Secondary | ICD-10-CM | POA: Diagnosis not present

## 2023-09-27 DIAGNOSIS — Z510 Encounter for antineoplastic radiation therapy: Secondary | ICD-10-CM | POA: Diagnosis not present

## 2023-09-27 DIAGNOSIS — Z7969 Long term (current) use of other immunomodulators and immunosuppressants: Secondary | ICD-10-CM | POA: Diagnosis not present

## 2023-09-27 DIAGNOSIS — Z9012 Acquired absence of left breast and nipple: Secondary | ICD-10-CM | POA: Diagnosis not present

## 2023-09-27 DIAGNOSIS — M898X9 Other specified disorders of bone, unspecified site: Secondary | ICD-10-CM | POA: Diagnosis not present

## 2023-09-27 DIAGNOSIS — C50912 Malignant neoplasm of unspecified site of left female breast: Secondary | ICD-10-CM | POA: Diagnosis not present

## 2023-09-27 LAB — RAD ONC ARIA SESSION SUMMARY
Course Elapsed Days: 10
Plan Fractions Treated to Date: 9
Plan Prescribed Dose Per Fraction: 3 Gy
Plan Total Fractions Prescribed: 10
Plan Total Prescribed Dose: 30 Gy
Reference Point Dosage Given to Date: 27 Gy
Reference Point Session Dosage Given: 3 Gy
Session Number: 9

## 2023-09-28 ENCOUNTER — Other Ambulatory Visit: Payer: Self-pay

## 2023-09-28 ENCOUNTER — Ambulatory Visit
Admission: RE | Admit: 2023-09-28 | Discharge: 2023-09-28 | Disposition: A | Discharge status: Home / Self Care | Payer: BC Managed Care – PPO | Source: Ambulatory Visit | Attending: Radiation Oncology | Admitting: Radiation Oncology

## 2023-09-28 DIAGNOSIS — Z510 Encounter for antineoplastic radiation therapy: Secondary | ICD-10-CM | POA: Diagnosis not present

## 2023-09-28 DIAGNOSIS — C50912 Malignant neoplasm of unspecified site of left female breast: Secondary | ICD-10-CM | POA: Diagnosis not present

## 2023-09-28 DIAGNOSIS — Z170 Estrogen receptor positive status [ER+]: Secondary | ICD-10-CM | POA: Diagnosis not present

## 2023-09-28 DIAGNOSIS — C7951 Secondary malignant neoplasm of bone: Secondary | ICD-10-CM | POA: Diagnosis not present

## 2023-09-28 LAB — RAD ONC ARIA SESSION SUMMARY
Course Elapsed Days: 11
Plan Fractions Treated to Date: 10
Plan Prescribed Dose Per Fraction: 3 Gy
Plan Total Fractions Prescribed: 10
Plan Total Prescribed Dose: 30 Gy
Reference Point Dosage Given to Date: 30 Gy
Reference Point Session Dosage Given: 3 Gy
Session Number: 10

## 2023-09-29 ENCOUNTER — Observation Stay (HOSPITAL_COMMUNITY): Payer: BC Managed Care – PPO

## 2023-09-29 ENCOUNTER — Emergency Department (HOSPITAL_COMMUNITY): Payer: BC Managed Care – PPO

## 2023-09-29 ENCOUNTER — Encounter (HOSPITAL_COMMUNITY): Payer: Self-pay

## 2023-09-29 ENCOUNTER — Inpatient Hospital Stay (HOSPITAL_COMMUNITY)
Admission: EM | Admit: 2023-09-29 | Discharge: 2023-10-02 | DRG: 940 | Disposition: A | Discharge status: Home / Self Care | Payer: BC Managed Care – PPO | Attending: Internal Medicine | Admitting: Internal Medicine

## 2023-09-29 ENCOUNTER — Other Ambulatory Visit: Payer: Self-pay

## 2023-09-29 DIAGNOSIS — Z7901 Long term (current) use of anticoagulants: Secondary | ICD-10-CM

## 2023-09-29 DIAGNOSIS — M25512 Pain in left shoulder: Secondary | ICD-10-CM | POA: Diagnosis present

## 2023-09-29 DIAGNOSIS — M25551 Pain in right hip: Secondary | ICD-10-CM | POA: Diagnosis not present

## 2023-09-29 DIAGNOSIS — F17210 Nicotine dependence, cigarettes, uncomplicated: Secondary | ICD-10-CM | POA: Diagnosis present

## 2023-09-29 DIAGNOSIS — C50212 Malignant neoplasm of upper-inner quadrant of left female breast: Secondary | ICD-10-CM | POA: Diagnosis present

## 2023-09-29 DIAGNOSIS — R52 Pain, unspecified: Secondary | ICD-10-CM | POA: Diagnosis not present

## 2023-09-29 DIAGNOSIS — Z923 Personal history of irradiation: Secondary | ICD-10-CM

## 2023-09-29 DIAGNOSIS — M1611 Unilateral primary osteoarthritis, right hip: Secondary | ICD-10-CM | POA: Diagnosis not present

## 2023-09-29 DIAGNOSIS — Z9889 Other specified postprocedural states: Secondary | ICD-10-CM

## 2023-09-29 DIAGNOSIS — Z79899 Other long term (current) drug therapy: Secondary | ICD-10-CM

## 2023-09-29 DIAGNOSIS — Z96641 Presence of right artificial hip joint: Secondary | ICD-10-CM

## 2023-09-29 DIAGNOSIS — D259 Leiomyoma of uterus, unspecified: Secondary | ICD-10-CM | POA: Diagnosis not present

## 2023-09-29 DIAGNOSIS — G893 Neoplasm related pain (acute) (chronic): Principal | ICD-10-CM | POA: Diagnosis present

## 2023-09-29 DIAGNOSIS — Z9012 Acquired absence of left breast and nipple: Secondary | ICD-10-CM

## 2023-09-29 DIAGNOSIS — Z170 Estrogen receptor positive status [ER+]: Secondary | ICD-10-CM

## 2023-09-29 DIAGNOSIS — D649 Anemia, unspecified: Secondary | ICD-10-CM | POA: Diagnosis present

## 2023-09-29 DIAGNOSIS — Z7969 Long term (current) use of other immunomodulators and immunosuppressants: Secondary | ICD-10-CM

## 2023-09-29 DIAGNOSIS — Q631 Lobulated, fused and horseshoe kidney: Secondary | ICD-10-CM

## 2023-09-29 DIAGNOSIS — M898X9 Other specified disorders of bone, unspecified site: Secondary | ICD-10-CM | POA: Diagnosis not present

## 2023-09-29 DIAGNOSIS — D6489 Other specified anemias: Secondary | ICD-10-CM | POA: Diagnosis present

## 2023-09-29 DIAGNOSIS — D6481 Anemia due to antineoplastic chemotherapy: Secondary | ICD-10-CM | POA: Diagnosis present

## 2023-09-29 DIAGNOSIS — D630 Anemia in neoplastic disease: Secondary | ICD-10-CM | POA: Diagnosis present

## 2023-09-29 DIAGNOSIS — Z90722 Acquired absence of ovaries, bilateral: Secondary | ICD-10-CM

## 2023-09-29 DIAGNOSIS — Z7982 Long term (current) use of aspirin: Secondary | ICD-10-CM

## 2023-09-29 DIAGNOSIS — Z9049 Acquired absence of other specified parts of digestive tract: Secondary | ICD-10-CM

## 2023-09-29 DIAGNOSIS — C7951 Secondary malignant neoplasm of bone: Secondary | ICD-10-CM | POA: Diagnosis present

## 2023-09-29 DIAGNOSIS — D72819 Decreased white blood cell count, unspecified: Secondary | ICD-10-CM | POA: Diagnosis not present

## 2023-09-29 DIAGNOSIS — Z800 Family history of malignant neoplasm of digestive organs: Secondary | ICD-10-CM

## 2023-09-29 LAB — CBC WITH DIFFERENTIAL/PLATELET
Abs Immature Granulocytes: 0.01 10*3/uL (ref 0.00–0.07)
Basophils Absolute: 0 10*3/uL (ref 0.0–0.1)
Basophils Relative: 1 %
Eosinophils Absolute: 0.1 10*3/uL (ref 0.0–0.5)
Eosinophils Relative: 2 %
HCT: 30.2 % — ABNORMAL LOW (ref 36.0–46.0)
Hemoglobin: 10.4 g/dL — ABNORMAL LOW (ref 12.0–15.0)
Immature Granulocytes: 0 %
Lymphocytes Relative: 29 %
Lymphs Abs: 1.2 10*3/uL (ref 0.7–4.0)
MCH: 33.9 pg (ref 26.0–34.0)
MCHC: 34.4 g/dL (ref 30.0–36.0)
MCV: 98.4 fL (ref 80.0–100.0)
Monocytes Absolute: 0.3 10*3/uL (ref 0.1–1.0)
Monocytes Relative: 8 %
Neutro Abs: 2.4 10*3/uL (ref 1.7–7.7)
Neutrophils Relative %: 60 %
Platelets: 214 10*3/uL (ref 150–400)
RBC: 3.07 MIL/uL — ABNORMAL LOW (ref 3.87–5.11)
RDW: 15.7 % — ABNORMAL HIGH (ref 11.5–15.5)
WBC: 4 10*3/uL (ref 4.0–10.5)
nRBC: 0 % (ref 0.0–0.2)

## 2023-09-29 LAB — BASIC METABOLIC PANEL
Anion gap: 8 (ref 5–15)
BUN: 15 mg/dL (ref 6–20)
CO2: 28 mmol/L (ref 22–32)
Calcium: 8.9 mg/dL (ref 8.9–10.3)
Chloride: 101 mmol/L (ref 98–111)
Creatinine, Ser: 0.96 mg/dL (ref 0.44–1.00)
GFR, Estimated: >60 mL/min (ref >60)
Glucose, Bld: 93 mg/dL (ref 70–99)
Potassium: 3.6 mmol/L (ref 3.5–5.1)
Sodium: 137 mmol/L (ref 135–145)

## 2023-09-29 MED ORDER — MORPHINE SULFATE (PF) 2 MG/ML IV SOLN: 2.0000 mg | INTRAVENOUS | Status: DC | PRN

## 2023-09-29 MED ORDER — ACETAMINOPHEN 325 MG PO TABS
650.0000 mg | ORAL_TABLET | Freq: Four times a day (QID) | ORAL | Status: DC | PRN
Start: 2023-09-29 — End: 2023-09-30

## 2023-09-29 MED ORDER — OXYCODONE HCL 5 MG PO TABS
5.0000 mg | ORAL_TABLET | ORAL | Status: DC | PRN
Start: 2023-09-29 — End: 2023-09-30

## 2023-09-29 MED ORDER — KETOROLAC TROMETHAMINE 15 MG/ML IJ SOLN
15.0000 mg | Freq: Four times a day (QID) | INTRAMUSCULAR | Status: AC | PRN
  Administered 2023-09-30: 15 mg via INTRAVENOUS
  Filled 2023-09-29: qty 1

## 2023-09-29 MED ORDER — MORPHINE SULFATE (PF) 4 MG/ML IV SOLN
4.0000 mg | Freq: Once | INTRAVENOUS | Status: AC
  Administered 2023-09-29: 4 mg via INTRAVENOUS
  Filled 2023-09-29: qty 1

## 2023-09-29 MED ORDER — ACETAMINOPHEN 650 MG RE SUPP
650.0000 mg | Freq: Four times a day (QID) | RECTAL | Status: DC | PRN
Start: 2023-09-29 — End: 2023-09-30

## 2023-09-29 NOTE — ED Notes (Signed)
ED TO INPATIENT HANDOFF REPORT  ED Nurse Name and Phone #: Crist Infante RN [phone]  S Name/Age/Gender Kristina Nicholson 57 y.o. female Room/Bed: WA13/WA13  Code Status   Code Status: Prior  Home/SNF/Other Home Patient oriented to: self, place, time, and situation Is this baseline? Yes   Triage Complete: Triage complete  Chief Complaint Intractable pain [R52]  Triage Note Pt c/o pain in R hip starting yesterday evening.  Denies injury.  Pain score 9/10 w/ ambulation.  Hx of L breast CA w/ bone mets. Pt recently had radiation on L shoulder.    Allergies No Known Allergies  Level of Care/Admitting Diagnosis ED Disposition     ED Disposition  Admit   Condition  --   Comment  Hospital Area: [facility] [[id number]]  Level of Care: Med-Surg [16]  May place patient in observation at [facility] or [facility] if equivalent level of care is available:: No  Covid Evaluation: Asymptomatic - no recent exposure (last 10 days) testing not required  Diagnosis: Intractable pain [708987]  Admitting Physician: Bobette Mo [[id number]]  Attending Physician: Bobette Mo [[id number]]          B Medical/Surgery History Past Medical History:  Diagnosis Date   Chronic left shoulder pain    Headache    History of external beam radiation therapy    07-31-2017  to  09-10-2017  LEft chest wass 50 gy in 25 fractions, Left Supraclavicular 45 Gy in 25 fractions, Left Chest wall scar boost 10 Gy in 5 fractions.   Horseshoe kidney    Malignant neoplasm of upper-inner quadrant of left breast in female, estrogen receptor positive (HCC) 04/18/2017   oncologist--- dr Mosetta Putt;   dx 06/ 2018 ,  Stage IIB, ER/PR+, G3, IDC/ DCIS;  06-13-2017 s/p left mastectomy w/ node dissection ;  completed radiation 09-10-2017;   recurrence w/ mets to bone 07/ 2024 , started chemotherapy   Metastatic cancer to bone (HCC) 05/2023   Uterine fibroid    Past Surgical History:  Procedure  Laterality Date   APPENDECTOMY  2005   COLONOSCOPY  2016   GANGLION CYST EXCISION Right 09/24/2017   Procedure: EXCISION OF RIGHT DORSAL WRIST GANGLION;  Surgeon: Mack Hook, MD;  Location: [facility];  Service: Orthopedics;  Laterality: Right;   LAPAROSCOPIC SALPINGO OOPHERECTOMY Bilateral 08/24/2023   Procedure: LAPAROSCOPIC SALPINGO OOPHORECTOMY;  Surgeon: [name], DO;  Location: [facility];  Service: Gynecology;  Laterality: Bilateral;   LIPOMA EXCISION Left 06/13/2017   Procedure: EXCISION 3CM LIPOMA ON LEFT ARM;  Surgeon: Griselda Miner, MD;  Location: [facility];  Service: General;  Laterality: Left;   MASTECTOMY W/ SENTINEL NODE BIOPSY Left 06/13/2017   Procedure: MASTECTOMY WITH SENTINEL LYMPH NODE BIOPSY;  Surgeon: Griselda Miner, MD;  Location: [facility];  Service: General;  Laterality: Left;     A IV Location/Drains/Wounds Patient Lines/Drains/Airways Status     Active Line/Drains/Airways     Name Placement date Placement time Site Days   Peripheral IV 09/29/23 20 G 1" Right Antecubital 09/29/23  1253  Antecubital  less than 1   Closed System Drain 1 Left;Lateral;Inferior Breast Bulb (JP) 19 Fr. 06/13/17  1217  Breast  2299   Incision - 2 Ports Abdomen Umbilicus Mid;Lower 08/24/23  0800  -- 36            Intake/Output Last 24 hours No intake or output data in the 24 hours ending 09/29/23 1539  Labs/Imaging Results for orders placed or performed during the hospital encounter of 09/29/23 (from the past 48 hour(s))  CBC with Differential/Platelet     Status: Abnormal   Collection Time: 09/29/23 12:54 PM  Result Value Ref Range   WBC 4.0 4.0 - 10.5 K/uL   RBC 3.07 (L) 3.87 - 5.11 MIL/uL   Hemoglobin 10.4 (L) 12.0 - 15.0 g/dL   HCT 47.8 (L) 29.5 - 62.1 %   MCV 98.4 80.0 - 100.0 fL   MCH 33.9 26.0 - 34.0 pg   MCHC 34.4 30.0 - 36.0 g/dL   RDW 30.8 (H) 65.7 - 84.6 %   Platelets 214 150 - 400 K/uL   nRBC 0.0 0.0 - 0.2 %   Neutrophils  Relative % 60 %   Neutro Abs 2.4 1.7 - 7.7 K/uL   Lymphocytes Relative 29 %   Lymphs Abs 1.2 0.7 - 4.0 K/uL   Monocytes Relative 8 %   Monocytes Absolute 0.3 0.1 - 1.0 K/uL   Eosinophils Relative 2 %   Eosinophils Absolute 0.1 0.0 - 0.5 K/uL   Basophils Relative 1 %   Basophils Absolute 0.0 0.0 - 0.1 K/uL   Immature Granulocytes 0 %   Abs Immature Granulocytes 0.01 0.00 - 0.07 K/uL    Comment: Performed at [facility], [address], [place], [place] [address]  Basic metabolic panel     Status: None   Collection Time: 09/29/23 12:54 PM  Result Value Ref Range   Sodium 137 135 - 145 mmol/L   Potassium 3.6 3.5 - 5.1 mmol/L   Chloride 101 98 - 111 mmol/L   CO2 28 22 - 32 mmol/L   Glucose, Bld 93 70 - 99 mg/dL    Comment: Glucose reference range applies only to samples taken after fasting for at least 8 hours.   BUN 15 6 - 20 mg/dL   Creatinine, Ser 2.84 0.44 - 1.00 mg/dL   Calcium 8.9 8.9 - 13.2 mg/dL   GFR, Estimated >44 >01 mL/min    Comment: (NOTE) Calculated using the CKD-EPI Creatinine Equation (2021)    Anion gap 8 5 - 15    Comment: Performed at [facility], [address] [place], [place] [address]   CT Hip Right Wo Contrast  Result Date: 09/29/2023 CLINICAL DATA:  Right hip pain.  History of breast cancer EXAM: CT OF THE RIGHT HIP WITHOUT CONTRAST TECHNIQUE: Multidetector CT imaging of the right hip was performed according to the standard protocol. Multiplanar CT image reconstructions were also generated. RADIATION DOSE REDUCTION: This exam was performed according to the departmental dose-optimization program which includes automated exposure control, adjustment of the mA and/or kV according to patient size and/or use of iterative reconstruction technique. COMPARISON:  X-ray 09/29/2023.  PET-CT 06/19/2023 FINDINGS: Bones/Joint/Cartilage Rounded lytic lesion in the anterosuperior aspect of the right femoral head measuring 14 mm in  diameter. There is cortical breakthrough superiorly. This lesion has partially sclerotic margins. Additionally there is a 6 mm circumscribed rounded lucent lesion anteriorly at the femoral head-neck junction with well-defined sclerotic margins. No additional lytic or sclerotic bone lesions. No fracture or dislocation. Mild right hip joint space narrowing. No hip joint effusion is evident. Ligaments Suboptimally assessed by CT. Muscles and Tendons No acute musculotendinous abnormality by CT. Soft tissues No soft tissue swelling or fluid collections. Fibroid uterus. No inguinal lymphadenopathy. IMPRESSION: 1. Rounded lytic lesion within the right femoral head measuring 14 mm with cortical breakthrough superiorly. This corresponds  to hypermetabolic lesion seen on the previous PET-CT and is compatible with metastatic disease in the setting of known breast cancer. 2. Additional 6 mm circumscribed rounded lucent lesion anteriorly at the femoral head-neck junction. This is favored to represent a benign synovial herniation pit although an additional tiny metastatic lesion is not excluded. 3. Mild right hip osteoarthritis.  No fracture or dislocation. 4. Fibroid uterus. Electronically Signed   By: Duanne Guess D.O.   On: 09/29/2023 14:28   DG Hip Unilat W or Wo Pelvis 2-3 Views Right  Result Date: 09/29/2023 CLINICAL DATA:  Right hip pain beginning yesterday. No known injury. EXAM: DG HIP (WITH OR WITHOUT PELVIS) 2-3V RIGHT COMPARISON:  None Available. FINDINGS: There is no evidence of hip fracture or dislocation. There is no evidence of arthropathy or other significant abnormality IMPRESSION: Negative. Electronically Signed   By: Danae Orleans M.D.   On: 09/29/2023 12:45    Pending Labs Unresulted Labs (From admission, onward)    None       Vitals/Pain Today's Vitals   09/29/23 1154 09/29/23 1155 09/29/23 1328  BP:  (!) 105/52   Pulse:  70   Resp:  15   Temp:  98.2 F (36.8 C)   SpO2:  100%    Weight:  55.3 kg   Height:  5\' 3"  (1.6 m)   PainSc: 9   2     Isolation Precautions No active isolations  Medications Medications  morphine (PF) 2 MG/ML injection 2 mg (has no administration in time range)  morphine (PF) 4 MG/ML injection 4 mg (4 mg Intravenous Given 09/29/23 1254)    Mobility walks     Focused Assessments Neuro Assessment Handoff:  Swallow screen pass?  N/A         Neuro Assessment:   Neuro Checks:      Has TPA been given? No If patient is a Neuro Trauma and patient is going to OR before floor call report to 4N Charge nurse: [phone] or [phone]   R Recommendations: See Admitting Provider Note  Report given to:   Additional Notes:

## 2023-09-29 NOTE — ED Provider Notes (Addendum)
[place] EMERGENCY DEPARTMENT AT [facility] Provider Note   CSN: [id number] Arrival date & time: 09/29/23  1143     History  Chief Complaint  Patient presents with   CA Pt   Hip Pain    Sharlon Reyah Brule is a 57 y.o. female.  57 year old female presents with severe right sided hip pain.  Has history of breast cancer with bony mets.  Recently being treated for a left shoulder bone met.  Pain has been constant and there for 24 hours.  Atraumatic.  No fever or chills.  Characterizes sharp and worse ambulation and better with rest.  Patient called her oncologist and told to come here       Home Medications Prior to Admission medications   Medication Sig Start Date End Date Taking? Authorizing Provider  ibuprofen (ADVIL) 600 MG tablet Take 1 tablet (600 mg total) by mouth every 6 (six) hours as needed. 08/24/23  Yes Morris, Megan, DO  abemaciclib (VERZENIO) 100 MG tablet Take 1 tablet (100 mg total) by mouth 2 (two) times daily. 08/22/23   Malachy Mood, MD  gabapentin (NEURONTIN) 100 MG capsule Take 1 capsule (100 mg total) by mouth at bedtime. If tolerates well, OK to increase to 2 or 3 cap at night as needed 09/03/23   Malachy Mood, MD  letrozole [facility]) 2.5 MG tablet Take 1 tablet (2.5 mg total) by mouth daily. Patient taking differently: Take 2.5 mg by mouth daily. Patient to start 10/01/23 09/03/23   Malachy Mood, MD  loperamide (IMODIUM A-D) 2 MG tablet Take 2 mg by mouth 4 (four) times daily as needed for diarrhea or loose stools.    [provider]  Multiple Vitamins-Minerals (MULTIVITAMIN WOMEN PO) Take by mouth daily.    [provider]  Nutritional Supplements (CHLORELLA-SPIRULINA COMPLEX PO) Take by mouth as needed.    [provider]      Allergies    Patient has no known allergies.    Review of Systems   Review of Systems  All other systems reviewed and are negative.   Physical Exam Updated Vital Signs BP (!) 105/52   Pulse  70   Temp 98.2 F (36.8 C)   Resp 15   Ht 1.6 m (5\' 3" )   Wt 55.3 kg   LMP 06/22/2015 Comment: 06/08/17 vaginal bleeding for 4 days  SpO2 100%   BMI 21.61 kg/m  Physical Exam Vitals and nursing note reviewed.  Constitutional:      General: She is not in acute distress.    Appearance: Normal appearance. She is well-developed. She is not toxic-appearing.  HENT:     Head: Normocephalic and atraumatic.  Eyes:     General: Lids are normal.     Conjunctiva/sclera: Conjunctivae normal.     Pupils: Pupils are equal, round, and reactive to light.  Neck:     Thyroid: No thyroid mass.     Trachea: No tracheal deviation.  Cardiovascular:     Rate and Rhythm: Normal rate and regular rhythm.     Heart sounds: Normal heart sounds. No murmur heard.    No gallop.  Pulmonary:     Effort: Pulmonary effort is normal. No respiratory distress.     Breath sounds: Normal breath sounds. No stridor. No decreased breath sounds, wheezing, rhonchi or rales.  Abdominal:     General: There is no distension.     Palpations: Abdomen is soft.     Tenderness: There is no abdominal tenderness.  There is no rebound.  Musculoskeletal:        General: Normal range of motion.     Cervical back: Normal range of motion and neck supple.     Left hip: Tenderness and bony tenderness present. No deformity.     Comments: Neurovasc intact at foot  Skin:    General: Skin is warm and dry.     Findings: No abrasion or rash.  Neurological:     Mental Status: She is alert and oriented to person, place, and time. Mental status is at baseline.     GCS: GCS eye subscore is 4. GCS verbal subscore is 5. GCS motor subscore is 6.     Cranial Nerves: Cranial nerves are intact. No cranial nerve deficit.     Sensory: No sensory deficit.     Motor: Motor function is intact.  Psychiatric:        Attention and Perception: Attention normal.        Speech: Speech normal.        Behavior: Behavior normal.     ED Results /  Procedures / Treatments   Labs (all labs ordered are listed, but only abnormal results are displayed) Labs Reviewed - No data to display  EKG None  Radiology No results found.  Procedures Procedures    Medications Ordered in ED Medications  morphine (PF) 4 MG/ML injection 4 mg (has no administration in time range)    ED Course/ Medical Decision Making/ A&P                                 Medical Decision Making Amount and/or Complexity of Data Reviewed Labs: ordered. Radiology: ordered.  Risk Prescription drug management.   Patient medicated pain here and does well better.  Plain x-rays of her right hip were negative for fracture.  Patient still was feeling comfortable and could not walk.  Subsequently had a CT done which shows metastatic lesions.  Discussed with oncology who recommends admission to the hospital service.  Will also consult orthopedics.  Family notified  3:22 PM Discussed case with orthopedics on-call, Dr. Odis Hollingshead, he will see the patient in consultation.  Request that patient be made n.p.o. after midnight.  If she was communicated to Dr. Robb Matar who is a hospitalist admit the patient.       Final Clinical Impression(s) / ED Diagnoses Final diagnoses:  None    Rx / DC Orders ED Discharge Orders     None         Lorre Nick, MD 09/29/23 1502    Lorre Nick, MD 09/29/23 1523

## 2023-09-29 NOTE — ED Triage Notes (Signed)
Pt c/o pain in R hip starting yesterday evening.  Denies injury.  Pain score 9/10 w/ ambulation.  Hx of L breast CA w/ bone mets. Pt recently had radiation on L shoulder.

## 2023-09-29 NOTE — Care Plan (Addendum)
Orthopaedic Surgery Plan of Care Note   -history and imaging reviewed with primary team (Hospitalist) -pt has breast CA with known multiple bone mets including to right femoral head detected on PET scan Aug 2024. Asymptomatic until 24 hours prior per chart review -ER admitting to hospitalist team -please keep NPO and hold VTE ppx -will review with Arthroplasty colleagues on potential THA tomorrow or on elective basis -full consult note to follow -please obtain right femur CT to rule out skip metastases   Kristina Cedars, MD Orthopaedic Surgery [facility]

## 2023-09-29 NOTE — H&P (Signed)
History and Physical    Patient: Kristina Nicholson XLK:[id number] DOB: Dec 09, 1965 DOA: 09/29/2023 DOS: the patient was seen and examined on 09/29/2023 PCP: Patient, No Pcp Per  Patient coming from: Home  Chief Complaint:  Chief Complaint  Patient presents with   CA Pt   Hip Pain   HPI: Kristina Nicholson is a 57 y.o. female with medical history significant of chronic left shoulder pain, history of upper inner quadrant left breast cancer with positive estrogen receptors, left mastectomy, metastasis to bone, external beam radiation of left chest mass, left supraclavicular area, horseshoe kidney, fibroids who presented with severe right hip pain since yesterday evening.  No history of trauma to the area.  No falls.  She denied fever, chills, rhinorrhea, sore throat, wheezing or hemoptysis.  No chest pain, palpitations, diaphoresis, PND, orthopnea or pitting edema of the lower extremities.  No abdominal pain, nausea, emesis, diarrhea, constipation, melena or hematochezia.  No flank pain, dysuria, frequency or hematuria.  No polyuria, polydipsia, polyphagia or blurred vision.   Lab work: CBC showed a white count of 4.0, hemoglobin 7.4 g/dL platelets 366.  BMP was normal.   Imaging: Right hip x-ray was negative for fracture or dislocation.  CT of the right hip showed a rounded lytic lesion within the right femoral head measuring 14 mm with cortical breakthrough superiorly which corresponds to the hypermetabolic lesion seen on the previous PET scan.  Additional 6 mm Disprin chronic rounded lucent lesion anteriorly about the femoral head neck junction favored to be a benign synovial herniation although metastatic lesion is not totally excluded.  Mild right hip osteoarthritis.  No fracture or dislocation.  Fibroid uterus.  CT of the right femur did not show any other acute or destructive lesions within the remainder of the right femur.  There was mild right hip and knee osteoarthritis.   ED course:  Initial vital signs were temperature 98.2 F, pulse 70, respiration 15, BP 105/52 mmHg O2 sat 100% on room air. She received morphine 4 mg IVP x 1.  Review of Systems: As mentioned in the history of present illness. All other systems reviewed and are negative. Past Medical History:  Diagnosis Date   Chronic left shoulder pain    Headache    History of external beam radiation therapy    07-31-2017  to  09-10-2017  LEft chest wass 50 gy in 25 fractions, Left Supraclavicular 45 Gy in 25 fractions, Left Chest wall scar boost 10 Gy in 5 fractions.   Horseshoe kidney    Malignant neoplasm of upper-inner quadrant of left breast in female, estrogen receptor positive (HCC) 04/18/2017   oncologist--- dr Mosetta Putt;   dx 06/ 2018 ,  Stage IIB, ER/PR+, G3, IDC/ DCIS;  06-13-2017 s/p left mastectomy w/ node dissection ;  completed radiation 09-10-2017;   recurrence w/ mets to bone 07/ 2024 , started chemotherapy   Metastatic cancer to bone (HCC) 05/2023   Uterine fibroid    Past Surgical History:  Procedure Laterality Date   APPENDECTOMY  2005   COLONOSCOPY  2016   GANGLION CYST EXCISION Right 09/24/2017   Procedure: EXCISION OF RIGHT DORSAL WRIST GANGLION;  Surgeon: Mack Hook, MD;  Location: [facility];  Service: Orthopedics;  Laterality: Right;   LAPAROSCOPIC SALPINGO OOPHERECTOMY Bilateral 08/24/2023   Procedure: LAPAROSCOPIC SALPINGO OOPHORECTOMY;  Surgeon: [name], DO;  Location: [facility];  Service: Gynecology;  Laterality: Bilateral;   LIPOMA EXCISION Left 06/13/2017   Procedure: EXCISION 3CM LIPOMA  ON LEFT ARM;  Surgeon: Griselda Miner, MD;  Location: [facility] OR;  Service: General;  Laterality: Left;   MASTECTOMY W/ SENTINEL NODE BIOPSY Left 06/13/2017   Procedure: MASTECTOMY WITH SENTINEL LYMPH NODE BIOPSY;  Surgeon: Griselda Miner, MD;  Location: [facility] OR;  Service: General;  Laterality: Left;   Social History:  reports that she has been smoking cigarettes. She  started smoking about 38 years ago. She has a 8 pack-year smoking history. She has never used smokeless tobacco. She reports that she does not drink alcohol and does not use drugs.  No Known Allergies  Family History  Problem Relation Age of Onset   Colon cancer Brother     Prior to Admission medications   Medication Sig Start Date End Date Taking? Authorizing Provider  abemaciclib (VERZENIO) 100 MG tablet Take 1 tablet (100 mg total) by mouth 2 (two) times daily. 08/22/23  Yes Malachy Mood, MD  ibuprofen (ADVIL) 600 MG tablet Take 1 tablet (600 mg total) by mouth every 6 (six) hours as needed. 08/24/23  Yes Morris, Megan, DO  gabapentin (NEURONTIN) 100 MG capsule Take 1 capsule (100 mg total) by mouth at bedtime. If tolerates well, OK to increase to 2 or 3 cap at night as needed 09/03/23   Malachy Mood, MD  letrozole [facility]) 2.5 MG tablet Take 1 tablet (2.5 mg total) by mouth daily. Patient taking differently: Take 2.5 mg by mouth daily. Patient to start 10/01/23 09/03/23   Malachy Mood, MD  loperamide (IMODIUM A-D) 2 MG tablet Take 2 mg by mouth 4 (four) times daily as needed for diarrhea or loose stools.    [provider]  Multiple Vitamins-Minerals (MULTIVITAMIN WOMEN PO) Take by mouth daily.    [provider]  Nutritional Supplements (CHLORELLA-SPIRULINA COMPLEX PO) Take by mouth as needed.    [provider]    Physical Exam: Vitals:   09/29/23 1155 09/29/23 1716  BP: (!) 105/52 (!) 117/59  Pulse: 70 76  Resp: 15 20  Temp: 98.2 F (36.8 C) 98.5 F (36.9 C)  TempSrc:  Oral  SpO2: 100% 97%  Weight: 55.3 kg   Height: 5\' 3"  (1.6 m)    Physical Exam Vitals and nursing note reviewed.  Constitutional:      General: She is awake. She is not in acute distress.    Appearance: Normal appearance. She is normal weight. She is ill-appearing.  HENT:     Head: Normocephalic.     Nose: No rhinorrhea.     Mouth/Throat:     Mouth: Mucous membranes are moist.  Eyes:      General: No scleral icterus.    Pupils: Pupils are equal, round, and reactive to light.  Neck:     Vascular: No JVD.  Cardiovascular:     Rate and Rhythm: Normal rate and regular rhythm.     Heart sounds: S1 normal and S2 normal.  Pulmonary:     Effort: Pulmonary effort is normal.     Breath sounds: Normal breath sounds. No wheezing, rhonchi or rales.  Abdominal:     General: Bowel sounds are normal. There is no distension.     Palpations: Abdomen is soft.     Tenderness: There is no abdominal tenderness.  Musculoskeletal:     Cervical back: Neck supple.     Right hip: Tenderness present. Decreased range of motion. Decreased strength.     Right lower leg: No edema.     Left lower leg: No edema.  Skin:    General: Skin is warm and dry.  Neurological:     General: No focal deficit present.     Mental Status: She is alert and oriented to person, place, and time.  Psychiatric:        Mood and Affect: Mood normal.        Behavior: Behavior normal. Behavior is cooperative.     Data Reviewed:  There are no new results to review at this time.  Assessment and Plan: Principal Problem:   Intractable pain In the setting of:   Metastasis to bone (HCC) Observation/MedSurg. Continue IV fluids. Keep n.p.o. after midnight. Analgesics as needed. Antiemetics as needed. Follow CBC, CMP in AM. Orthopedic surgery input appreciated. -No new lesions on CT of the femur.  Active Problems:   Malignant neoplasm of upper-inner quadrant  of left breast in female, estrogen receptor positive (HCC) Follow-up at the cancer center with Dr. Mosetta Putt Continue Verzenio 100 mg p.o. twice daily.    Normocytic anemia Monitor hematocrit and hemoglobin.    Advance Care Planning:   Code Status: Full Code   Consults: Orthopedic surgery (Dr. Odis Hollingshead).  Family Communication: Her husband was at bedside.  Severity of Illness: The appropriate patient status for this patient is OBSERVATION.  Observation status is judged to be reasonable and necessary in order to provide the required intensity of service to ensure the patient's safety. The patient's presenting symptoms, physical exam findings, and initial radiographic and laboratory data in the context of their medical condition is felt to place them at decreased risk for further clinical deterioration. Furthermore, it is anticipated that the patient will be medically stable for discharge from the hospital within 2 midnights of admission.   Author: Bobette Mo, MD 09/29/2023 6:01 PM  For on call review [email or web address].  This document was prepared using Dragon voice recognition software and may contain some unintended transcription errors.

## 2023-09-30 ENCOUNTER — Encounter (HOSPITAL_COMMUNITY)
Admission: EM | Disposition: A | Discharge status: Home / Self Care | Payer: Self-pay | Source: Home / Self Care | Attending: Internal Medicine

## 2023-09-30 ENCOUNTER — Inpatient Hospital Stay (HOSPITAL_COMMUNITY): Payer: BC Managed Care – PPO | Admitting: Anesthesiology

## 2023-09-30 ENCOUNTER — Inpatient Hospital Stay (HOSPITAL_COMMUNITY): Payer: BC Managed Care – PPO

## 2023-09-30 ENCOUNTER — Other Ambulatory Visit: Payer: Self-pay

## 2023-09-30 DIAGNOSIS — D6489 Other specified anemias: Secondary | ICD-10-CM | POA: Diagnosis present

## 2023-09-30 DIAGNOSIS — Z7901 Long term (current) use of anticoagulants: Secondary | ICD-10-CM | POA: Diagnosis not present

## 2023-09-30 DIAGNOSIS — C7951 Secondary malignant neoplasm of bone: Secondary | ICD-10-CM | POA: Diagnosis present

## 2023-09-30 DIAGNOSIS — Z90722 Acquired absence of ovaries, bilateral: Secondary | ICD-10-CM | POA: Diagnosis not present

## 2023-09-30 DIAGNOSIS — Z7969 Long term (current) use of other immunomodulators and immunosuppressants: Secondary | ICD-10-CM | POA: Diagnosis not present

## 2023-09-30 DIAGNOSIS — F17210 Nicotine dependence, cigarettes, uncomplicated: Secondary | ICD-10-CM | POA: Diagnosis present

## 2023-09-30 DIAGNOSIS — Z96641 Presence of right artificial hip joint: Secondary | ICD-10-CM

## 2023-09-30 DIAGNOSIS — D630 Anemia in neoplastic disease: Secondary | ICD-10-CM | POA: Diagnosis present

## 2023-09-30 DIAGNOSIS — M8580 Other specified disorders of bone density and structure, unspecified site: Secondary | ICD-10-CM | POA: Diagnosis not present

## 2023-09-30 DIAGNOSIS — M898X5 Other specified disorders of bone, thigh: Secondary | ICD-10-CM | POA: Diagnosis not present

## 2023-09-30 DIAGNOSIS — Z923 Personal history of irradiation: Secondary | ICD-10-CM | POA: Diagnosis not present

## 2023-09-30 DIAGNOSIS — R52 Pain, unspecified: Secondary | ICD-10-CM | POA: Diagnosis not present

## 2023-09-30 DIAGNOSIS — Z800 Family history of malignant neoplasm of digestive organs: Secondary | ICD-10-CM | POA: Diagnosis not present

## 2023-09-30 DIAGNOSIS — Z7982 Long term (current) use of aspirin: Secondary | ICD-10-CM | POA: Diagnosis not present

## 2023-09-30 DIAGNOSIS — D72819 Decreased white blood cell count, unspecified: Secondary | ICD-10-CM | POA: Diagnosis not present

## 2023-09-30 DIAGNOSIS — Z9012 Acquired absence of left breast and nipple: Secondary | ICD-10-CM | POA: Diagnosis not present

## 2023-09-30 DIAGNOSIS — Z471 Aftercare following joint replacement surgery: Secondary | ICD-10-CM | POA: Diagnosis not present

## 2023-09-30 DIAGNOSIS — D259 Leiomyoma of uterus, unspecified: Secondary | ICD-10-CM | POA: Diagnosis present

## 2023-09-30 DIAGNOSIS — Z9889 Other specified postprocedural states: Secondary | ICD-10-CM | POA: Diagnosis not present

## 2023-09-30 DIAGNOSIS — R2681 Unsteadiness on feet: Secondary | ICD-10-CM | POA: Diagnosis not present

## 2023-09-30 DIAGNOSIS — C50212 Malignant neoplasm of upper-inner quadrant of left female breast: Secondary | ICD-10-CM | POA: Diagnosis present

## 2023-09-30 DIAGNOSIS — Z853 Personal history of malignant neoplasm of breast: Secondary | ICD-10-CM | POA: Diagnosis not present

## 2023-09-30 DIAGNOSIS — Q631 Lobulated, fused and horseshoe kidney: Secondary | ICD-10-CM | POA: Diagnosis not present

## 2023-09-30 DIAGNOSIS — M25551 Pain in right hip: Secondary | ICD-10-CM | POA: Diagnosis present

## 2023-09-30 DIAGNOSIS — Z79899 Other long term (current) drug therapy: Secondary | ICD-10-CM | POA: Diagnosis not present

## 2023-09-30 DIAGNOSIS — G893 Neoplasm related pain (acute) (chronic): Secondary | ICD-10-CM | POA: Diagnosis present

## 2023-09-30 DIAGNOSIS — M25512 Pain in left shoulder: Secondary | ICD-10-CM | POA: Diagnosis present

## 2023-09-30 DIAGNOSIS — D6481 Anemia due to antineoplastic chemotherapy: Secondary | ICD-10-CM | POA: Diagnosis present

## 2023-09-30 DIAGNOSIS — M89251 Other disorders of bone development and growth, right femur: Secondary | ICD-10-CM | POA: Diagnosis not present

## 2023-09-30 DIAGNOSIS — Z170 Estrogen receptor positive status [ER+]: Secondary | ICD-10-CM | POA: Diagnosis not present

## 2023-09-30 DIAGNOSIS — Z9049 Acquired absence of other specified parts of digestive tract: Secondary | ICD-10-CM | POA: Diagnosis not present

## 2023-09-30 HISTORY — PX: TOTAL HIP ARTHROPLASTY: SHX124

## 2023-09-30 LAB — COMPREHENSIVE METABOLIC PANEL
ALT: 19 U/L (ref 0–44)
AST: 17 U/L (ref 15–41)
Albumin: 3.5 g/dL (ref 3.5–5.0)
Alkaline Phosphatase: 44 U/L (ref 38–126)
Anion gap: 7 (ref 5–15)
BUN: 17 mg/dL (ref 6–20)
CO2: 25 mmol/L (ref 22–32)
Calcium: 8.7 mg/dL — ABNORMAL LOW (ref 8.9–10.3)
Chloride: 106 mmol/L (ref 98–111)
Creatinine, Ser: 0.87 mg/dL (ref 0.44–1.00)
GFR, Estimated: >60 mL/min (ref >60)
Glucose, Bld: 85 mg/dL (ref 70–99)
Potassium: 3.7 mmol/L (ref 3.5–5.1)
Sodium: 138 mmol/L (ref 135–145)
Total Bilirubin: 0.6 mg/dL (ref <1.2)
Total Protein: 6.5 g/dL (ref 6.5–8.1)

## 2023-09-30 LAB — CBC
HCT: 31.2 % — ABNORMAL LOW (ref 36.0–46.0)
Hemoglobin: 10.3 g/dL — ABNORMAL LOW (ref 12.0–15.0)
MCH: 33.2 pg (ref 26.0–34.0)
MCHC: 33 g/dL (ref 30.0–36.0)
MCV: 100.6 fL — ABNORMAL HIGH (ref 80.0–100.0)
Platelets: 214 10*3/uL (ref 150–400)
RBC: 3.1 MIL/uL — ABNORMAL LOW (ref 3.87–5.11)
RDW: 15.7 % — ABNORMAL HIGH (ref 11.5–15.5)
WBC: 2.7 10*3/uL — ABNORMAL LOW (ref 4.0–10.5)
nRBC: 0 % (ref 0.0–0.2)

## 2023-09-30 LAB — HIV ANTIBODY (ROUTINE TESTING W REFLEX): HIV Screen 4th Generation wRfx: NONREACTIVE

## 2023-09-30 SURGERY — ARTHROPLASTY, HIP, TOTAL, ANTERIOR APPROACH
Anesthesia: Spinal | Site: Hip | Laterality: Right

## 2023-09-30 MED ORDER — POVIDONE-IODINE 10 % EX SWAB: 2.0000 | Freq: Once | CUTANEOUS | Status: DC

## 2023-09-30 MED ORDER — MEPERIDINE HCL 50 MG/ML IJ SOLN
6.2500 mg | INTRAMUSCULAR | Status: DC | PRN
Start: 2023-09-30 — End: 2023-09-30

## 2023-09-30 MED ORDER — PHENOL 1.4 % MT LIQD: 1.0000 | OROMUCOSAL | Status: DC | PRN

## 2023-09-30 MED ORDER — METOCLOPRAMIDE HCL 5 MG PO TABS: 5.0000 mg | ORAL_TABLET | Freq: Three times a day (TID) | ORAL | Status: DC | PRN

## 2023-09-30 MED ORDER — METOCLOPRAMIDE HCL 5 MG/ML IJ SOLN: 5.0000 mg | Freq: Three times a day (TID) | INTRAMUSCULAR | Status: DC | PRN

## 2023-09-30 MED ORDER — HYDROMORPHONE HCL 1 MG/ML IJ SOLN: 0.5000 mg | INTRAMUSCULAR | Status: DC | PRN

## 2023-09-30 MED ORDER — OXYCODONE HCL 5 MG/5ML PO SOLN: 5.0000 mg | Freq: Once | ORAL | Status: DC | PRN

## 2023-09-30 MED ORDER — TRANEXAMIC ACID-NACL 1000-0.7 MG/100ML-% IV SOLN
1000.0000 mg | Freq: Once | INTRAVENOUS | Status: AC
  Filled 2023-09-30: qty 100

## 2023-09-30 MED ORDER — DIPHENHYDRAMINE HCL 12.5 MG/5ML PO ELIX: 12.5000 mg | ORAL_SOLUTION | ORAL | Status: DC | PRN

## 2023-09-30 MED ORDER — MENTHOL 3 MG MT LOZG: 1.0000 | LOZENGE | OROMUCOSAL | Status: DC | PRN

## 2023-09-30 MED ORDER — SENNOSIDES-DOCUSATE SODIUM 8.6-50 MG PO TABS
1.0000 | ORAL_TABLET | Freq: Two times a day (BID) | ORAL | Status: DC
  Filled 2023-09-30: qty 1

## 2023-09-30 MED ORDER — ONDANSETRON HCL 4 MG PO TABS: 4.0000 mg | ORAL_TABLET | Freq: Four times a day (QID) | ORAL | Status: DC | PRN

## 2023-09-30 MED ORDER — ONDANSETRON HCL 4 MG/2ML IJ SOLN: 4.0000 mg | Freq: Four times a day (QID) | INTRAMUSCULAR | Status: DC | PRN

## 2023-09-30 MED ORDER — HYDROMORPHONE HCL 1 MG/ML IJ SOLN
0.2500 mg | INTRAMUSCULAR | Status: DC | PRN
Start: 2023-09-30 — End: 2023-09-30

## 2023-09-30 MED ORDER — ALUM & MAG HYDROXIDE-SIMETH 200-200-20 MG/5ML PO SUSP: 30.0000 mL | ORAL | Status: DC | PRN

## 2023-09-30 MED ORDER — PROPOFOL 10 MG/ML IV BOLUS
INTRAVENOUS | Status: AC
  Filled 2023-09-30: qty 20

## 2023-09-30 MED ORDER — PHENYLEPHRINE 80 MCG/ML (10ML) SYRINGE FOR IV PUSH (FOR BLOOD PRESSURE SUPPORT)
PREFILLED_SYRINGE | INTRAVENOUS | Status: AC
  Filled 2023-09-30: qty 10

## 2023-09-30 MED ORDER — MIDAZOLAM HCL 2 MG/2ML IJ SOLN: 0.5000 mg | Freq: Once | INTRAMUSCULAR | Status: DC | PRN

## 2023-09-30 MED ORDER — OXYCODONE HCL 5 MG PO TABS
10.0000 mg | ORAL_TABLET | ORAL | Status: DC | PRN
  Administered 2023-09-30: 10 mg via ORAL
  Filled 2023-09-30: qty 2

## 2023-09-30 MED ORDER — CEFAZOLIN SODIUM-DEXTROSE 2-4 GM/100ML-% IV SOLN
2.0000 g | INTRAVENOUS | Status: AC
  Administered 2023-09-30: 2 g via INTRAVENOUS

## 2023-09-30 MED ORDER — METHOCARBAMOL 1000 MG/10ML IJ SOLN: 500.0000 mg | Freq: Four times a day (QID) | INTRAMUSCULAR | Status: DC | PRN

## 2023-09-30 MED ORDER — SODIUM CHLORIDE 0.9% FLUSH
10.0000 mL | Freq: Two times a day (BID) | INTRAVENOUS | Status: DC
  Administered 2023-09-30 – 2023-10-02 (×4): 10 mL via INTRAVENOUS

## 2023-09-30 MED ORDER — BUPIVACAINE-EPINEPHRINE 0.25% -1:200000 IJ SOLN
INTRAMUSCULAR | Status: AC
  Filled 2023-09-30: qty 1

## 2023-09-30 MED ORDER — ONDANSETRON HCL 4 MG/2ML IJ SOLN
INTRAMUSCULAR | Status: DC | PRN
  Administered 2023-09-30: 4 mg via INTRAVENOUS

## 2023-09-30 MED ORDER — PANTOPRAZOLE SODIUM 40 MG PO TBEC
40.0000 mg | DELAYED_RELEASE_TABLET | Freq: Every day | ORAL | Status: DC
  Administered 2023-10-01 – 2023-10-02 (×2): 40 mg via ORAL
  Filled 2023-09-30 (×3): qty 1

## 2023-09-30 MED ORDER — PHENYLEPHRINE HCL (PRESSORS) 10 MG/ML IV SOLN
INTRAVENOUS | Status: AC
  Filled 2023-09-30: qty 1

## 2023-09-30 MED ORDER — SENNA 8.6 MG PO TABS
2.0000 | ORAL_TABLET | Freq: Every day | ORAL | Status: DC
  Administered 2023-09-30 – 2023-10-01 (×2): 17.2 mg via ORAL
  Filled 2023-09-30 (×2): qty 2

## 2023-09-30 MED ORDER — PROPOFOL 1000 MG/100ML IV EMUL
INTRAVENOUS | Status: AC
  Filled 2023-09-30: qty 100

## 2023-09-30 MED ORDER — CEFAZOLIN SODIUM-DEXTROSE 2-4 GM/100ML-% IV SOLN
INTRAVENOUS | Status: AC
  Filled 2023-09-30: qty 100

## 2023-09-30 MED ORDER — BUPIVACAINE IN DEXTROSE 0.75-8.25 % IT SOLN
INTRATHECAL | Status: DC | PRN
  Administered 2023-09-30: 12 mg via INTRATHECAL

## 2023-09-30 MED ORDER — BISACODYL 10 MG RE SUPP: 10.0000 mg | Freq: Every day | RECTAL | Status: DC | PRN

## 2023-09-30 MED ORDER — OXYCODONE HCL 5 MG PO TABS: 5.0000 mg | ORAL_TABLET | ORAL | Status: DC | PRN

## 2023-09-30 MED ORDER — KETOROLAC TROMETHAMINE 30 MG/ML IJ SOLN
INTRAMUSCULAR | Status: AC
  Filled 2023-09-30: qty 1

## 2023-09-30 MED ORDER — SODIUM CHLORIDE (PF) 0.9 % IJ SOLN
INTRAMUSCULAR | Status: AC
  Filled 2023-09-30: qty 50

## 2023-09-30 MED ORDER — PROPOFOL 10 MG/ML IV BOLUS
INTRAVENOUS | Status: DC | PRN
  Administered 2023-09-30: 20 mg via INTRAVENOUS

## 2023-09-30 MED ORDER — CHLORHEXIDINE GLUCONATE 4 % EX SOLN: 60.0000 mL | Freq: Once | CUTANEOUS | Status: DC

## 2023-09-30 MED ORDER — PROPOFOL 500 MG/50ML IV EMUL
INTRAVENOUS | Status: DC | PRN
  Administered 2023-09-30: 90 ug/kg/min via INTRAVENOUS

## 2023-09-30 MED ORDER — LACTATED RINGERS IV SOLN: INTRAVENOUS | Status: AC

## 2023-09-30 MED ORDER — METHOCARBAMOL 500 MG PO TABS
500.0000 mg | ORAL_TABLET | Freq: Four times a day (QID) | ORAL | Status: DC | PRN
  Administered 2023-09-30 – 2023-10-01 (×3): 500 mg via ORAL
  Filled 2023-09-30 (×3): qty 1

## 2023-09-30 MED ORDER — ACETAMINOPHEN 500 MG PO TABS: 1000.0000 mg | ORAL_TABLET | Freq: Four times a day (QID) | ORAL | Status: DC

## 2023-09-30 MED ORDER — ALBUMIN HUMAN 5 % IV SOLN
INTRAVENOUS | Status: AC
  Filled 2023-09-30: qty 250

## 2023-09-30 MED ORDER — CEFAZOLIN SODIUM-DEXTROSE 2-4 GM/100ML-% IV SOLN
2.0000 g | Freq: Four times a day (QID) | INTRAVENOUS | Status: AC
  Administered 2023-09-30 – 2023-10-01 (×2): 2 g via INTRAVENOUS
  Filled 2023-09-30 (×2): qty 100

## 2023-09-30 MED ORDER — TRANEXAMIC ACID-NACL 1000-0.7 MG/100ML-% IV SOLN
1000.0000 mg | INTRAVENOUS | Status: AC
  Administered 2023-09-30: 1000 mg via INTRAVENOUS

## 2023-09-30 MED ORDER — PHENYLEPHRINE 80 MCG/ML (10ML) SYRINGE FOR IV PUSH (FOR BLOOD PRESSURE SUPPORT)
PREFILLED_SYRINGE | INTRAVENOUS | Status: DC | PRN
  Administered 2023-09-30 (×3): 80 ug via INTRAVENOUS

## 2023-09-30 MED ORDER — STERILE WATER FOR IRRIGATION IR SOLN
Status: DC | PRN
  Administered 2023-09-30: 1000 mL

## 2023-09-30 MED ORDER — DEXAMETHASONE SODIUM PHOSPHATE 10 MG/ML IJ SOLN
10.0000 mg | Freq: Once | INTRAMUSCULAR | Status: AC
  Administered 2023-10-01: 10 mg via INTRAVENOUS
  Filled 2023-09-30: qty 1

## 2023-09-30 MED ORDER — ACETAMINOPHEN 10 MG/ML IV SOLN
INTRAVENOUS | Status: DC | PRN
  Administered 2023-09-30: 1000 mg via INTRAVENOUS

## 2023-09-30 MED ORDER — DEXAMETHASONE SODIUM PHOSPHATE 10 MG/ML IJ SOLN
INTRAMUSCULAR | Status: AC
  Filled 2023-09-30: qty 1

## 2023-09-30 MED ORDER — DEXAMETHASONE SODIUM PHOSPHATE 10 MG/ML IJ SOLN
INTRAMUSCULAR | Status: DC | PRN
  Administered 2023-09-30: 8 mg via INTRAVENOUS

## 2023-09-30 MED ORDER — PHENYLEPHRINE HCL-NACL 20-0.9 MG/250ML-% IV SOLN
INTRAVENOUS | Status: DC | PRN
  Administered 2023-09-30: 30 ug/min via INTRAVENOUS

## 2023-09-30 MED ORDER — ASPIRIN 81 MG PO CHEW
81.0000 mg | CHEWABLE_TABLET | Freq: Two times a day (BID) | ORAL | Status: DC
  Administered 2023-09-30 – 2023-10-01 (×2): 81 mg via ORAL
  Filled 2023-09-30 (×2): qty 1

## 2023-09-30 MED ORDER — 0.9 % SODIUM CHLORIDE (POUR BTL) OPTIME
TOPICAL | Status: DC | PRN
  Administered 2023-09-30: 1000 mL

## 2023-09-30 MED ORDER — ONDANSETRON HCL 4 MG/2ML IJ SOLN
INTRAMUSCULAR | Status: AC
  Filled 2023-09-30: qty 2

## 2023-09-30 MED ORDER — MIDAZOLAM HCL 2 MG/2ML IJ SOLN
INTRAMUSCULAR | Status: AC
Start: 2023-09-30 — End: ?
  Filled 2023-09-30: qty 2

## 2023-09-30 MED ORDER — ACETAMINOPHEN 500 MG PO TABS
1000.0000 mg | ORAL_TABLET | Freq: Four times a day (QID) | ORAL | Status: DC
  Administered 2023-09-30 – 2023-10-02 (×7): 1000 mg via ORAL
  Filled 2023-09-30 (×7): qty 2

## 2023-09-30 MED ORDER — MIDAZOLAM HCL 2 MG/2ML IJ SOLN
INTRAMUSCULAR | Status: DC | PRN
  Administered 2023-09-30: 2 mg via INTRAVENOUS

## 2023-09-30 MED ORDER — OXYCODONE HCL 5 MG PO TABS: 5.0000 mg | ORAL_TABLET | Freq: Once | ORAL | Status: DC | PRN

## 2023-09-30 MED ORDER — ACETAMINOPHEN 10 MG/ML IV SOLN
INTRAVENOUS | Status: AC
  Filled 2023-09-30: qty 100

## 2023-09-30 MED ORDER — ALBUMIN HUMAN 5 % IV SOLN
12.5000 g | Freq: Once | INTRAVENOUS | Status: AC
  Administered 2023-09-30: 12.5 g via INTRAVENOUS

## 2023-09-30 MED ORDER — POLYETHYLENE GLYCOL 3350 17 G PO PACK
17.0000 g | PACK | Freq: Every day | ORAL | Status: DC
  Administered 2023-09-30 – 2023-10-02 (×3): 17 g via ORAL
  Filled 2023-09-30 (×4): qty 1

## 2023-09-30 MED ORDER — SODIUM CHLORIDE (PF) 0.9 % IJ SOLN
INTRAMUSCULAR | Status: DC | PRN
  Administered 2023-09-30: 61 mL

## 2023-09-30 MED ORDER — TRANEXAMIC ACID-NACL 1000-0.7 MG/100ML-% IV SOLN
INTRAVENOUS | Status: AC
  Administered 2023-09-30: 1000 mg via INTRAVENOUS
  Filled 2023-09-30: qty 100

## 2023-09-30 SURGICAL SUPPLY — 42 items
BAG COUNTER SPONGE SURGICOUNT (BAG) IMPLANT
BAG ZIPLOCK 12X15 (MISCELLANEOUS) IMPLANT
BALL HIP CERAMIC (Hips) IMPLANT
BLADE SAG 18X100X1.27 (BLADE) ×1 IMPLANT
COVER PERINEAL POST (MISCELLANEOUS) ×1 IMPLANT
COVER SURGICAL LIGHT HANDLE (MISCELLANEOUS) ×1 IMPLANT
CUP ACET PINNACLE SECTR 50MM (Hips) IMPLANT
DERMABOND ADVANCED .7 DNX12 (GAUZE/BANDAGES/DRESSINGS) ×1 IMPLANT
DRAPE FOOT SWITCH (DRAPES) ×1 IMPLANT
DRAPE STERI IOBAN 125X83 (DRAPES) ×1 IMPLANT
DRAPE U-SHAPE 47X51 STRL (DRAPES) ×2 IMPLANT
DRESSING AQUACEL AG SP 3.5X10 (GAUZE/BANDAGES/DRESSINGS) ×1 IMPLANT
DRSG AQUACEL AG ADV 3.5X10 (GAUZE/BANDAGES/DRESSINGS) IMPLANT
DRSG AQUACEL AG SP 3.5X10 (GAUZE/BANDAGES/DRESSINGS) ×1
DURAPREP 26ML APPLICATOR (WOUND CARE) ×1 IMPLANT
ELECT REM PT RETURN 15FT ADLT (MISCELLANEOUS) ×1 IMPLANT
GLOVE BIO SURGEON STRL SZ 6 (GLOVE) ×1 IMPLANT
GLOVE BIOGEL PI IND STRL 6.5 (GLOVE) ×1 IMPLANT
GLOVE BIOGEL PI IND STRL 7.5 (GLOVE) ×1 IMPLANT
GLOVE ORTHO TXT STRL SZ7.5 (GLOVE) ×2 IMPLANT
GOWN STRL REUS W/ TWL LRG LVL3 (GOWN DISPOSABLE) ×2 IMPLANT
GOWN STRL REUS W/TWL LRG LVL3 (GOWN DISPOSABLE) ×2
HIP BALL CERAMIC (Hips) ×1 IMPLANT
HOLDER FOLEY CATH W/STRAP (MISCELLANEOUS) ×1 IMPLANT
KIT TURNOVER KIT A (KITS) IMPLANT
LINER ACET PNNCL PLUS4 NEUTRAL (Hips) IMPLANT
NDL SAFETY ECLIPSE 18X1.5 (NEEDLE) IMPLANT
PACK ANTERIOR HIP CUSTOM (KITS) ×1 IMPLANT
PINNACLE PLUS 4 NEUTRAL (Hips) ×1 IMPLANT
PINNACLE SECTOR CUP 50MM (Hips) ×1 IMPLANT
SCREW 6.5MMX25MM (Screw) IMPLANT
STEM FEM ACTIS HIGH SZ2 (Stem) IMPLANT
SUT MNCRL AB 4-0 PS2 18 (SUTURE) ×1 IMPLANT
SUT STRATAFIX 0 PDS 27 VIOLET (SUTURE) ×1
SUT VIC AB 1 CT1 36 (SUTURE) ×3 IMPLANT
SUT VIC AB 2-0 CT1 27 (SUTURE) ×2
SUT VIC AB 2-0 CT1 TAPERPNT 27 (SUTURE) ×2 IMPLANT
SUTURE STRATFX 0 PDS 27 VIOLET (SUTURE) ×1 IMPLANT
SYR 3ML LL SCALE MARK (SYRINGE) IMPLANT
TRAY FOLEY MTR SLVR 16FR STAT (SET/KITS/TRAYS/PACK) IMPLANT
TUBE SUCTION HIGH CAP CLEAR NV (SUCTIONS) ×1 IMPLANT
WATER STERILE IRR 1000ML POUR (IV SOLUTION) ×1 IMPLANT

## 2023-09-30 NOTE — Consult Note (Signed)
Reason for Consult: Right hip pain in setting of metastatic breast cancer Referring Physician: Sunnie Nielsen, MD (Hospitalist)  Kristina Nicholson is an 57 y.o. female.  HPI: 57 year old female with known history of breast cancer including recent diagnosis of metastatic lesion to her left proximal humerus as well as diagnosis of a right femoral head lesion by PET scan.  She presented to the hospital with increasing pain involving her right hip.  Radiographic evaluation of her right hip including plain films and CT scan revealed concerns for a lytic lesion involving the superior aspect of her femoral head with penetration of the femoral head cortex. She does have complaints of left shoulder pain.  Past Medical History:  Diagnosis Date   Chronic left shoulder pain    Headache    History of external beam radiation therapy    07-31-2017  to  09-10-2017  LEft chest wass 50 gy in 25 fractions, Left Supraclavicular 45 Gy in 25 fractions, Left Chest wall scar boost 10 Gy in 5 fractions.   Horseshoe kidney    Malignant neoplasm of upper-inner quadrant of left breast in female, estrogen receptor positive (HCC) 04/18/2017   oncologist--- dr Mosetta Putt;   dx 06/ 2018 ,  Stage IIB, ER/PR+, G3, IDC/ DCIS;  06-13-2017 s/p left mastectomy w/ node dissection ;  completed radiation 09-10-2017;   recurrence w/ mets to bone 07/ 2024 , started chemotherapy   Metastatic cancer to bone (HCC) 05/2023   Uterine fibroid     Past Surgical History:  Procedure Laterality Date   APPENDECTOMY  2005   COLONOSCOPY  2016   GANGLION CYST EXCISION Right 09/24/2017   Procedure: EXCISION OF RIGHT DORSAL WRIST GANGLION;  Surgeon: Mack Hook, MD;  Location: [facility];  Service: Orthopedics;  Laterality: Right;   LAPAROSCOPIC SALPINGO OOPHERECTOMY Bilateral 08/24/2023   Procedure: LAPAROSCOPIC SALPINGO OOPHORECTOMY;  Surgeon: [name], DO;  Location: [facility];  Service: Gynecology;   Laterality: Bilateral;   LIPOMA EXCISION Left 06/13/2017   Procedure: EXCISION 3CM LIPOMA ON LEFT ARM;  Surgeon: Griselda Miner, MD;  Location: [facility] OR;  Service: General;  Laterality: Left;   MASTECTOMY W/ SENTINEL NODE BIOPSY Left 06/13/2017   Procedure: MASTECTOMY WITH SENTINEL LYMPH NODE BIOPSY;  Surgeon: Griselda Miner, MD;  Location: [facility] OR;  Service: General;  Laterality: Left;    Family History  Problem Relation Age of Onset   Colon cancer Brother     Social History:  reports that she has been smoking cigarettes. She started smoking about 38 years ago. She has a 8 pack-year smoking history. She has never used smokeless tobacco. She reports that she does not drink alcohol and does not use drugs.  Allergies: No Known Allergies  Medications: I have reviewed the patient's current medications. Scheduled:  pantoprazole  40 mg Oral Daily    Results for orders placed or performed during the hospital encounter of 09/29/23 (from the past 24 hour(s))  CBC with Differential/Platelet     Status: Abnormal   Collection Time: 09/29/23 12:54 PM  Result Value Ref Range   WBC 4.0 4.0 - 10.5 K/uL   RBC 3.07 (L) 3.87 - 5.11 MIL/uL   Hemoglobin 10.4 (L) 12.0 - 15.0 g/dL   HCT 16.1 (L) 09.6 - 04.5 %   MCV 98.4 80.0 - 100.0 fL   MCH 33.9 26.0 - 34.0 pg   MCHC 34.4 30.0 - 36.0 g/dL   RDW 40.9 (H) 81.1 - 91.4 %   Platelets  214 150 - 400 K/uL   nRBC 0.0 0.0 - 0.2 %   Neutrophils Relative % 60 %   Neutro Abs 2.4 1.7 - 7.7 K/uL   Lymphocytes Relative 29 %   Lymphs Abs 1.2 0.7 - 4.0 K/uL   Monocytes Relative 8 %   Monocytes Absolute 0.3 0.1 - 1.0 K/uL   Eosinophils Relative 2 %   Eosinophils Absolute 0.1 0.0 - 0.5 K/uL   Basophils Relative 1 %   Basophils Absolute 0.0 0.0 - 0.1 K/uL   Immature Granulocytes 0 %   Abs Immature Granulocytes 0.01 0.00 - 0.07 K/uL  Basic metabolic panel     Status: None   Collection Time: 09/29/23 12:54 PM  Result Value Ref Range   Sodium 137 135 - 145 mmol/L    Potassium 3.6 3.5 - 5.1 mmol/L   Chloride 101 98 - 111 mmol/L   CO2 28 22 - 32 mmol/L   Glucose, Bld 93 70 - 99 mg/dL   BUN 15 6 - 20 mg/dL   Creatinine, Ser 1.61 0.44 - 1.00 mg/dL   Calcium 8.9 8.9 - 09.6 mg/dL   GFR, Estimated >04 >54 mL/min   Anion gap 8 5 - 15  CBC     Status: Abnormal   Collection Time: 09/30/23  3:55 AM  Result Value Ref Range   WBC 2.7 (L) 4.0 - 10.5 K/uL   RBC 3.10 (L) 3.87 - 5.11 MIL/uL   Hemoglobin 10.3 (L) 12.0 - 15.0 g/dL   HCT 09.8 (L) 11.9 - 14.7 %   MCV 100.6 (H) 80.0 - 100.0 fL   MCH 33.2 26.0 - 34.0 pg   MCHC 33.0 30.0 - 36.0 g/dL   RDW 82.9 (H) 56.2 - 13.0 %   Platelets 214 150 - 400 K/uL   nRBC 0.0 0.0 - 0.2 %  Comprehensive metabolic panel     Status: Abnormal   Collection Time: 09/30/23  3:55 AM  Result Value Ref Range   Sodium 138 135 - 145 mmol/L   Potassium 3.7 3.5 - 5.1 mmol/L   Chloride 106 98 - 111 mmol/L   CO2 25 22 - 32 mmol/L   Glucose, Bld 85 70 - 99 mg/dL   BUN 17 6 - 20 mg/dL   Creatinine, Ser 8.65 0.44 - 1.00 mg/dL   Calcium 8.7 (L) 8.9 - 10.3 mg/dL   Total Protein 6.5 6.5 - 8.1 g/dL   Albumin 3.5 3.5 - 5.0 g/dL   AST 17 15 - 41 U/L   ALT 19 0 - 44 U/L   Alkaline Phosphatase 44 38 - 126 U/L   Total Bilirubin 0.6 <1.2 mg/dL   GFR, Estimated >78 >46 mL/min   Anion gap 7 5 - 15     X-ray: CLINICAL DATA:  Right hip pain beginning yesterday. No known injury.   EXAM: DG HIP (WITH OR WITHOUT PELVIS) 2-3V RIGHT   COMPARISON:  None Available.   FINDINGS: There is no evidence of hip fracture or dislocation. There is no evidence of arthropathy or other significant abnormality   IMPRESSION: Negative.     Electronically Signed   By: Danae Orleans M.D.  CLINICAL DATA:  Right hip pain.  History of breast cancer   EXAM: CT OF THE RIGHT HIP WITHOUT CONTRAST   TECHNIQUE: Multidetector CT imaging of the right hip was performed according to the standard protocol. Multiplanar CT image reconstructions were also  generated.   RADIATION DOSE REDUCTION: This exam was performed according to  the departmental dose-optimization program which includes automated exposure control, adjustment of the mA and/or kV according to patient size and/or use of iterative reconstruction technique.   COMPARISON:  X-ray 09/29/2023.  PET-CT 06/19/2023   FINDINGS: Bones/Joint/Cartilage   Rounded lytic lesion in the anterosuperior aspect of the right femoral head measuring 14 mm in diameter. There is cortical breakthrough superiorly. This lesion has partially sclerotic margins. Additionally there is a 6 mm circumscribed rounded lucent lesion anteriorly at the femoral head-neck junction with well-defined sclerotic margins. No additional lytic or sclerotic bone lesions. No fracture or dislocation. Mild right hip joint space narrowing. No hip joint effusion is evident.   Ligaments   Suboptimally assessed by CT.   Muscles and Tendons   No acute musculotendinous abnormality by CT.   Soft tissues   No soft tissue swelling or fluid collections. Fibroid uterus. No inguinal lymphadenopathy.   IMPRESSION: 1. Rounded lytic lesion within the right femoral head measuring 14 mm with cortical breakthrough superiorly. This corresponds to hypermetabolic lesion seen on the previous PET-CT and is compatible with metastatic disease in the setting of known breast cancer. 2. Additional 6 mm circumscribed rounded lucent lesion anteriorly at the femoral head-neck junction. This is favored to represent a benign synovial herniation pit although an additional tiny metastatic lesion is not excluded. 3. Mild right hip osteoarthritis.  No fracture or dislocation. 4. Fibroid uterus.     Electronically Signed   By: Duanne Guess D.O.  CLINICAL DATA:  Subsequent treatment strategy for invasive breast cancer diagnosed in 2018 with mastectomy and radiation therapy. Left shoulder pain.   EXAM: NUCLEAR MEDICINE PET SKULL BASE TO  THIGH   TECHNIQUE: 6.23 mCi F-18 FDG was injected intravenously. Full-ring PET imaging was performed from the skull base to thigh after the radiotracer. CT data was obtained and used for attenuation correction and anatomic localization.   Fasting blood glucose: 90 mg/dl   COMPARISON:  None recent. Abdominopelvic CT 07/16/2014. Pelvic MRI 07/31/2014. Unilateral right mammography 10/02/2022.   FINDINGS: Mediastinal blood pool activity: SUV max 2.0   NECK:   No hypermetabolic cervical lymph nodes are identified.Fairly symmetric activity within the lymphoid tissue of Waldeyer's ring is within physiologic limits. No suspicious activity identified within the pharyngeal mucosal space.   Incidental CT findings: none   CHEST:   There are no hypermetabolic mediastinal, hilar, axillary or internal mammary lymph nodes. No hypermetabolic pulmonary activity or suspicious nodularity.   Incidental CT findings: Status post left mastectomy and axillary node dissection. Mild coronary artery atherosclerosis. Mild subpleural fibrosis anteriorly in the left upper lobe attributed to prior radiation therapy.   ABDOMEN/PELVIS:   There is no hypermetabolic activity within the liver, adrenal glands, spleen or pancreas. There is no hypermetabolic nodal activity in the abdomen or pelvis.   Incidental CT findings: There are scattered hypodense lesions within the liver which have enlarged compared with the remote CT, although demonstrate no hypermetabolic activity and are probable cysts. Underlying hepatic steatosis. Horseshoe kidney noted. Mild iliac atherosclerosis. Uterine fibroids, similar to previous MRI.   SKELETON:   There are multiple hypermetabolic osseous lesions consistent with metastatic disease. There are prominent lesions in the left humeral head (SUV max 10.9) and the left scapular body (SUV max 8.7). There are also prominent lesions within the C2 vertebral body (SUV max 5.7),  the T12 vertebral body and the right femoral head. No chest wall or other soft tissue recurrence identified.   Incidental CT findings: No definite pathologic fracture or  epidural tumor.   IMPRESSION: 1. Multifocal hypermetabolic osseous metastatic disease as described. No definite pathologic fracture or epidural tumor identified. There are lesions within the left humeral head and left scapula which may contribute to the patient's left shoulder pain. 2. No other evidence of metastatic disease. 3. Probable hepatic cysts. 4. Horseshoe kidney and uterine fibroids.     Electronically Signed   By: Carey Bullocks M.D.  ROS: As per HPI  Blood pressure (!) 99/50, pulse 62, temperature 97.7 F (36.5 C), temperature source Oral, resp. rate 18, height 5\' 3"  (1.6 m), weight 55.3 kg, last menstrual period 06/22/2015, SpO2 97%.  Physical Exam: Constitutional:      General: She is awake. She is not in acute distress.    Appearance: Normal appearance. She is normal weight. She is ill-appearing.  HENT:     Head: Normocephalic.     Nose: No rhinorrhea.     Mouth/Throat:     Mouth: Mucous membranes are moist.  Eyes:     General: No scleral icterus.    Pupils: Pupils are equal, round, and reactive to light.  Neck:     Vascular: No JVD.  Cardiovascular:     Rate and Rhythm: Normal rate and regular rhythm.     Heart sounds: S1 normal and S2 normal.  Pulmonary:     Effort: Pulmonary effort is normal.     Breath sounds: Normal breath sounds. No wheezing, rhonchi or rales.  Abdominal:     General: Bowel sounds are normal. There is no distension.     Palpations: Abdomen is soft.     Tenderness: There is no abdominal tenderness.  Musculoskeletal:     Cervical back: Neck supple.     Right hip: Painful range of motion to the right hip.  Strength limited due to pain    Right lower leg: No edema.     Left lower leg: No edema.  I deferred examination of her left proximal humerus. Skin:     General: Skin is warm and dry.  Neurological:     General: No focal deficit present.     Mental Status: She is alert and oriented to person, place, and time.  Psychiatric:        Mood and Affect: Mood normal.        Behavior: Behavior normal. Behavior is cooperative.   Assessment/Plan: 1.  Likely right femoral head metastatic breast cancer lesion with penetration of superior cortex of the femoral head correlated with pain with activity  Plan: Today we had a lengthy discussion regarding the findings on her radiographic studies of her right hip and femur.  She does have this lytic lesion in the superior aspect of the femoral head with penetration of the femoral head cortex.  Unfortunately I do not feel that there would be any nonsurgical intervention that would provide benefit given the location of this lesion and the current penetration of the femoral head cortex.  I am recommending that she proceed with total hip replacement to remove the femoral head including this lesion.  We reviewed risks of infection DVT dislocation neurovascular injury and the need for future surgeries.  Her husband's father had a history of a right total hip replacement from the 1980s that was complicated by infection.  We discussed this at length to try to alleviate some of their concerns. From a management standpoint of her right femoral head lesion unfortunately the only option available would be to remove this femoral head and performed  total hip replacement.  We reviewed the postoperative course and expectations.  Further medical management of her metastatic disease would be addressed through her primary team. Postoperatively she would likely need to be placed on Xarelto as DVT prophylaxis for at least 2 weeks based on her history of cancer as well as her total hip replacement. We will make her n.p.o. with plans to try to her get to the operating room today.  Shelda Pal 09/30/2023, 7:58 AM

## 2023-09-30 NOTE — Transfer of Care (Signed)
Immediate Anesthesia Transfer of Care Note  Patient: Kristina Nicholson  Procedure(s) Performed: TOTAL HIP ARTHROPLASTY ANTERIOR APPROACH (Right: Hip)  Patient Location: PACU  Anesthesia Type:Spinal  Level of Consciousness: drowsy  Airway & Oxygen Therapy: Patient Spontanous Breathing and Patient connected to face mask oxygen  Post-op Assessment: Report given to RN and Post -op Vital signs reviewed and stable  Post vital signs: Reviewed and stable  Last Vitals:  Vitals Value Taken Time  BP 87/47   Temp    Pulse 69 09/30/23 1419  Resp 20 09/30/23 1419  SpO2 98 % 09/30/23 1419  Vitals shown include unfiled device data.  Last Pain:  Vitals:   09/30/23 1200  TempSrc:   PainSc: 0-No pain         Complications: No notable events documented.

## 2023-09-30 NOTE — Op Note (Signed)
NAME:  Kristina Nicholson                ACCOUNT NO.: [id number]      MEDICAL RECORD NO.: [id number]      FACILITY:  [facility]      PHYSICIAN:  Shelda Pal  DATE OF BIRTH:  04-Apr-1966     DATE OF PROCEDURE:  09/30/2023                                 OPERATIVE REPORT         PREOPERATIVE DIAGNOSIS: Right hip femoral head symptomatic metastatic lesion      POSTOPERATIVE DIAGNOSIS:  Right hip femoral head symptomatic metastatic lesion       PROCEDURE:  Right total hip replacement through an anterior approach   utilizing DePuy THR system, component size 50 mm pinnacle cup, a size 32+4 neutral   Altrex liner, a size 2 Hi Tri Lock stem with a 32+5 delta ceramic   ball.      SURGEON:  Madlyn Frankel. Charlann Boxer, M.D.      ASSISTANT:  Rosalene Billings, PA-C     ANESTHESIA:  Spinal.      SPECIMENS:  right femoral head sent to pathology     COMPLICATIONS:  None.      BLOOD LOSS:  150 cc     DRAINS:  None.      INDICATION OF THE PROCEDURE:  Kristina Nicholson is a 57 y.o. female with history of known metastatic breast cancer who presented to ER with progressively worsening right hip pain.  Radiographs including a CT scan revealed a lytic lesion in the superior femoral head with penetration of the superior femoral head cortical bone.  Based o n these findings and her pain we discussed options.  I did not feal that based on the location of the lesion and her pain that any other option was available other than proceeding with a total hip replacement.  Other options were discussed but none seem to be capable of removing the lesion with long term consequences.  Consent was obtained for   benefit of pain relief and management of the femoral head metastatic lesion.  Specific risks of infection, DVT, component   failure, dislocation, neurovascular injury, and need for revision surgery were reviewed.     PROCEDURE IN DETAIL:  The patient was brought to operative theater.   Once  adequate anesthesia, preoperative antibiotics, 2 gm of Ancef, 1 gm of Tranexamic Acid, and 10 mg of Decadron were administered, the patient was positioned supine on the [employer] table.  Once the patient was safely positioned with adequate padding of boney prominences we predraped out the hip, and used fluoroscopy to confirm orientation of the pelvis.      The right hip was then prepped and draped from proximal iliac crest to   mid thigh with a shower curtain technique.      Time-out was performed identifying the patient, planned procedure, and the appropriate extremity.     An incision was then made 2 cm lateral to the   anterior superior iliac spine extending over the orientation of the   tensor fascia lata muscle and sharp dissection was carried down to the   fascia of the muscle.      The fascia was then incised.  The muscle belly was identified and swept   laterally and retractor placed  along the superior neck.  Following   cauterization of the circumflex vessels and removing some pericapsular   fat, a second cobra retractor was placed on the inferior neck.  A T-capsulotomy was made along the line of the   superior neck to the trochanteric fossa, then extended proximally and   distally.  Tag sutures were placed and the retractors were then placed   intracapsular.  We then identified the trochanteric fossa and   orientation of my neck cut and then made a neck osteotomy with the femur on traction.  The femoral   head was removed without difficulty or complication.  Traction was let   off and retractors were placed posterior and anterior around the   acetabulum.  The femoral head was passed off the table as specimen to confirm metastatic source.     The labrum and foveal tissue were debrided.  I began reaming with a 46 mm   reamer and reamed up to 49 mm reamer with good bony bed preparation and a 50 mm  cup was chosen.  The final 50 mm Pinnacle cup was then impacted under fluoroscopy to  confirm the depth of penetration and orientation with respect to   Abduction and forward flexion.  A screw was placed into the ilium followed by the hole eliminator.  The final   32+4 neutral Altrex liner was impacted with good visualized rim fit.  The cup was positioned anatomically within the acetabular portion of the pelvis.      At this point, the femur was rolled to 100 degrees.  Further capsule was   released off the inferior aspect of the femoral neck.  I then   released the superior capsule proximally.  With the leg in a neutral position the hook was placed laterally   along the femur under the vastus lateralis origin and elevated manually and then held in position using the hook attachment on the bed.  The leg was then extended and adducted with the leg rolled to 100   degrees of external rotation.  Retractors were placed along the medial calcar and posteriorly over the greater trochanter.  Once the proximal femur was fully   exposed, I used a box osteotome to set orientation.  I then began   broaching with the starting chili pepper broach and passed this by hand and then broached up to 2.  With the 2 broach in place I chose a high offset neck and did several trial reductions.  The offset was appropriate, leg lengths   appeared to be equal best matched with the +5 head ball trial confirmed radiographically.   Given these findings, I went ahead and dislocated the hip, repositioned all   retractors and positioned the right hip in the extended and abducted position.  The final 2 Hi Tri Lock stem was   chosen and it was impacted down to the level of neck cut.  Based on this   and the trial reductions, a final 32+5 delta ceramic ball was chosen and   impacted onto a clean and dry trunnion, and the hip was reduced.  The   hip had been irrigated throughout the case again at this point.  I did   reapproximate the superior capsular leaflet to the anterior leaflet   using #1 Vicryl.  The fascia of  the   tensor fascia lata muscle was then reapproximated using #1 Vicryl and #0 Stratafix sutures.  The   remaining wound was closed with 2-0  Vicryl and running 4-0 Monocryl.   The hip was cleaned, dried, and dressed sterilely using Dermabond and   Aquacel dressing.  The patient was then brought   to recovery room in stable condition tolerating the procedure well.    Rosalene Billings, PA-C was present for the entirety of the case involved from   preoperative positioning, perioperative retractor management, general   facilitation of the case, as well as primary wound closure as assistant.            Madlyn Frankel Charlann Boxer, M.D.        09/30/2023 2:08 PM

## 2023-09-30 NOTE — Interval H&P Note (Signed)
History and Physical Interval Note:  09/30/2023 11:54 AM  Kristina Nicholson  has presented today for surgery, with the diagnosis of RIGHT FEMORAL HEAD METASTATIC HEAD.  The various methods of treatment have been discussed with the patient and family. After consideration of risks, benefits and other options for treatment, the patient has consented to  Procedure(s): TOTAL HIP ARTHROPLASTY ANTERIOR APPROACH (Right) as a surgical intervention.  The patient's history has been reviewed, patient examined, no change in status, stable for surgery.  I have reviewed the patient's chart and labs.  Questions were answered to the patient's satisfaction.     Shelda Pal

## 2023-09-30 NOTE — Anesthesia Postprocedure Evaluation (Signed)
Anesthesia Post Note  Patient: Kristina Nicholson  Procedure(s) Performed: TOTAL HIP ARTHROPLASTY ANTERIOR APPROACH (Right: Hip)     Patient location during evaluation: PACU Anesthesia Type: Spinal Level of consciousness: oriented, patient cooperative and awake and alert Pain management: pain level controlled Vital Signs Assessment: post-procedure vital signs reviewed and stable Respiratory status: spontaneous breathing, nonlabored ventilation and respiratory function stable Cardiovascular status: blood pressure returned to baseline and stable Postop Assessment: spinal receding, patient able to bend at knees and no apparent nausea or vomiting Anesthetic complications: no   No notable events documented.  Last Vitals:  Vitals:   09/30/23 1445 09/30/23 1500  BP: (!) 93/46   Pulse: (!) 59 (!) 54  Resp: 16 10  Temp:    SpO2: 100% 100%    Last Pain:  Vitals:   09/30/23 1500  TempSrc:   PainSc: 0-No pain                 [name],E. Jill Stopka

## 2023-09-30 NOTE — Progress Notes (Signed)
PROGRESS NOTE    Kristina Nicholson  BJY:[id number] DOB: 1966/06/10 DOA: 09/29/2023 PCP: Patient, No Pcp Per   Brief Narrative: 57 year old with past medical history significant for chronic left shoulder pain, history of left breast cancer with positive estrogen receptors, status post left mastectomy, metastasis to bone, status post external beam radiation of the left chest mass, horseshoe kidney, who presents with severe right hip pain that is started the  evening prior to admission.  Right hip x-ray was negative for fracture.  CT of the right hip showed rounded lytic lesion within the right femoral head measuring 14 mm with cortical breakthrough superiorly which correspond to hypermetabolic lesion seen on previous PET scan.  Additional 6 mm circumscribed  rounded lucent lesions anteriorly at the femoral  neck junction favored to be benign synovial herniation although metastatic lesion is not totally excluded.     Assessment & Plan:   Principal Problem:   Intractable pain Active Problems:   Malignant neoplasm of upper-inner quadrant of left breast in female, estrogen receptor positive (HCC)   Metastasis to bone (HCC)   Normocytic anemia  1-Right Hip Metastatic Diseases:  Intractable Hip Pain:  -Orthopedic has been consulted.  They are considering arthroplasty during this admission IV morphine PRN, Oxy PRN.  Bowel regimen. Miralax, Senna.  Will need PT post op DVT prophylaxis per ortho   2-Malignant neoplasm of the (ER quadrant of the left breast estrogen positive with metastatic disease, Stage IV:  -Follow-up with Dr. Mosetta Putt -resume   Verzenio tomorrow if ok by Dr Mosetta Putt   Normocytic anemia: Leukopenia Monitor hb  Estimated body mass index is 21.61 kg/m as calculated from the following:   Height as of this encounter: 5\' 3"  (1.6 m).   Weight as of this encounter: 55.3 kg.   DVT prophylaxis: SCD Code Status: Full code Family Communication: Husband at bedside.   Disposition Plan:  Status is: Observation The patient will require care spanning > 2 midnights and should be moved to inpatient because: hip metastatic disease, uncontrolled pain    Consultants:  Dr Ferd Hibbs   Procedures:    Antimicrobials:    Subjective: She is alert, report shoulder pain, politely decline lidocaine patch. She doesn't like to take meds. She is worry about getting addicted to pain meds. We discussed about it  Objective: Vitals:   09/29/23 1840 09/29/23 1953 09/30/23 0201 09/30/23 0539  BP: 121/61 (!) 109/50 (!) 105/50 (!) 99/50  Pulse: 72 70 73 62  Resp: 16 18 18 18   Temp: 98 F (36.7 C) (!) 97.5 F (36.4 C) 97.7 F (36.5 C) 97.7 F (36.5 C)  TempSrc:  Oral Oral Oral  SpO2: 98% 97% 98% 97%  Weight:      Height:        Intake/Output Summary (Last 24 hours) at 09/30/2023 0737 Last data filed at 09/30/2023 0865 Gross per 24 hour  Intake 240 ml  Output 0 ml  Net 240 ml   Filed Weights   09/29/23 1155  Weight: 55.3 kg    Examination:  General exam: Appears calm and comfortable  Respiratory system: Clear to auscultation. Respiratory effort normal. Cardiovascular system: S1 & S2 heard, RRR. No JVD, murmurs, rubs, gallops or clicks. No pedal edema. Gastrointestinal system: Abdomen is nondistended, soft and nontender. No organomegaly or masses felt. Normal bowel sounds heard. Central nervous system: Alert and oriented. Extremities: Symmetric 5 x 5 power.   Data Reviewed: I have personally reviewed following labs and imaging studies  CBC: Recent  Labs  Lab 09/29/23 1254 09/30/23 0355  WBC 4.0 2.7*  NEUTROABS 2.4  --   HGB 10.4* 10.3*  HCT 30.2* 31.2*  MCV 98.4 100.6*  PLT 214 214   Basic Metabolic Panel: Recent Labs  Lab 09/29/23 1254 09/30/23 0355  NA 137 138  K 3.6 3.7  CL 101 106  CO2 28 25  GLUCOSE 93 85  BUN 15 17  CREATININE 0.96 0.87  CALCIUM 8.9 8.7*   GFR: Estimated Creatinine Clearance: 57.6 mL/min (by C-G formula  based on SCr of 0.87 mg/dL). Liver Function Tests: Recent Labs  Lab 09/30/23 0355  AST 17  ALT 19  ALKPHOS 44  BILITOT 0.6  PROT 6.5  ALBUMIN 3.5   No results for input(s): "LIPASE", "AMYLASE" in the last 168 hours. No results for input(s): "AMMONIA" in the last 168 hours. Coagulation Profile: No results for input(s): "INR", "PROTIME" in the last 168 hours. Cardiac Enzymes: No results for input(s): "CKTOTAL", "CKMB", "CKMBINDEX", "TROPONINI" in the last 168 hours. BNP (last 3 results) No results for input(s): "PROBNP" in the last 8760 hours. HbA1C: No results for input(s): "HGBA1C" in the last 72 hours. CBG: No results for input(s): "GLUCAP" in the last 168 hours. Lipid Profile: No results for input(s): "CHOL", "HDL", "LDLCALC", "TRIG", "CHOLHDL", "LDLDIRECT" in the last 72 hours. Thyroid Function Tests: No results for input(s): "TSH", "T4TOTAL", "FREET4", "T3FREE", "THYROIDAB" in the last 72 hours. Anemia Panel: No results for input(s): "VITAMINB12", "FOLATE", "FERRITIN", "TIBC", "IRON", "RETICCTPCT" in the last 72 hours. Sepsis Labs: No results for input(s): "PROCALCITON", "LATICACIDVEN" in the last 168 hours.  No results found for this or any previous visit (from the past 240 hour(s)).       Radiology Studies: CT FEMUR RIGHT WO CONTRAST  Result Date: 09/29/2023 CLINICAL DATA:  Right hip pain, history of breast cancer, metastatic lesion in right femoral head on earlier CT right hip EXAM: CT OF THE LOWER RIGHT EXTREMITY WITHOUT CONTRAST TECHNIQUE: Multidetector CT imaging of the right lower extremity was performed according to the standard protocol. RADIATION DOSE REDUCTION: This exam was performed according to the departmental dose-optimization program which includes automated exposure control, adjustment of the mA and/or kV according to patient size and/or use of iterative reconstruction technique. COMPARISON:  09/29/2023, 06/19/2023 FINDINGS: Bones/Joint/Cartilage  Rounded lytic lesion within the superior anterior aspect of the right femoral head is unchanged from CT right hip performed earlier today. The smaller rounded lucencies at the junction of the right head and neck are also stable. Please refer to that exam for full description of findings. The remainder of the right femur is unremarkable, with no evidence of an acute or destructive process. Likely small bone island within the inter trochanteric region of the proximal right femur reference image 50/9, with no associated metabolic activity on recent PET scan. Mild osteoarthritis of the right hip and knee. No evidence of right hip or knee effusion. Ligaments Suboptimally assessed by CT. Muscles and Tendons No acute abnormality. Soft tissues No evidence of subcutaneous edema or fluid collection. No soft tissue masses identified on this unenhanced exam. Stable uterine fibroids partially visualized. Reconstructed images demonstrate no additional findings. IMPRESSION: 1. Stable lucent lesions within the right femoral head and neck region as above. The largest within the superior aspect of the right femoral head consistent with metastatic disease given recent increased radiotracer uptake on PET scan. 2. No other acute or destructive lesions within the remainder of the right femur. 3. Mild right hip and knee osteoarthritis.  Electronically Signed   By: Sharlet Salina M.D.   On: 09/29/2023 16:21   CT Hip Right Wo Contrast  Result Date: 09/29/2023 CLINICAL DATA:  Right hip pain.  History of breast cancer EXAM: CT OF THE RIGHT HIP WITHOUT CONTRAST TECHNIQUE: Multidetector CT imaging of the right hip was performed according to the standard protocol. Multiplanar CT image reconstructions were also generated. RADIATION DOSE REDUCTION: This exam was performed according to the departmental dose-optimization program which includes automated exposure control, adjustment of the mA and/or kV according to patient size and/or use of  iterative reconstruction technique. COMPARISON:  X-ray 09/29/2023.  PET-CT 06/19/2023 FINDINGS: Bones/Joint/Cartilage Rounded lytic lesion in the anterosuperior aspect of the right femoral head measuring 14 mm in diameter. There is cortical breakthrough superiorly. This lesion has partially sclerotic margins. Additionally there is a 6 mm circumscribed rounded lucent lesion anteriorly at the femoral head-neck junction with well-defined sclerotic margins. No additional lytic or sclerotic bone lesions. No fracture or dislocation. Mild right hip joint space narrowing. No hip joint effusion is evident. Ligaments Suboptimally assessed by CT. Muscles and Tendons No acute musculotendinous abnormality by CT. Soft tissues No soft tissue swelling or fluid collections. Fibroid uterus. No inguinal lymphadenopathy. IMPRESSION: 1. Rounded lytic lesion within the right femoral head measuring 14 mm with cortical breakthrough superiorly. This corresponds to hypermetabolic lesion seen on the previous PET-CT and is compatible with metastatic disease in the setting of known breast cancer. 2. Additional 6 mm circumscribed rounded lucent lesion anteriorly at the femoral head-neck junction. This is favored to represent a benign synovial herniation pit although an additional tiny metastatic lesion is not excluded. 3. Mild right hip osteoarthritis.  No fracture or dislocation. 4. Fibroid uterus. Electronically Signed   By: Duanne Guess D.O.   On: 09/29/2023 14:28   DG Hip Unilat W or Wo Pelvis 2-3 Views Right  Result Date: 09/29/2023 CLINICAL DATA:  Right hip pain beginning yesterday. No known injury. EXAM: DG HIP (WITH OR WITHOUT PELVIS) 2-3V RIGHT COMPARISON:  None Available. FINDINGS: There is no evidence of hip fracture or dislocation. There is no evidence of arthropathy or other significant abnormality IMPRESSION: Negative. Electronically Signed   By: Danae Orleans M.D.   On: 09/29/2023 12:45        Scheduled  Meds: Continuous Infusions:   LOS: 0 days    Time spent: 35 Minutes.     Alba Cory, MD [facility]   If 7PM-7AM, please contact night-coverage [email or web address]  09/30/2023, 7:37 AM

## 2023-09-30 NOTE — Discharge Instructions (Signed)

## 2023-09-30 NOTE — Progress Notes (Signed)
Prior-To-Admission Oral Chemotherapy for Treatment of Oncologic Disease   Order noted from Dr. Sunnie Nielsen to continue prior-to-admission oral chemotherapy regimen of V?rzeni?.  Procedure Per Pharmacy & Therapeutics Committee Policy: Orders for continuation of home oral chemotherapy for treatment of an oncologic disease will be held unless approved by an oncologist during current admission.    For patients receiving oncology care at [facility], inpatient pharmacist contacts patient's oncologist during regular office hours to review. If earlier review is medically necessary, attending physician consults [facility] on-call oncologist   Oral chemotherapy continuation order is on hold pending oncologist review, [facility] oncologist Dr Mosetta Putt will be notified by inpatient pharmacy during office hours     Lynann Beaver PharmD, BCPS [facility] main pharmacy [phone] 09/30/2023 10:10 AM

## 2023-09-30 NOTE — H&P (View-Only) (Signed)
Reason for Consult: Right hip pain in setting of metastatic breast cancer Referring Physician: Sunnie Nielsen, MD (Hospitalist)  Kristina Nicholson is an 57 y.o. female.  HPI: 57 year old female with known history of breast cancer including recent diagnosis of metastatic lesion to her left proximal humerus as well as diagnosis of a right femoral head lesion by PET scan.  She presented to the hospital with increasing pain involving her right hip.  Radiographic evaluation of her right hip including plain films and CT scan revealed concerns for a lytic lesion involving the superior aspect of her femoral head with penetration of the femoral head cortex. She does have complaints of left shoulder pain.  Past Medical History:  Diagnosis Date   Chronic left shoulder pain    Headache    History of external beam radiation therapy    07-31-2017  to  09-10-2017  LEft chest wass 50 gy in 25 fractions, Left Supraclavicular 45 Gy in 25 fractions, Left Chest wall scar boost 10 Gy in 5 fractions.   Horseshoe kidney    Malignant neoplasm of upper-inner quadrant of left breast in female, estrogen receptor positive (HCC) 04/18/2017   oncologist--- dr Mosetta Putt;   dx 06/ 2018 ,  Stage IIB, ER/PR+, G3, IDC/ DCIS;  06-13-2017 s/p left mastectomy w/ node dissection ;  completed radiation 09-10-2017;   recurrence w/ mets to bone 07/ 2024 , started chemotherapy   Metastatic cancer to bone (HCC) 05/2023   Uterine fibroid     Past Surgical History:  Procedure Laterality Date   APPENDECTOMY  2005   COLONOSCOPY  2016   GANGLION CYST EXCISION Right 09/24/2017   Procedure: EXCISION OF RIGHT DORSAL WRIST GANGLION;  Surgeon: Mack Hook, MD;  Location: [facility];  Service: Orthopedics;  Laterality: Right;   LAPAROSCOPIC SALPINGO OOPHERECTOMY Bilateral 08/24/2023   Procedure: LAPAROSCOPIC SALPINGO OOPHORECTOMY;  Surgeon: [name], DO;  Location: [facility];  Service: Gynecology;   Laterality: Bilateral;   LIPOMA EXCISION Left 06/13/2017   Procedure: EXCISION 3CM LIPOMA ON LEFT ARM;  Surgeon: Griselda Miner, MD;  Location: [facility] OR;  Service: General;  Laterality: Left;   MASTECTOMY W/ SENTINEL NODE BIOPSY Left 06/13/2017   Procedure: MASTECTOMY WITH SENTINEL LYMPH NODE BIOPSY;  Surgeon: Griselda Miner, MD;  Location: [facility] OR;  Service: General;  Laterality: Left;    Family History  Problem Relation Age of Onset   Colon cancer Brother     Social History:  reports that she has been smoking cigarettes. She started smoking about 38 years ago. She has a 8 pack-year smoking history. She has never used smokeless tobacco. She reports that she does not drink alcohol and does not use drugs.  Allergies: No Known Allergies  Medications: I have reviewed the patient's current medications. Scheduled:  pantoprazole  40 mg Oral Daily    Results for orders placed or performed during the hospital encounter of 09/29/23 (from the past 24 hour(s))  CBC with Differential/Platelet     Status: Abnormal   Collection Time: 09/29/23 12:54 PM  Result Value Ref Range   WBC 4.0 4.0 - 10.5 K/uL   RBC 3.07 (L) 3.87 - 5.11 MIL/uL   Hemoglobin 10.4 (L) 12.0 - 15.0 g/dL   HCT 16.1 (L) 09.6 - 04.5 %   MCV 98.4 80.0 - 100.0 fL   MCH 33.9 26.0 - 34.0 pg   MCHC 34.4 30.0 - 36.0 g/dL   RDW 40.9 (H) 81.1 - 91.4 %   Platelets  214 150 - 400 K/uL   nRBC 0.0 0.0 - 0.2 %   Neutrophils Relative % 60 %   Neutro Abs 2.4 1.7 - 7.7 K/uL   Lymphocytes Relative 29 %   Lymphs Abs 1.2 0.7 - 4.0 K/uL   Monocytes Relative 8 %   Monocytes Absolute 0.3 0.1 - 1.0 K/uL   Eosinophils Relative 2 %   Eosinophils Absolute 0.1 0.0 - 0.5 K/uL   Basophils Relative 1 %   Basophils Absolute 0.0 0.0 - 0.1 K/uL   Immature Granulocytes 0 %   Abs Immature Granulocytes 0.01 0.00 - 0.07 K/uL  Basic metabolic panel     Status: None   Collection Time: 09/29/23 12:54 PM  Result Value Ref Range   Sodium 137 135 - 145 mmol/L    Potassium 3.6 3.5 - 5.1 mmol/L   Chloride 101 98 - 111 mmol/L   CO2 28 22 - 32 mmol/L   Glucose, Bld 93 70 - 99 mg/dL   BUN 15 6 - 20 mg/dL   Creatinine, Ser 1.61 0.44 - 1.00 mg/dL   Calcium 8.9 8.9 - 09.6 mg/dL   GFR, Estimated >04 >54 mL/min   Anion gap 8 5 - 15  CBC     Status: Abnormal   Collection Time: 09/30/23  3:55 AM  Result Value Ref Range   WBC 2.7 (L) 4.0 - 10.5 K/uL   RBC 3.10 (L) 3.87 - 5.11 MIL/uL   Hemoglobin 10.3 (L) 12.0 - 15.0 g/dL   HCT 09.8 (L) 11.9 - 14.7 %   MCV 100.6 (H) 80.0 - 100.0 fL   MCH 33.2 26.0 - 34.0 pg   MCHC 33.0 30.0 - 36.0 g/dL   RDW 82.9 (H) 56.2 - 13.0 %   Platelets 214 150 - 400 K/uL   nRBC 0.0 0.0 - 0.2 %  Comprehensive metabolic panel     Status: Abnormal   Collection Time: 09/30/23  3:55 AM  Result Value Ref Range   Sodium 138 135 - 145 mmol/L   Potassium 3.7 3.5 - 5.1 mmol/L   Chloride 106 98 - 111 mmol/L   CO2 25 22 - 32 mmol/L   Glucose, Bld 85 70 - 99 mg/dL   BUN 17 6 - 20 mg/dL   Creatinine, Ser 8.65 0.44 - 1.00 mg/dL   Calcium 8.7 (L) 8.9 - 10.3 mg/dL   Total Protein 6.5 6.5 - 8.1 g/dL   Albumin 3.5 3.5 - 5.0 g/dL   AST 17 15 - 41 U/L   ALT 19 0 - 44 U/L   Alkaline Phosphatase 44 38 - 126 U/L   Total Bilirubin 0.6 <1.2 mg/dL   GFR, Estimated >78 >46 mL/min   Anion gap 7 5 - 15     X-ray: CLINICAL DATA:  Right hip pain beginning yesterday. No known injury.   EXAM: DG HIP (WITH OR WITHOUT PELVIS) 2-3V RIGHT   COMPARISON:  None Available.   FINDINGS: There is no evidence of hip fracture or dislocation. There is no evidence of arthropathy or other significant abnormality   IMPRESSION: Negative.     Electronically Signed   By: Danae Orleans M.D.  CLINICAL DATA:  Right hip pain.  History of breast cancer   EXAM: CT OF THE RIGHT HIP WITHOUT CONTRAST   TECHNIQUE: Multidetector CT imaging of the right hip was performed according to the standard protocol. Multiplanar CT image reconstructions were also  generated.   RADIATION DOSE REDUCTION: This exam was performed according to  the departmental dose-optimization program which includes automated exposure control, adjustment of the mA and/or kV according to patient size and/or use of iterative reconstruction technique.   COMPARISON:  X-ray 09/29/2023.  PET-CT 06/19/2023   FINDINGS: Bones/Joint/Cartilage   Rounded lytic lesion in the anterosuperior aspect of the right femoral head measuring 14 mm in diameter. There is cortical breakthrough superiorly. This lesion has partially sclerotic margins. Additionally there is a 6 mm circumscribed rounded lucent lesion anteriorly at the femoral head-neck junction with well-defined sclerotic margins. No additional lytic or sclerotic bone lesions. No fracture or dislocation. Mild right hip joint space narrowing. No hip joint effusion is evident.   Ligaments   Suboptimally assessed by CT.   Muscles and Tendons   No acute musculotendinous abnormality by CT.   Soft tissues   No soft tissue swelling or fluid collections. Fibroid uterus. No inguinal lymphadenopathy.   IMPRESSION: 1. Rounded lytic lesion within the right femoral head measuring 14 mm with cortical breakthrough superiorly. This corresponds to hypermetabolic lesion seen on the previous PET-CT and is compatible with metastatic disease in the setting of known breast cancer. 2. Additional 6 mm circumscribed rounded lucent lesion anteriorly at the femoral head-neck junction. This is favored to represent a benign synovial herniation pit although an additional tiny metastatic lesion is not excluded. 3. Mild right hip osteoarthritis.  No fracture or dislocation. 4. Fibroid uterus.     Electronically Signed   By: Duanne Guess D.O.  CLINICAL DATA:  Subsequent treatment strategy for invasive breast cancer diagnosed in 2018 with mastectomy and radiation therapy. Left shoulder pain.   EXAM: NUCLEAR MEDICINE PET SKULL BASE TO  THIGH   TECHNIQUE: 6.23 mCi F-18 FDG was injected intravenously. Full-ring PET imaging was performed from the skull base to thigh after the radiotracer. CT data was obtained and used for attenuation correction and anatomic localization.   Fasting blood glucose: 90 mg/dl   COMPARISON:  None recent. Abdominopelvic CT 07/16/2014. Pelvic MRI 07/31/2014. Unilateral right mammography 10/02/2022.   FINDINGS: Mediastinal blood pool activity: SUV max 2.0   NECK:   No hypermetabolic cervical lymph nodes are identified.Fairly symmetric activity within the lymphoid tissue of Waldeyer's ring is within physiologic limits. No suspicious activity identified within the pharyngeal mucosal space.   Incidental CT findings: none   CHEST:   There are no hypermetabolic mediastinal, hilar, axillary or internal mammary lymph nodes. No hypermetabolic pulmonary activity or suspicious nodularity.   Incidental CT findings: Status post left mastectomy and axillary node dissection. Mild coronary artery atherosclerosis. Mild subpleural fibrosis anteriorly in the left upper lobe attributed to prior radiation therapy.   ABDOMEN/PELVIS:   There is no hypermetabolic activity within the liver, adrenal glands, spleen or pancreas. There is no hypermetabolic nodal activity in the abdomen or pelvis.   Incidental CT findings: There are scattered hypodense lesions within the liver which have enlarged compared with the remote CT, although demonstrate no hypermetabolic activity and are probable cysts. Underlying hepatic steatosis. Horseshoe kidney noted. Mild iliac atherosclerosis. Uterine fibroids, similar to previous MRI.   SKELETON:   There are multiple hypermetabolic osseous lesions consistent with metastatic disease. There are prominent lesions in the left humeral head (SUV max 10.9) and the left scapular body (SUV max 8.7). There are also prominent lesions within the C2 vertebral body (SUV max 5.7),  the T12 vertebral body and the right femoral head. No chest wall or other soft tissue recurrence identified.   Incidental CT findings: No definite pathologic fracture or  epidural tumor.   IMPRESSION: 1. Multifocal hypermetabolic osseous metastatic disease as described. No definite pathologic fracture or epidural tumor identified. There are lesions within the left humeral head and left scapula which may contribute to the patient's left shoulder pain. 2. No other evidence of metastatic disease. 3. Probable hepatic cysts. 4. Horseshoe kidney and uterine fibroids.     Electronically Signed   By: Carey Bullocks M.D.  ROS: As per HPI  Blood pressure (!) 99/50, pulse 62, temperature 97.7 F (36.5 C), temperature source Oral, resp. rate 18, height 5\' 3"  (1.6 m), weight 55.3 kg, last menstrual period 06/22/2015, SpO2 97%.  Physical Exam: Constitutional:      General: She is awake. She is not in acute distress.    Appearance: Normal appearance. She is normal weight. She is ill-appearing.  HENT:     Head: Normocephalic.     Nose: No rhinorrhea.     Mouth/Throat:     Mouth: Mucous membranes are moist.  Eyes:     General: No scleral icterus.    Pupils: Pupils are equal, round, and reactive to light.  Neck:     Vascular: No JVD.  Cardiovascular:     Rate and Rhythm: Normal rate and regular rhythm.     Heart sounds: S1 normal and S2 normal.  Pulmonary:     Effort: Pulmonary effort is normal.     Breath sounds: Normal breath sounds. No wheezing, rhonchi or rales.  Abdominal:     General: Bowel sounds are normal. There is no distension.     Palpations: Abdomen is soft.     Tenderness: There is no abdominal tenderness.  Musculoskeletal:     Cervical back: Neck supple.     Right hip: Painful range of motion to the right hip.  Strength limited due to pain    Right lower leg: No edema.     Left lower leg: No edema.  I deferred examination of her left proximal humerus. Skin:     General: Skin is warm and dry.  Neurological:     General: No focal deficit present.     Mental Status: She is alert and oriented to person, place, and time.  Psychiatric:        Mood and Affect: Mood normal.        Behavior: Behavior normal. Behavior is cooperative.   Assessment/Plan: 1.  Likely right femoral head metastatic breast cancer lesion with penetration of superior cortex of the femoral head correlated with pain with activity  Plan: Today we had a lengthy discussion regarding the findings on her radiographic studies of her right hip and femur.  She does have this lytic lesion in the superior aspect of the femoral head with penetration of the femoral head cortex.  Unfortunately I do not feel that there would be any nonsurgical intervention that would provide benefit given the location of this lesion and the current penetration of the femoral head cortex.  I am recommending that she proceed with total hip replacement to remove the femoral head including this lesion.  We reviewed risks of infection DVT dislocation neurovascular injury and the need for future surgeries.  Her husband's father had a history of a right total hip replacement from the 1980s that was complicated by infection.  We discussed this at length to try to alleviate some of their concerns. From a management standpoint of her right femoral head lesion unfortunately the only option available would be to remove this femoral head and performed  total hip replacement.  We reviewed the postoperative course and expectations.  Further medical management of her metastatic disease would be addressed through her primary team. Postoperatively she would likely need to be placed on Xarelto as DVT prophylaxis for at least 2 weeks based on her history of cancer as well as her total hip replacement. We will make her n.p.o. with plans to try to her get to the operating room today.  Shelda Pal 09/30/2023, 7:58 AM

## 2023-09-30 NOTE — Anesthesia Preprocedure Evaluation (Addendum)
Anesthesia Evaluation  Patient identified by MRN, date of birth, ID band Patient awake    Reviewed: Allergy & Precautions, NPO status , Patient's Chart, lab work & pertinent test results  History of Anesthesia Complications Negative for: history of anesthetic complications  Airway Mallampati: II  TM Distance: >3 FB Neck ROM: Full    Dental  (+) Dental Advisory Given   Pulmonary Current Smoker and Patient abstained from smoking.   breath sounds clear to auscultation       Cardiovascular  Rhythm:Regular Rate:Normal     Neuro/Psych  Headaches    GI/Hepatic   Endo/Other  negative endocrine ROS    Renal/GU Horseshoe kidney: normal function     Musculoskeletal Lytic lesion hip   Abdominal   Peds  Hematology  (+) Blood dyscrasia (Hb 10.3, plt 241k), anemia   Anesthesia Other Findings H/o breast cancer: mets to bone  Reproductive/Obstetrics S/p oopherectomy                             Anesthesia Physical Anesthesia Plan  ASA: 3  Anesthesia Plan: Spinal   Post-op Pain Management: Tylenol PO (pre-op)*   Induction:   PONV Risk Score and Plan: 1 and Ondansetron  Airway Management Planned: Natural Airway and Simple Face Mask  Additional Equipment: None  Intra-op Plan:   Post-operative Plan:   Informed Consent: I have reviewed the patients History and Physical, chart, labs and discussed the procedure including the risks, benefits and alternatives for the proposed anesthesia with the patient or authorized representative who has indicated his/her understanding and acceptance.     Dental advisory given  Plan Discussed with: CRNA and Surgeon  Anesthesia Plan Comments:         Anesthesia Quick Evaluation

## 2023-09-30 NOTE — Anesthesia Procedure Notes (Signed)
Procedure Name: MAC Date/Time: 09/30/2023 12:59 PM  Performed by: Nelle Don, CRNAPre-anesthesia Checklist: Patient identified, Emergency Drugs available, Suction available and Patient being monitored Oxygen Delivery Method: Simple face mask

## 2023-09-30 NOTE — Anesthesia Procedure Notes (Signed)
Spinal  Patient location during procedure: OR End time: 09/30/2023 1:02 PM Reason for block: surgical anesthesia Staffing Performed: anesthesiologist  Anesthesiologist: Jairo Ben, MD Performed by: Jairo Ben, MD Authorized by: Jairo Ben, MD   Preanesthetic Checklist Completed: patient identified, IV checked, site marked, risks and benefits discussed, surgical consent, monitors and equipment checked, pre-op evaluation and timeout performed Spinal Block Patient position: sitting Prep: DuraPrep Patient monitoring: heart rate, cardiac monitor, continuous pulse ox and blood pressure Approach: midline Location: L3-4 Injection technique: single-shot Needle Needle type: Pencan and Introducer  Needle gauge: 24 G Needle length: 9 cm Assessment Sensory level: T4 Events: CSF return Additional Notes Pt identified in Operating room.  Monitors applied. Working IV access confirmed. Timeout, Sterile prep, drape lumbar spine.  1% lido local L 3,4.  #24ga Pencan into clear CSF L 3,4.  12mg  0.75% Bupivacaine with dextrose injected with asp CSF beginning and end of injection.  Patient asymptomatic, VSS, no heme aspirated, tolerated well.  Sandford Craze, MD

## 2023-10-01 ENCOUNTER — Encounter (HOSPITAL_COMMUNITY): Payer: Self-pay | Admitting: Orthopedic Surgery

## 2023-10-01 ENCOUNTER — Telehealth: Payer: Self-pay

## 2023-10-01 DIAGNOSIS — R52 Pain, unspecified: Secondary | ICD-10-CM | POA: Diagnosis not present

## 2023-10-01 LAB — BASIC METABOLIC PANEL
Anion gap: 8 (ref 5–15)
BUN: 15 mg/dL (ref 6–20)
CO2: 23 mmol/L (ref 22–32)
Calcium: 8.5 mg/dL — ABNORMAL LOW (ref 8.9–10.3)
Chloride: 106 mmol/L (ref 98–111)
Creatinine, Ser: 0.87 mg/dL (ref 0.44–1.00)
GFR, Estimated: >60 mL/min (ref >60)
Glucose, Bld: 156 mg/dL — ABNORMAL HIGH (ref 70–99)
Potassium: 3.7 mmol/L (ref 3.5–5.1)
Sodium: 137 mmol/L (ref 135–145)

## 2023-10-01 LAB — CBC
HCT: 27.3 % — ABNORMAL LOW (ref 36.0–46.0)
Hemoglobin: 9.3 g/dL — ABNORMAL LOW (ref 12.0–15.0)
MCH: 33.5 pg (ref 26.0–34.0)
MCHC: 34.1 g/dL (ref 30.0–36.0)
MCV: 98.2 fL (ref 80.0–100.0)
Platelets: 180 10*3/uL (ref 150–400)
RBC: 2.78 MIL/uL — ABNORMAL LOW (ref 3.87–5.11)
RDW: 15.3 % (ref 11.5–15.5)
WBC: 6.2 10*3/uL (ref 4.0–10.5)
nRBC: 0 % (ref 0.0–0.2)

## 2023-10-01 MED ORDER — RIVAROXABAN 10 MG PO TABS
10.0000 mg | ORAL_TABLET | Freq: Every day | ORAL | Status: DC
  Administered 2023-10-02: 10 mg via ORAL
  Filled 2023-10-01: qty 1

## 2023-10-01 MED ORDER — OXYCODONE HCL 5 MG PO TABS: 5.0000 mg | ORAL_TABLET | Freq: Four times a day (QID) | ORAL | Status: DC | PRN

## 2023-10-01 MED ORDER — LETROZOLE 2.5 MG PO TABS
2.5000 mg | ORAL_TABLET | Freq: Every day | ORAL | Status: DC
  Administered 2023-10-02: 2.5 mg via ORAL
  Filled 2023-10-01: qty 1

## 2023-10-01 NOTE — Progress Notes (Signed)
   Subjective: 1 Day Post-Op Procedure(s) (LRB): TOTAL HIP ARTHROPLASTY ANTERIOR APPROACH (Right) Patient reports pain as mild.   Patient seen in rounds by Dr. Charlann Boxer. Patient is resting in bed on exam this morning. No acute events overnight. Foley catheter removed. Patient has not been up with PT yet.  We will start therapy today.   Objective: Vital signs in last 24 hours: Temp:  [97.3 F (36.3 C)-98.2 F (36.8 C)] 98.2 F (36.8 C) (11/18 0559) Pulse Rate:  [50-82] 68 (11/18 0251) Resp:  [10-20] 17 (11/18 0251) BP: (80-114)/(40-60) 98/60 (11/18 0559) SpO2:  [96 %-100 %] 99 % (11/18 0559)  Intake/Output from previous day:  Intake/Output Summary (Last 24 hours) at 10/01/2023 0812 Last data filed at 10/01/2023 0600 Gross per 24 hour  Intake 1300 ml  Output 1600 ml  Net -300 ml     Intake/Output this shift: No intake/output data recorded.  Labs: Recent Labs    09/29/23 1254 09/30/23 0355 10/01/23 0346  HGB 10.4* 10.3* 9.3*   Recent Labs    09/30/23 0355 10/01/23 0346  WBC 2.7* 6.2  RBC 3.10* 2.78*  HCT 31.2* 27.3*  PLT 214 180   Recent Labs    09/30/23 0355 10/01/23 0346  NA 138 137  K 3.7 3.7  CL 106 106  CO2 25 23  BUN 17 15  CREATININE 0.87 0.87  GLUCOSE 85 156*  CALCIUM 8.7* 8.5*   No results for input(s): "LABPT", "INR" in the last 72 hours.  Exam: General - Patient is Alert and Oriented Extremity - Neurologically intact Sensation intact distally Intact pulses distally Dorsiflexion/Plantar flexion intact Dressing - dressing C/D/I Motor Function - intact, moving foot and toes well on exam.   Past Medical History:  Diagnosis Date   Chronic left shoulder pain    Headache    History of external beam radiation therapy    07-31-2017  to  09-10-2017  LEft chest wass 50 gy in 25 fractions, Left Supraclavicular 45 Gy in 25 fractions, Left Chest wall scar boost 10 Gy in 5 fractions.   Horseshoe kidney    Malignant neoplasm of upper-inner quadrant  of left breast in female, estrogen receptor positive (HCC) 04/18/2017   oncologist--- dr Mosetta Putt;   dx 06/ 2018 ,  Stage IIB, ER/PR+, G3, IDC/ DCIS;  06-13-2017 s/p left mastectomy w/ node dissection ;  completed radiation 09-10-2017;   recurrence w/ mets to bone 07/ 2024 , started chemotherapy   Metastatic cancer to bone (HCC) 05/2023   Uterine fibroid     Assessment/Plan: 1 Day Post-Op Procedure(s) (LRB): TOTAL HIP ARTHROPLASTY ANTERIOR APPROACH (Right) Principal Problem:   Intractable pain Active Problems:   Malignant neoplasm of upper-inner quadrant of left breast in female, estrogen receptor positive (HCC)   Metastasis to bone (HCC)   Normocytic anemia   S/P total right hip arthroplasty  Estimated body mass index is 21.61 kg/m as calculated from the following:   Height as of this encounter: 5\' 3"  (1.6 m).   Weight as of this encounter: 55.3 kg. Advance diet  DVT Prophylaxis - Xarelto 10 mg daily for 3 weeks, then aspirin 81 mg BID for 3 weeks Weight bearing as tolerated.  Femoral head sent to pathology   Up with PT today   Rosalene Billings, PA-C Orthopedic Surgery [phone] 10/01/2023, 8:12 AM

## 2023-10-01 NOTE — Evaluation (Signed)
Physical Therapy Evaluation Patient Details Name: Kristina Nicholson MRN: [id number] DOB: 1965-12-03 Today's Date: 10/01/2023  History of Present Illness  57 y.o. female  who presented with severe right hip pain since yesterday evening   CT  right hip showed a rounded lytic lesion within the right femoral head measuring 14 mm with cortical breakthrough superiorly, corresponds to the hypermetabolic lesion seen on the previous PET scan.  Additional 6 mm Disprin chronic rounded lucent lesion anteriorly about the femoral head neck junction favored to be a benign synovial herniation although metastatic lesion is not totally excluded.  Mild right hip osteoarthritis.  per PET scan August prominent lesions in the left humeral  head, and the left scapular body.   Pt is now s/p R DA THA on 09/29/23.  PMH:  chronic left shoulder pain, left breast cancer with, left mastectomy, metastasis to bone, external beam radiation of left chest mass, left supraclavicular area, horseshoe kidney, fibroids  Clinical Impression  Pt admitted with above diagnosis.  Pt very motivated to work with PT. Amb ~ 75 with min to CGA. Pt reports husband is taking some time off work to assist as needed. Pt has flight of stairs to get to bedroom at home, will address in future sessions.   Pt currently with functional limitations due to the deficits listed below (see PT Problem List). Pt will benefit from acute skilled PT to increase their independence and safety with mobility to allow discharge.           If plan is discharge home, recommend the following: A little help with bathing/dressing/bathroom;Help with stairs or ramp for entrance;Assistance with cooking/housework   Can travel by private vehicle        Equipment Recommendations Rolling walker (2 wheels)  Recommendations for Other Services       Functional Status Assessment Patient has had a recent decline in their functional status and demonstrates the ability to make  significant improvements in function in a reasonable and predictable amount of time.     Precautions / Restrictions Precautions Precautions: Fall Restrictions Weight Bearing Restrictions: Yes LUE Weight Bearing: Weight bearing as tolerated RLE Weight Bearing: Weight bearing as tolerated Other Position/Activity Restrictions: lesions L humerus and scapula      Mobility  Bed Mobility Overal bed mobility: Needs Assistance Bed Mobility: Supine to Sit, Sit to Supine     Supine to sit: Contact guard Sit to supine: Min assist   General bed mobility comments: light assist to bring RLE on to bed    Transfers Overall transfer level: Needs assistance Equipment used: Rolling walker (2 wheels) Transfers: Sit to/from Stand Sit to Stand: Contact guard assist           General transfer comment: cues for hand placement    Ambulation/Gait Ambulation/Gait assistance: Contact guard assist Gait Distance (Feet): 75 Feet Assistive device: Rolling walker (2 wheels) Gait Pattern/deviations: Step-to pattern, Decreased stance time - right       General Gait Details: cues for sequence and RW position  Stairs            Wheelchair Mobility     Tilt Bed    Modified Rankin (Stroke Patients Only)       Balance                                             Pertinent Vitals/Pain  Home Living Family/patient expects to be discharged to:: Private residence Living Arrangements: Spouse/significant other Available Help at Discharge: Family;Available 24 hours/day Type of Home: Other(Comment) (townhouse) Home Access: Stairs to enter   Entrance Stairs-Number of Steps: 1 Alternate Level Stairs-Number of Steps: 12 Home Layout: Two level;1/2 bath on main level Home Equipment: None      Prior Function Prior Level of Function : Independent/Modified Independent                     Extremity/Trunk Assessment   Upper Extremity Assessment Upper  Extremity Assessment: LUE deficits/detail;RUE deficits/detail RUE Deficits / Details: grossly WFL LUE Deficits / Details: flexion to 90 degrees,limtied by pain LUE: Unable to fully assess due to pain    Lower Extremity Assessment Lower Extremity Assessment: RLE deficits/detail RLE Deficits / Details: ankle WFL, knee and hip grossly 2+/5, limited by pain       Communication   Communication Communication: No apparent difficulties  Cognition Arousal: Alert Behavior During Therapy: WFL for tasks assessed/performed Overall Cognitive Status: Within Functional Limits for tasks assessed                                          General Comments      Exercises     Assessment/Plan    PT Assessment Patient needs continued PT services  PT Problem List Decreased strength;Decreased activity tolerance;Decreased balance;Decreased mobility;Decreased knowledge of precautions;Decreased knowledge of use of DME;Decreased range of motion;Pain       PT Treatment Interventions DME instruction;Gait training;Functional mobility training;Therapeutic activities;Therapeutic exercise;Patient/family education;Stair training    PT Goals (Current goals can be found in the Care Plan section)  Acute Rehab PT Goals Patient Stated Goal: home soon. less pain PT Goal Formulation: With patient Time For Goal Achievement: 10/15/23 Potential to Achieve Goals: Good    Frequency 7X/week     Co-evaluation               AM-PAC PT "6 Clicks" Mobility  Outcome Measure Help needed turning from your back to your side while in a flat bed without using bedrails?: A Little Help needed moving from lying on your back to sitting on the side of a flat bed without using bedrails?: A Little Help needed moving to and from a bed to a chair (including a wheelchair)?: A Little Help needed standing up from a chair using your arms (e.g., wheelchair or bedside chair)?: A Little Help needed to walk in  hospital room?: A Little Help needed climbing 3-5 steps with a railing? : A Lot 6 Click Score: 17    End of Session Equipment Utilized During Treatment: Gait belt Activity Tolerance: Patient tolerated treatment well Patient left: in bed;with call bell/phone within reach;with bed alarm set;with family/visitor present   PT Visit Diagnosis: Other abnormalities of gait and mobility (R26.89);Difficulty in walking, not elsewhere classified (R26.2)    Time: 5956-3875 PT Time Calculation (min) (ACUTE ONLY): 25 min   Charges:   PT Evaluation $PT Eval Low Complexity: 1 Low PT Treatments $Gait Training: 8-22 mins PT General Charges $$ ACUTE PT VISIT: 1 Visit         Liticia Gasior, PT  Acute Rehab Dept [facility]) [phone]  10/01/2023   [facility] 10/01/2023, 2:19 PM

## 2023-10-01 NOTE — TOC Initial Note (Signed)
Transition of Care [facility]) - Initial/Assessment Note   Patient Details  Name: Kristina Nicholson MRN: [id number] Date of Birth: [birth year]  [facility]) CM/SW Contact:    Ewing Schlein, LCSW Phone Number: 10/01/2023, 3:41 PM  Clinical Narrative: PT evaluation recommended HHPT and a rolling walker. CSW spoke with patient's spouse, Jaleen Schlader, regarding recommendations. Patient and spouse are agreeable to having [employer] deliver a rolling walker, but patient and spouse would like to discuss PT options as patient is not sure if she wants any PT follow up after discharge. CSW made DME referral to Zack with [employer]. [employer] to deliver rolling walker to patient's room after hospitalist places DME order.   Expected Discharge Plan: Home/Self Care Barriers to Discharge: Continued Medical Work up  Patient Goals and CMS Choice Patient states their goals for this hospitalization and ongoing recovery are:: Return home CMS Medicare.gov Compare Post Acute Care list provided to:: Patient Choice offered to / list presented to : Patient, Spouse  Expected Discharge Plan and Services In-house Referral: Clinical Social Work Post Acute Care Choice: Durable Medical Equipment Living arrangements for the past 2 months: Single Family Home          DME Arranged: Walker rolling DME Agency: [employer] Date DME Agency Contacted: 10/01/23 Time DME Agency Contacted: [phone] Representative spoke with at DME Agency: Zack  Prior Living Arrangements/Services Living arrangements for the past 2 months: Single Family Home Lives with:: Spouse Patient language and need for interpreter reviewed:: Yes Do you feel safe going back to the place where you live?: Yes      Need for Family Participation in Patient Care: Yes (Comment) Care giver support system in place?: Yes (comment) Criminal Activity/Legal Involvement Pertinent to Current Situation/Hospitalization: No - Comment as needed  Activities of Daily  Living ADL Screening (condition at time of admission) Independently performs ADLs?: Yes (appropriate for developmental age) Is the patient deaf or have difficulty hearing?: No Does the patient have difficulty seeing, even when wearing glasses/contacts?: No Does the patient have difficulty concentrating, remembering, or making decisions?: No  Permission Sought/Granted Permission sought to share information with : Other (comment) Permission granted to share information with : Yes, Verbal Permission Granted Permission granted to share info w AGENCY: [employer]  Emotional Assessment Orientation: : Oriented to Self, Oriented to Place, Oriented to  Time, Oriented to Situation Alcohol / Substance Use: Not Applicable Psych Involvement: No (comment)  Admission diagnosis:  Right hip pain [M25.551] Intractable pain [R52] Patient Active Problem List   Diagnosis Date Noted   S/P total right hip arthroplasty 09/30/2023   Intractable pain 09/29/2023   Normocytic anemia 09/29/2023   Metastasis to bone (HCC) 06/24/2023   Osteopenia 10/29/2022   Malignant neoplasm of upper-inner quadrant of left breast in female, estrogen receptor positive (HCC) 04/18/2017   PCP:  Patient, No Pcp Per Pharmacy:   [id number] [place], [place] - [address] AT [facility] OF [facility] [address] [place] [address] Phone: [phone] Fax: [phone]  Social Determinants of Health (SDOH) Social History: SDOH Screenings   Food Insecurity: No Food Insecurity (09/29/2023)  Housing: Low Risk  (09/29/2023)  Transportation Needs: No Transportation Needs (09/29/2023)  Utilities: Not At Risk (09/29/2023)  Depression (PHQ2-9): Low Risk  (09/07/2023)  Tobacco Use: High Risk (09/29/2023)   SDOH Interventions:    Readmission Risk Interventions     No data to display

## 2023-10-01 NOTE — Progress Notes (Signed)
Kristina Nicholson   DOB:01/04/1966   XB#:[id number]   ZHY#:[id number]  Med/onc follow up note   Subjective: Patient is well-known to me, under my care for her metastatic breast cancer to bones.  Patient is to finish the palliative radiation to left shoulder/shoulder blade last Friday, and developed acute right hip pain due to her bone metastasis.  She underwent right hip replacement by orthopedic surgeon Dr. Charlann Boxer yesterday. She is doing well overall after surgery, able to ambulate with a walker.  Her husband was at the bedside when I saw her.   Objective:  Vitals:   10/01/23 1009 10/01/23 1315  BP: (!) 105/55 (!) 111/46  Pulse: 70 72  Resp: 16 15  Temp: 98 F (36.7 C) 98.1 F (36.7 C)  SpO2: 99% 97%    Body mass index is 21.61 kg/m.  Intake/Output Summary (Last 24 hours) at 10/01/2023 2029 Last data filed at 10/01/2023 1800 Gross per 24 hour  Intake 1040 ml  Output 700 ml  Net 340 ml     Sclerae unicteric  Oropharynx clear  No peripheral adenopathy  Lungs clear -- no rales or rhonchi  Heart regular rate and rhythm  Abdomen benign  MSK (+) COVID incision in the right anterior hip  Neuro nonfocal    CBG (last 3)  No results for input(s): "GLUCAP" in the last 72 hours.   Labs:    Urine Studies No results for input(s): "UHGB", "CRYS" in the last 72 hours.  Invalid input(s): "UACOL", "UAPR", "USPG", "UPH", "UTP", "UGL", "UKET", "UBIL", "UNIT", "UROB", "ULEU", "UEPI", "UWBC", "URBC", "UBAC", "CAST", "UCOM", "BILUA"  Basic Metabolic Panel: Recent Labs  Lab 09/29/23 1254 09/30/23 0355 10/01/23 0346  NA 137 138 137  K 3.6 3.7 3.7  CL 101 106 106  CO2 28 25 23   GLUCOSE 93 85 156*  BUN 15 17 15   CREATININE 0.96 0.87 0.87  CALCIUM 8.9 8.7* 8.5*   GFR Estimated Creatinine Clearance: 57.6 mL/min (by C-G formula based on SCr of 0.87 mg/dL). Liver Function Tests: Recent Labs  Lab 09/30/23 0355  AST 17  ALT 19  ALKPHOS 44  BILITOT 0.6  PROT 6.5  ALBUMIN 3.5    No results for input(s): "LIPASE", "AMYLASE" in the last 168 hours. No results for input(s): "AMMONIA" in the last 168 hours. Coagulation profile No results for input(s): "INR", "PROTIME" in the last 168 hours.  CBC: Recent Labs  Lab 09/29/23 1254 09/30/23 0355 10/01/23 0346  WBC 4.0 2.7* 6.2  NEUTROABS 2.4  --   --   HGB 10.4* 10.3* 9.3*  HCT 30.2* 31.2* 27.3*  MCV 98.4 100.6* 98.2  PLT 214 214 180   Cardiac Enzymes: No results for input(s): "CKTOTAL", "CKMB", "CKMBINDEX", "TROPONINI" in the last 168 hours. BNP: Invalid input(s): "POCBNP" CBG: No results for input(s): "GLUCAP" in the last 168 hours. D-Dimer No results for input(s): "DDIMER" in the last 72 hours. Hgb A1c No results for input(s): "HGBA1C" in the last 72 hours. Lipid Profile No results for input(s): "CHOL", "HDL", "LDLCALC", "TRIG", "CHOLHDL", "LDLDIRECT" in the last 72 hours. Thyroid function studies No results for input(s): "TSH", "T4TOTAL", "T3FREE", "THYROIDAB" in the last 72 hours.  Invalid input(s): "FREET3" Anemia work up No results for input(s): "VITAMINB12", "FOLATE", "FERRITIN", "TIBC", "IRON", "RETICCTPCT" in the last 72 hours. Microbiology No results found for this or any previous visit (from the past 240 hour(s)).    Studies:  DG HIP UNILAT WITH PELVIS 1V RIGHT  Result Date: 09/30/2023 CLINICAL DATA:  Elective surgery. EXAM: DG HIP (WITH OR WITHOUT PELVIS) 1V RIGHT COMPARISON:  None Available. FINDINGS: Five fluoroscopic spot views of the pelvis and right hip obtained in the operating room. Sequential images during hip arthroplasty. Fluoroscopy time 7 seconds. Dose 0.7 mGy. IMPRESSION: Intraoperative fluoroscopy during right hip arthroplasty. Electronically Signed   By: Narda Rutherford M.D.   On: 09/30/2023 16:11   DG Pelvis Portable  Result Date: 09/30/2023 CLINICAL DATA:  Postop in PACU. EXAM: PORTABLE PELVIS 1-2 VIEWS COMPARISON:  Preoperative imaging FINDINGS: Right hip  arthroplasty in expected alignment. No periprosthetic lucency or fracture. Recent postsurgical change includes air and edema in the soft tissues. IMPRESSION: Right hip arthroplasty without immediate postoperative complication. Electronically Signed   By: Narda Rutherford M.D.   On: 09/30/2023 16:10   DG C-Arm 1-60 Min-No Report  Result Date: 09/30/2023 Fluoroscopy was utilized by the requesting physician.  No radiographic interpretation.    Assessment: 57 y.o. female   Acute worsening of right hip pain, secondary to bone metastasis to femoral head with penetration to superior cortex from her breast cancer, status post a right total hip replacement Metastatic breast cancer to bone Mild anemia secondary to surgery, verzenio  and metastatic breast cancer to bone Left shoulder pain secondary to bone metastasis   Plan:  -I have reviewed her hospital course, labs and imagings -I agree with Dr. Charlann Boxer that she is at high risk for DVT due to her surgery and metastatic breast cancer, I do not see Xarelto is ordered, so I will go ahead order 10mg  daily (per orth) starting tomorrow  -will start her on letrozole tomorrow -will hold Verzenio for now until her incision heals, to reduce her risk of infection -will postpone her PET scan for 2 weeks, her husband will call radiology  -I will see her one week after discharge, plan to check [employer] to see if she is a candidate for other targeted therapy for her breast cancer    Kristina Mood, MD 10/01/2023

## 2023-10-01 NOTE — Radiation Completion Notes (Signed)
Patient Name: TYEISHA, PAUTSCH MRN: [id number] Date of Birth: 1966/06/10 Referring Physician: Malachy Mood, M.D. Date of Service: 2023-10-01 Radiation Oncologist: Lonie Peak, M.D. [facility]                             RADIATION ONCOLOGY END OF TREATMENT NOTE     Diagnosis: C79.51 Secondary malignant neoplasm of bone Staging on 2017-06-13: Malignant neoplasm of upper-inner quadrant of left breast in female, estrogen receptor positive (HCC) T=pT2, N=pN1a, M=cM0 Staging on 2017-04-13: Malignant neoplasm of upper-inner quadrant of left breast in female, estrogen receptor positive (HCC) T=cT2, N=cN0, M=cM0 Intent: Palliative     ==========DELIVERED PLANS==========  First Treatment Date: 2023-09-17 - Last Treatment Date: 2023-09-28   Plan Name: Ext_L_Hum Site: Humerus, Left Technique: 3D Mode: Photon Dose Per Fraction: 3 Gy Prescribed Dose (Delivered / Prescribed): 30 Gy / 30 Gy Prescribed Fxs (Delivered / Prescribed): 10 / 10     ==========ON TREATMENT VISIT DATES========== 2023-09-17, 2023-09-24     ==========UPCOMING VISITS==========       ==========APPENDIX - ON TREATMENT VISIT NOTES==========   See weekly On Treatment Notes in Epic for details.

## 2023-10-01 NOTE — Progress Notes (Signed)
PROGRESS NOTE    Kristina Nicholson  ION:[id number] DOB: September 06, 1966 DOA: 09/29/2023 PCP: Patient, No Pcp Per   Brief Narrative: 57 year old with past medical history significant for chronic left shoulder pain, history of left breast cancer with positive estrogen receptors, status post left mastectomy, metastasis to bone, status post external beam radiation of the left chest mass, horseshoe kidney, who presents with severe right hip pain that is started the  evening prior to admission.  Right hip x-ray was negative for fracture.  CT of the right hip showed rounded lytic lesion within the right femoral head measuring 14 mm with cortical breakthrough superiorly which correspond to hypermetabolic lesion seen on previous PET scan.  Additional 6 mm circumscribed  rounded lucent lesions anteriorly at the femoral  neck junction favored to be benign synovial herniation although metastatic lesion is not totally excluded.     Assessment & Plan:   Principal Problem:   Intractable pain Active Problems:   Malignant neoplasm of upper-inner quadrant of left breast in female, estrogen receptor positive (HCC)   Metastasis to bone (HCC)   Normocytic anemia   S/P total right hip arthroplasty  1-Right Hip Metastatic Diseases:  Intractable Hip Pain:  -Orthopedic has been consulted.  -Underwent Right total Hip replacement anterior approach by Dr Charlann Boxer on 11/17 IV morphine PRN, Oxy PRN.  Bowel regimen. Miralax, Senna.  Follow up PT recommendation.  DVT prophylaxis per ortho   2-Malignant neoplasm of the (ER quadrant of the left breast estrogen positive with metastatic disease, Stage IV:  -Follow-up with Dr. Mosetta Putt -resume   Verzenio tomorrow if ok by Dr Mosetta Putt   Normocytic anemia: Leukopenia Monitor hb down to 9.   Estimated body mass index is 21.61 kg/m as calculated from the following:   Height as of this encounter: 5\' 3"  (1.6 m).   Weight as of this encounter: 55.3 kg.   DVT prophylaxis:  SCD Code Status: Full code Family Communication: Husband at bedside 11/17.  Disposition Plan:  Status is: Observation The patient will require care spanning > 2 midnights and should be moved to inpatient because: hip metastatic disease, uncontrolled pain    Consultants:  Dr Ferd Hibbs   Procedures:    Antimicrobials:    Subjective: She report shoulder pain is mild. Hip pain is better after Sx  Objective: Vitals:   10/01/23 0251 10/01/23 0559 10/01/23 1009 10/01/23 1315  BP: (!) 85/47 98/60 (!) 105/55 (!) 111/46  Pulse: 68  70 72  Resp: 17  16 15   Temp: 97.9 F (36.6 C) 98.2 F (36.8 C) 98 F (36.7 C) 98.1 F (36.7 C)  TempSrc:  Oral Axillary Oral  SpO2: 96% 99% 99% 97%  Weight:      Height:        Intake/Output Summary (Last 24 hours) at 10/01/2023 1346 Last data filed at 10/01/2023 0600 Gross per 24 hour  Intake 1100 ml  Output 1600 ml  Net -500 ml   Filed Weights   09/29/23 1155  Weight: 55.3 kg    Examination:  General exam: NAD Respiratory system: CTA Cardiovascular system: S 1, S 2 RRR Gastrointestinal system: BS present, soft, nt Central nervous system: Alert, follows command.  Extremities: clean dressing.   Data Reviewed: I have personally reviewed following labs and imaging studies  CBC: Recent Labs  Lab 09/29/23 1254 09/30/23 0355 10/01/23 0346  WBC 4.0 2.7* 6.2  NEUTROABS 2.4  --   --   HGB 10.4* 10.3* 9.3*  HCT 30.2* 31.2* 27.3*  MCV 98.4 100.6* 98.2  PLT 214 214 180   Basic Metabolic Panel: Recent Labs  Lab 09/29/23 1254 09/30/23 0355 10/01/23 0346  NA 137 138 137  K 3.6 3.7 3.7  CL 101 106 106  CO2 28 25 23   GLUCOSE 93 85 156*  BUN 15 17 15   CREATININE 0.96 0.87 0.87  CALCIUM 8.9 8.7* 8.5*   GFR: Estimated Creatinine Clearance: 57.6 mL/min (by C-G formula based on SCr of 0.87 mg/dL). Liver Function Tests: Recent Labs  Lab 09/30/23 0355  AST 17  ALT 19  ALKPHOS 44  BILITOT 0.6  PROT 6.5  ALBUMIN 3.5   No  results for input(s): "LIPASE", "AMYLASE" in the last 168 hours. No results for input(s): "AMMONIA" in the last 168 hours. Coagulation Profile: No results for input(s): "INR", "PROTIME" in the last 168 hours. Cardiac Enzymes: No results for input(s): "CKTOTAL", "CKMB", "CKMBINDEX", "TROPONINI" in the last 168 hours. BNP (last 3 results) No results for input(s): "PROBNP" in the last 8760 hours. HbA1C: No results for input(s): "HGBA1C" in the last 72 hours. CBG: No results for input(s): "GLUCAP" in the last 168 hours. Lipid Profile: No results for input(s): "CHOL", "HDL", "LDLCALC", "TRIG", "CHOLHDL", "LDLDIRECT" in the last 72 hours. Thyroid Function Tests: No results for input(s): "TSH", "T4TOTAL", "FREET4", "T3FREE", "THYROIDAB" in the last 72 hours. Anemia Panel: No results for input(s): "VITAMINB12", "FOLATE", "FERRITIN", "TIBC", "IRON", "RETICCTPCT" in the last 72 hours. Sepsis Labs: No results for input(s): "PROCALCITON", "LATICACIDVEN" in the last 168 hours.  No results found for this or any previous visit (from the past 240 hour(s)).       Radiology Studies: DG HIP UNILAT WITH PELVIS 1V RIGHT  Result Date: 09/30/2023 CLINICAL DATA:  Elective surgery. EXAM: DG HIP (WITH OR WITHOUT PELVIS) 1V RIGHT COMPARISON:  None Available. FINDINGS: Five fluoroscopic spot views of the pelvis and right hip obtained in the operating room. Sequential images during hip arthroplasty. Fluoroscopy time 7 seconds. Dose 0.7 mGy. IMPRESSION: Intraoperative fluoroscopy during right hip arthroplasty. Electronically Signed   By: Narda Rutherford M.D.   On: 09/30/2023 16:11   DG Pelvis Portable  Result Date: 09/30/2023 CLINICAL DATA:  Postop in PACU. EXAM: PORTABLE PELVIS 1-2 VIEWS COMPARISON:  Preoperative imaging FINDINGS: Right hip arthroplasty in expected alignment. No periprosthetic lucency or fracture. Recent postsurgical change includes air and edema in the soft tissues. IMPRESSION: Right hip  arthroplasty without immediate postoperative complication. Electronically Signed   By: Narda Rutherford M.D.   On: 09/30/2023 16:10   DG C-Arm 1-60 Min-No Report  Result Date: 09/30/2023 Fluoroscopy was utilized by the requesting physician.  No radiographic interpretation.   CT FEMUR RIGHT WO CONTRAST  Result Date: 09/29/2023 CLINICAL DATA:  Right hip pain, history of breast cancer, metastatic lesion in right femoral head on earlier CT right hip EXAM: CT OF THE LOWER RIGHT EXTREMITY WITHOUT CONTRAST TECHNIQUE: Multidetector CT imaging of the right lower extremity was performed according to the standard protocol. RADIATION DOSE REDUCTION: This exam was performed according to the departmental dose-optimization program which includes automated exposure control, adjustment of the mA and/or kV according to patient size and/or use of iterative reconstruction technique. COMPARISON:  09/29/2023, 06/19/2023 FINDINGS: Bones/Joint/Cartilage Rounded lytic lesion within the superior anterior aspect of the right femoral head is unchanged from CT right hip performed earlier today. The smaller rounded lucencies at the junction of the right head and neck are also stable. Please refer to that exam for full description of findings. The remainder  of the right femur is unremarkable, with no evidence of an acute or destructive process. Likely small bone island within the inter trochanteric region of the proximal right femur reference image 50/9, with no associated metabolic activity on recent PET scan. Mild osteoarthritis of the right hip and knee. No evidence of right hip or knee effusion. Ligaments Suboptimally assessed by CT. Muscles and Tendons No acute abnormality. Soft tissues No evidence of subcutaneous edema or fluid collection. No soft tissue masses identified on this unenhanced exam. Stable uterine fibroids partially visualized. Reconstructed images demonstrate no additional findings. IMPRESSION: 1. Stable lucent  lesions within the right femoral head and neck region as above. The largest within the superior aspect of the right femoral head consistent with metastatic disease given recent increased radiotracer uptake on PET scan. 2. No other acute or destructive lesions within the remainder of the right femur. 3. Mild right hip and knee osteoarthritis. Electronically Signed   By: Sharlet Salina M.D.   On: 09/29/2023 16:21        Scheduled Meds:  acetaminophen  1,000 mg Oral Q6H   aspirin  81 mg Oral BID   pantoprazole  40 mg Oral Daily   polyethylene glycol  17 g Oral Daily   senna  2 tablet Oral QHS   sodium chloride flush  10 mL Intravenous Q12H   Continuous Infusions:   LOS: 1 day    Time spent: 35 Minutes.     Alba Cory, MD [facility]   If 7PM-7AM, please contact night-coverage [email or web address]  10/01/2023, 1:46 PM

## 2023-10-01 NOTE — Telephone Encounter (Signed)
Called pt per md to let pt know MD will see her today at the hospital and will call her husband if he is not there with her.

## 2023-10-01 NOTE — Plan of Care (Signed)
  Problem: Clinical Measurements: Goal: Will remain free from infection Outcome: Progressing Goal: Respiratory complications will improve Outcome: Progressing   Problem: Activity: Goal: Risk for activity intolerance will decrease Outcome: Progressing   Problem: Elimination: Goal: Will not experience complications related to bowel motility Outcome: Progressing   Problem: Safety: Goal: Ability to remain free from injury will improve Outcome: Progressing

## 2023-10-02 ENCOUNTER — Telehealth (HOSPITAL_COMMUNITY): Payer: Self-pay | Admitting: Pharmacy Technician

## 2023-10-02 ENCOUNTER — Other Ambulatory Visit (HOSPITAL_COMMUNITY): Payer: Self-pay

## 2023-10-02 DIAGNOSIS — R52 Pain, unspecified: Secondary | ICD-10-CM | POA: Diagnosis not present

## 2023-10-02 LAB — CBC
HCT: 24.7 % — ABNORMAL LOW (ref 36.0–46.0)
Hemoglobin: 8.3 g/dL — ABNORMAL LOW (ref 12.0–15.0)
MCH: 32.8 pg (ref 26.0–34.0)
MCHC: 33.6 g/dL (ref 30.0–36.0)
MCV: 97.6 fL (ref 80.0–100.0)
Platelets: 167 10*3/uL (ref 150–400)
RBC: 2.53 MIL/uL — ABNORMAL LOW (ref 3.87–5.11)
RDW: 15.9 % — ABNORMAL HIGH (ref 11.5–15.5)
WBC: 5.5 10*3/uL (ref 4.0–10.5)
nRBC: 0 % (ref 0.0–0.2)

## 2023-10-02 MED ORDER — LIDOCAINE 5 % EX PTCH: 1.0000 | MEDICATED_PATCH | CUTANEOUS | 0 refills | Status: DC

## 2023-10-02 MED ORDER — FOLIC ACID 1 MG PO TABS
1.0000 mg | ORAL_TABLET | Freq: Every day | ORAL | Status: DC
  Administered 2023-10-02: 1 mg via ORAL
  Filled 2023-10-02: qty 1

## 2023-10-02 MED ORDER — ASPIRIN 81 MG PO TBEC: 81.0000 mg | DELAYED_RELEASE_TABLET | Freq: Two times a day (BID) | ORAL | 0 refills | Status: AC

## 2023-10-02 MED ORDER — OXYCODONE HCL 5 MG PO TABS: 5.0000 mg | ORAL_TABLET | Freq: Four times a day (QID) | ORAL | 0 refills | Status: AC | PRN

## 2023-10-02 MED ORDER — SENNA 8.6 MG PO TABS: 2.0000 | ORAL_TABLET | Freq: Every day | ORAL | 0 refills | Status: DC

## 2023-10-02 MED ORDER — RIVAROXABAN 10 MG PO TABS: 10.0000 mg | ORAL_TABLET | Freq: Every day | ORAL | 0 refills | Status: DC

## 2023-10-02 MED ORDER — FERROUS SULFATE 325 (65 FE) MG PO TABS: 325.0000 mg | ORAL_TABLET | Freq: Every day | ORAL | 3 refills | Status: DC

## 2023-10-02 MED ORDER — FOLIC ACID 1 MG PO TABS: 1.0000 mg | ORAL_TABLET | Freq: Every day | ORAL | 0 refills | Status: AC

## 2023-10-02 MED ORDER — POLYETHYLENE GLYCOL 3350 17 G PO PACK: 17.0000 g | PACK | Freq: Two times a day (BID) | ORAL | 0 refills | Status: DC

## 2023-10-02 MED ORDER — ACETAMINOPHEN 500 MG PO TABS: 1000.0000 mg | ORAL_TABLET | Freq: Four times a day (QID) | ORAL | 0 refills | Status: DC | PRN

## 2023-10-02 MED ORDER — LIDOCAINE 5 % EX PTCH
1.0000 | MEDICATED_PATCH | CUTANEOUS | Status: DC
  Administered 2023-10-02: 1 via TRANSDERMAL
  Filled 2023-10-02: qty 1

## 2023-10-02 MED ORDER — FERROUS SULFATE 325 (65 FE) MG PO TABS
325.0000 mg | ORAL_TABLET | Freq: Every day | ORAL | Status: DC
  Administered 2023-10-02: 325 mg via ORAL
  Filled 2023-10-02: qty 1

## 2023-10-02 MED ORDER — BISACODYL 5 MG PO TBEC
5.0000 mg | DELAYED_RELEASE_TABLET | Freq: Once | ORAL | Status: AC
  Administered 2023-10-02: 5 mg via ORAL
  Filled 2023-10-02: qty 1

## 2023-10-02 NOTE — Plan of Care (Signed)
  Problem: Clinical Measurements: Goal: Will remain free from infection Outcome: Progressing   Problem: Activity: Goal: Risk for activity intolerance will decrease Outcome: Progressing   Problem: Coping: Goal: Level of anxiety will decrease Outcome: Progressing   Problem: Pain Management: Goal: General experience of comfort will improve Outcome: Progressing   Problem: Safety: Goal: Ability to remain free from injury will improve Outcome: Progressing

## 2023-10-02 NOTE — Progress Notes (Signed)
Patient ID: Kristina Nicholson, female   DOB: February 20, 1966, 57 y.o.   MRN: [id number] Subjective: 2 Days Post-Op Procedure(s) (LRB): TOTAL HIP ARTHROPLASTY ANTERIOR APPROACH (Right)    Patient reports pain as mild to moderate with right thigh soreness noted  Also, raises concerns about leg lengths  Objective:   VITALS:   Vitals:   10/02/23 0625 10/02/23 0925  BP: 119/63 124/62  Pulse: 76 75  Resp: 16 16  Temp: 98.7 F (37.1 C) 97.7 F (36.5 C)  SpO2: 98% 100%    Neurovascular intact Incision: dressing C/D/I Tender to palpation, right thigh  LABS Recent Labs    09/30/23 0355 10/01/23 0346 10/02/23 0350  HGB 10.3* 9.3* 8.3*  HCT 31.2* 27.3* 24.7*  WBC 2.7* 6.2 5.5  PLT 214 180 167    Recent Labs    09/29/23 1254 09/30/23 0355 10/01/23 0346  NA 137 138 137  K 3.6 3.7 3.7  BUN 15 17 15   CREATININE 0.96 0.87 0.87  GLUCOSE 93 85 156*    No results for input(s): "LABPT", "INR" in the last 72 hours.   Assessment/Plan: 2 Days Post-Op Procedure(s) (LRB): TOTAL HIP ARTHROPLASTY ANTERIOR APPROACH (Right)   Up with therapy Doing very well at this point Likely to be discharged today RTC to see [place] in 2 weeks  I reviewed her and her husband the leg length concerns.  I reviewed with them my review of her Xrays that show her lesser trochanters to be very similar in orientation as compared to the ischium bilaterally.  Maybe within a couple millimeters. We will discuss this further in follow up but encourage her to focus on gait training and strengthening

## 2023-10-02 NOTE — Progress Notes (Signed)
Physical Therapy Treatment Patient Details Name: Kristina Nicholson MRN: [id number] DOB: 04-07-1966 Today's Date: 10/02/2023   History of Present Illness 57 y.o. female  who presented with severe right hip pain since yesterday evening   CT  right hip showed a rounded lytic lesion within the right femoral head measuring 14 mm with cortical breakthrough superiorly, corresponds to the hypermetabolic lesion seen on the previous PET scan.  Additional 6 mm Disprin chronic rounded lucent lesion anteriorly about the femoral head neck junction favored to be a benign synovial herniation although metastatic lesion is not totally excluded.  Mild right hip osteoarthritis.  per PET scan August prominent lesions in the left humeral  head, and the left scapular body.   Pt is now s/p R DA THA on 09/29/23.  PMH:  chronic left shoulder pain, left breast cancer with, left mastectomy, metastasis to bone, external beam radiation of left chest mass, left supraclavicular area, horseshoe kidney, fibroids    PT Comments  Pt making excellent progress. Reviewed mobility as below, HEP and progression. Cautioned to avoid over doing it at home and initially limiting trips up/down flight of stairs. Pt and husband feel ready to d/c home, all questions encouraged and answered.    If plan is discharge home, recommend the following: A little help with bathing/dressing/bathroom;Help with stairs or ramp for entrance;Assistance with cooking/housework   Can travel by private Scientist, research (medical) walker (2 wheels) (PT adjusted)    Recommendations for Other Services       Precautions / Restrictions Precautions Precautions: Fall Restrictions LUE Weight Bearing: Weight bearing as tolerated RLE Weight Bearing: Weight bearing as tolerated Other Position/Activity Restrictions: lesions L humerus and scapula     Mobility  Bed Mobility Overal bed mobility: Needs Assistance Bed Mobility: Sit to  Supine       Sit to supine: Supervision   General bed mobility comments: instructed in use of leg lifter    Transfers Overall transfer level: Needs assistance Equipment used: Rolling walker (2 wheels) Transfers: Sit to/from Stand Sit to Stand: Contact guard assist           General transfer comment: cues for hand placement    Ambulation/Gait Ambulation/Gait assistance: Supervision, Contact guard assist Gait Distance (Feet): 350 Feet Assistive device: Rolling walker (2 wheels) Gait Pattern/deviations: Step-to pattern, Step-through pattern, Decreased weight shift to right       General Gait Details: cues for sequence and RW position. steady with RW, no LOB   Stairs Stairs: Yes Stairs assistance: Contact guard assist Stair Management: One rail Left, Step to pattern, Sideways Number of Stairs: 5 General stair comments: cues for sequence and technique, no LOB. spouse present for session   Wheelchair Mobility     Tilt Bed    Modified Rankin (Stroke Patients Only)       Balance                                            Cognition Arousal: Alert Behavior During Therapy: WFL for tasks assessed/performed Overall Cognitive Status: Within Functional Limits for tasks assessed                                          Exercises General  Exercises - Lower Extremity Ankle Circles/Pumps: AROM, Both, 10 reps Quad Sets: AROM, Both, 10 reps Heel Slides: AROM, AAROM, Right, 10 reps Hip ABduction/ADduction: AAROM, Right, 5 reps    General Comments  Reviewed tub transfer after stair training per pt and spouse request. Pt CGA, again cautioned pt not to perform transfers/shower without assist - spouse present and reinforced with pt      Pertinent Vitals/Pain Pain Assessment Pain Assessment: Faces Faces Pain Scale: Hurts little more Pain Location: right hip and L shoulder Pain Descriptors / Indicators: Discomfort, Tightness,  Sore Pain Intervention(s): Limited activity within patient's tolerance, Monitored during session, Repositioned, Premedicated before session    Home Living                          Prior Function            PT Goals (current goals can now be found in the care plan section) Acute Rehab PT Goals Patient Stated Goal: home soon. less pain PT Goal Formulation: With patient Time For Goal Achievement: 10/15/23 Potential to Achieve Goals: Good Progress towards PT goals: Progressing toward goals    Frequency    7X/week      PT Plan      Co-evaluation              AM-PAC PT "6 Clicks" Mobility   Outcome Measure  Help needed turning from your back to your side while in a flat bed without using bedrails?: A Little Help needed moving from lying on your back to sitting on the side of a flat bed without using bedrails?: None Help needed moving to and from a bed to a chair (including a wheelchair)?: A Little Help needed standing up from a chair using your arms (e.g., wheelchair or bedside chair)?: A Little Help needed to walk in hospital room?: A Little Help needed climbing 3-5 steps with a railing? : A Little 6 Click Score: 19    End of Session Equipment Utilized During Treatment: Gait belt Activity Tolerance: Patient tolerated treatment well Patient left: in bed;with call bell/phone within reach;with family/visitor present   PT Visit Diagnosis: Other abnormalities of gait and mobility (R26.89);Difficulty in walking, not elsewhere classified (R26.2)     Time: 1610-9604 PT Time Calculation (min) (ACUTE ONLY): 34 min  Charges:    $Gait Training: 23-37 mins PT General Charges $$ ACUTE PT VISIT: 1 Visit                     Timber Lucarelli, PT  Acute Rehab Dept [facility]) [phone]  10/02/2023    [facility] 10/02/2023, 12:23 PM

## 2023-10-02 NOTE — Telephone Encounter (Signed)
 Clinical Questions have been submitted

## 2023-10-02 NOTE — Discharge Summary (Signed)
Physician Discharge Summary   Patient: Kristina Nicholson MRN: [id number] DOB: Sep 29, 1966  Admit date:     09/29/2023  Discharge date: 10/02/23  Discharge Physician: Alba Cory   PCP: Patient, No Pcp Per   Recommendations at discharge:   Needs follow up with Ortho in 1 -2 weeks post sx.  Needs to follow up with Dr Mosetta Putt for further care of Breast cancer.   Discharge Diagnoses: Principal Problem:   Intractable pain Active Problems:   Malignant neoplasm of upper-inner quadrant of left breast in female, estrogen receptor positive (HCC)   Metastasis to bone (HCC)   Normocytic anemia   S/P total right hip arthroplasty  Resolved Problems:   * No resolved hospital problems. *  Hospital Course: 57 year old with past medical history significant for chronic left shoulder pain, history of left breast cancer with positive estrogen receptors, status post left mastectomy, metastasis to bone, status post external beam radiation of the left chest mass, horseshoe kidney, who presents with severe right hip pain that is started the evening prior to admission. Right hip x-ray was negative for fracture. CT of the right hip showed rounded lytic lesion within the right femoral head measuring 14 mm with cortical breakthrough superiorly which correspond to hypermetabolic lesion seen on previous PET scan. Additional 6 mm circumscribed rounded lucent lesions anteriorly at the femoral neck junction favored to be benign synovial herniation although metastatic lesion is not totally excluded.   Assessment and Plan: 1-Right Hip Metastatic Diseases:  Intractable Hip Pain:  -Orthopedic has been consulted.  -Underwent Right total Hip replacement anterior approach by Dr Charlann Boxer on 11/17 IV morphine PRN, Oxy PRN.  Bowel regimen. Miralax, Senna.  Follow up PT recommendation. HH PT, family and patient decline DVT prophylaxis per ortho: started on Xarelto Stable for discharge    2-Malignant neoplasm of the (ER  quadrant of the left breast estrogen positive with metastatic disease, Stage IV:  -Follow-up with Dr. Mosetta Putt -plan to hold    Verzenio tfor now at discharge.    Normocytic anemia: Leukopenia Monitor hb down to 9.  Discharge on folic acid and Ferrous sulfate  Estimated body mass index is 21.61 kg/m as calculated from the following:   Height as of this encounter: 5\' 3"  (1.6 m).   Weight as of this encounter: 55.3 kg.          Consultants: Dr Charlann Boxer  Procedures performed: -Underwent Right total Hip replacement anterior approach by Dr Charlann Boxer on 11/17 Disposition: Home Diet recommendation:  Discharge Diet Orders (From admission, onward)     Start     Ordered   10/02/23 0000  Diet general        10/02/23 0935           Regular diet DISCHARGE MEDICATION: Allergies as of 10/02/2023   No Known Allergies      Medication List     STOP taking these medications    abemaciclib 100 MG tablet Commonly known as: Verzenio   gabapentin 100 MG capsule Commonly known as: Neurontin   HYDROcodone-acetaminophen 5-325 MG tablet Commonly known as: NORCO/VICODIN   ibuprofen 600 MG tablet Commonly known as: ADVIL   oxyCODONE-acetaminophen 5-325 MG tablet Commonly known as: PERCOCET/ROXICET       TAKE these medications    acetaminophen 500 MG tablet Commonly known as: TYLENOL Take 2 tablets (1,000 mg total) by mouth every 6 (six) hours as needed. Notes to patient: Last dose given 11/19 08:10am   aspirin EC 81 MG tablet  Take 1 tablet (81 mg total) by mouth in the morning and at bedtime for 21 days. Swallow whole. Start taking on: October 24, 2023 Notes to patient: Do not start until Dec 11th after finishing 21 days of Xarelto. Then only need to take for 3 weeks   CHLORELLA-SPIRULINA COMPLEX PO Take 1 capsule by mouth daily as needed (to aid digestive health). Notes to patient: Resume home regimen   ferrous sulfate 325 (65 FE) MG tablet Take 1 tablet (325 mg total) by  mouth daily with breakfast. Start taking on: October 03, 2023   folic acid 1 MG tablet Commonly known as: FOLVITE Take 1 tablet (1 mg total) by mouth daily for 7 days. Start taking on: October 03, 2023   letrozole 2.5 MG tablet Commonly known as: FEMARA Take 1 tablet (2.5 mg total) by mouth daily.   lidocaine 5 % Commonly known as: LIDODERM Place 1 patch onto the skin daily. Remove & Discard patch within 12 hours or as directed by MD   loperamide 2 MG tablet Commonly known as: IMODIUM A-D Take 2 mg by mouth 4 (four) times daily as needed for diarrhea or loose stools. Notes to patient: Resume home regimen   MULTIVITAMIN WOMEN PO Take 1 tablet by mouth daily with breakfast. Notes to patient: Resume home regimen   oxyCODONE 5 MG immediate release tablet Commonly known as: Oxy IR/ROXICODONE Take 1 tablet (5 mg total) by mouth every 6 (six) hours as needed for up to 3 days for moderate pain (pain score 4-6). Notes to patient: Last dose given 11/17 09:45pm   polyethylene glycol 17 g packet Commonly known as: MIRALAX / GLYCOLAX Take 17 g by mouth 2 (two) times daily.   rivaroxaban 10 MG Tabs tablet Commonly known as: XARELTO Take 1 tablet (10 mg total) by mouth daily for 21 days. Start taking on: October 03, 2023 Notes to patient: Take only for 3 weeks after surgery   senna 8.6 MG Tabs tablet Commonly known as: SENOKOT Take 2 tablets (17.2 mg total) by mouth at bedtime.               Durable Medical Equipment  (From admission, onward)           Start     Ordered   10/01/23 1538  For home use only DME Walker rolling  Once       Question Answer Comment  Walker: With 5 Inch Wheels   Patient needs a walker to treat with the following condition Balance disorder      10/01/23 1537            Discharge Exam: Filed Weights   09/29/23 1155  Weight: 55.3 kg   General; NAD  Condition at discharge: stable  The results of significant diagnostics from  this hospitalization (including imaging, microbiology, ancillary and laboratory) are listed below for reference.   Imaging Studies: DG HIP UNILAT WITH PELVIS 1V RIGHT  Result Date: 09/30/2023 CLINICAL DATA:  Elective surgery. EXAM: DG HIP (WITH OR WITHOUT PELVIS) 1V RIGHT COMPARISON:  None Available. FINDINGS: Five fluoroscopic spot views of the pelvis and right hip obtained in the operating room. Sequential images during hip arthroplasty. Fluoroscopy time 7 seconds. Dose 0.7 mGy. IMPRESSION: Intraoperative fluoroscopy during right hip arthroplasty. Electronically Signed   By: Narda Rutherford M.D.   On: 09/30/2023 16:11   DG Pelvis Portable  Result Date: 09/30/2023 CLINICAL DATA:  Postop in PACU. EXAM: PORTABLE PELVIS 1-2 VIEWS COMPARISON:  Preoperative imaging  FINDINGS: Right hip arthroplasty in expected alignment. No periprosthetic lucency or fracture. Recent postsurgical change includes air and edema in the soft tissues. IMPRESSION: Right hip arthroplasty without immediate postoperative complication. Electronically Signed   By: Narda Rutherford M.D.   On: 09/30/2023 16:10   DG C-Arm 1-60 Min-No Report  Result Date: 09/30/2023 Fluoroscopy was utilized by the requesting physician.  No radiographic interpretation.   CT FEMUR RIGHT WO CONTRAST  Result Date: 09/29/2023 CLINICAL DATA:  Right hip pain, history of breast cancer, metastatic lesion in right femoral head on earlier CT right hip EXAM: CT OF THE LOWER RIGHT EXTREMITY WITHOUT CONTRAST TECHNIQUE: Multidetector CT imaging of the right lower extremity was performed according to the standard protocol. RADIATION DOSE REDUCTION: This exam was performed according to the departmental dose-optimization program which includes automated exposure control, adjustment of the mA and/or kV according to patient size and/or use of iterative reconstruction technique. COMPARISON:  09/29/2023, 06/19/2023 FINDINGS: Bones/Joint/Cartilage Rounded lytic lesion  within the superior anterior aspect of the right femoral head is unchanged from CT right hip performed earlier today. The smaller rounded lucencies at the junction of the right head and neck are also stable. Please refer to that exam for full description of findings. The remainder of the right femur is unremarkable, with no evidence of an acute or destructive process. Likely small bone island within the inter trochanteric region of the proximal right femur reference image 50/9, with no associated metabolic activity on recent PET scan. Mild osteoarthritis of the right hip and knee. No evidence of right hip or knee effusion. Ligaments Suboptimally assessed by CT. Muscles and Tendons No acute abnormality. Soft tissues No evidence of subcutaneous edema or fluid collection. No soft tissue masses identified on this unenhanced exam. Stable uterine fibroids partially visualized. Reconstructed images demonstrate no additional findings. IMPRESSION: 1. Stable lucent lesions within the right femoral head and neck region as above. The largest within the superior aspect of the right femoral head consistent with metastatic disease given recent increased radiotracer uptake on PET scan. 2. No other acute or destructive lesions within the remainder of the right femur. 3. Mild right hip and knee osteoarthritis. Electronically Signed   By: Sharlet Salina M.D.   On: 09/29/2023 16:21   CT Hip Right Wo Contrast  Result Date: 09/29/2023 CLINICAL DATA:  Right hip pain.  History of breast cancer EXAM: CT OF THE RIGHT HIP WITHOUT CONTRAST TECHNIQUE: Multidetector CT imaging of the right hip was performed according to the standard protocol. Multiplanar CT image reconstructions were also generated. RADIATION DOSE REDUCTION: This exam was performed according to the departmental dose-optimization program which includes automated exposure control, adjustment of the mA and/or kV according to patient size and/or use of iterative reconstruction  technique. COMPARISON:  X-ray 09/29/2023.  PET-CT 06/19/2023 FINDINGS: Bones/Joint/Cartilage Rounded lytic lesion in the anterosuperior aspect of the right femoral head measuring 14 mm in diameter. There is cortical breakthrough superiorly. This lesion has partially sclerotic margins. Additionally there is a 6 mm circumscribed rounded lucent lesion anteriorly at the femoral head-neck junction with well-defined sclerotic margins. No additional lytic or sclerotic bone lesions. No fracture or dislocation. Mild right hip joint space narrowing. No hip joint effusion is evident. Ligaments Suboptimally assessed by CT. Muscles and Tendons No acute musculotendinous abnormality by CT. Soft tissues No soft tissue swelling or fluid collections. Fibroid uterus. No inguinal lymphadenopathy. IMPRESSION: 1. Rounded lytic lesion within the right femoral head measuring 14 mm with cortical breakthrough superiorly. This corresponds  to hypermetabolic lesion seen on the previous PET-CT and is compatible with metastatic disease in the setting of known breast cancer. 2. Additional 6 mm circumscribed rounded lucent lesion anteriorly at the femoral head-neck junction. This is favored to represent a benign synovial herniation pit although an additional tiny metastatic lesion is not excluded. 3. Mild right hip osteoarthritis.  No fracture or dislocation. 4. Fibroid uterus. Electronically Signed   By: Duanne Guess D.O.   On: 09/29/2023 14:28   DG Hip Unilat W or Wo Pelvis 2-3 Views Right  Result Date: 09/29/2023 CLINICAL DATA:  Right hip pain beginning yesterday. No known injury. EXAM: DG HIP (WITH OR WITHOUT PELVIS) 2-3V RIGHT COMPARISON:  None Available. FINDINGS: There is no evidence of hip fracture or dislocation. There is no evidence of arthropathy or other significant abnormality IMPRESSION: Negative. Electronically Signed   By: Danae Orleans M.D.   On: 09/29/2023 12:45    Microbiology: Results for orders placed or performed in  visit on 06/23/14  Urine culture     Status: None   Collection Time: 06/23/14  6:52 PM   Specimen: Urine  Result Value Ref Range Status   Colony Count 50,000 COLONIES/ML  Final   Organism ID, Bacteria Multiple bacterial morphotypes present, none  Final   Organism ID, Bacteria predominant. Suggest appropriate recollection if  Final   Organism ID, Bacteria clinically indicated.  Final    Labs: CBC: Recent Labs  Lab 09/29/23 1254 09/30/23 0355 10/01/23 0346 10/02/23 0350  WBC 4.0 2.7* 6.2 5.5  NEUTROABS 2.4  --   --   --   HGB 10.4* 10.3* 9.3* 8.3*  HCT 30.2* 31.2* 27.3* 24.7*  MCV 98.4 100.6* 98.2 97.6  PLT 214 214 180 167   Basic Metabolic Panel: Recent Labs  Lab 09/29/23 1254 09/30/23 0355 10/01/23 0346  NA 137 138 137  K 3.6 3.7 3.7  CL 101 106 106  CO2 28 25 23   GLUCOSE 93 85 156*  BUN 15 17 15   CREATININE 0.96 0.87 0.87  CALCIUM 8.9 8.7* 8.5*   Liver Function Tests: Recent Labs  Lab 09/30/23 0355  AST 17  ALT 19  ALKPHOS 44  BILITOT 0.6  PROT 6.5  ALBUMIN 3.5   CBG: No results for input(s): "GLUCAP" in the last 168 hours.  Discharge time spent: greater than 30 minutes.  Signed: Alba Cory, MD [facility] 10/02/2023

## 2023-10-02 NOTE — Progress Notes (Signed)
Provided discharge education/instructions, all questions answered. Pt is not in any distress, discharged home with all of her belongings accompanied by husband.

## 2023-10-02 NOTE — TOC Transition Note (Signed)
Transition of Care [facility]) - CM/SW Discharge Note  Patient Details  Name: Kristina Nicholson MRN: [id number] Date of Birth: Jun 25, 1966  [facility]) CM/SW Contact:  Ewing Schlein, LCSW Phone Number: 10/02/2023, 11:24 AM  Clinical Narrative: CSW met with patient and spouse to discuss PT options. Patient and spouse declined HH and OPPT at this time. Hospitalist updated. TOC signing off.   Final next level of care: Home/Self Care Barriers to Discharge: Barriers Resolved  Patient Goals and CMS Choice CMS Medicare.gov Compare Post Acute Care list provided to:: Patient Choice offered to / list presented to : Patient, Spouse  Discharge Plan and Services Additional resources added to the After Visit Summary for   In-house Referral: Clinical Social Work Post Acute Care Choice: Durable Medical Equipment          DME Arranged: Dan Humphreys rolling DME Agency: [employer] Date DME Agency Contacted: 10/01/23 Time DME Agency Contacted: 1540 Representative spoke with at DME Agency: Zack  Social Determinants of Health (SDOH) Interventions SDOH Screenings   Food Insecurity: No Food Insecurity (09/29/2023)  Housing: Low Risk  (09/29/2023)  Transportation Needs: No Transportation Needs (09/29/2023)  Utilities: Not At Risk (09/29/2023)  Depression (PHQ2-9): Low Risk  (09/07/2023)  Tobacco Use: High Risk (09/29/2023)   Readmission Risk Interventions     No data to display

## 2023-10-02 NOTE — Telephone Encounter (Signed)
Pharmacy Patient Advocate Encounter   Received notification from Fax that prior authorization for Lidocaine 5% patches is required/requested.   Insurance verification completed.   The patient is insured through [facility] .   Per test claim: PA required; PA started via CoverMyMeds. KEY [id number] . Waiting for clinical questions to populate.

## 2023-10-02 NOTE — Plan of Care (Signed)

## 2023-10-03 NOTE — Telephone Encounter (Signed)
Pharmacy Patient Advocate Encounter  Received notification from [facility] that Prior Authorization for Lidocaine 5% patches  has been DENIED.  Full denial letter will be uploaded to the media tab. See denial reason below.   PA #/Case ID/Reference #: [id number]

## 2023-10-04 LAB — SURGICAL PATHOLOGY

## 2023-10-08 ENCOUNTER — Other Ambulatory Visit: Payer: BC Managed Care – PPO

## 2023-10-08 ENCOUNTER — Other Ambulatory Visit: Payer: Self-pay

## 2023-10-08 ENCOUNTER — Other Ambulatory Visit (HOSPITAL_COMMUNITY): Payer: BC Managed Care – PPO

## 2023-10-08 ENCOUNTER — Encounter: Payer: Self-pay | Admitting: Radiation Oncology

## 2023-10-08 DIAGNOSIS — C50212 Malignant neoplasm of upper-inner quadrant of left female breast: Secondary | ICD-10-CM

## 2023-10-08 DIAGNOSIS — C7951 Secondary malignant neoplasm of bone: Secondary | ICD-10-CM

## 2023-10-08 DIAGNOSIS — Z170 Estrogen receptor positive status [ER+]: Secondary | ICD-10-CM

## 2023-10-08 NOTE — Progress Notes (Signed)
Verbal order w/readback from Dr. Mosetta Putt for [employer] to be done 10/15/2023.  Placed order for [employer] electronically on [employer]'s website with backup documentation submitted online.  Printed requisition to go with blood sample given to [employer] in [place].

## 2023-10-12 NOTE — Progress Notes (Unsigned)
Palliative [facility]  [phone] [phone]) [phone]   Name: Kristina Nicholson Date: 10/12/2023 MRN: [id number]  DOB: 03-13-1966  Patient Care Team: Patient, No Pcp Per as PCP - General (General Practice) Malachy Mood, MD as Consulting Physician (Hematology) Griselda Miner, MD as Consulting Physician (General Surgery) Lonie Peak, MD as Attending Physician (Radiation Oncology)    REASON FOR CONSULTATION: Kristina Nicholson is a 57 y.o. female with oncologic medical history including estrogen receptor positive breast cancer (04/2017) with metastatic disease to the bone. Palliative ask to see for symptom management and goals of care.    SOCIAL HISTORY:     reports that she has been smoking cigarettes. She started smoking about 38 years ago. She has a 8 pack-year smoking history. She has never used smokeless tobacco. She reports that she does not drink alcohol and does not use drugs.  ADVANCE DIRECTIVES:  None on file  CODE STATUS: Full code  PAST MEDICAL HISTORY: Past Medical History:  Diagnosis Date   Chronic left shoulder pain    Headache    History of external beam radiation therapy    07-31-2017  to  09-10-2017  LEft chest wass 50 gy in 25 fractions, Left Supraclavicular 45 Gy in 25 fractions, Left Chest wall scar boost 10 Gy in 5 fractions.   Horseshoe kidney    Malignant neoplasm of upper-inner quadrant of left breast in female, estrogen receptor positive (HCC) 04/18/2017   oncologist--- dr Mosetta Putt;   dx 06/ 2018 ,  Stage IIB, ER/PR+, G3, IDC/ DCIS;  06-13-2017 s/p left mastectomy w/ node dissection ;  completed radiation 09-10-2017;   recurrence w/ mets to bone 07/ 2024 , started chemotherapy   Metastatic cancer to bone (HCC) 05/2023   Uterine fibroid     PAST SURGICAL HISTORY:  Past Surgical History:  Procedure Laterality Date   APPENDECTOMY  2005   COLONOSCOPY  2016   GANGLION CYST EXCISION Right 09/24/2017    Procedure: EXCISION OF RIGHT DORSAL WRIST GANGLION;  Surgeon: Mack Hook, MD;  Location: [facility];  Service: Orthopedics;  Laterality: Right;   LAPAROSCOPIC SALPINGO OOPHERECTOMY Bilateral 08/24/2023   Procedure: LAPAROSCOPIC SALPINGO OOPHORECTOMY;  Surgeon: [name], DO;  Location: [facility];  Service: Gynecology;  Laterality: Bilateral;   LIPOMA EXCISION Left 06/13/2017   Procedure: EXCISION 3CM LIPOMA ON LEFT ARM;  Surgeon: Griselda Miner, MD;  Location: [facility] OR;  Service: General;  Laterality: Left;   MASTECTOMY W/ SENTINEL NODE BIOPSY Left 06/13/2017   Procedure: MASTECTOMY WITH SENTINEL LYMPH NODE BIOPSY;  Surgeon: Griselda Miner, MD;  Location: [facility] OR;  Service: General;  Laterality: Left;   TOTAL HIP ARTHROPLASTY Right 09/30/2023   Procedure: TOTAL HIP ARTHROPLASTY ANTERIOR APPROACH;  Surgeon: Durene Romans, MD;  Location: [facility];  Service: Orthopedics;  Laterality: Right;    HEMATOLOGY/ONCOLOGY HISTORY:  Oncology History Overview Note  Cancer Staging Malignant neoplasm of upper-inner quadrant of left breast in female, estrogen receptor positive (HCC) Staging form: Breast, AJCC 8th Edition - Clinical stage from 04/13/2017: Stage IIA (cT2, cN0, cM0, G2, ER: Positive, PR: Negative, HER2: Negative) - Signed by Malachy Mood, MD on 04/25/2017 - Pathologic stage from 06/13/2017: Stage IIB (pT2, pN1a, cM0, G1, ER: Positive, PR: Negative, HER2: Negative) - Signed by Malachy Mood, MD on 09/04/2017     Malignant neoplasm of upper-inner quadrant of left breast in female, estrogen receptor positive (HCC)  04/10/2017 Mammogram   Left breast  mammo and US showed an irregular hypoechoic shadowing mass at 9 o'clock 1 cm from the nipple measuring 2.3 x 1 x 2.4 cm. This corresponds well with the mass seen at mammography. No lymphadenopathy seen in the left axilla.   04/18/2017 Initial Diagnosis   Malignant neoplasm of upper-inner quadrant of left breast in female, estrogen  receptor positive (HCC)   04/22/2017 Receptors her2   Estrogen Receptor: 95%, POSITIVE, STRONG STAINING INTENSITY Progesterone Receptor: 0%, NEGATIVE Proliferation Marker Ki67: 10% HER2 -   04/22/2017 Initial Biopsy   Breast, left, needle core biopsy, 9:30 o'clock - INVASIVE DUCTAL CARCINOMA, G2 - DUCTAL CARCINOMA IN SITU.   06/13/2017 Surgery   Left breast mastectomy with sentinel lymph node biopsy performed by Dr Carolynne Edouard.    06/13/2017 Pathology Results   Breast, simple mastectomy, Left - INVASIVE DUCTAL CARCINOMA, GRADE I/III, SPANNING 2.5 CM. - DUCTAL CARCINOMA IN SITU, INTERMEDIATE GRADE. - LOBULAR NEOPLASIA (ATYPICAL LOBULAR HYPERPLASIA). - THE SURGICAL RESECTION MARGINS ARE NEGATIVE FOR CARCINOMA.  Lymph node, sentinel, biopsy, Left axillary #1 - METASTATIC CARCINOMA IN 1 OF 3 LYMPH NODE (1/3), WITH EXTRACAPSULAR EXTENSION.   06/13/2017 Pathology Results   Mammaprint with low risk disease.    07/31/2017 - 09/10/2017 Radiation Therapy   The patient began a course of radiotherapy over the left chest wall and axillary area, supervised by Dr Lonie Peak, MD.     Radiation treatment dates:   07/31/2017 - 09/10/2017   Site/dose:    1) Left Chest Wall / 50 Gy in 25 fractions 2) Left Supraclavicular / 45 Gy in 25 fractions 3) Left Chest Wall Scar Boost / 10 Gy in 5 fractions   Beams/energy:    1) Opposed tangents 3D/ 10 and 6 MV photons 2) Additional fields 3D / 10 MV and 6 MV photons 3) En face electrons / 6 MeV electrons   Narrative: The patient tolerated radiation treatment relatively well. She experienced mild fatigue. She reported pain, burning, and itching to the left breast and swelling under the left arm. On physical exam, she had dry peeling in the left UIQ of the breast and left axilla, as well as diffuse hyperpigmentation and erythema of the left chest wall. She is using the radiaplex cream to the treatment area and neosporin to the peeling areas.   10/13/2017 -   Anti-estrogen oral therapy   Tamoxifen 10mg  daily   04/11/2018 Mammogram   04/11/2018 Mammogram IMPRESSION: No mammographic evidence of malignancy. A result letter of this screening mammogram will be mailed directly to the patient.   04/11/2018 Mammogram   Screening Unilateral Right: No evidence of mammographic malignancy.      ALLERGIES:  has No Known Allergies.  MEDICATIONS:  Current Outpatient Medications  Medication Sig Dispense Refill   acetaminophen (TYLENOL) 500 MG tablet Take 2 tablets (1,000 mg total) by mouth every 6 (six) hours as needed. 30 tablet 0   [START ON 10/24/2023] aspirin EC 81 MG tablet Take 1 tablet (81 mg total) by mouth in the morning and at bedtime for 21 days. Swallow whole. 42 tablet 0   ferrous sulfate 325 (65 FE) MG tablet Take 1 tablet (325 mg total) by mouth daily with breakfast. 30 tablet 3   letrozole (FEMARA) 2.5 MG tablet Take 1 tablet (2.5 mg total) by mouth daily. (Patient not taking: Reported on 09/29/2023) 30 tablet 1   lidocaine (LIDODERM) 5 % Place 1 patch onto the skin daily. Remove & Discard patch within 12 hours or as directed by MD  30 patch 0   loperamide (IMODIUM A-D) 2 MG tablet Take 2 mg by mouth 4 (four) times daily as needed for diarrhea or loose stools.     Multiple Vitamins-Minerals (MULTIVITAMIN WOMEN PO) Take 1 tablet by mouth daily with breakfast.     Nutritional Supplements (CHLORELLA-SPIRULINA COMPLEX PO) Take 1 capsule by mouth daily as needed (to aid digestive health).     polyethylene glycol (MIRALAX / GLYCOLAX) 17 g packet Take 17 g by mouth 2 (two) times daily. 14 each 0   rivaroxaban (XARELTO) 10 MG TABS tablet Take 1 tablet (10 mg total) by mouth daily for 21 days. 21 tablet 0   senna (SENOKOT) 8.6 MG TABS tablet Take 2 tablets (17.2 mg total) by mouth at bedtime. 120 tablet 0   No current facility-administered medications for this visit.    VITAL SIGNS: LMP 06/22/2015 Comment: 06/08/17 vaginal bleeding for 4 days There  were no vitals filed for this visit.  Estimated body mass index is 21.61 kg/m as calculated from the following:   Height as of 09/29/23: 5\' 3"  (1.6 m).   Weight as of 09/29/23: 122 lb (55.3 kg).  LABS: CBC:    Component Value Date/Time   WBC 5.5 10/02/2023 0350   HGB 8.3 (L) 10/02/2023 0350   HGB 11.6 (L) 09/03/2023 1423   HGB 12.6 11/09/2017 0900   HCT 24.7 (L) 10/02/2023 0350   HCT 38.0 11/09/2017 0900   PLT 167 10/02/2023 0350   PLT 280 09/03/2023 1423   PLT 203 11/09/2017 0900   MCV 97.6 10/02/2023 0350   MCV 93.6 11/09/2017 0900   NEUTROABS 2.4 09/29/2023 1254   NEUTROABS 2.0 11/09/2017 0900   LYMPHSABS 1.2 09/29/2023 1254   LYMPHSABS 1.4 11/09/2017 0900   MONOABS 0.3 09/29/2023 1254   MONOABS 0.4 11/09/2017 0900   EOSABS 0.1 09/29/2023 1254   EOSABS 0.1 11/09/2017 0900   BASOSABS 0.0 09/29/2023 1254   BASOSABS 0.0 11/09/2017 0900   Comprehensive Metabolic Panel:    Component Value Date/Time   NA 137 10/01/2023 0346   NA 141 11/09/2017 0900   K 3.7 10/01/2023 0346   K 4.5 11/09/2017 0900   CL 106 10/01/2023 0346   CO2 23 10/01/2023 0346   CO2 28 11/09/2017 0900   BUN 15 10/01/2023 0346   BUN 11.9 11/09/2017 0900   CREATININE 0.87 10/01/2023 0346   CREATININE 0.92 09/03/2023 1423   CREATININE 0.8 11/09/2017 0900   GLUCOSE 156 (H) 10/01/2023 0346   GLUCOSE 98 11/09/2017 0900   CALCIUM 8.5 (L) 10/01/2023 0346   CALCIUM 9.1 11/09/2017 0900   AST 17 09/30/2023 0355   AST 15 09/03/2023 1423   AST 14 11/09/2017 0900   ALT 19 09/30/2023 0355   ALT 17 09/03/2023 1423   ALT 10 11/09/2017 0900   ALKPHOS 44 09/30/2023 0355   ALKPHOS 57 11/09/2017 0900   BILITOT 0.6 09/30/2023 0355   BILITOT 0.4 09/03/2023 1423   BILITOT 0.64 11/09/2017 0900   PROT 6.5 09/30/2023 0355   PROT 6.8 11/09/2017 0900   ALBUMIN 3.5 09/30/2023 0355   ALBUMIN 3.8 11/09/2017 0900    RADIOGRAPHIC STUDIES: CT FEMUR RIGHT WO CONTRAST  Result Date: 09/29/2023 CLINICAL DATA:  Right  hip pain, history of breast cancer, metastatic lesion in right femoral head on earlier CT right hip EXAM: CT OF THE LOWER RIGHT EXTREMITY WITHOUT CONTRAST TECHNIQUE: Multidetector CT imaging of the right lower extremity was performed according to the standard protocol. RADIATION DOSE REDUCTION: This  exam was performed according to the departmental dose-optimization program which includes automated exposure control, adjustment of the mA and/or kV according to patient size and/or use of iterative reconstruction technique. COMPARISON:  09/29/2023, 06/19/2023 FINDINGS: Bones/Joint/Cartilage Rounded lytic lesion within the superior anterior aspect of the right femoral head is unchanged from CT right hip performed earlier today. The smaller rounded lucencies at the junction of the right head and neck are also stable. Please refer to that exam for full description of findings. The remainder of the right femur is unremarkable, with no evidence of an acute or destructive process. Likely small bone island within the inter trochanteric region of the proximal right femur reference image 50/9, with no associated metabolic activity on recent PET scan. Mild osteoarthritis of the right hip and knee. No evidence of right hip or knee effusion. Ligaments Suboptimally assessed by CT. Muscles and Tendons No acute abnormality. Soft tissues No evidence of subcutaneous edema or fluid collection. No soft tissue masses identified on this unenhanced exam. Stable uterine fibroids partially visualized. Reconstructed images demonstrate no additional findings. IMPRESSION: 1. Stable lucent lesions within the right femoral head and neck region as above. The largest within the superior aspect of the right femoral head consistent with metastatic disease given recent increased radiotracer uptake on PET scan. 2. No other acute or destructive lesions within the remainder of the right femur. 3. Mild right hip and knee osteoarthritis. Electronically  Signed   By: Sharlet Salina M.D.   On: 09/29/2023 16:21   CT Hip Right Wo Contrast  Result Date: 09/29/2023 CLINICAL DATA:  Right hip pain.  History of breast cancer EXAM: CT OF THE RIGHT HIP WITHOUT CONTRAST TECHNIQUE: Multidetector CT imaging of the right hip was performed according to the standard protocol. Multiplanar CT image reconstructions were also generated. RADIATION DOSE REDUCTION: This exam was performed according to the departmental dose-optimization program which includes automated exposure control, adjustment of the mA and/or kV according to patient size and/or use of iterative reconstruction technique. COMPARISON:  X-ray 09/29/2023.  PET-CT 06/19/2023 FINDINGS: Bones/Joint/Cartilage Rounded lytic lesion in the anterosuperior aspect of the right femoral head measuring 14 mm in diameter. There is cortical breakthrough superiorly. This lesion has partially sclerotic margins. Additionally there is a 6 mm circumscribed rounded lucent lesion anteriorly at the femoral head-neck junction with well-defined sclerotic margins. No additional lytic or sclerotic bone lesions. No fracture or dislocation. Mild right hip joint space narrowing. No hip joint effusion is evident. Ligaments Suboptimally assessed by CT. Muscles and Tendons No acute musculotendinous abnormality by CT. Soft tissues No soft tissue swelling or fluid collections. Fibroid uterus. No inguinal lymphadenopathy. IMPRESSION: 1. Rounded lytic lesion within the right femoral head measuring 14 mm with cortical breakthrough superiorly. This corresponds to hypermetabolic lesion seen on the previous PET-CT and is compatible with metastatic disease in the setting of known breast cancer. 2. Additional 6 mm circumscribed rounded lucent lesion anteriorly at the femoral head-neck junction. This is favored to represent a benign synovial herniation pit although an additional tiny metastatic lesion is not excluded. 3. Mild right hip osteoarthritis.  No  fracture or dislocation. 4. Fibroid uterus. Electronically Signed   By: Duanne Guess D.O.   On: 09/29/2023 14:28    PERFORMANCE STATUS (ECOG) : {CHL ONC ECOG VH:[id number]}  Review of Systems Unless otherwise noted, a complete review of systems is negative.  Physical Exam General: NAD Cardiovascular: regular rate and rhythm Pulmonary: clear ant fields Abdomen: soft, nontender, + bowel sounds Extremities: no  edema, no joint deformities Skin: no rashes Neurological: Alert and oriented x3  IMPRESSION: *** I introduced myself, Maygan RN, and Palliative's role in collaboration with the oncology team. Concept of Palliative Care was introduced as specialized medical care for people and their families living with serious illness.  It focuses on providing relief from the symptoms and stress of a serious illness.  The goal is to improve quality of life for both the patient and the family. Values and goals of care important to patient and family were attempted to be elicited.    We discussed *** current illness and what it means in the larger context of *** on-going co-morbidities. Natural disease trajectory and expectations were discussed.  I discussed the importance of continued conversation with family and their medical providers regarding overall plan of care and treatment options, ensuring decisions are within the context of the patients values and GOCs.  PLAN: Established therapeutic relationship. Education provided on palliative's role in collaboration with their Oncology/Radiation team. I will plan to see patient back in 2-4 weeks in collaboration to other oncology appointments.    Patient expressed understanding and was in agreement with this plan. She also understands that She can call the clinic at any time with any questions, concerns, or complaints.   Thank you for your referral and allowing Palliative to assist in Mrs. Kristina Nicholson's care.   Number and complexity of  problems addressed: ***HIGH - 1 or more chronic illnesses with SEVERE exacerbation, progression, or side effects of treatment - advanced cancer, pain. Any controlled substances utilized were prescribed in the context of palliative care.   Visit consisted of counseling and education dealing with the complex and emotionally intense issues of symptom management and palliative care in the setting of serious and potentially life-threatening illness.  Signed by: Willette Alma, AGPCNP-BC Palliative Medicine Team/[facility]   *Please note that this is a verbal dictation therefore any spelling or grammatical errors are due to the "[employer] One" system interpretation.

## 2023-10-14 NOTE — Assessment & Plan Note (Addendum)
[name], stage IIB, ER: Positive, PR: Negative, HER2: Negative, Grade 3, bone metastasis in 05/2023 -She was diagnosed in 04/2017. She is s/p left breast mastectomy and adjuvant radiation. Mammaprint showed low risk. -She started antiestrogen therapy with Tamoxifen in 10/2017. She is tolerating well, plan for 10 year. She has not had a menstrual period since 2018.  Her FSH in March 2021 showed that she is still premenopausal --Due to worsening left shoulder pain, she underwent CT and bone scan in [place] during her trip. Unfortunately scan showed multiple bone lesion and uptake in sternum, cervical spine, left scapular, left 10th rib, and left hip, concerning for bone mets. She underwent bone biopsy of left scapula and sternum, both confirmed metastatic breast cancer, ER positive (weak to moderate in left scapula biopsy, and 80% strong positive in sternal biopsy), PR weak to moderate positive, HER2 2+ in left scapular, 1+ in sternal bone biopsy -PET scan from June 21, 2023 showed multifocal hypermetabolic osseous metastatic disease. No definite pathologic fracture or epidural tumor identified.  No visceral metastasis.   -she started fulvestrant injection and Verzenio on August 13, 2023.  Due to poor tolerance to Verzenio, dose was reduced to 100 mg twice daily -She underwent BSO on August 24, 2023, surgical path was benign. -Due to severe right hip pain from bone metastasis, she underwent right hip replacement on September 30, 2023 -will obtain Guardant360 today, to see if she has targetable mutation. -Due to her disease progression, would likely change her treatment based on Guardant360 result.

## 2023-10-15 ENCOUNTER — Inpatient Hospital Stay: Payer: BC Managed Care – PPO | Attending: Hematology

## 2023-10-15 ENCOUNTER — Encounter: Payer: Self-pay | Admitting: Nurse Practitioner

## 2023-10-15 ENCOUNTER — Encounter: Payer: Self-pay | Admitting: Hematology

## 2023-10-15 ENCOUNTER — Inpatient Hospital Stay: Payer: BC Managed Care – PPO | Attending: Hematology | Admitting: Hematology

## 2023-10-15 ENCOUNTER — Inpatient Hospital Stay (HOSPITAL_BASED_OUTPATIENT_CLINIC_OR_DEPARTMENT_OTHER): Payer: BC Managed Care – PPO | Admitting: Nurse Practitioner

## 2023-10-15 ENCOUNTER — Other Ambulatory Visit (HOSPITAL_COMMUNITY): Payer: Self-pay

## 2023-10-15 VITALS — BP 114/93 | HR 77 | Temp 98.1°F | Resp 18 | Ht 63.0 in | Wt 127.0 lb

## 2023-10-15 DIAGNOSIS — C50212 Malignant neoplasm of upper-inner quadrant of left female breast: Secondary | ICD-10-CM | POA: Diagnosis not present

## 2023-10-15 DIAGNOSIS — Z96641 Presence of right artificial hip joint: Secondary | ICD-10-CM | POA: Diagnosis not present

## 2023-10-15 DIAGNOSIS — Z170 Estrogen receptor positive status [ER+]: Secondary | ICD-10-CM

## 2023-10-15 DIAGNOSIS — K521 Toxic gastroenteritis and colitis: Secondary | ICD-10-CM | POA: Diagnosis not present

## 2023-10-15 DIAGNOSIS — Z79899 Other long term (current) drug therapy: Secondary | ICD-10-CM | POA: Insufficient documentation

## 2023-10-15 DIAGNOSIS — Z923 Personal history of irradiation: Secondary | ICD-10-CM | POA: Insufficient documentation

## 2023-10-15 DIAGNOSIS — T451X5A Adverse effect of antineoplastic and immunosuppressive drugs, initial encounter: Secondary | ICD-10-CM | POA: Insufficient documentation

## 2023-10-15 DIAGNOSIS — C7951 Secondary malignant neoplasm of bone: Secondary | ICD-10-CM | POA: Diagnosis not present

## 2023-10-15 DIAGNOSIS — Z79811 Long term (current) use of aromatase inhibitors: Secondary | ICD-10-CM | POA: Insufficient documentation

## 2023-10-15 DIAGNOSIS — Z515 Encounter for palliative care: Secondary | ICD-10-CM

## 2023-10-15 DIAGNOSIS — R530 Neoplastic (malignant) related fatigue: Secondary | ICD-10-CM

## 2023-10-15 DIAGNOSIS — G893 Neoplasm related pain (acute) (chronic): Secondary | ICD-10-CM

## 2023-10-15 LAB — CBC WITH DIFFERENTIAL (CANCER CENTER ONLY)
Abs Immature Granulocytes: 0.01 10*3/uL (ref 0.00–0.07)
Basophils Absolute: 0.1 10*3/uL (ref 0.0–0.1)
Basophils Relative: 1 %
Eosinophils Absolute: 0.1 10*3/uL (ref 0.0–0.5)
Eosinophils Relative: 2 %
HCT: 28.8 % — ABNORMAL LOW (ref 36.0–46.0)
Hemoglobin: 9.4 g/dL — ABNORMAL LOW (ref 12.0–15.0)
Immature Granulocytes: 0 %
Lymphocytes Relative: 25 %
Lymphs Abs: 1.2 10*3/uL (ref 0.7–4.0)
MCH: 32.9 pg (ref 26.0–34.0)
MCHC: 32.6 g/dL (ref 30.0–36.0)
MCV: 100.7 fL — ABNORMAL HIGH (ref 80.0–100.0)
Monocytes Absolute: 0.5 10*3/uL (ref 0.1–1.0)
Monocytes Relative: 11 %
Neutro Abs: 3 10*3/uL (ref 1.7–7.7)
Neutrophils Relative %: 61 %
Platelet Count: 433 10*3/uL — ABNORMAL HIGH (ref 150–400)
RBC: 2.86 MIL/uL — ABNORMAL LOW (ref 3.87–5.11)
RDW: 15.6 % — ABNORMAL HIGH (ref 11.5–15.5)
WBC Count: 5 10*3/uL (ref 4.0–10.5)
nRBC: 0 % (ref 0.0–0.2)

## 2023-10-15 LAB — CMP (CANCER CENTER ONLY)
ALT: 17 U/L (ref 0–44)
AST: 17 U/L (ref 15–41)
Albumin: 4.1 g/dL (ref 3.5–5.0)
Alkaline Phosphatase: 69 U/L (ref 38–126)
Anion gap: 6 (ref 5–15)
BUN: 12 mg/dL (ref 6–20)
CO2: 29 mmol/L (ref 22–32)
Calcium: 9.2 mg/dL (ref 8.9–10.3)
Chloride: 105 mmol/L (ref 98–111)
Creatinine: 0.61 mg/dL (ref 0.44–1.00)
GFR, Estimated: >60 mL/min (ref >60)
Glucose, Bld: 82 mg/dL (ref 70–99)
Potassium: 3.9 mmol/L (ref 3.5–5.1)
Sodium: 140 mmol/L (ref 135–145)
Total Bilirubin: 0.3 mg/dL (ref <1.2)
Total Protein: 7 g/dL (ref 6.5–8.1)

## 2023-10-15 MED ORDER — RIVAROXABAN 10 MG PO TABS: 10.0000 mg | ORAL_TABLET | Freq: Every day | ORAL | 0 refills | Status: DC

## 2023-10-15 MED ORDER — GABAPENTIN 300 MG PO CAPS
300.0000 mg | ORAL_CAPSULE | Freq: Three times a day (TID) | ORAL | 1 refills | Status: DC
  Filled 2023-10-15: qty 90, 30d supply, fill #0
  Filled 2023-12-24: qty 90, 30d supply, fill #1

## 2023-10-15 NOTE — Progress Notes (Signed)
[facility]   [phone] [phone]   Clinic Follow up Note   Patient Care Team: Patient, No Pcp Per as PCP - General (General Practice) Malachy Mood, MD as Consulting Physician (Hematology) Griselda Miner, MD as Consulting Physician (General Surgery) Lonie Peak, MD as Attending Physician (Radiation Oncology)  Date of Service:  10/15/2023  CHIEF COMPLAINT: f/u of metastatic breast cancer  CURRENT THERAPY:  Letrozole and Verzenio  Oncology History   Malignant neoplasm of upper-inner quadrant of left breast in female, estrogen receptor positive (HCC) [name], stage IIB, ER: Positive, PR: Negative, HER2: Negative, Grade 3, bone metastasis in 05/2023 -She was diagnosed in 04/2017. She is s/p left breast mastectomy and adjuvant radiation. Mammaprint showed low risk. -She started antiestrogen therapy with Tamoxifen in 10/2017. She is tolerating well, plan for 10 year. She has not had a menstrual period since 2018.  Her FSH in March 2021 showed that she is still premenopausal --Due to worsening left shoulder pain, she underwent CT and bone scan in [place] during her trip. Unfortunately scan showed multiple bone lesion and uptake in sternum, cervical spine, left scapular, left 10th rib, and left hip, concerning for bone mets. She underwent bone biopsy of left scapula and sternum, both confirmed metastatic breast cancer, ER positive (weak to moderate in left scapula biopsy, and 80% strong positive in sternal biopsy), PR weak to moderate positive, HER2 2+ in left scapular, 1+ in sternal bone biopsy -PET scan from June 21, 2023 showed multifocal hypermetabolic osseous metastatic disease. No definite pathologic fracture or epidural tumor identified.  No visceral metastasis.   -she started fulvestrant injection and Verzenio on August 13, 2023.  Due to poor tolerance to Verzenio, dose was reduced to 100 mg twice daily -She underwent BSO on August 24, 2023, surgical  path was benign. -Due to severe right hip pain from bone metastasis, she underwent right hip replacement on September 30, 2023 -will obtain Guardant360 today, to see if she has targetable mutation. -Due to her disease progression, would likely change her treatment based on Guardant360 result.    Assessment and Plan    Metastatic Breast Cancer 57 year old with metastatic breast cancer experiencing significant pain, particularly in the shoulder, affecting sleep. Pharmacy provided only a 14-day supply of pain medication instead of the prescribed 21 days. One dose left. On anticoagulation therapy. Using lidocaine patches, which help but are not covered by insurance. Scheduled for a PET scan in two weeks. Upcoming appointment with palliative care NP to discuss pain management. Advised to wait for surgical wound to heal before restarting Verzenio. Discussed Cymbalta or gabapentin for neuropathic pain. Informed about Guardian 360 liquid biopsy for circulating tumor DNA mutations. - Continue anticoagulation therapy for one more week  - Use lidocaine patches as needed - Discuss pain management with palliative care NP - Scheduled PET scan in two weeks - Draw Guardian 360 liquid biopsy today - Schedule phone visit on October 30, 2023 - Restart Verzenio once surgical wound is completely healed  Pain Management Significant shoulder pain affecting sleep. Using lidocaine patches and heating pad. Considering Cymbalta or gabapentin for neuropathic pain. Discussed benefits and potential side effects. - Discuss pain management options with palliative care NP - Consider Cymbalta or gabapentin for neuropathic pain - Continue using heating pad for pain relief  Post-Surgical Wound Care Surgical wound still healing. Scheduled to see the surgeon on Wednesday to assess healing. Advised to wait for complete healing before restarting Verzenio. - Assess wound healing during  upcoming surgeon visit - Restart Verzenio  once wound is confirmed to be completely healed  Plan -Continue letrozole -She has follow-up with orthopedics later this week, if her wound is completely healed, she will restart Verzenio - Schedule phone visit on October 30, 2023 - Draw Guardian 360 liquid biopsy today -She is scheduled for PET scan in two weeks -will request ER/PR/HER2 and NGS Carris on her recent surgical sample        SUMMARY OF ONCOLOGIC HISTORY: Oncology History Overview Note  Cancer Staging Malignant neoplasm of upper-inner quadrant of left breast in female, estrogen receptor positive (HCC) Staging form: Breast, AJCC 8th Edition - Clinical stage from 04/13/2017: Stage IIA (cT2, cN0, cM0, G2, ER: Positive, PR: Negative, HER2: Negative) - Signed by Malachy Mood, MD on 04/25/2017 - Pathologic stage from 06/13/2017: Stage IIB (pT2, pN1a, cM0, G1, ER: Positive, PR: Negative, HER2: Negative) - Signed by Malachy Mood, MD on 09/04/2017     Malignant neoplasm of upper-inner quadrant of left breast in female, estrogen receptor positive (HCC)  04/10/2017 Mammogram   Left breast mammo and US showed an irregular hypoechoic shadowing mass at 9 o'clock 1 cm from the nipple measuring 2.3 x 1 x 2.4 cm. This corresponds well with the mass seen at mammography. No lymphadenopathy seen in the left axilla.   04/18/2017 Initial Diagnosis   Malignant neoplasm of upper-inner quadrant of left breast in female, estrogen receptor positive (HCC)   04/22/2017 Receptors her2   Estrogen Receptor: 95%, POSITIVE, STRONG STAINING INTENSITY Progesterone Receptor: 0%, NEGATIVE Proliferation Marker Ki67: 10% HER2 -   04/22/2017 Initial Biopsy   Breast, left, needle core biopsy, 9:30 o'clock - INVASIVE DUCTAL CARCINOMA, G2 - DUCTAL CARCINOMA IN SITU.   06/13/2017 Surgery   Left breast mastectomy with sentinel lymph node biopsy performed by Dr Carolynne Edouard.    06/13/2017 Pathology Results   Breast, simple mastectomy, Left - INVASIVE DUCTAL CARCINOMA, GRADE  I/III, SPANNING 2.5 CM. - DUCTAL CARCINOMA IN SITU, INTERMEDIATE GRADE. - LOBULAR NEOPLASIA (ATYPICAL LOBULAR HYPERPLASIA). - THE SURGICAL RESECTION MARGINS ARE NEGATIVE FOR CARCINOMA.  Lymph node, sentinel, biopsy, Left axillary #1 - METASTATIC CARCINOMA IN 1 OF 3 LYMPH NODE (1/3), WITH EXTRACAPSULAR EXTENSION.   06/13/2017 Pathology Results   Mammaprint with low risk disease.    07/31/2017 - 09/10/2017 Radiation Therapy   The patient began a course of radiotherapy over the left chest wall and axillary area, supervised by Dr Lonie Peak, MD.     Radiation treatment dates:   07/31/2017 - 09/10/2017   Site/dose:    1) Left Chest Wall / 50 Gy in 25 fractions 2) Left Supraclavicular / 45 Gy in 25 fractions 3) Left Chest Wall Scar Boost / 10 Gy in 5 fractions   Beams/energy:    1) Opposed tangents 3D/ 10 and 6 MV photons 2) Additional fields 3D / 10 MV and 6 MV photons 3) En face electrons / 6 MeV electrons   Narrative: The patient tolerated radiation treatment relatively well. She experienced mild fatigue. She reported pain, burning, and itching to the left breast and swelling under the left arm. On physical exam, she had dry peeling in the left UIQ of the breast and left axilla, as well as diffuse hyperpigmentation and erythema of the left chest wall. She is using the radiaplex cream to the treatment area and neosporin to the peeling areas.   10/13/2017 -  Anti-estrogen oral therapy   Tamoxifen 10mg  daily   04/11/2018 Mammogram   04/11/2018 Mammogram IMPRESSION:  No mammographic evidence of malignancy. A result letter of this screening mammogram will be mailed directly to the patient.   04/11/2018 Mammogram   Screening Unilateral Right: No evidence of mammographic malignancy.       Discussed the use of AI scribe software for clinical note transcription with the patient, who gave verbal consent to proceed.  History of Present Illness   The patient, a 58 year old with metastatic  breast cancer, presents with significant pain. Despite being prescribed pain medication, the patient reports that the pain persists. The patient's husband mentions that the pharmacy only provided a 14-day supply of the medication, despite the prescription being for 21 days. The patient has one dose left. The patient is also using pain patches, which provide some relief, but are costly and not covered by insurance. The patient is able to move around at home and reports sleeping well. The patient also mentions shoulder pain, which is exacerbated by certain sleeping positions. The patient has been using a heating pad for relief. The patient's surgical wound is healing, but not completely healed yet.         All other systems were reviewed with the patient and are negative.  MEDICAL HISTORY:  Past Medical History:  Diagnosis Date   Chronic left shoulder pain    Headache    History of external beam radiation therapy    07-31-2017  to  09-10-2017  LEft chest wass 50 gy in 25 fractions, Left Supraclavicular 45 Gy in 25 fractions, Left Chest wall scar boost 10 Gy in 5 fractions.   Horseshoe kidney    Malignant neoplasm of upper-inner quadrant of left breast in female, estrogen receptor positive (HCC) 04/18/2017   oncologist--- dr Mosetta Putt;   dx 06/ 2018 ,  Stage IIB, ER/PR+, G3, IDC/ DCIS;  06-13-2017 s/p left mastectomy w/ node dissection ;  completed radiation 09-10-2017;   recurrence w/ mets to bone 07/ 2024 , started chemotherapy   Metastatic cancer to bone (HCC) 05/2023   Uterine fibroid     SURGICAL HISTORY: Past Surgical History:  Procedure Laterality Date   APPENDECTOMY  2005   COLONOSCOPY  2016   GANGLION CYST EXCISION Right 09/24/2017   Procedure: EXCISION OF RIGHT DORSAL WRIST GANGLION;  Surgeon: Mack Hook, MD;  Location: [facility];  Service: Orthopedics;  Laterality: Right;   LAPAROSCOPIC SALPINGO OOPHERECTOMY Bilateral 08/24/2023   Procedure: LAPAROSCOPIC SALPINGO  OOPHORECTOMY;  Surgeon: [name], DO;  Location: [facility];  Service: Gynecology;  Laterality: Bilateral;   LIPOMA EXCISION Left 06/13/2017   Procedure: EXCISION 3CM LIPOMA ON LEFT ARM;  Surgeon: Griselda Miner, MD;  Location: [facility] OR;  Service: General;  Laterality: Left;   MASTECTOMY W/ SENTINEL NODE BIOPSY Left 06/13/2017   Procedure: MASTECTOMY WITH SENTINEL LYMPH NODE BIOPSY;  Surgeon: Griselda Miner, MD;  Location: [facility] OR;  Service: General;  Laterality: Left;   TOTAL HIP ARTHROPLASTY Right 09/30/2023   Procedure: TOTAL HIP ARTHROPLASTY ANTERIOR APPROACH;  Surgeon: Durene Romans, MD;  Location: [facility];  Service: Orthopedics;  Laterality: Right;    I have reviewed the social history and family history with the patient and they are unchanged from previous note.  ALLERGIES:  has No Known Allergies.  MEDICATIONS:  Current Outpatient Medications  Medication Sig Dispense Refill   acetaminophen (TYLENOL) 500 MG tablet Take 2 tablets (1,000 mg total) by mouth every 6 (six) hours as needed. 30 tablet 0   [START ON 10/24/2023] aspirin EC 81 MG tablet  Take 1 tablet (81 mg total) by mouth in the morning and at bedtime for 21 days. Swallow whole. 42 tablet 0   ferrous sulfate 325 (65 FE) MG tablet Take 1 tablet (325 mg total) by mouth daily with breakfast. 30 tablet 3   gabapentin (NEURONTIN) 300 MG capsule Take 1 capsule (300 mg total) by mouth 3 (three) times daily. 90 capsule 1   letrozole (FEMARA) 2.5 MG tablet Take 1 tablet (2.5 mg total) by mouth daily. (Patient not taking: Reported on 09/29/2023) 30 tablet 1   lidocaine (LIDODERM) 5 % Place 1 patch onto the skin daily. Remove & Discard patch within 12 hours or as directed by MD 30 patch 0   loperamide (IMODIUM A-D) 2 MG tablet Take 2 mg by mouth 4 (four) times daily as needed for diarrhea or loose stools.     Multiple Vitamins-Minerals (MULTIVITAMIN WOMEN PO) Take 1 tablet by mouth daily with breakfast.     Nutritional  Supplements (CHLORELLA-SPIRULINA COMPLEX PO) Take 1 capsule by mouth daily as needed (to aid digestive health).     polyethylene glycol (MIRALAX / GLYCOLAX) 17 g packet Take 17 g by mouth 2 (two) times daily. 14 each 0   rivaroxaban (XARELTO) 10 MG TABS tablet Take 1 tablet (10 mg total) by mouth daily for 21 days. 7 tablet 0   senna (SENOKOT) 8.6 MG TABS tablet Take 2 tablets (17.2 mg total) by mouth at bedtime. 120 tablet 0   No current facility-administered medications for this visit.    PHYSICAL EXAMINATION: ECOG PERFORMANCE STATUS: 2 - Symptomatic, <50% confined to bed  Vitals:   10/15/23 1335  BP: (!) 114/93  Pulse: 77  Resp: 18  Temp: 98.1 F (36.7 C)  SpO2: 97%   Wt Readings from Last 3 Encounters:  10/15/23 127 lb (57.6 kg)  09/29/23 122 lb (55.3 kg)  09/07/23 122 lb 3.2 oz (55.4 kg)     GENERAL:alert, no distress and comfortable SKIN: skin color, texture, turgor are normal, no rashes or significant lesions EYES: normal, Conjunctiva are pink and non-injected, sclera clear NECK: supple, thyroid normal size, non-tender, without nodularity LYMPH:  no palpable lymphadenopathy in the cervical, axillary  LUNGS: clear to auscultation and percussion with normal breathing effort HEART: regular rate & rhythm and no murmurs and no lower extremity edema ABDOMEN:abdomen soft, non-tender and normal bowel sounds Musculoskeletal:no cyanosis of digits and no clubbing  NEURO: alert & oriented x 3 with fluent speech, no focal motor/sensory deficits    LABORATORY DATA:  I have reviewed the data as listed    Latest Ref Rng & Units 10/15/2023    3:15 PM 10/02/2023    3:50 AM 10/01/2023    3:46 AM  CBC  WBC 4.0 - 10.5 K/uL 5.0  5.5  6.2   Hemoglobin 12.0 - 15.0 g/dL 9.4  8.3  9.3   Hematocrit 36.0 - 46.0 % 28.8  24.7  27.3   Platelets 150 - 400 K/uL 433  167  180         Latest Ref Rng & Units 10/15/2023    3:15 PM 10/01/2023    3:46 AM 09/30/2023    3:55 AM  CMP  Glucose  70 - 99 mg/dL 82  981  85   BUN 6 - 20 mg/dL 12  15  17    Creatinine 0.44 - 1.00 mg/dL 1.91  4.78  2.95   Sodium 135 - 145 mmol/L 140  137  138   Potassium 3.5 -  5.1 mmol/L 3.9  3.7  3.7   Chloride 98 - 111 mmol/L 105  106  106   CO2 22 - 32 mmol/L 29  23  25    Calcium 8.9 - 10.3 mg/dL 9.2  8.5  8.7   Total Protein 6.5 - 8.1 g/dL 7.0   6.5   Total Bilirubin <1.2 mg/dL 0.3   0.6   Alkaline Phos 38 - 126 U/L 69   44   AST 15 - 41 U/L 17   17   ALT 0 - 44 U/L 17   19       RADIOGRAPHIC STUDIES: I have personally reviewed the radiological images as listed and agreed with the findings in the report. No results found.    No orders of the defined types were placed in this encounter.  All questions were answered. The patient knows to call the clinic with any problems, questions or concerns. No barriers to learning was detected. The total time spent in the appointment was 30 minutes.     Malachy Mood, MD 10/15/2023

## 2023-10-16 ENCOUNTER — Encounter: Payer: Self-pay | Admitting: Hematology

## 2023-10-16 ENCOUNTER — Other Ambulatory Visit: Payer: Self-pay

## 2023-10-16 LAB — CANCER ANTIGEN 27.29: CA 27.29: 39.8 U/mL — ABNORMAL HIGH (ref 0.0–38.6)

## 2023-10-16 NOTE — Progress Notes (Signed)
Per email from Dr. Mosetta Putt, ordered Caris on Case#[id number].  Pt will have lab drawn sometime this week.  Awaiting confirmation from pt to schedule appt for lab.

## 2023-10-17 ENCOUNTER — Other Ambulatory Visit: Payer: Self-pay

## 2023-10-17 ENCOUNTER — Inpatient Hospital Stay: Payer: BC Managed Care – PPO

## 2023-10-17 DIAGNOSIS — C50212 Malignant neoplasm of upper-inner quadrant of left female breast: Secondary | ICD-10-CM

## 2023-10-17 DIAGNOSIS — C7951 Secondary malignant neoplasm of bone: Secondary | ICD-10-CM

## 2023-10-17 DIAGNOSIS — C50312 Malignant neoplasm of lower-inner quadrant of left female breast: Secondary | ICD-10-CM

## 2023-10-17 LAB — MISCELLANEOUS TEST

## 2023-10-24 DIAGNOSIS — C50212 Malignant neoplasm of upper-inner quadrant of left female breast: Secondary | ICD-10-CM | POA: Diagnosis not present

## 2023-10-25 ENCOUNTER — Encounter: Payer: Self-pay | Admitting: Hematology

## 2023-10-25 DIAGNOSIS — C7951 Secondary malignant neoplasm of bone: Secondary | ICD-10-CM | POA: Diagnosis not present

## 2023-10-25 DIAGNOSIS — C50212 Malignant neoplasm of upper-inner quadrant of left female breast: Secondary | ICD-10-CM | POA: Diagnosis not present

## 2023-10-26 ENCOUNTER — Encounter: Payer: Self-pay | Admitting: Hematology

## 2023-10-29 ENCOUNTER — Ambulatory Visit (HOSPITAL_COMMUNITY): Payer: BC Managed Care – PPO

## 2023-10-29 LAB — GUARDANT 360

## 2023-10-29 NOTE — Assessment & Plan Note (Signed)
 [name], stage IIB, ER: Positive, PR: Negative, HER2: Negative, Grade 3, bone metastasis in 05/2023 -She was diagnosed in 04/2017. She is s/p left breast mastectomy and adjuvant radiation. Mammaprint showed low risk. -She started antiestrogen therapy with Tamoxifen in 10/2017. She is tolerating well, plan for 10 year. She has not had a menstrual period since 2018.  Her FSH in March 2021 showed that she is still premenopausal --Due to worsening left shoulder pain, she underwent CT and bone scan in [place] during her trip. Unfortunately scan showed multiple bone lesion and uptake in sternum, cervical spine, left scapular, left 10th rib, and left hip, concerning for bone mets. She underwent bone biopsy of left scapula and sternum, both confirmed metastatic breast cancer, ER positive (weak to moderate in left scapula biopsy, and 80% strong positive in sternal biopsy), PR weak to moderate positive, HER2 2+ in left scapular, 1+ in sternal bone biopsy -PET scan from June 21, 2023 showed multifocal hypermetabolic osseous metastatic disease. No definite pathologic fracture or epidural tumor identified.  No visceral metastasis.   -she started fulvestrant injection and Verzenio on August 13, 2023.  Due to poor tolerance to Verzenio, dose was reduced to 100 mg twice daily -She underwent BSO on August 24, 2023, surgical path was benign. -Due to severe right hip pain from bone metastasis, she underwent right hip replacement on September 30, 2023 -will obtain Guardant360 today, to see if she has targetable mutation. -Due to her disease progression, would likely change her treatment based on Guardant360 result.

## 2023-10-30 ENCOUNTER — Inpatient Hospital Stay (HOSPITAL_BASED_OUTPATIENT_CLINIC_OR_DEPARTMENT_OTHER): Payer: BC Managed Care – PPO | Admitting: Hematology

## 2023-10-30 ENCOUNTER — Encounter: Payer: Self-pay | Admitting: Hematology

## 2023-10-30 DIAGNOSIS — Z923 Personal history of irradiation: Secondary | ICD-10-CM | POA: Diagnosis not present

## 2023-10-30 DIAGNOSIS — C50212 Malignant neoplasm of upper-inner quadrant of left female breast: Secondary | ICD-10-CM

## 2023-10-30 DIAGNOSIS — K521 Toxic gastroenteritis and colitis: Secondary | ICD-10-CM | POA: Diagnosis not present

## 2023-10-30 DIAGNOSIS — T451X5A Adverse effect of antineoplastic and immunosuppressive drugs, initial encounter: Secondary | ICD-10-CM | POA: Diagnosis not present

## 2023-10-30 DIAGNOSIS — Z79899 Other long term (current) drug therapy: Secondary | ICD-10-CM | POA: Diagnosis not present

## 2023-10-30 DIAGNOSIS — Z170 Estrogen receptor positive status [ER+]: Secondary | ICD-10-CM

## 2023-10-30 DIAGNOSIS — C7951 Secondary malignant neoplasm of bone: Secondary | ICD-10-CM | POA: Diagnosis not present

## 2023-10-30 DIAGNOSIS — Z79811 Long term (current) use of aromatase inhibitors: Secondary | ICD-10-CM | POA: Diagnosis not present

## 2023-10-30 NOTE — Progress Notes (Signed)
[facility]   Telephone:(336) [phone] Fax:(336) [phone]   Clinic Follow up Note   Patient Care Team: Patient, No Pcp Per as PCP - General (General Practice) Malachy Mood, MD as Consulting Physician (Hematology) Griselda Miner, MD as Consulting Physician (General Surgery) Lonie Peak, MD as Attending Physician (Radiation Oncology) Glee Arvin, NP as Nurse Practitioner (Hospice and Palliative Medicine) 10/30/2023  I connected with Kristina Nicholson on 10/30/23 at  4:00 PM EST by telephone and verified that I am speaking with the correct person using two identifiers.   I discussed the limitations, risks, security and privacy concerns of performing an evaluation and management service by telephone and the availability of in person appointments. I also discussed with the patient that there may be a patient responsible charge related to this service. The patient expressed understanding and agreed to proceed.   Patient's location:  Home  Provider's location:  Office    CHIEF COMPLAINT: Follow-up of breast cancer   CURRENT THERAPY: Letrozole and Verzenio  Oncology history Malignant neoplasm of upper-inner quadrant of left breast in female, estrogen receptor positive (HCC) [name], stage IIB, ER: Positive, PR: Negative, HER2: Negative, Grade 3, bone metastasis in 05/2023 -She was diagnosed in 04/2017. She is s/p left breast mastectomy and adjuvant radiation. Mammaprint showed low risk. -She started antiestrogen therapy with Tamoxifen in 10/2017. She is tolerating well, plan for 10 year. She has not had a menstrual period since 2018.  Her FSH in March 2021 showed that she is still premenopausal --Due to worsening left shoulder pain, she underwent CT and bone scan in [place] during her trip. Unfortunately scan showed multiple bone lesion and uptake in sternum, cervical spine, left scapular, left 10th rib, and left hip, concerning for bone mets. She underwent bone  biopsy of left scapula and sternum, both confirmed metastatic breast cancer, ER positive (weak to moderate in left scapula biopsy, and 80% strong positive in sternal biopsy), PR weak to moderate positive, HER2 2+ in left scapular, 1+ in sternal bone biopsy -PET scan from June 21, 2023 showed multifocal hypermetabolic osseous metastatic disease. No definite pathologic fracture or epidural tumor identified.  No visceral metastasis.   -she started fulvestrant injection and Verzenio on August 13, 2023.  Due to poor tolerance to Verzenio, dose was reduced to 100 mg twice daily -She underwent BSO on August 24, 2023, surgical path was benign. -Due to severe right hip pain from bone metastasis, she underwent right hip replacement on September 30, 2023 -will obtain Guardant360 today, to see if she has targetable mutation. -Due to her disease progression, would likely change her treatment based on Guardant360 result.   Assessment and Plan    Metastatic Breast Cancer 57 year old with metastatic breast cancer Reports leg pain managed with Tylenol and chemotherapy-induced diarrhea. Awaiting PET scan post-Christmas to assess treatment efficacy. Tumor marker CA 27.29 slightly lower. PIK3CA and MUTYH mutations present, referral to genetic counselor recommended. - Use half dose Imodium if diarrhea exceeds three to four times a day - Ensure adequate hydration and electrolyte replacement with liquid IV or similar supplements - Order PET scan after Christmas to assess treatment efficacy - Follow-up appointment on January 3rd to review PET scan results - Check with pharmacy if spirulina supplement is safe to use  Genetic Mutation (PIK3CA and MUTYH) -somatic PIK3CA mutation may be targeted with FDA-approved oral drugs if disease progresses. -germline MUTYH mutation increases risk for colon cancer and polyps. Referral to genetic counselor recommended. - Consider future  treatment with oral drugs targeting PIK3CA  mutation if disease progresses - Refer to genetic counselor for discussion on MUTYH mutation and associated risks  General Health Maintenance Discussed importance of hydration and electrolyte balance due to chemotherapy-induced diarrhea. - Ensure adequate hydration and electrolyte replacement - Monitor for signs of dehydration and electrolyte imbalance  Follow-up - Follow-up appointment on January 3rd to review PET scan results and discuss treatment plan.    -will refer her to genetic next time if she agrees      SUMMARY OF ONCOLOGIC HISTORY: Oncology History Overview Note  Cancer Staging Malignant neoplasm of upper-inner quadrant of left breast in female, estrogen receptor positive (HCC) Staging form: Breast, AJCC 8th Edition - Clinical stage from 04/13/2017: Stage IIA (cT2, cN0, cM0, G2, ER: Positive, PR: Negative, HER2: Negative) - Signed by Malachy Mood, MD on 04/25/2017 - Pathologic stage from 06/13/2017: Stage IIB (pT2, pN1a, cM0, G1, ER: Positive, PR: Negative, HER2: Negative) - Signed by Malachy Mood, MD on 09/04/2017     Malignant neoplasm of upper-inner quadrant of left breast in female, estrogen receptor positive (HCC)  04/10/2017 Mammogram   Left breast mammo and US showed an irregular hypoechoic shadowing mass at 9 o'clock 1 cm from the nipple measuring 2.3 x 1 x 2.4 cm. This corresponds well with the mass seen at mammography. No lymphadenopathy seen in the left axilla.   04/18/2017 Initial Diagnosis   Malignant neoplasm of upper-inner quadrant of left breast in female, estrogen receptor positive (HCC)   04/22/2017 Receptors her2   Estrogen Receptor: 95%, POSITIVE, STRONG STAINING INTENSITY Progesterone Receptor: 0%, NEGATIVE Proliferation Marker Ki67: 10% HER2 -   04/22/2017 Initial Biopsy   Breast, left, needle core biopsy, 9:30 o'clock - INVASIVE DUCTAL CARCINOMA, G2 - DUCTAL CARCINOMA IN SITU.   06/13/2017 Surgery   Left breast mastectomy with sentinel lymph node biopsy  performed by Dr Carolynne Edouard.    06/13/2017 Pathology Results   Breast, simple mastectomy, Left - INVASIVE DUCTAL CARCINOMA, GRADE I/III, SPANNING 2.5 CM. - DUCTAL CARCINOMA IN SITU, INTERMEDIATE GRADE. - LOBULAR NEOPLASIA (ATYPICAL LOBULAR HYPERPLASIA). - THE SURGICAL RESECTION MARGINS ARE NEGATIVE FOR CARCINOMA.  Lymph node, sentinel, biopsy, Left axillary #1 - METASTATIC CARCINOMA IN 1 OF 3 LYMPH NODE (1/3), WITH EXTRACAPSULAR EXTENSION.   06/13/2017 Pathology Results   Mammaprint with low risk disease.    07/31/2017 - 09/10/2017 Radiation Therapy   The patient began a course of radiotherapy over the left chest wall and axillary area, supervised by Dr Lonie Peak, MD.     Radiation treatment dates:   07/31/2017 - 09/10/2017   Site/dose:    1) Left Chest Wall / 50 Gy in 25 fractions 2) Left Supraclavicular / 45 Gy in 25 fractions 3) Left Chest Wall Scar Boost / 10 Gy in 5 fractions   Beams/energy:    1) Opposed tangents 3D/ 10 and 6 MV photons 2) Additional fields 3D / 10 MV and 6 MV photons 3) En face electrons / 6 MeV electrons   Narrative: The patient tolerated radiation treatment relatively well. She experienced mild fatigue. She reported pain, burning, and itching to the left breast and swelling under the left arm. On physical exam, she had dry peeling in the left UIQ of the breast and left axilla, as well as diffuse hyperpigmentation and erythema of the left chest wall. She is using the radiaplex cream to the treatment area and neosporin to the peeling areas.   10/13/2017 -  Anti-estrogen oral therapy   Tamoxifen  10mg  daily   04/11/2018 Mammogram   04/11/2018 Mammogram IMPRESSION: No mammographic evidence of malignancy. A result letter of this screening mammogram will be mailed directly to the patient.   04/11/2018 Mammogram   Screening Unilateral Right: No evidence of mammographic malignancy.      Discussed the use of AI scribe software for clinical note transcription with the  patient, who gave verbal consent to proceed.  History of Present Illness   The patient is a female with a history of metastatic breast cancer who recently underwent surgery. Post-operatively, she reports persistent pain and weakness, particularly in her legs, which she describes as a sore, aching sensation. This discomfort is manageable during the day, but she requires pain medication at night for relief. She also reports experiencing diarrhea, which occurs three to four times a day. This symptom is suspected to be a side effect of her current medication regimen, which includes letrozole and Verzenio. Despite these symptoms, the patient is able to ambulate with the assistance of a walker and remains active around her home.         REVIEW OF SYSTEMS:   Constitutional: Denies fevers, chills or abnormal weight loss Eyes: Denies blurriness of vision Ears, nose, mouth, throat, and face: Denies mucositis or sore throat Respiratory: Denies cough, dyspnea or wheezes Cardiovascular: Denies palpitation, chest discomfort or lower extremity swelling Gastrointestinal:  Denies nausea, heartburn or change in bowel habits Skin: Denies abnormal skin rashes Lymphatics: Denies new lymphadenopathy or easy bruising Neurological:Denies numbness, tingling or new weaknesses Behavioral/Psych: Mood is stable, no new changes  All other systems were reviewed with the patient and are negative.  MEDICAL HISTORY:  Past Medical History:  Diagnosis Date   Chronic left shoulder pain    Headache    History of external beam radiation therapy    07-31-2017  to  09-10-2017  LEft chest wass 50 gy in 25 fractions, Left Supraclavicular 45 Gy in 25 fractions, Left Chest wall scar boost 10 Gy in 5 fractions.   Horseshoe kidney    Malignant neoplasm of upper-inner quadrant of left breast in female, estrogen receptor positive (HCC) 04/18/2017   oncologist--- dr Mosetta Putt;   dx 06/ 2018 ,  Stage IIB, ER/PR+, G3, IDC/ DCIS;  06-13-2017  s/p left mastectomy w/ node dissection ;  completed radiation 09-10-2017;   recurrence w/ mets to bone 07/ 2024 , started chemotherapy   Metastatic cancer to bone (HCC) 05/2023   Uterine fibroid     SURGICAL HISTORY: Past Surgical History:  Procedure Laterality Date   APPENDECTOMY  2005   COLONOSCOPY  2016   GANGLION CYST EXCISION Right 09/24/2017   Procedure: EXCISION OF RIGHT DORSAL WRIST GANGLION;  Surgeon: Mack Hook, MD;  Location: [facility];  Service: Orthopedics;  Laterality: Right;   LAPAROSCOPIC SALPINGO OOPHERECTOMY Bilateral 08/24/2023   Procedure: LAPAROSCOPIC SALPINGO OOPHORECTOMY;  Surgeon: [name], DO;  Location: [facility];  Service: Gynecology;  Laterality: Bilateral;   LIPOMA EXCISION Left 06/13/2017   Procedure: EXCISION 3CM LIPOMA ON LEFT ARM;  Surgeon: Griselda Miner, MD;  Location: [facility] OR;  Service: General;  Laterality: Left;   MASTECTOMY W/ SENTINEL NODE BIOPSY Left 06/13/2017   Procedure: MASTECTOMY WITH SENTINEL LYMPH NODE BIOPSY;  Surgeon: Griselda Miner, MD;  Location: [facility] OR;  Service: General;  Laterality: Left;   TOTAL HIP ARTHROPLASTY Right 09/30/2023   Procedure: TOTAL HIP ARTHROPLASTY ANTERIOR APPROACH;  Surgeon: Durene Romans, MD;  Location: [facility];  Service: Orthopedics;  Laterality: Right;    I have reviewed the social history and family history with the patient and they are unchanged from previous note.  ALLERGIES:  has no known allergies.  MEDICATIONS:  Current Outpatient Medications  Medication Sig Dispense Refill   acetaminophen (TYLENOL) 500 MG tablet Take 2 tablets (1,000 mg total) by mouth every 6 (six) hours as needed. 30 tablet 0   aspirin EC 81 MG tablet Take 1 tablet (81 mg total) by mouth in the morning and at bedtime for 21 days. Swallow whole. 42 tablet 0   ferrous sulfate 325 (65 FE) MG tablet Take 1 tablet (325 mg total) by mouth daily with breakfast. 30 tablet 3   gabapentin (NEURONTIN) 300  MG capsule Take 1 capsule (300 mg total) by mouth 3 (three) times daily. 90 capsule 1   letrozole (FEMARA) 2.5 MG tablet Take 1 tablet (2.5 mg total) by mouth daily. (Patient not taking: Reported on 09/29/2023) 30 tablet 1   lidocaine (LIDODERM) 5 % Place 1 patch onto the skin daily. Remove & Discard patch within 12 hours or as directed by MD 30 patch 0   loperamide (IMODIUM A-D) 2 MG tablet Take 2 mg by mouth 4 (four) times daily as needed for diarrhea or loose stools.     Multiple Vitamins-Minerals (MULTIVITAMIN WOMEN PO) Take 1 tablet by mouth daily with breakfast.     Nutritional Supplements (CHLORELLA-SPIRULINA COMPLEX PO) Take 1 capsule by mouth daily as needed (to aid digestive health).     polyethylene glycol (MIRALAX / GLYCOLAX) 17 g packet Take 17 g by mouth 2 (two) times daily. 14 each 0   rivaroxaban (XARELTO) 10 MG TABS tablet Take 1 tablet (10 mg total) by mouth daily for 21 days. 7 tablet 0   senna (SENOKOT) 8.6 MG TABS tablet Take 2 tablets (17.2 mg total) by mouth at bedtime. 120 tablet 0   No current facility-administered medications for this visit.    PHYSICAL EXAMINATION: Not performed   LABORATORY DATA:  I have reviewed the data as listed    Latest Ref Rng & Units 10/15/2023    3:15 PM 10/02/2023    3:50 AM 10/01/2023    3:46 AM  CBC  WBC 4.0 - 10.5 K/uL 5.0  5.5  6.2   Hemoglobin 12.0 - 15.0 g/dL 9.4  8.3  9.3   Hematocrit 36.0 - 46.0 % 28.8  24.7  27.3   Platelets 150 - 400 K/uL 433  167  180         Latest Ref Rng & Units 10/15/2023    3:15 PM 10/01/2023    3:46 AM 09/30/2023    3:55 AM  CMP  Glucose 70 - 99 mg/dL 82  629  85   BUN 6 - 20 mg/dL 12  15  17    Creatinine 0.44 - 1.00 mg/dL 5.28  4.13  2.44   Sodium 135 - 145 mmol/L 140  137  138   Potassium 3.5 - 5.1 mmol/L 3.9  3.7  3.7   Chloride 98 - 111 mmol/L 105  106  106   CO2 22 - 32 mmol/L 29  23  25    Calcium 8.9 - 10.3 mg/dL 9.2  8.5  8.7   Total Protein 6.5 - 8.1 g/dL 7.0   6.5   Total  Bilirubin <1.2 mg/dL 0.3   0.6   Alkaline Phos 38 - 126 U/L 69   44   AST 15 - 41 U/L 17  17   ALT 0 - 44 U/L 17   19       RADIOGRAPHIC STUDIES: I have personally reviewed the radiological images as listed and agreed with the findings in the report. No results found.     I discussed the assessment and treatment plan with the patient. The patient was provided an opportunity to ask questions and all were answered. The patient agreed with the plan and demonstrated an understanding of the instructions.   The patient was advised to call back or seek an in-person evaluation if the symptoms worsen or if the condition fails to improve as anticipated.  I provided 21 minutes of non face-to-face telephone visit time during this encounter, and > 50% was spent counseling as documented under my assessment & plan.     Malachy Mood, MD 10/30/23

## 2023-10-31 ENCOUNTER — Encounter: Payer: Self-pay | Admitting: Hematology

## 2023-11-01 ENCOUNTER — Ambulatory Visit
Admission: RE | Admit: 2023-11-01 | Discharge: 2023-11-01 | Disposition: A | Discharge status: Home / Self Care | Payer: BC Managed Care – PPO | Source: Ambulatory Visit | Attending: Radiation Oncology | Admitting: Radiation Oncology

## 2023-11-01 DIAGNOSIS — C50212 Malignant neoplasm of upper-inner quadrant of left female breast: Secondary | ICD-10-CM

## 2023-11-01 NOTE — Progress Notes (Addendum)
Kristina Nicholson is here today for follow up post radiation to the breast.   Breast Side:Left  They completed their radiation on: 09/28/2023   Does the patient complain of any of the following: Post radiation skin issues:  left shoulder itchy skin advise to use hydrocortisone to shoulder. Breast Tenderness: No Breast Swelling: No Lymphadema: No Range of Motion limitations: left arm/shoulder hurts when arm stretching from behind. Fatigue post radiation: No Appetite good/fair/poor: good  Additional comments if applicable: Emergency right hip replacement. On 09/29/2023.  Reports under left arm feels like something thick to area but doesn't hurt to touch.  Denies redness, tenderness, warmth to the area.  Advised patient if treated sited should have any redness, tenderness, or warmth to get to the ED.

## 2023-11-05 ENCOUNTER — Ambulatory Visit: Payer: BC Managed Care – PPO | Admitting: Hematology

## 2023-11-05 ENCOUNTER — Other Ambulatory Visit: Payer: BC Managed Care – PPO

## 2023-11-08 ENCOUNTER — Encounter (HOSPITAL_COMMUNITY)
Admission: RE | Admit: 2023-11-08 | Discharge: 2023-11-08 | Disposition: A | Discharge status: Home / Self Care | Payer: BC Managed Care – PPO | Source: Ambulatory Visit | Attending: Hematology | Admitting: Hematology

## 2023-11-08 DIAGNOSIS — Z170 Estrogen receptor positive status [ER+]: Secondary | ICD-10-CM | POA: Insufficient documentation

## 2023-11-08 DIAGNOSIS — C50212 Malignant neoplasm of upper-inner quadrant of left female breast: Secondary | ICD-10-CM | POA: Diagnosis not present

## 2023-11-08 DIAGNOSIS — C7951 Secondary malignant neoplasm of bone: Secondary | ICD-10-CM | POA: Diagnosis not present

## 2023-11-08 LAB — GLUCOSE, CAPILLARY: Glucose-Capillary: 106 mg/dL — ABNORMAL HIGH (ref 70–99)

## 2023-11-08 MED ORDER — FLUDEOXYGLUCOSE F - 18 (FDG) INJECTION
5.8000 | Freq: Once | INTRAVENOUS | Status: AC
  Administered 2023-11-08: 6.2 via INTRAVENOUS

## 2023-11-08 NOTE — Progress Notes (Deleted)
Palliative Medicine [facility]  Telephone:(336) [phone] Fax:(336) [phone]   Name: Kristina Nicholson Date: 11/08/2023 MRN: [id number]  DOB: [birth year]  Patient Care Team: Patient, No Pcp Per as PCP - General (General Practice) Malachy Mood, MD as Consulting Physician (Hematology) Griselda Miner, MD as Consulting Physician (General Surgery) Lonie Peak, MD as Attending Physician (Radiation Oncology) Pickenpack-Cousar, Arty Baumgartner, NP as Nurse Practitioner (Hospice and Palliative Medicine)    INTERVAL HISTORY: Kristina Nicholson is a 57 y.o. female with oncologic medical history including estrogen receptor positive breast cancer (04/2017) with metastatic disease to the bone. Palliative ask to see for symptom management and goals of care.   SOCIAL HISTORY:     reports that she has been smoking cigarettes. She started smoking about 38 years ago. She has a 8 pack-year smoking history. She has never used smokeless tobacco. She reports that she does not drink alcohol and does not use drugs.  ADVANCE DIRECTIVES:  None on file  CODE STATUS: Full code  PAST MEDICAL HISTORY: Past Medical History:  Diagnosis Date   Chronic left shoulder pain    Headache    History of external beam radiation therapy    07-31-2017  to  09-10-2017  LEft chest wass 50 gy in 25 fractions, Left Supraclavicular 45 Gy in 25 fractions, Left Chest wall scar boost 10 Gy in 5 fractions.   Horseshoe kidney    Malignant neoplasm of upper-inner quadrant of left breast in female, estrogen receptor positive (HCC) 04/18/2017   oncologist--- dr Mosetta Putt;   dx 06/ 2018 ,  Stage IIB, ER/PR+, G3, IDC/ DCIS;  06-13-2017 s/p left mastectomy w/ node dissection ;  completed radiation 09-10-2017;   recurrence w/ mets to bone 07/ 2024 , started chemotherapy   Metastatic cancer to bone (HCC) 05/2023   Uterine fibroid     ALLERGIES:  has no known allergies.  MEDICATIONS:  Current Outpatient Medications   Medication Sig Dispense Refill   acetaminophen (TYLENOL) 500 MG tablet Take 2 tablets (1,000 mg total) by mouth every 6 (six) hours as needed. 30 tablet 0   aspirin EC 81 MG tablet Take 1 tablet (81 mg total) by mouth in the morning and at bedtime for 21 days. Swallow whole. 42 tablet 0   ferrous sulfate 325 (65 FE) MG tablet Take 1 tablet (325 mg total) by mouth daily with breakfast. 30 tablet 3   gabapentin (NEURONTIN) 300 MG capsule Take 1 capsule (300 mg total) by mouth 3 (three) times daily. 90 capsule 1   letrozole (FEMARA) 2.5 MG tablet Take 1 tablet (2.5 mg total) by mouth daily. (Patient not taking: Reported on 09/29/2023) 30 tablet 1   lidocaine (LIDODERM) 5 % Place 1 patch onto the skin daily. Remove & Discard patch within 12 hours or as directed by MD 30 patch 0   loperamide (IMODIUM A-D) 2 MG tablet Take 2 mg by mouth 4 (four) times daily as needed for diarrhea or loose stools.     Multiple Vitamins-Minerals (MULTIVITAMIN WOMEN PO) Take 1 tablet by mouth daily with breakfast.     Nutritional Supplements (CHLORELLA-SPIRULINA COMPLEX PO) Take 1 capsule by mouth daily as needed (to aid digestive health).     polyethylene glycol (MIRALAX / GLYCOLAX) 17 g packet Take 17 g by mouth 2 (two) times daily. 14 each 0   rivaroxaban (XARELTO) 10 MG TABS tablet Take 1 tablet (10 mg total) by mouth daily for 21 days. 7 tablet 0  senna (SENOKOT) 8.6 MG TABS tablet Take 2 tablets (17.2 mg total) by mouth at bedtime. 120 tablet 0   No current facility-administered medications for this visit.    VITAL SIGNS: LMP 06/22/2015 Comment: 06/08/17 vaginal bleeding for 4 days There were no vitals filed for this visit.  Estimated body mass index is 21.97 kg/m as calculated from the following:   Height as of 10/15/23: 5\' 3"  (1.6 m).   Weight as of 11/08/23: 124 lb (56.2 kg).   PERFORMANCE STATUS (ECOG) : 1 - Symptomatic but completely ambulatory   Physical Exam General: NAD Cardiovascular: regular  rate and rhythm Pulmonary: clear ant fields Abdomen: soft, nontender, + bowel sounds Extremities: no edema, no joint deformities Skin: no rashes Neurological:   IMPRESSION: ***  We discussed Her current illness and what it means in the larger context of Her on-going co-morbidities. Natural disease trajectory and expectations were discussed.  I discussed the importance of continued conversation with family and their medical providers regarding overall plan of care and treatment options, ensuring decisions are within the context of the patients values and GOCs.  PLAN: Established therapeutic relationship. Education provided on palliative's role in collaboration with their Oncology/Radiation team.  Metastatic Breast Cancer with Bone Involvement Severe pain in the left shoulder and hip, described as sore, stabbing, and cramping, with a severity of 7-8/10. Pain is worse at night and interferes with sleep. Patient has been taking Tylenol Extra Strength 500mg , 2 tablets every 6 hours, and Oxycodone as needed, but reports minimal relief. Previously tried Gabapentin at a low dose without significant benefit. -Reduce Tylenol Extra Strength to 2 tablets three times a day at maximum. -Consider Lidocaine patches for localized pain relief, apply for 12 hours on and 12 hours off. -Restart Gabapentin at a higher dose, 300mg  at bedtime and in the morning for the first week, then 300mg  three times a day if tolerated. -Continue Oxycodone as needed for severe pain episodes. -Review pain management in follow-up appointment on October 30, 2023.  Post Hip Replacement Surgery Mild pain in the hip, rated as 3-4/10, two weeks post-surgery. -Continue current pain management plan, monitor for changes or increases in pain.  Breast Cancer Treatment Patient is currently taking Letrozole (Femara) as prescribed. -Continue Letrozole as part of ongoing cancer treatment per Dr. Mosetta Putt  Follow-up.  -Patient knows to  contact office as needed prior to scheduled visit.   Patient expressed understanding and was in agreement with this plan. She also understands that She can call the clinic at any time with any questions, concerns, or complaints.   Any controlled substances utilized were prescribed in the context of palliative care. PDMP has been reviewed.    Visit consisted of counseling and education dealing with the complex and emotionally intense issues of symptom management and palliative care in the setting of serious and potentially life-threatening illness.  Willette Alma, AGPCNP-BC  Palliative Medicine Team/[facility]  *Please note that this is a verbal dictation therefore any spelling or grammatical errors are due to the "[employer]agon [employer] One" system interpretation.

## 2023-11-12 ENCOUNTER — Other Ambulatory Visit: Payer: Self-pay | Admitting: Hematology

## 2023-11-12 DIAGNOSIS — Z170 Estrogen receptor positive status [ER+]: Secondary | ICD-10-CM

## 2023-11-16 ENCOUNTER — Inpatient Hospital Stay: Payer: BC Managed Care – PPO

## 2023-11-16 ENCOUNTER — Inpatient Hospital Stay: Payer: BC Managed Care – PPO | Admitting: Hematology

## 2023-11-19 ENCOUNTER — Inpatient Hospital Stay: Payer: BC Managed Care – PPO | Attending: Hematology | Admitting: Hematology

## 2023-11-19 ENCOUNTER — Inpatient Hospital Stay: Payer: BC Managed Care – PPO | Attending: Hematology

## 2023-11-19 NOTE — Progress Notes (Deleted)
 Palliative Medicine [facility]  [phone] [phone]   Name: Kristina Nicholson Date: 11/19/2023 MRN: [id number]  DOB: 1966-01-02  Patient Care Team: Patient, [name], MD as Consulting Physician (Hematology) [name], MD as Consulting Physician (General Surgery) [name], MD as Attending Physician (Radiation Oncology) Pickenpack-Cousar, [name], NP as Nurse Practitioner (Hospice and Palliative Medicine)   I connected with Larry Erin Mink on 11/19/23 at  9:00 AM EST by phone and verified that I am speaking with the correct person using two identifiers.   I discussed the limitations, risks, security and privacy concerns of performing an evaluation and management service by telemedicine and the availability of in-person appointments. I also discussed with the patient that there may be a patient responsible charge related to this service. The patient expressed understanding and agreed to proceed.   Other persons participating in the visit and their role in the encounter: n/a   Patient's location: home  Provider's location: [facility]   Chief Complaint: follow up of symptom management    INTERVAL HISTORY: Kristina Nicholson is a 58 y.o. female with oncologic medical history including estrogen receptor positive breast cancer (04/2017) with metastatic disease to the bone. Palliative ask to see for symptom management and goals of care.   SOCIAL HISTORY:     reports that she has been smoking cigarettes. She started smoking about 38 years ago. She has a 8 pack-year smoking history. She has never used smokeless tobacco. She reports that she does not drink alcohol and does not use drugs.  ADVANCE DIRECTIVES:  None on file  CODE STATUS: Full code  PAST MEDICAL HISTORY: Past Medical History:  Diagnosis Date   Chronic left shoulder pain    Headache    History of external beam  radiation therapy    07-31-2017  to  09-10-2017  LEft chest wass 50 gy in 25 fractions, Left Supraclavicular 45 Gy in 25 fractions, Left Chest wall scar boost 10 Gy in 5 fractions.   Horseshoe kidney    Malignant neoplasm of upper-inner quadrant of left breast in female, estrogen receptor positive (HCC) 04/18/2017   oncologist--- dr lanny;   dx 06/ 2018 ,  Stage IIB, ER/PR+, G3, IDC/ DCIS;  06-13-2017 s/p left mastectomy w/ node dissection ;  completed radiation 09-10-2017;   recurrence w/ mets to bone 07/ 2024 , started chemotherapy   Metastatic cancer to bone (HCC) 05/2023   Uterine fibroid     ALLERGIES:  has no known allergies.  MEDICATIONS:  Current Outpatient Medications  Medication Sig Dispense Refill   acetaminophen  (TYLENOL ) 500 MG tablet Take 2 tablets (1,000 mg total) by mouth every 6 (six) hours as needed. 30 tablet 0   ferrous sulfate  325 (65 FE) MG tablet Take 1 tablet (325 mg total) by mouth daily with breakfast. 30 tablet 3   gabapentin  (NEURONTIN ) 300 MG capsule Take 1 capsule (300 mg total) by mouth 3 (three) times daily. 90 capsule 1   letrozole  (FEMARA ) 2.5 MG tablet Take 1 tablet (2.5 mg total) by mouth daily. (Patient not taking: Reported on 09/29/2023) 30 tablet 1   lidocaine  (LIDODERM ) 5 % Place 1 patch onto the skin daily. Remove & Discard patch within 12 hours or as directed by MD 30 patch 0   loperamide (IMODIUM A-D) 2 MG tablet Take 2 mg by mouth 4 (four) times daily as needed for diarrhea or loose stools.  Multiple Vitamins-Minerals (MULTIVITAMIN WOMEN PO) Take 1 tablet by mouth daily with breakfast.     Nutritional Supplements (CHLORELLA-SPIRULINA COMPLEX PO) Take 1 capsule by mouth daily as needed (to aid digestive health).     polyethylene glycol (MIRALAX  / GLYCOLAX ) 17 g packet Take 17 g by mouth 2 (two) times daily. 14 each 0   rivaroxaban  (XARELTO ) 10 MG TABS tablet Take 1 tablet (10 mg total) by mouth daily for 21 days. 7 tablet 0   senna (SENOKOT) 8.6 MG  TABS tablet Take 2 tablets (17.2 mg total) by mouth at bedtime. 120 tablet 0   No current facility-administered medications for this visit.    VITAL SIGNS: LMP 06/22/2015 Comment: 06/08/17 vaginal bleeding for 4 days There were no vitals filed for this visit.  Estimated body mass index is 21.97 kg/m as calculated from the following:   Height as of 10/15/23: 5' 3 (1.6 m).   Weight as of 11/08/23: 124 lb (56.2 kg).   PERFORMANCE STATUS (ECOG) : 1 - Symptomatic but completely ambulatory   Physical Exam General: NAD Cardiovascular: regular rate and rhythm Pulmonary: clear ant fields Abdomen: soft, nontender, + bowel sounds Extremities: no edema, no joint deformities Skin: no rashes Neurological:   IMPRESSION: ***  We discussed Her current illness and what it means in the larger context of Her on-going co-morbidities. Natural disease trajectory and expectations were discussed.  I discussed the importance of continued conversation with family and their medical providers regarding overall plan of care and treatment options, ensuring decisions are within the context of the patients values and GOCs.  PLAN: Established therapeutic relationship. Education provided on palliative's role in collaboration with their Oncology/Radiation team.  Metastatic Breast Cancer with Bone Involvement Severe pain in the left shoulder and hip, described as sore, stabbing, and cramping, with a severity of 7-8/10. Pain is worse at night and interferes with sleep. Patient has been taking Tylenol  Extra Strength 500mg , 2 tablets every 6 hours, and Oxycodone  as needed, but reports minimal relief. Previously tried Gabapentin  at a low dose without significant benefit. -Reduce Tylenol  Extra Strength to 2 tablets three times a day at maximum. -Consider Lidocaine  patches for localized pain relief, apply for 12 hours on and 12 hours off. -Restart Gabapentin  at a higher dose, 300mg  at bedtime and in the morning for the  first week, then 300mg  three times a day if tolerated. -Continue Oxycodone  as needed for severe pain episodes. -Review pain management in follow-up appointment on October 30, 2023.  Post Hip Replacement Surgery Mild pain in the hip, rated as 3-4/10, two weeks post-surgery. -Continue current pain management plan, monitor for changes or increases in pain.  Breast Cancer Treatment Patient is currently taking Letrozole  (Femara ) as prescribed. -Continue Letrozole  as part of ongoing cancer treatment per Dr. Lanny  Follow-up.  -Patient knows to contact office as needed prior to scheduled visit.   Patient expressed understanding and was in agreement with this plan. She also understands that She can call the clinic at any time with any questions, concerns, or complaints.   Any controlled substances utilized were prescribed in the context of palliative care. PDMP has been reviewed.    Visit consisted of counseling and education dealing with the complex and emotionally intense issues of symptom management and palliative care in the setting of serious and potentially life-threatening illness.  Levon Borer, AGPCNP-BC  Palliative Medicine Team/[facility]  *Please note that this is a verbal dictation therefore any spelling or grammatical errors are  due to the [employer] One system interpretation.

## 2023-11-20 ENCOUNTER — Inpatient Hospital Stay (HOSPITAL_BASED_OUTPATIENT_CLINIC_OR_DEPARTMENT_OTHER): Payer: BC Managed Care – PPO | Admitting: Hematology

## 2023-11-20 ENCOUNTER — Encounter: Payer: Self-pay | Admitting: Hematology

## 2023-11-20 DIAGNOSIS — Z170 Estrogen receptor positive status [ER+]: Secondary | ICD-10-CM | POA: Diagnosis not present

## 2023-11-20 DIAGNOSIS — C50212 Malignant neoplasm of upper-inner quadrant of left female breast: Secondary | ICD-10-CM

## 2023-11-20 MED ORDER — ABEMACICLIB 100 MG PO TABS: 100.0000 mg | ORAL_TABLET | Freq: Two times a day (BID) | ORAL | 2 refills | Status: DC

## 2023-11-20 MED ORDER — LETROZOLE 2.5 MG PO TABS: 2.5000 mg | ORAL_TABLET | Freq: Every day | ORAL | 5 refills | Status: DC

## 2023-11-20 NOTE — Progress Notes (Signed)
 [facility]   [phone] Fax:([phone]) [phone]   Clinic Follow up Note   Patient Care Team: Patient, [name], MD as Consulting Physician (Hematology) [name], MD as Consulting Physician (General Surgery) [name], MD as Attending Physician (Radiation Oncology) [name], NP as Nurse Practitioner (Hospice and Palliative Medicine) 11/20/2023  I connected with Larry Erin Mink on 11/20/23 at  2:20 PM EST by telephone and verified that I am speaking with the correct person using two identifiers.   I discussed the limitations, risks, security and privacy concerns of performing an evaluation and management service by telephone and the availability of in person appointments. I also discussed with the patient that there may be a patient responsible charge related to this service. The patient expressed understanding and agreed to proceed.   Patient's location:  Home  Provider's location:  Home    CHIEF COMPLAINT: f/u metastatic breast cancer    CURRENT THERAPY: letrozole  and verzenio  100mg  bid   Assessment and plan Malignant neoplasm of upper-inner quadrant of left breast in female, estrogen receptor positive (HCC) [name], stage IIB, ER: Positive, PR: Negative, HER2: Negative, Grade 3, bone metastasis in 05/2023 -She was diagnosed in 04/2017. She is s/p left breast mastectomy and adjuvant radiation. Mammaprint showed low risk. -She started antiestrogen therapy with Tamoxifen  in 10/2017. She is tolerating well, plan for 10 year. She has not had a menstrual period since 2018.  Her FSH in March 2021 showed that she is still premenopausal --Due to worsening left shoulder pain, she underwent CT and bone scan in [place] during her trip. Unfortunately scan showed multiple bone lesion and uptake in sternum, cervical spine, left scapular, left 10th rib, and left hip, concerning for bone mets. She  underwent bone biopsy of left scapula and sternum, both confirmed metastatic breast cancer, ER positive (weak to moderate in left scapula biopsy, and 80% strong positive in sternal biopsy), PR weak to moderate positive, HER2 2+ in left scapular, 1+ in sternal bone biopsy -PET scan from June 21, 2023 showed multifocal hypermetabolic osseous metastatic disease. No definite pathologic fracture or epidural tumor identified.  No visceral metastasis.   -she started fulvestrant  injection and Verzenio  on August 13, 2023.  Due to poor tolerance to Verzenio , dose was reduced to 100 mg twice daily -She underwent BSO on August 24, 2023, surgical path was benign. -Due to severe right hip pain from bone metastasis, she underwent right hip replacement on September 30, 2023 -She is currently on letrozole  and Verzenio  100 mg twice daily with moderate diarrhea -Restaging PET scan November 08, 2023 showed excellent response in bone metastasis.  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 -She could not tolerate full dose Verzenio , but is tolerating 100 mg twice daily reasonably well with moderate diarrhea, will continue use Imodium as needed  Bone metastasis -Continue calcium and vitamin D - Zometa  every 3 months  Plan -PET scan reviewed, she has had a good response -I reviewed her Guardant360 results -Will continue letrozole  and Verzenio  100 mg twice daily -Lab, follow-up and Zometa  in 4 weeks         SUMMARY OF ONCOLOGIC HISTORY: Oncology History Overview Note  Cancer Staging Malignant neoplasm of upper-inner quadrant of left breast in female, estrogen receptor positive (HCC) Staging form: Breast, AJCC 8th Edition - Clinical stage from 04/13/2017: Stage IIA (cT2, cN0, cM0, G2, ER: Positive, PR:  Negative, HER2: Negative) - Signed by [name], MD on 04/25/2017 - Pathologic stage from 06/13/2017: Stage IIB (pT2, pN1a, cM0, G1, ER: Positive, PR:  Negative, HER2: Negative) - Signed by [name], MD on 09/04/2017     Malignant neoplasm of upper-inner quadrant of left breast in female, estrogen receptor positive (HCC)  04/10/2017 Mammogram   Left breast mammo and US  showed an irregular hypoechoic shadowing mass at 9 o'clock 1 cm from the nipple measuring 2.3 x 1 x 2.4 cm. This corresponds well with the mass seen at mammography. No lymphadenopathy seen in the left axilla.   04/18/2017 Initial Diagnosis   Malignant neoplasm of upper-inner quadrant of left breast in female, estrogen receptor positive (HCC)   04/22/2017 Receptors her2   Estrogen Receptor: 95%, POSITIVE, STRONG STAINING INTENSITY Progesterone Receptor: 0%, NEGATIVE Proliferation Marker Ki67: 10% HER2 -   04/22/2017 Initial Biopsy   Breast, left, needle core biopsy, 9:30 o'clock - INVASIVE DUCTAL CARCINOMA, G2 - DUCTAL CARCINOMA IN SITU.   06/13/2017 Surgery   Left breast mastectomy with sentinel lymph node biopsy performed by Dr Curvin.    06/13/2017 Pathology Results   Breast, simple mastectomy, Left - INVASIVE DUCTAL CARCINOMA, GRADE I/III, SPANNING 2.5 CM. - DUCTAL CARCINOMA IN SITU, INTERMEDIATE GRADE. - LOBULAR NEOPLASIA (ATYPICAL LOBULAR HYPERPLASIA). - THE SURGICAL RESECTION MARGINS ARE NEGATIVE FOR CARCINOMA.  Lymph node, sentinel, biopsy, Left axillary #1 - METASTATIC CARCINOMA IN 1 OF 3 LYMPH NODE (1/3), WITH EXTRACAPSULAR EXTENSION.   06/13/2017 Pathology Results   Mammaprint with low risk disease.    07/31/2017 - 09/10/2017 Radiation Therapy   The patient began a course of radiotherapy over the left chest wall and axillary area, supervised by Dr Lauraine Golden, MD.     Radiation treatment dates:   07/31/2017 - 09/10/2017   Site/dose:    1) Left Chest Wall / 50 Gy in 25 fractions 2) Left Supraclavicular / 45 Gy in 25 fractions 3) Left Chest Wall Scar Boost / 10 Gy in 5 fractions   Beams/energy:    1) Opposed tangents 3D/ 10 and 6 MV photons 2) Additional  fields 3D / 10 MV and 6 MV photons 3) En face electrons / 6 MeV electrons   Narrative: The patient tolerated radiation treatment relatively well. She experienced mild fatigue. She reported pain, burning, and itching to the left breast and swelling under the left arm. On physical exam, she had dry peeling in the left UIQ of the breast and left axilla, as well as diffuse hyperpigmentation and erythema of the left chest wall. She is using the radiaplex cream to the treatment area and neosporin to the peeling areas.   10/13/2017 -  Anti-estrogen oral therapy   Tamoxifen  10mg  daily   04/11/2018 Mammogram   04/11/2018 Mammogram IMPRESSION: No mammographic evidence of malignancy. A result letter of this screening mammogram will be mailed directly to the patient.   04/11/2018 Mammogram   Screening Unilateral Right: No evidence of mammographic malignancy.       History of Present Illness    Mrs. Muralles is scheduled for a phone visit for follow-up of her breast cancer.  I have verified her identity days.  She is recovering well from her surgery, able to walk independently but still has 3 out of 10 pain at the right hip.  No other new pain.  She is tolerating with a new overall well, with loose bowel movement 2-4 times a day.  She does take Imodium as needed.  No other  new complaints.      REVIEW OF SYSTEMS:   Constitutional: Denies fevers, chills or abnormal weight loss Eyes: Denies blurriness of vision Ears, nose, mouth, throat, and face: Denies mucositis or sore throat Respiratory: Denies cough, dyspnea or wheezes Cardiovascular: Denies palpitation, chest discomfort or lower extremity swelling Gastrointestinal:  Denies nausea, heartburn or change in bowel habits Skin: Denies abnormal skin rashes Lymphatics: Denies new lymphadenopathy or easy bruising Neurological:Denies numbness, tingling or new weaknesses Behavioral/Psych: Mood is stable, no new changes  All other systems were reviewed  with the patient and are negative.  MEDICAL HISTORY:  Past Medical History:  Diagnosis Date   Chronic left shoulder pain    Headache    History of external beam radiation therapy    07-31-2017  to  09-10-2017  LEft chest wass 50 gy in 25 fractions, Left Supraclavicular 45 Gy in 25 fractions, Left Chest wall scar boost 10 Gy in 5 fractions.   Horseshoe kidney    Malignant neoplasm of upper-inner quadrant of left breast in female, estrogen receptor positive (HCC) 04/18/2017   oncologist--- dr lanny;   dx 06/ 2018 ,  Stage IIB, ER/PR+, G3, IDC/ DCIS;  06-13-2017 s/p left mastectomy w/ node dissection ;  completed radiation 09-10-2017;   recurrence w/ mets to bone 07/ 2024 , started chemotherapy   Metastatic cancer to bone (HCC) 05/2023   Uterine fibroid     SURGICAL HISTORY: Past Surgical History:  Procedure Laterality Date   APPENDECTOMY  2005   COLONOSCOPY  2016   GANGLION CYST EXCISION Right 09/24/2017   Procedure: EXCISION OF RIGHT DORSAL WRIST GANGLION;  Surgeon: Sebastian Lenis, MD;  Location: [facility];  Service: Orthopedics;  Laterality: Right;   LAPAROSCOPIC SALPINGO OOPHERECTOMY Bilateral 08/24/2023   Procedure: LAPAROSCOPIC SALPINGO OOPHORECTOMY;  Surgeon: Dannielle Bouchard, DO;  Location: [facility];  Service: Gynecology;  Laterality: Bilateral;   LIPOMA EXCISION Left 06/13/2017   Procedure: EXCISION 3CM LIPOMA ON LEFT ARM;  Surgeon: [name], MD;  Location: [facility] OR;  Service: General;  Laterality: Left;   MASTECTOMY W/ SENTINEL NODE BIOPSY Left 06/13/2017   Procedure: MASTECTOMY WITH SENTINEL LYMPH NODE BIOPSY;  Surgeon: [name], MD;  Location: [facility] OR;  Service: General;  Laterality: Left;   TOTAL HIP ARTHROPLASTY Right 09/30/2023   Procedure: TOTAL HIP ARTHROPLASTY ANTERIOR APPROACH;  Surgeon: Ernie Cough, MD;  Location: [facility];  Service: Orthopedics;  Laterality: Right;    I have reviewed the social history and family history with  the patient and they are unchanged from previous note.  ALLERGIES:  has no known allergies.  MEDICATIONS:  Current Outpatient Medications  Medication Sig Dispense Refill   abemaciclib  (VERZENIO ) 100 MG tablet Take 1 tablet (100 mg total) by mouth 2 (two) times daily. 60 tablet 2   acetaminophen  (TYLENOL ) 500 MG tablet Take 2 tablets (1,000 mg total) by mouth every 6 (six) hours as needed. 30 tablet 0   ferrous sulfate  325 (65 FE) MG tablet Take 1 tablet (325 mg total) by mouth daily with breakfast. 30 tablet 3   gabapentin  (NEURONTIN ) 300 MG capsule Take 1 capsule (300 mg total) by mouth 3 (three) times daily. 90 capsule 1   letrozole  (FEMARA ) 2.5 MG tablet Take 1 tablet (2.5 mg total) by mouth daily. 30 tablet 5   lidocaine  (LIDODERM ) 5 % Place 1 patch onto the skin daily. Remove & Discard patch within 12 hours or as directed by MD 30 patch 0  loperamide (IMODIUM A-D) 2 MG tablet Take 2 mg by mouth 4 (four) times daily as needed for diarrhea or loose stools.     Multiple Vitamins-Minerals (MULTIVITAMIN WOMEN PO) Take 1 tablet by mouth daily with breakfast.     Nutritional Supplements (CHLORELLA-SPIRULINA COMPLEX PO) Take 1 capsule by mouth daily as needed (to aid digestive health).     polyethylene glycol (MIRALAX  / GLYCOLAX ) 17 g packet Take 17 g by mouth 2 (two) times daily. 14 each 0   rivaroxaban  (XARELTO ) 10 MG TABS tablet Take 1 tablet (10 mg total) by mouth daily for 21 days. 7 tablet 0   senna (SENOKOT) 8.6 MG TABS tablet Take 2 tablets (17.2 mg total) by mouth at bedtime. 120 tablet 0   No current facility-administered medications for this visit.    PHYSICAL EXAMINATION: Not performed   LABORATORY DATA:  I have reviewed the data as listed    Latest Ref Rng & Units 10/15/2023    3:15 PM 10/02/2023    3:50 AM 10/01/2023    3:46 AM  CBC  WBC 4.0 - 10.5 K/uL 5.0  5.5  6.2   Hemoglobin 12.0 - 15.0 g/dL 9.4  8.3  9.3   Hematocrit 36.0 - 46.0 % 28.8  24.7  27.3   Platelets 150  - 400 K/uL 433  167  180         Latest Ref Rng & Units 10/15/2023    3:15 PM 10/01/2023    3:46 AM 09/30/2023    3:55 AM  CMP  Glucose 70 - 99 mg/dL 82  843  85   BUN 6 - 20 mg/dL 12  15  17    Creatinine 0.44 - 1.00 mg/dL 9.38  9.12  9.12   Sodium 135 - 145 mmol/L 140  137  138   Potassium 3.5 - 5.1 mmol/L 3.9  3.7  3.7   Chloride 98 - 111 mmol/L 105  106  106   CO2 22 - 32 mmol/L 29  23  25    Calcium 8.9 - 10.3 mg/dL 9.2  8.5  8.7   Total Protein 6.5 - 8.1 g/dL 7.0   6.5   Total Bilirubin <1.2 mg/dL 0.3   0.6   Alkaline Phos 38 - 126 U/L 69   44   AST 15 - 41 U/L 17   17   ALT 0 - 44 U/L 17   19       RADIOGRAPHIC STUDIES: I have personally reviewed the radiological images as listed and agreed with the findings in the report. No results found.     I discussed the assessment and treatment plan with the patient. The patient was provided an opportunity to ask questions and all were answered. The patient agreed with the plan and demonstrated an understanding of the instructions.   The patient was advised to call back or seek an in-person evaluation if the symptoms worsen or if the condition fails to improve as anticipated.  I provided 22 minutes of non face-to-face telephone visit time during this encounter, and > 50% was spent counseling as documented under my assessment & plan.     Onita Mattock, MD 11/20/23

## 2023-11-20 NOTE — Assessment & Plan Note (Signed)
 [name], stage IIB, ER: Positive, PR: Negative, HER2: Negative, Grade 3, bone metastasis in 05/2023 -She was diagnosed in 04/2017. She is s/p left breast mastectomy and adjuvant radiation. Mammaprint showed low risk. -She started antiestrogen therapy with Tamoxifen in 10/2017. She is tolerating well, plan for 10 year. She has not had a menstrual period since 2018.  Her FSH in March 2021 showed that she is still premenopausal --Due to worsening left shoulder pain, she underwent CT and bone scan in [place] during her trip. Unfortunately scan showed multiple bone lesion and uptake in sternum, cervical spine, left scapular, left 10th rib, and left hip, concerning for bone mets. She underwent bone biopsy of left scapula and sternum, both confirmed metastatic breast cancer, ER positive (weak to moderate in left scapula biopsy, and 80% strong positive in sternal biopsy), PR weak to moderate positive, HER2 2+ in left scapular, 1+ in sternal bone biopsy -PET scan from June 21, 2023 showed multifocal hypermetabolic osseous metastatic disease. No definite pathologic fracture or epidural tumor identified.  No visceral metastasis.   -she started fulvestrant injection and Verzenio on August 13, 2023.  Due to poor tolerance to Verzenio, dose was reduced to 100 mg twice daily -She underwent BSO on August 24, 2023, surgical path was benign. -Due to severe right hip pain from bone metastasis, she underwent right hip replacement on September 30, 2023 -She is currently on letrozole and Verzenio 100 mg twice daily with moderate diarrhea -Restaging PET scan November 08, 2023 showed excellent response in bone metastasis.  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

## 2023-11-22 ENCOUNTER — Telehealth: Payer: Self-pay | Admitting: Genetic Counselor

## 2023-11-22 NOTE — Telephone Encounter (Signed)
 Scheduled per scheduling message. Patient is aware of the made appointments.

## 2023-11-26 DIAGNOSIS — Z471 Aftercare following joint replacement surgery: Secondary | ICD-10-CM | POA: Diagnosis not present

## 2023-11-26 DIAGNOSIS — Z96641 Presence of right artificial hip joint: Secondary | ICD-10-CM | POA: Diagnosis not present

## 2023-12-17 DIAGNOSIS — Z0000 Encounter for general adult medical examination without abnormal findings: Secondary | ICD-10-CM | POA: Diagnosis not present

## 2023-12-17 DIAGNOSIS — E559 Vitamin D deficiency, unspecified: Secondary | ICD-10-CM | POA: Diagnosis not present

## 2023-12-17 DIAGNOSIS — Z131 Encounter for screening for diabetes mellitus: Secondary | ICD-10-CM | POA: Diagnosis not present

## 2023-12-17 DIAGNOSIS — R5383 Other fatigue: Secondary | ICD-10-CM | POA: Diagnosis not present

## 2023-12-17 DIAGNOSIS — R202 Paresthesia of skin: Secondary | ICD-10-CM | POA: Diagnosis not present

## 2023-12-17 DIAGNOSIS — Z13220 Encounter for screening for lipoid disorders: Secondary | ICD-10-CM | POA: Diagnosis not present

## 2023-12-17 DIAGNOSIS — R200 Anesthesia of skin: Secondary | ICD-10-CM | POA: Diagnosis not present

## 2023-12-24 ENCOUNTER — Other Ambulatory Visit (HOSPITAL_COMMUNITY): Payer: Self-pay

## 2023-12-24 NOTE — Progress Notes (Deleted)
 Palliative Medicine [facility]  [phone] Fax:(336) [phone]   Name: Kristina Nicholson Date: 12/24/2023 MRN: [id number]  DOB: [birth year]  Patient Care Team: Patient, No Pcp Per as PCP - General (General Practice) Malachy Mood, MD as Consulting Physician (Hematology) Griselda Miner, MD as Consulting Physician (General Surgery) Lonie Peak, MD as Attending Physician (Radiation Oncology) Pickenpack-Cousar, Arty Baumgartner, NP as Nurse Practitioner (Hospice and Palliative Medicine)    INTERVAL HISTORY: Kristina Nicholson is a 58 y.o. female with oncologic medical history including estrogen receptor positive breast cancer (04/2017) with metastatic disease to the bone. Palliative ask to see for symptom management and goals of care.    SOCIAL HISTORY:     reports that she has been smoking cigarettes. She started smoking about 38 years ago. She has a 8 pack-year smoking history. She has never used smokeless tobacco. She reports that she does not drink alcohol and does not use drugs.  ADVANCE DIRECTIVES:  None on file  CODE STATUS: Full code  PAST MEDICAL HISTORY: Past Medical History:  Diagnosis Date   Chronic left shoulder pain    Headache    History of external beam radiation therapy    07-31-2017  to  09-10-2017  LEft chest wass 50 gy in 25 fractions, Left Supraclavicular 45 Gy in 25 fractions, Left Chest wall scar boost 10 Gy in 5 fractions.   Horseshoe kidney    Malignant neoplasm of upper-inner quadrant of left breast in female, estrogen receptor positive (HCC) 04/18/2017   oncologist--- dr Mosetta Putt;   dx 06/ 2018 ,  Stage IIB, ER/PR+, G3, IDC/ DCIS;  06-13-2017 s/p left mastectomy w/ node dissection ;  completed radiation 09-10-2017;   recurrence w/ mets to bone 07/ 2024 , started chemotherapy   Metastatic cancer to bone (HCC) 05/2023   Uterine fibroid     ALLERGIES:  has no known allergies.  MEDICATIONS:  Current Outpatient Medications   Medication Sig Dispense Refill   abemaciclib (VERZENIO) 100 MG tablet Take 1 tablet (100 mg total) by mouth 2 (two) times daily. 60 tablet 2   acetaminophen (TYLENOL) 500 MG tablet Take 2 tablets (1,000 mg total) by mouth every 6 (six) hours as needed. 30 tablet 0   ferrous sulfate 325 (65 FE) MG tablet Take 1 tablet (325 mg total) by mouth daily with breakfast. 30 tablet 3   gabapentin (NEURONTIN) 300 MG capsule Take 1 capsule (300 mg total) by mouth 3 (three) times daily. 90 capsule 1   letrozole (FEMARA) 2.5 MG tablet Take 1 tablet (2.5 mg total) by mouth daily. 30 tablet 5   lidocaine (LIDODERM) 5 % Place 1 patch onto the skin daily. Remove & Discard patch within 12 hours or as directed by MD 30 patch 0   loperamide (IMODIUM A-D) 2 MG tablet Take 2 mg by mouth 4 (four) times daily as needed for diarrhea or loose stools.     Multiple Vitamins-Minerals (MULTIVITAMIN WOMEN PO) Take 1 tablet by mouth daily with breakfast.     Nutritional Supplements (CHLORELLA-SPIRULINA COMPLEX PO) Take 1 capsule by mouth daily as needed (to aid digestive health).     polyethylene glycol (MIRALAX / GLYCOLAX) 17 g packet Take 17 g by mouth 2 (two) times daily. 14 each 0   rivaroxaban (XARELTO) 10 MG TABS tablet Take 1 tablet (10 mg total) by mouth daily for 21 days. 7 tablet 0   senna (SENOKOT) 8.6 MG TABS tablet Take 2 tablets (17.2 mg  total) by mouth at bedtime. 120 tablet 0   No current facility-administered medications for this visit.    VITAL SIGNS: LMP 06/22/2015 Comment: 06/08/17 vaginal bleeding for 4 days There were no vitals filed for this visit.  Estimated body mass index is 21.97 kg/m as calculated from the following:   Height as of 10/15/23: 5\' 3"  (1.6 m).   Weight as of 11/08/23: 124 lb (56.2 kg).   PERFORMANCE STATUS (ECOG) : {CHL ONC ECOG [id number]   Physical Exam General: NAD Cardiovascular: regular rate and rhythm Pulmonary: clear ant fields Abdomen: soft, nontender, + bowel  sounds Extremities: no edema, no joint deformities Skin: no rashes Neurological:   IMPRESSION: ***  We discussed Her current illness and what it means in the larger context of Her on-going co-morbidities. Natural disease trajectory and expectations were discussed.  I discussed the importance of continued conversation with family and their medical providers regarding overall plan of care and treatment options, ensuring decisions are within the context of the patients values and GOCs.  PLAN: Established therapeutic relationship. Education provided on palliative's role in collaboration with their Oncology/Radiation team.  Metastatic Breast Cancer with Bone Involvement Severe pain in the left shoulder and hip, described as sore, stabbing, and cramping, with a severity of 7-8/10. Pain is worse at night and interferes with sleep. Patient has been taking Tylenol Extra Strength 500mg , 2 tablets every 6 hours, and Oxycodone as needed, but reports minimal relief. Previously tried Gabapentin at a low dose without significant benefit. -Reduce Tylenol Extra Strength to 2 tablets three times a day at maximum. -Consider Lidocaine patches for localized pain relief, apply for 12 hours on and 12 hours off. -Restart Gabapentin at a higher dose, 300mg  at bedtime and in the morning for the first week, then 300mg  three times a day if tolerated. -Continue Oxycodone as needed for severe pain episodes. -Review pain management in follow-up appointment on October 30, 2023.  Post Hip Replacement Surgery Mild pain in the hip, rated as 3-4/10, two weeks post-surgery. -Continue current pain management plan, monitor for changes or increases in pain.  Breast Cancer Treatment Patient is currently taking Letrozole (Femara) as prescribed. -Continue Letrozole as part of ongoing cancer treatment per Dr. Mosetta Putt  Follow-up.  -Patient knows to contact office as needed prior to scheduled visit.   Patient expressed  understanding and was in agreement with this plan. She also understands that She can call the clinic at any time with any questions, concerns, or complaints.   Any controlled substances utilized were prescribed in the context of palliative care. PDMP has been reviewed.    Visit consisted of counseling and education dealing with the complex and emotionally intense issues of symptom management and palliative care in the setting of serious and potentially life-threatening illness.  Willette Alma, AGPCNP-BC  Palliative Medicine Team/[facility]

## 2023-12-25 NOTE — Assessment & Plan Note (Signed)
[name], stage IIB, ER: Positive, PR: Negative, HER2: Negative, Grade 3, bone metastasis in 05/2023 -She was diagnosed in 04/2017. She is s/p left breast mastectomy and adjuvant radiation. Mammaprint showed low risk. -She started antiestrogen therapy with Tamoxifen in 10/2017. She is tolerating well, plan for 10 year. She has not had a menstrual period since 2018.  Her FSH in March 2021 showed that she is still premenopausal --Due to worsening left shoulder pain, she underwent CT and bone scan in [place] during her trip. Unfortunately scan showed multiple bone lesion and uptake in sternum, cervical spine, left scapular, left 10th rib, and left hip, concerning for bone mets. She underwent bone biopsy of left scapula and sternum, both confirmed metastatic breast cancer, ER positive (weak to moderate in left scapula biopsy, and 80% strong positive in sternal biopsy), PR weak to moderate positive, HER2 2+ in left scapular, 1+ in sternal bone biopsy -PET scan from June 21, 2023 showed multifocal hypermetabolic osseous metastatic disease. No definite pathologic fracture or epidural tumor identified.  No visceral metastasis.   -she started fulvestrant injection and Verzenio on August 13, 2023.  Due to poor tolerance to Verzenio, dose was reduced to 100 mg twice daily -She underwent BSO on August 24, 2023, surgical path was benign. -Due to severe right hip pain from bone metastasis, she underwent right hip replacement on September 30, 2023 -She is currently on letrozole and Verzenio 100 mg twice daily with moderate diarrhea -Restaging PET scan November 08, 2023 showed excellent response in bone metastasis.  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

## 2023-12-26 ENCOUNTER — Inpatient Hospital Stay: Payer: BC Managed Care – PPO | Admitting: Nurse Practitioner

## 2023-12-26 ENCOUNTER — Other Ambulatory Visit (HOSPITAL_COMMUNITY): Payer: Self-pay

## 2023-12-26 ENCOUNTER — Inpatient Hospital Stay (HOSPITAL_BASED_OUTPATIENT_CLINIC_OR_DEPARTMENT_OTHER): Payer: BC Managed Care – PPO | Admitting: Hematology

## 2023-12-26 ENCOUNTER — Encounter: Payer: Self-pay | Admitting: Hematology

## 2023-12-26 ENCOUNTER — Telehealth: Payer: Self-pay | Admitting: Pharmacy Technician

## 2023-12-26 ENCOUNTER — Inpatient Hospital Stay: Payer: BC Managed Care – PPO | Attending: Hematology

## 2023-12-26 ENCOUNTER — Telehealth: Payer: Self-pay | Admitting: Pharmacist

## 2023-12-26 VITALS — BP 129/66 | HR 75 | Temp 97.7°F | Resp 16 | Wt 127.4 lb

## 2023-12-26 DIAGNOSIS — Z170 Estrogen receptor positive status [ER+]: Secondary | ICD-10-CM

## 2023-12-26 DIAGNOSIS — R978 Other abnormal tumor markers: Secondary | ICD-10-CM | POA: Insufficient documentation

## 2023-12-26 DIAGNOSIS — Z9221 Personal history of antineoplastic chemotherapy: Secondary | ICD-10-CM | POA: Insufficient documentation

## 2023-12-26 DIAGNOSIS — Z79811 Long term (current) use of aromatase inhibitors: Secondary | ICD-10-CM | POA: Diagnosis not present

## 2023-12-26 DIAGNOSIS — C50212 Malignant neoplasm of upper-inner quadrant of left female breast: Secondary | ICD-10-CM | POA: Insufficient documentation

## 2023-12-26 DIAGNOSIS — Z9012 Acquired absence of left breast and nipple: Secondary | ICD-10-CM | POA: Diagnosis not present

## 2023-12-26 DIAGNOSIS — C7951 Secondary malignant neoplasm of bone: Secondary | ICD-10-CM | POA: Diagnosis not present

## 2023-12-26 DIAGNOSIS — Z923 Personal history of irradiation: Secondary | ICD-10-CM | POA: Diagnosis not present

## 2023-12-26 DIAGNOSIS — Z79899 Other long term (current) drug therapy: Secondary | ICD-10-CM | POA: Diagnosis not present

## 2023-12-26 DIAGNOSIS — G629 Polyneuropathy, unspecified: Secondary | ICD-10-CM | POA: Insufficient documentation

## 2023-12-26 MED ORDER — LETROZOLE 2.5 MG PO TABS
2.5000 mg | ORAL_TABLET | Freq: Every day | ORAL | 5 refills | Status: DC
  Filled 2023-12-26: qty 30, 30d supply, fill #0
  Filled 2024-01-21: qty 30, 30d supply, fill #1
  Filled 2024-02-25: qty 30, 30d supply, fill #2
  Filled 2024-03-20: qty 30, 30d supply, fill #3
  Filled 2024-04-21: qty 30, 30d supply, fill #4
  Filled 2024-05-22: qty 30, 30d supply, fill #5

## 2023-12-26 MED ORDER — RIBOCICLIB SUCC (600 MG DOSE) 200 MG PO TBPK: 600.0000 mg | ORAL_TABLET | Freq: Every day | ORAL | 0 refills | Status: DC

## 2023-12-26 NOTE — Telephone Encounter (Signed)
Oral Oncology Patient Advocate Encounter   Received notification that prior authorization for Kristina Nicholson is required.   PA submitted on 12/26/23 Key [id number] Status is pending     Jinger Neighbors, CPhT-Adv Oncology Pharmacy Patient Advocate [facility] Direct Number: [phone]  Fax: [phone]

## 2023-12-26 NOTE — Telephone Encounter (Signed)
Oral Oncology Patient Advocate Encounter  Prior Authorization for Kristina Nicholson has been approved.    PA# [id number] Effective dates: 12/26/23 through 12/25/24  Patient must fill at [facility] .    Jinger Neighbors, CPhT-Adv Oncology Pharmacy Patient Advocate [facility] Direct Number: [phone]  Fax: [phone]  '

## 2023-12-26 NOTE — Progress Notes (Signed)
[facility]   Telephone:(336) [phone] [phone]   Clinic Follow up Note   Patient Care Team: Patient, No Pcp Per as PCP - General (General Practice) Malachy Mood, MD as Consulting Physician (Hematology) Griselda Miner, MD as Consulting Physician (General Surgery) Lonie Peak, MD as Attending Physician (Radiation Oncology) Glee Arvin, NP as Nurse Practitioner (Hospice and Palliative Medicine)  Date of Service:  12/26/2023  CHIEF COMPLAINT: f/u of metastatic breast cancer  CURRENT THERAPY:  Letrozole and Verzenio 100 mg twice daily  Oncology History   Malignant neoplasm of upper-inner quadrant of left breast in female, estrogen receptor positive (HCC) [name], stage IIB, ER: Positive, PR: Negative, HER2: Negative, Grade 3, bone metastasis in 05/2023 -She was diagnosed in 04/2017. She is s/p left breast mastectomy and adjuvant radiation. Mammaprint showed low risk. -She started antiestrogen therapy with Tamoxifen in 10/2017. She is tolerating well, plan for 10 year. She has not had a menstrual period since 2018.  Her FSH in March 2021 showed that she is still premenopausal --Due to worsening left shoulder pain, she underwent CT and bone scan in [place] during her trip. Unfortunately scan showed multiple bone lesion and uptake in sternum, cervical spine, left scapular, left 10th rib, and left hip, concerning for bone mets. She underwent bone biopsy of left scapula and sternum, both confirmed metastatic breast cancer, ER positive (weak to moderate in left scapula biopsy, and 80% strong positive in sternal biopsy), PR weak to moderate positive, HER2 2+ in left scapular, 1+ in sternal bone biopsy -PET scan from June 21, 2023 showed multifocal hypermetabolic osseous metastatic disease. No definite pathologic fracture or epidural tumor identified.  No visceral metastasis.   -she started fulvestrant injection and Verzenio on August 13, 2023.  Due to poor  tolerance to Verzenio, dose was reduced to 100 mg twice daily -She underwent BSO on August 24, 2023, surgical path was benign. -Due to severe right hip pain from bone metastasis, she underwent right hip replacement on September 30, 2023 -She is currently on letrozole and Verzenio 100 mg twice daily with moderate diarrhea -Restaging PET scan November 08, 2023 showed excellent response in bone metastasis.  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   Assessment and Plan    Metastatic Breast Cancer The patient is experiencing significant gastrointestinal side effects from Verzenio, impacting her quality of life. Idelle Jo was chosen over Brumley due to its demonstrated survival benefit, despite potential cardiac side effects, which will be monitored with an EKG. The patient prefers to try Kisqali first, with the option to switch to [place] if not tolerated. - Discontinue Verzenio when she starts Lobbyist 3 tablets once daily for 3 weeks on, 1 week off - Order EKG to monitor cardiac function - Schedule follow-up in one month - Monitor for GI side effects and adjust dosage if necessary - Consider switching to Ibrance if Idelle Jo is not tolerated  Neuropathy Gabapentin is effective for neuropathy-related pain when taken properly. - Continue gabapentin as prescribed  General Health Maintenance Advised to receive the shingles vaccine and maintain physical activity to prevent muscle loss post-surgery. - Administer shingles vaccine - Encourage regular physical activity, including light weight lifting and resistance band exercises  Plan -Due to her GI side effect, I plan to change Verzenio to [employer] 600mg  daily 3 weeks on and one week off  - Schedule follow-up appointment in one month - Perform EKG today, which is  normal  - Monitor for any new symptoms or side effects and report immediately.         SUMMARY OF ONCOLOGIC  HISTORY: Oncology History Overview Note  Cancer Staging Malignant neoplasm of upper-inner quadrant of left breast in female, estrogen receptor positive (HCC) Staging form: Breast, AJCC 8th Edition - Clinical stage from 04/13/2017: Stage IIA (cT2, cN0, cM0, G2, ER: Positive, PR: Negative, HER2: Negative) - Signed by Malachy Mood, MD on 04/25/2017 - Pathologic stage from 06/13/2017: Stage IIB (pT2, pN1a, cM0, G1, ER: Positive, PR: Negative, HER2: Negative) - Signed by Malachy Mood, MD on 09/04/2017     Malignant neoplasm of upper-inner quadrant of left breast in female, estrogen receptor positive (HCC)  04/10/2017 Mammogram   Left breast mammo and US showed an irregular hypoechoic shadowing mass at 9 o'clock 1 cm from the nipple measuring 2.3 x 1 x 2.4 cm. This corresponds well with the mass seen at mammography. No lymphadenopathy seen in the left axilla.   04/18/2017 Initial Diagnosis   Malignant neoplasm of upper-inner quadrant of left breast in female, estrogen receptor positive (HCC)   04/22/2017 Receptors her2   Estrogen Receptor: 95%, POSITIVE, STRONG STAINING INTENSITY Progesterone Receptor: 0%, NEGATIVE Proliferation Marker Ki67: 10% HER2 -   04/22/2017 Initial Biopsy   Breast, left, needle core biopsy, 9:30 o'clock - INVASIVE DUCTAL CARCINOMA, G2 - DUCTAL CARCINOMA IN SITU.   06/13/2017 Surgery   Left breast mastectomy with sentinel lymph node biopsy performed by Dr Carolynne Edouard.    06/13/2017 Pathology Results   Breast, simple mastectomy, Left - INVASIVE DUCTAL CARCINOMA, GRADE I/III, SPANNING 2.5 CM. - DUCTAL CARCINOMA IN SITU, INTERMEDIATE GRADE. - LOBULAR NEOPLASIA (ATYPICAL LOBULAR HYPERPLASIA). - THE SURGICAL RESECTION MARGINS ARE NEGATIVE FOR CARCINOMA.  Lymph node, sentinel, biopsy, Left axillary #1 - METASTATIC CARCINOMA IN 1 OF 3 LYMPH NODE (1/3), WITH EXTRACAPSULAR EXTENSION.   06/13/2017 Pathology Results   Mammaprint with low risk disease.    07/31/2017 - 09/10/2017 Radiation Therapy    The patient began a course of radiotherapy over the left chest wall and axillary area, supervised by Dr Lonie Peak, MD.     Radiation treatment dates:   07/31/2017 - 09/10/2017   Site/dose:    1) Left Chest Wall / 50 Gy in 25 fractions 2) Left Supraclavicular / 45 Gy in 25 fractions 3) Left Chest Wall Scar Boost / 10 Gy in 5 fractions   Beams/energy:    1) Opposed tangents 3D/ 10 and 6 MV photons 2) Additional fields 3D / 10 MV and 6 MV photons 3) En face electrons / 6 MeV electrons   Narrative: The patient tolerated radiation treatment relatively well. She experienced mild fatigue. She reported pain, burning, and itching to the left breast and swelling under the left arm. On physical exam, she had dry peeling in the left UIQ of the breast and left axilla, as well as diffuse hyperpigmentation and erythema of the left chest wall. She is using the radiaplex cream to the treatment area and neosporin to the peeling areas.   10/13/2017 -  Anti-estrogen oral therapy   Tamoxifen 10mg  daily   04/11/2018 Mammogram   04/11/2018 Mammogram IMPRESSION: No mammographic evidence of malignancy. A result letter of this screening mammogram will be mailed directly to the patient.   04/11/2018 Mammogram   Screening Unilateral Right: No evidence of mammographic malignancy.       Discussed the use of AI scribe software for clinical note transcription with the patient, who gave verbal  consent to proceed.  History of Present Illness   A 58 year old patient with a history of metastatic breast cancer presents with stomach pain, which she attributes to her current medication, Verzenio. The pain is severe enough to keep her mostly housebound and affects her eating. She has tried Miralax and Pepcid to alleviate the discomfort, but these have provided only minimal relief. The patient also reports skin itchiness, which is likely due to dryness. The patient's family member notes that the patient has been largely  sedentary since her surgery and expresses concern about her losing muscle mass. The patient confirms that she has been less active than usual.         All other systems were reviewed with the patient and are negative.  MEDICAL HISTORY:  Past Medical History:  Diagnosis Date   Chronic left shoulder pain    Headache    History of external beam radiation therapy    07-31-2017  to  09-10-2017  LEft chest wass 50 gy in 25 fractions, Left Supraclavicular 45 Gy in 25 fractions, Left Chest wall scar boost 10 Gy in 5 fractions.   Horseshoe kidney    Malignant neoplasm of upper-inner quadrant of left breast in female, estrogen receptor positive (HCC) 04/18/2017   oncologist--- dr Mosetta Putt;   dx 06/ 2018 ,  Stage IIB, ER/PR+, G3, IDC/ DCIS;  06-13-2017 s/p left mastectomy w/ node dissection ;  completed radiation 09-10-2017;   recurrence w/ mets to bone 07/ 2024 , started chemotherapy   Metastatic cancer to bone (HCC) 05/2023   Uterine fibroid     SURGICAL HISTORY: Past Surgical History:  Procedure Laterality Date   APPENDECTOMY  2005   COLONOSCOPY  2016   GANGLION CYST EXCISION Right 09/24/2017   Procedure: EXCISION OF RIGHT DORSAL WRIST GANGLION;  Surgeon: Mack Hook, MD;  Location: [facility];  Service: Orthopedics;  Laterality: Right;   LAPAROSCOPIC SALPINGO OOPHERECTOMY Bilateral 08/24/2023   Procedure: LAPAROSCOPIC SALPINGO OOPHORECTOMY;  Surgeon: [name], DO;  Location: [facility];  Service: Gynecology;  Laterality: Bilateral;   LIPOMA EXCISION Left 06/13/2017   Procedure: EXCISION 3CM LIPOMA ON LEFT ARM;  Surgeon: Griselda Miner, MD;  Location: [facility];  Service: General;  Laterality: Left;   MASTECTOMY W/ SENTINEL NODE BIOPSY Left 06/13/2017   Procedure: MASTECTOMY WITH SENTINEL LYMPH NODE BIOPSY;  Surgeon: Griselda Miner, MD;  Location: [facility] OR;  Service: General;  Laterality: Left;   TOTAL HIP ARTHROPLASTY Right 09/30/2023   Procedure: TOTAL HIP  ARTHROPLASTY ANTERIOR APPROACH;  Surgeon: Durene Romans, MD;  Location: [facility];  Service: Orthopedics;  Laterality: Right;    I have reviewed the social history and family history with the patient and they are unchanged from previous note.  ALLERGIES:  has no known allergies.  MEDICATIONS:  Current Outpatient Medications  Medication Sig Dispense Refill   ribociclib succ (KISQALI, 600 MG DOSE,) 200 MG Therapy Pack Take 3 tablets (600 mg total) by mouth daily. Take for 21 days on, 7 days off, repeat every 28 days. 63 tablet 0   acetaminophen (TYLENOL) 500 MG tablet Take 2 tablets (1,000 mg total) by mouth every 6 (six) hours as needed. 30 tablet 0   ferrous sulfate 325 (65 FE) MG tablet Take 1 tablet (325 mg total) by mouth daily with breakfast. 30 tablet 3   gabapentin (NEURONTIN) 300 MG capsule Take 1 capsule (300 mg total) by mouth 3 (three) times daily. 90 capsule 1   letrozole [facility])  2.5 MG tablet Take 1 tablet (2.5 mg total) by mouth daily. 30 tablet 5   lidocaine (LIDODERM) 5 % Place 1 patch onto the skin daily. Remove & Discard patch within 12 hours or as directed by MD 30 patch 0   loperamide (IMODIUM A-D) 2 MG tablet Take 2 mg by mouth 4 (four) times daily as needed for diarrhea or loose stools.     Multiple Vitamins-Minerals (MULTIVITAMIN WOMEN PO) Take 1 tablet by mouth daily with breakfast.     Nutritional Supplements (CHLORELLA-SPIRULINA COMPLEX PO) Take 1 capsule by mouth daily as needed (to aid digestive health).     polyethylene glycol (MIRALAX / GLYCOLAX) 17 g packet Take 17 g by mouth 2 (two) times daily. 14 each 0   senna (SENOKOT) 8.6 MG TABS tablet Take 2 tablets (17.2 mg total) by mouth at bedtime. 120 tablet 0   No current facility-administered medications for this visit.    PHYSICAL EXAMINATION: ECOG PERFORMANCE STATUS: 1 - Symptomatic but completely ambulatory  Vitals:   12/26/23 1016  BP: 129/66  Pulse: 75  Resp: 16  Temp: 97.7 F (36.5 C)  SpO2: 99%    Wt Readings from Last 3 Encounters:  12/26/23 127 lb 6.4 oz (57.8 kg)  11/08/23 124 lb (56.2 kg)  10/15/23 127 lb (57.6 kg)     GENERAL:alert, no distress and comfortable SKIN: skin color, texture, turgor are normal, no rashes or significant lesions EYES: normal, Conjunctiva are pink and non-injected, sclera clear NECK: supple, thyroid normal size, non-tender, without nodularity LYMPH:  no palpable lymphadenopathy in the cervical, axillary  LUNGS: clear to auscultation and percussion with normal breathing effort HEART: regular rate & rhythm and no murmurs and no lower extremity edema ABDOMEN:abdomen soft, non-tender and normal bowel sounds Musculoskeletal:no cyanosis of digits and no clubbing  NEURO: alert & oriented x 3 with fluent speech, no focal motor/sensory deficits      LABORATORY DATA:  I have reviewed the data as listed    Latest Ref Rng & Units 10/15/2023    3:15 PM 10/02/2023    3:50 AM 10/01/2023    3:46 AM  CBC  WBC 4.0 - 10.5 K/uL 5.0  5.5  6.2   Hemoglobin 12.0 - 15.0 g/dL 9.4  8.3  9.3   Hematocrit 36.0 - 46.0 % 28.8  24.7  27.3   Platelets 150 - 400 K/uL 433  167  180         Latest Ref Rng & Units 10/15/2023    3:15 PM 10/01/2023    3:46 AM 09/30/2023    3:55 AM  CMP  Glucose 70 - 99 mg/dL 82  960  85   BUN 6 - 20 mg/dL 12  15  17    Creatinine 0.44 - 1.00 mg/dL 4.54  0.98  1.19   Sodium 135 - 145 mmol/L 140  137  138   Potassium 3.5 - 5.1 mmol/L 3.9  3.7  3.7   Chloride 98 - 111 mmol/L 105  106  106   CO2 22 - 32 mmol/L 29  23  25    Calcium 8.9 - 10.3 mg/dL 9.2  8.5  8.7   Total Protein 6.5 - 8.1 g/dL 7.0   6.5   Total Bilirubin <1.2 mg/dL 0.3   0.6   Alkaline Phos 38 - 126 U/L 69   44   AST 15 - 41 U/L 17   17   ALT 0 - 44 U/L 17   19  RADIOGRAPHIC STUDIES: I have personally reviewed the radiological images as listed and agreed with the findings in the report. No results found.    Orders Placed This Encounter  Procedures   CBC  with Differential/Platelet    Standing Status:   Standing    Number of Occurrences:   50    Expiration Date:   12/25/2024   Comprehensive metabolic panel    Standing Status:   Standing    Number of Occurrences:   50    Expiration Date:   12/25/2024   EKG 12-Lead    Ordered by an unspecified provider    EKG 12-Lead    Ordered by an unspecified provider    All questions were answered. The patient knows to call the clinic with any problems, questions or concerns. No barriers to learning was detected. The total time spent in the appointment was 40 minutes.     Malachy Mood, MD 12/26/2023

## 2023-12-26 NOTE — Telephone Encounter (Signed)
Oral Oncology Pharmacist Encounter  Received new prescription for Kisqali (ribociclib) for the treatment of metastatic breast cancer, ER positive, PR negative, HER-2 negative in conjunction with letrozole, planned duration until disease progression or unacceptable drug toxicity.  CBC w/ Diff and CMP from 12/17/23 assessed, noted WBC of 2.9 K/uL (ANC 1300). No baseline dose adjustments required at this time. Baseline EKG obtained in clinic on 12/26/23, QTcB WNL at 429 ms. Prescription dose and frequency assessed for appropriateness.  Current medication list in Epic reviewed, no relevant/significant DDIs with Kisqali identified.  Evaluated chart and no patient barriers to medication adherence noted.   Patient agreement for treatment documented in MD note on 12/26/23.  Patient's insurance requires Kisqali to be filled through [employer] - prescription has been e-scribed there for dispensing.   Oral Oncology Clinic will continue to follow for insurance authorization, copayment issues, initial counseling and start date.  Sherry Ruffing, PharmD, BCPS, BCOP Hematology/Oncology Clinical Pharmacist [facility] Oral Chemotherapy Navigation Clinics [phone] 12/26/2023 11:26 AM

## 2023-12-26 NOTE — Telephone Encounter (Signed)
Oral Oncology Patient Advocate Encounter   Was successful in obtaining a copay card for Kisqali.  This copay card will make the patients copay $0.  I have spoken with the patient.    The billing information is as follows and has been shared with [facility].   RxBin: [id number] PCN: [id number] Member ID: [id number] Group ID: [id number]   Jinger Neighbors, CPhT-Adv Oncology Pharmacy Patient Advocate [facility] Direct Number: [phone]  Fax: [phone]

## 2023-12-27 LAB — CANCER ANTIGEN 27.29: CA 27.29: 24.5 U/mL (ref 0.0–38.6)

## 2023-12-27 MED ORDER — RIBOCICLIB SUCC (600 MG DOSE) 200 MG PO TBPK: 600.0000 mg | ORAL_TABLET | Freq: Every day | ORAL | 0 refills | Status: DC

## 2023-12-28 ENCOUNTER — Other Ambulatory Visit: Payer: Self-pay

## 2023-12-28 DIAGNOSIS — C50212 Malignant neoplasm of upper-inner quadrant of left female breast: Secondary | ICD-10-CM

## 2023-12-28 MED ORDER — RIBOCICLIB SUCC (600 MG DOSE) 200 MG PO TBPK: 600.0000 mg | ORAL_TABLET | Freq: Every day | ORAL | 0 refills | Status: DC

## 2023-12-30 ENCOUNTER — Other Ambulatory Visit: Payer: Self-pay | Admitting: Hematology

## 2023-12-30 DIAGNOSIS — C50212 Malignant neoplasm of upper-inner quadrant of left female breast: Secondary | ICD-10-CM

## 2023-12-31 ENCOUNTER — Other Ambulatory Visit: Payer: Self-pay

## 2023-12-31 ENCOUNTER — Encounter: Payer: Self-pay | Admitting: Hematology

## 2024-01-01 NOTE — Telephone Encounter (Signed)
Oral Chemotherapy Pharmacist Encounter  I spoke with patient and patient's husband for overview of: Kisqali (ribociclib) for the treatment of metastatic breast cancer, ER positive, PR negative, HER-2 negative in conjunction with letrozole, planned duration until disease progression or unacceptable drug toxicity.   Counseled patient's husband on administration, dosing, side effects, monitoring, drug-food interactions, safe handling, storage, and disposal.  Patient will take Kisqali 200mg  tablets, 3 tablets (600mg ) by mouth once daily without regard to food.  Patient will take Kisqali for 3 weeks on, 1 week off, repeat every 4 weeks.  Patient knows to avoid grapefruit/grapefruit juice and pomegranate/pomegranate juice.  Kisqali start date: 01/04/24  Adverse effects include but are not limited to: nausea, vomiting, diarrhea, constipation, fatigue, rash, decreased blood counts, and altered cardiac conduction. Severe, life-threatening, and/or fatal interstitial lung disease (ILD) and/or pneumonitis may occur with CDK 4/6 inhibitors. Qtc prolongation: patient had baseline EKG obtained (WNL). Patient will need EKG on 01/17/24 (~C1D14) and then repeat EKG on 01/31/24 (~C2D1) per manufacturer recomendations. Patient will obtain anti diarrheal and alert the office of 4 or more loose stools above baseline.  Reviewed importance of keeping a medication schedule and plan for any missed doses. No barriers to medication adherence identified.  Medication reconciliation performed and medication/allergy list updated.  All questions answered.  Patient and patient's husband voiced understanding and appreciation.   Medication education handout placed in mail for patient. Patient knows to call the office with questions or concerns. Oral Chemotherapy Clinic phone number provided to patient.   Sherry Ruffing, PharmD, BCPS, BCOP Hematology/Oncology Clinical Pharmacist [facility] Oral Chemotherapy  Navigation Clinics [phone] 01/01/2024 1:15 PM

## 2024-01-10 ENCOUNTER — Other Ambulatory Visit: Payer: Self-pay

## 2024-01-10 ENCOUNTER — Telehealth: Payer: Self-pay

## 2024-01-10 NOTE — Telephone Encounter (Signed)
 Pt's husband called stating the pt is having more side effects on the new chemo medication (kisqali) and would like to be switched back to [employer].  Pt c/o dysuria, diarrhea last BM 01/08/2024 now c/o constipation not taking anything to promote BM, c/o something stuck in her throat to the point where it feel like frog in throat; c/o chest pain described as tightness in chest; c/o itching but denied rash or hives.  Pt stated she started the kisqali on 01/07/2024 and took last dose on 01/09/2024.  Instructed pt to not take the [facility] today until this nurse speaks with Dr. Mosetta Putt regarding pt's symptoms.  Instructed pt's husband to give pt Diphenhydramine 25mg  PO BID or Claritin 10mg  BID, and Famotidine 20mg  PO BID for 4 to 5 days to see if pt's symptoms improve.  Consulted with Dr. Mosetta Putt while pt and spouse were on phone and verbal order given by Dr. Mosetta Putt to stop Kisqali and for pt to take Diphenhydramine & Famotidine as recommended by this nurse.  Pt and spouse verbalized understanding and agreed with plan.  Pt is scheduled to return to clinic to see Dr. Mosetta Putt on 01/14/2024 at which time Dr. Mosetta Putt will discuss changes in oral chemo medications.  Pt stated she would like to be switched back to Verzenio if possible.  Stated this nurse will make Dr. Mosetta Putt aware.

## 2024-01-13 NOTE — Assessment & Plan Note (Addendum)
 [name], stage IIB, ER: Positive, PR: Negative, HER2: Negative, Grade 3, bone metastasis in 05/2023 -She was diagnosed in 04/2017. She is s/p left breast mastectomy and adjuvant radiation. Mammaprint showed low risk. -She started antiestrogen therapy with Tamoxifen in 10/2017. She is tolerating well, plan for 10 year. She has not had a menstrual period since 2018.  Her FSH in March 2021 showed that she is still premenopausal --Due to worsening left shoulder pain, she underwent CT and bone scan in [place] during her trip. Unfortunately scan showed multiple bone lesion and uptake in sternum, cervical spine, left scapular, left 10th rib, and left hip, concerning for bone mets. She underwent bone biopsy of left scapula and sternum, both confirmed metastatic breast cancer, ER positive (weak to moderate in left scapula biopsy, and 80% strong positive in sternal biopsy), PR weak to moderate positive, HER2 2+ in left scapular, 1+ in sternal bone biopsy -PET scan from June 21, 2023 showed multifocal hypermetabolic osseous metastatic disease. No definite pathologic fracture or epidural tumor identified.  No visceral metastasis.   -she started fulvestrant injection and Verzenio on August 13, 2023.  Due to poor tolerance to Verzenio, dose was reduced to 100 mg twice daily -She underwent BSO on August 24, 2023, surgical path was benign. -Due to severe right hip pain from bone metastasis, she underwent right hip replacement on September 30, 2023 -She is currently on letrozole and Verzenio 100 mg twice daily with moderate diarrhea -Restaging PET scan November 08, 2023 showed excellent response in bone metastasis.  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 -Due to her GI side effect, I changed Verzenio to [employer] 600mg  daily 3 weeks on and one week off in mid Feb 2025

## 2024-01-14 ENCOUNTER — Encounter: Payer: Self-pay | Admitting: Hematology

## 2024-01-14 ENCOUNTER — Other Ambulatory Visit: Payer: Self-pay

## 2024-01-14 ENCOUNTER — Telehealth: Payer: Self-pay | Admitting: Pharmacy Technician

## 2024-01-14 ENCOUNTER — Other Ambulatory Visit (HOSPITAL_COMMUNITY): Payer: Self-pay

## 2024-01-14 ENCOUNTER — Telehealth: Payer: Self-pay | Admitting: Pharmacist

## 2024-01-14 ENCOUNTER — Inpatient Hospital Stay: Payer: BC Managed Care – PPO | Attending: Hematology

## 2024-01-14 ENCOUNTER — Inpatient Hospital Stay (HOSPITAL_BASED_OUTPATIENT_CLINIC_OR_DEPARTMENT_OTHER): Payer: BC Managed Care – PPO | Admitting: Hematology

## 2024-01-14 DIAGNOSIS — Z9012 Acquired absence of left breast and nipple: Secondary | ICD-10-CM | POA: Insufficient documentation

## 2024-01-14 DIAGNOSIS — Z170 Estrogen receptor positive status [ER+]: Secondary | ICD-10-CM

## 2024-01-14 DIAGNOSIS — C50212 Malignant neoplasm of upper-inner quadrant of left female breast: Secondary | ICD-10-CM

## 2024-01-14 DIAGNOSIS — Z923 Personal history of irradiation: Secondary | ICD-10-CM | POA: Insufficient documentation

## 2024-01-14 DIAGNOSIS — Z9221 Personal history of antineoplastic chemotherapy: Secondary | ICD-10-CM | POA: Diagnosis not present

## 2024-01-14 DIAGNOSIS — C7951 Secondary malignant neoplasm of bone: Secondary | ICD-10-CM | POA: Diagnosis not present

## 2024-01-14 DIAGNOSIS — Z79811 Long term (current) use of aromatase inhibitors: Secondary | ICD-10-CM | POA: Insufficient documentation

## 2024-01-14 LAB — CBC WITH DIFFERENTIAL/PLATELET
Abs Immature Granulocytes: 0.01 10*3/uL (ref 0.00–0.07)
Basophils Absolute: 0 10*3/uL (ref 0.0–0.1)
Basophils Relative: 1 %
Eosinophils Absolute: 0.1 10*3/uL (ref 0.0–0.5)
Eosinophils Relative: 2 %
HCT: 34.3 % — ABNORMAL LOW (ref 36.0–46.0)
Hemoglobin: 11.3 g/dL — ABNORMAL LOW (ref 12.0–15.0)
Immature Granulocytes: 0 %
Lymphocytes Relative: 41 %
Lymphs Abs: 1 10*3/uL (ref 0.7–4.0)
MCH: 32.7 pg (ref 26.0–34.0)
MCHC: 32.9 g/dL (ref 30.0–36.0)
MCV: 99.1 fL (ref 80.0–100.0)
Monocytes Absolute: 0.2 10*3/uL (ref 0.1–1.0)
Monocytes Relative: 8 %
Neutro Abs: 1.2 10*3/uL — ABNORMAL LOW (ref 1.7–7.7)
Neutrophils Relative %: 48 %
Platelets: 228 10*3/uL (ref 150–400)
RBC: 3.46 MIL/uL — ABNORMAL LOW (ref 3.87–5.11)
RDW: 14.6 % (ref 11.5–15.5)
WBC: 2.5 10*3/uL — ABNORMAL LOW (ref 4.0–10.5)
nRBC: 0 % (ref 0.0–0.2)

## 2024-01-14 LAB — COMPREHENSIVE METABOLIC PANEL
ALT: 8 U/L (ref 0–44)
AST: 11 U/L — ABNORMAL LOW (ref 15–41)
Albumin: 4 g/dL (ref 3.5–5.0)
Alkaline Phosphatase: 49 U/L (ref 38–126)
Anion gap: 7 (ref 5–15)
BUN: 10 mg/dL (ref 6–20)
CO2: 30 mmol/L (ref 22–32)
Calcium: 9.1 mg/dL (ref 8.9–10.3)
Chloride: 104 mmol/L (ref 98–111)
Creatinine, Ser: 0.8 mg/dL (ref 0.44–1.00)
GFR, Estimated: >60 mL/min (ref >60)
Glucose, Bld: 140 mg/dL — ABNORMAL HIGH (ref 70–99)
Potassium: 3.5 mmol/L (ref 3.5–5.1)
Sodium: 141 mmol/L (ref 135–145)
Total Bilirubin: 0.3 mg/dL (ref 0.0–1.2)
Total Protein: 6.8 g/dL (ref 6.5–8.1)

## 2024-01-14 MED ORDER — PALBOCICLIB 100 MG PO TABS: 100.0000 mg | ORAL_TABLET | Freq: Every day | ORAL | 0 refills | Status: DC

## 2024-01-14 NOTE — Progress Notes (Signed)
 [facility]   Telephone:([phone] [phone]   Clinic Follow up Note   Patient Care Team: Patient, No Pcp Per as PCP - General (General Practice) Malachy Mood, MD as Consulting Physician (Hematology) Griselda Miner, MD as Consulting Physician (General Surgery) Lonie Peak, MD as Attending Physician (Radiation Oncology) Glee Arvin, NP as Nurse Practitioner (Hospice and Palliative Medicine)  Date of Service:  01/14/2024  CHIEF COMPLAINT: f/u of metastatic breast cancer  CURRENT THERAPY:  Letrozole   Oncology History   Malignant neoplasm of upper-inner quadrant of left breast in female, estrogen receptor positive (HCC) [name], stage IIB, ER: Positive, PR: Negative, HER2: Negative, Grade 3, bone metastasis in 05/2023 -She was diagnosed in 04/2017. She is s/p left breast mastectomy and adjuvant radiation. Mammaprint showed low risk. -She started antiestrogen therapy with Tamoxifen in 10/2017. She is tolerating well, plan for 10 year. She has not had a menstrual period since 2018.  Her FSH in March 2021 showed that she is still premenopausal --Due to worsening left shoulder pain, she underwent CT and bone scan in [place] during her trip. Unfortunately scan showed multiple bone lesion and uptake in sternum, cervical spine, left scapular, left 10th rib, and left hip, concerning for bone mets. She underwent bone biopsy of left scapula and sternum, both confirmed metastatic breast cancer, ER positive (weak to moderate in left scapula biopsy, and 80% strong positive in sternal biopsy), PR weak to moderate positive, HER2 2+ in left scapular, 1+ in sternal bone biopsy -PET scan from June 21, 2023 showed multifocal hypermetabolic osseous metastatic disease. No definite pathologic fracture or epidural tumor identified.  No visceral metastasis.   -she started fulvestrant injection and Verzenio on August 13, 2023.  Due to poor tolerance to Verzenio, dose was  reduced to 100 mg twice daily -She underwent BSO on August 24, 2023, surgical path was benign. -Due to severe right hip pain from bone metastasis, she underwent right hip replacement on September 30, 2023 -She is currently on letrozole and Verzenio 100 mg twice daily with moderate diarrhea -Restaging PET scan November 08, 2023 showed excellent response in bone metastasis.  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 -Due to her GI side effect, I changed Verzenio to [employer] 600mg  daily 3 weeks on and one week off in mid Feb 2025   Assessment and Plan    Metastatic Breast Cancer Experiencing side effects from Kisqali, including throat discomfort, slow urination, and back itching. Previously tried [employer] but had significant GI side effects. Discussed switching to [facility], which has fewer GI side effects but may cause neutropenia. [name] has similar efficacy in delaying disease progression with better tolerability. The goal is to delay chemotherapy and manage disease progression with tolerable side effects. Emphasized the importance of monitoring blood counts and infection risk. Explained Ibrance regimen: 21 days on, 7 days off, with dose adjustments based on tolerance. - Discontinue Kisqali - Start Ibrance 100 mg daily, 21 days on, 7 days off - Monitor blood counts and side effects - Schedule bone scan every 3-6 months - Monitor tumor markers with blood tests  Bone Metastasis Due for Zometa infusion to strengthen bones and prevent fractures. Emphasized the importance of dental care to prevent complications such as osteonecrosis of the jaw. Discussed routine dental check-ups and cleanings every 6 months. - Schedule Zometa infusion in 3 weeks - Encourage routine dental care and cleaning every 6 months - Provide dental clearance form  for dentist  General Health Maintenance Advised to maintain good dental hygiene and get routine dental  check-ups to prevent complications from Zometa. Advised to take calcium and vitamin D supplements to support bone health. Discussed dietary modifications to minimize GI side effects. - Take calcium and vitamin D supplements - Encourage routine dental check-ups and cleanings - Avoid spicy and hard-to-digest foods to minimize GI side effects   Plan -Due to throat tightness and urinary symptoms, she has stopped Kisqali -I called in Ibrance 100 mg daily, 3 weeks on and 1 week off for her, she will start when she received the medicine. - Follow up in 3-4 weeks -Zometa on next visit, I gave her the letter of dental clearance.  I encouraged her to call a dentist to have a dental cleaning and exam.  I give her the number of [facility] family [facility] to call         SUMMARY OF ONCOLOGIC HISTORY: Oncology History Overview Note  Cancer Staging Malignant neoplasm of upper-inner quadrant of left breast in female, estrogen receptor positive (HCC) Staging form: Breast, AJCC 8th Edition - Clinical stage from 04/13/2017: Stage IIA (cT2, cN0, cM0, G2, ER: Positive, PR: Negative, HER2: Negative) - Signed by Malachy Mood, MD on 04/25/2017 - Pathologic stage from 06/13/2017: Stage IIB (pT2, pN1a, cM0, G1, ER: Positive, PR: Negative, HER2: Negative) - Signed by Malachy Mood, MD on 09/04/2017     Malignant neoplasm of upper-inner quadrant of left breast in female, estrogen receptor positive (HCC)  04/10/2017 Mammogram   Left breast mammo and US showed an irregular hypoechoic shadowing mass at 9 o'clock 1 cm from the nipple measuring 2.3 x 1 x 2.4 cm. This corresponds well with the mass seen at mammography. No lymphadenopathy seen in the left axilla.   04/18/2017 Initial Diagnosis   Malignant neoplasm of upper-inner quadrant of left breast in female, estrogen receptor positive (HCC)   04/22/2017 Receptors her2   Estrogen Receptor: 95%, POSITIVE, STRONG STAINING INTENSITY Progesterone Receptor: 0%, NEGATIVE Proliferation  Marker Ki67: 10% HER2 -   04/22/2017 Initial Biopsy   Breast, left, needle core biopsy, 9:30 o'clock - INVASIVE DUCTAL CARCINOMA, G2 - DUCTAL CARCINOMA IN SITU.   06/13/2017 Surgery   Left breast mastectomy with sentinel lymph node biopsy performed by Dr Carolynne Edouard.    06/13/2017 Pathology Results   Breast, simple mastectomy, Left - INVASIVE DUCTAL CARCINOMA, GRADE I/III, SPANNING 2.5 CM. - DUCTAL CARCINOMA IN SITU, INTERMEDIATE GRADE. - LOBULAR NEOPLASIA (ATYPICAL LOBULAR HYPERPLASIA). - THE SURGICAL RESECTION MARGINS ARE NEGATIVE FOR CARCINOMA.  Lymph node, sentinel, biopsy, Left axillary #1 - METASTATIC CARCINOMA IN 1 OF 3 LYMPH NODE (1/3), WITH EXTRACAPSULAR EXTENSION.   06/13/2017 Pathology Results   Mammaprint with low risk disease.    07/31/2017 - 09/10/2017 Radiation Therapy   The patient began a course of radiotherapy over the left chest wall and axillary area, supervised by Dr Lonie Peak, MD.     Radiation treatment dates:   07/31/2017 - 09/10/2017   Site/dose:    1) Left Chest Wall / 50 Gy in 25 fractions 2) Left Supraclavicular / 45 Gy in 25 fractions 3) Left Chest Wall Scar Boost / 10 Gy in 5 fractions   Beams/energy:    1) Opposed tangents 3D/ 10 and 6 MV photons 2) Additional fields 3D / 10 MV and 6 MV photons 3) En face electrons / 6 MeV electrons   Narrative: The patient tolerated radiation treatment relatively well. She experienced mild fatigue. She reported  pain, burning, and itching to the left breast and swelling under the left arm. On physical exam, she had dry peeling in the left UIQ of the breast and left axilla, as well as diffuse hyperpigmentation and erythema of the left chest wall. She is using the radiaplex cream to the treatment area and neosporin to the peeling areas.   10/13/2017 -  Anti-estrogen oral therapy   Tamoxifen 10mg  daily   04/11/2018 Mammogram   04/11/2018 Mammogram IMPRESSION: No mammographic evidence of malignancy. A result letter of  this screening mammogram will be mailed directly to the patient.   04/11/2018 Mammogram   Screening Unilateral Right: No evidence of mammographic malignancy.       Discussed the use of AI scribe software for clinical note transcription with the patient, who gave verbal consent to proceed.  History of Present Illness   A 57 year old female with metastatic breast cancer presents with difficulty tolerating her current medication, Kisqali. She reports experiencing gastrointestinal side effects, which have significantly impacted her quality of life. The patient has previously tried [employer], another medication in the same family, but also experienced similar side effects. The patient's husband is actively involved in her care and expresses concerns about the potential side effects of the new medication being considered, Ibrance. The patient also mentions a skin reaction, specifically itching, which she believes may be related to her medication. She has been advised to use a non-synthetic body lotion to manage this symptom. The patient also reports having received a bone-strengthening infusion, Zometa, in September of the previous year.         All other systems were reviewed with the patient and are negative.  MEDICAL HISTORY:  Past Medical History:  Diagnosis Date   Chronic left shoulder pain    Headache    History of external beam radiation therapy    07-31-2017  to  09-10-2017  LEft chest wass 50 gy in 25 fractions, Left Supraclavicular 45 Gy in 25 fractions, Left Chest wall scar boost 10 Gy in 5 fractions.   Horseshoe kidney    Malignant neoplasm of upper-inner quadrant of left breast in female, estrogen receptor positive (HCC) 04/18/2017   oncologist--- dr Mosetta Putt;   dx 06/ 2018 ,  Stage IIB, ER/PR+, G3, IDC/ DCIS;  06-13-2017 s/p left mastectomy w/ node dissection ;  completed radiation 09-10-2017;   recurrence w/ mets to bone 07/ 2024 , started chemotherapy   Metastatic cancer to bone  (HCC) 05/2023   Uterine fibroid     SURGICAL HISTORY: Past Surgical History:  Procedure Laterality Date   APPENDECTOMY  2005   COLONOSCOPY  2016   GANGLION CYST EXCISION Right 09/24/2017   Procedure: EXCISION OF RIGHT DORSAL WRIST GANGLION;  Surgeon: Mack Hook, MD;  Location: [facility];  Service: Orthopedics;  Laterality: Right;   LAPAROSCOPIC SALPINGO OOPHERECTOMY Bilateral 08/24/2023   Procedure: LAPAROSCOPIC SALPINGO OOPHORECTOMY;  Surgeon: [name], DO;  Location: [facility];  Service: Gynecology;  Laterality: Bilateral;   LIPOMA EXCISION Left 06/13/2017   Procedure: EXCISION 3CM LIPOMA ON LEFT ARM;  Surgeon: Griselda Miner, MD;  Location: [facility] OR;  Service: General;  Laterality: Left;   MASTECTOMY W/ SENTINEL NODE BIOPSY Left 06/13/2017   Procedure: MASTECTOMY WITH SENTINEL LYMPH NODE BIOPSY;  Surgeon: Griselda Miner, MD;  Location: [facility] OR;  Service: General;  Laterality: Left;   TOTAL HIP ARTHROPLASTY Right 09/30/2023   Procedure: TOTAL HIP ARTHROPLASTY ANTERIOR APPROACH;  Surgeon: Durene Romans, MD;  Location:  [facility];  Service: Orthopedics;  Laterality: Right;    I have reviewed the social history and family history with the patient and they are unchanged from previous note.  ALLERGIES:  has no known allergies.  MEDICATIONS:  Current Outpatient Medications  Medication Sig Dispense Refill   palbociclib (IBRANCE) 100 MG tablet Take 1 tablet (100 mg total) by mouth daily. Take for 21 days on, 7 days off, repeat every 28 days. 21 tablet 0   acetaminophen (TYLENOL) 500 MG tablet Take 2 tablets (1,000 mg total) by mouth every 6 (six) hours as needed. 30 tablet 0   ferrous sulfate 325 (65 FE) MG tablet Take 1 tablet (325 mg total) by mouth daily with breakfast. 30 tablet 3   gabapentin (NEURONTIN) 300 MG capsule Take 1 capsule (300 mg total) by mouth 3 (three) times daily. 90 capsule 1   letrozole (FEMARA) 2.5 MG tablet Take 1 tablet (2.5 mg  total) by mouth daily. 30 tablet 5   lidocaine (LIDODERM) 5 % Place 1 patch onto the skin daily. Remove & Discard patch within 12 hours or as directed by MD 30 patch 0   loperamide (IMODIUM A-D) 2 MG tablet Take 2 mg by mouth 4 (four) times daily as needed for diarrhea or loose stools.     Multiple Vitamins-Minerals (MULTIVITAMIN WOMEN PO) Take 1 tablet by mouth daily with breakfast.     Nutritional Supplements (CHLORELLA-SPIRULINA COMPLEX PO) Take 1 capsule by mouth daily as needed (to aid digestive health).     polyethylene glycol (MIRALAX / GLYCOLAX) 17 g packet Take 17 g by mouth 2 (two) times daily. 14 each 0   ribociclib succ (KISQALI, 600 MG DOSE,) 200 MG Therapy Pack Take 3 tablets (600 mg total) by mouth daily. Take for 21 days on, 7 days off, repeat every 28 days. 63 tablet 0   senna (SENOKOT) 8.6 MG TABS tablet Take 2 tablets (17.2 mg total) by mouth at bedtime. 120 tablet 0   No current facility-administered medications for this visit.    PHYSICAL EXAMINATION: ECOG PERFORMANCE STATUS: 1 - Symptomatic but completely ambulatory  Vitals:   01/14/24 0815  BP: 110/74  Pulse: 70  Resp: 15  Temp: 97.6 F (36.4 C)  SpO2: 99%   Wt Readings from Last 3 Encounters:  01/14/24 130 lb 3.2 oz (59.1 kg)  12/26/23 127 lb 6.4 oz (57.8 kg)  11/08/23 124 lb (56.2 kg)     GENERAL:alert, no distress and comfortable SKIN: skin color, texture, turgor are normal, no rashes or significant lesions EYES: normal, Conjunctiva are pink and non-injected, sclera clear NECK: supple, thyroid normal size, non-tender, without nodularity LYMPH:  no palpable lymphadenopathy in the cervical, axillary  LUNGS: clear to auscultation and percussion with normal breathing effort HEART: regular rate & rhythm and no murmurs and no lower extremity edema ABDOMEN:abdomen soft, non-tender and normal bowel sounds Musculoskeletal:no cyanosis of digits and no clubbing  NEURO: alert & oriented x 3 with fluent speech, no  focal motor/sensory deficits   LABORATORY DATA:  I have reviewed the data as listed    Latest Ref Rng & Units 01/14/2024    7:53 AM 10/15/2023    3:15 PM 10/02/2023    3:50 AM  CBC  WBC 4.0 - 10.5 K/uL 2.5  5.0  5.5   Hemoglobin 12.0 - 15.0 g/dL 40.9  9.4  8.3   Hematocrit 36.0 - 46.0 % 34.3  28.8  24.7   Platelets 150 - 400 K/uL 228  433  167         Latest Ref Rng & Units 01/14/2024    7:53 AM 10/15/2023    3:15 PM 10/01/2023    3:46 AM  CMP  Glucose 70 - 99 mg/dL 161  82  096   BUN 6 - 20 mg/dL 10  12  15    Creatinine 0.44 - 1.00 mg/dL 0.45  4.09  8.11   Sodium 135 - 145 mmol/L 141  140  137   Potassium 3.5 - 5.1 mmol/L 3.5  3.9  3.7   Chloride 98 - 111 mmol/L 104  105  106   CO2 22 - 32 mmol/L 30  29  23    Calcium 8.9 - 10.3 mg/dL 9.1  9.2  8.5   Total Protein 6.5 - 8.1 g/dL 6.8  7.0    Total Bilirubin 0.0 - 1.2 mg/dL 0.3  0.3    Alkaline Phos 38 - 126 U/L 49  69    AST 15 - 41 U/L 11  17    ALT 0 - 44 U/L 8  17        RADIOGRAPHIC STUDIES: I have personally reviewed the radiological images as listed and agreed with the findings in the report. No results found.    No orders of the defined types were placed in this encounter.  All questions were answered. The patient knows to call the clinic with any problems, questions or concerns. No barriers to learning was detected. The total time spent in the appointment was 40 minutes.     Malachy Mood, MD 01/14/2024

## 2024-01-14 NOTE — Telephone Encounter (Signed)
 Oral Oncology Pharmacist Encounter  Received new prescription for Ibrance (palbociclib) for the treatment of metastatic breat cancer, ER positive, PR negative, HER-2 negative in conjunction with letrozole, planned duration until disease progression or unacceptable drug toxicity. Of note - patient recently started on Kisqali, but this is being d/c'd due to intolerance.   CBC w/ Diff and CMP from 01/14/24 assessed, noted WBC 2.5 K/uL (ANC 1200). Prescription dose and frequency assessed for appropriateness.  Current medication list in Epic reviewed, no relevant/significant DDIs with Ibrance identified.  Evaluated chart and no patient barriers to medication adherence noted.   Patient agreement for treatment documented in MD note on 01/14/24.  Patient's insurance requires that [name] be filled through [employer].   Oral Oncology Clinic will continue to follow for insurance authorization, copayment issues, initial counseling and start date.  Sherry Ruffing, PharmD, BCPS, BCOP Hematology/Oncology Clinical Pharmacist [facility] Oral Chemotherapy Navigation Clinics [phone] 01/14/2024 9:44 AM

## 2024-01-14 NOTE — Telephone Encounter (Signed)
 Oral Oncology Patient Advocate Encounter   Received notification that prior authorization for Kristina Nicholson is required.   PA submitted on 01/14/24 Key [id number] Status is pending     Markus Jarvis, CPHT Oncology pharmacy patient advocate [facility] Phone [phone] Fax [phone]

## 2024-01-14 NOTE — Telephone Encounter (Signed)
 Oral Oncology Patient Advocate Encounter  Prior Authorization for Kristina Nicholson  has been approved.    PA# [id number] Effective dates: 01/14/24 through 01/13/25  Patient must fill at [facility]    Markus Jarvis, CPHT Oncology pharmacy patient advocate [facility] Phone [phone] Fax [phone]

## 2024-01-14 NOTE — Telephone Encounter (Signed)
 Oral Oncology Patient Advocate Encounter   Was successful in obtaining a copay card for Ibrance.  This copay card will make the patients copay $0.  I have spoken with the patient.    The billing information is as follows and has been shared with [facility].   RxBin: [id number] PCN: [id number] Member ID: [id number] Group ID: [id number]   Markus Jarvis, CPHT Oncology pharmacy patient advocate [facility] Phone [phone] Fax [phone]

## 2024-01-15 ENCOUNTER — Telehealth: Payer: Self-pay | Admitting: Hematology

## 2024-01-15 NOTE — Telephone Encounter (Signed)
 Left patient a voicemail in regards to scheduled appointment times/dates; left patient callback number for scheduling line if needing to cancel or reschedule future appointment

## 2024-01-16 NOTE — Telephone Encounter (Signed)
 Oral Chemotherapy Pharmacist Encounter  I spoke with patient's husband, Sophi Calligan, for overview of: Ibrance (palbociclib) for the treatment of metastatic breat cancer, ER positive, PR negative, HER-2 negative in conjunction with letrozole, planned duration until disease progression or unacceptable drug toxicity.   Counseled on administration, dosing, side effects, monitoring, drug-food interactions, safe handling, storage, and disposal.  Patient will take Ibrance 100mg  tablets, 1 tablet by mouth once daily, with or without food, taken for 3 weeks on, 1 week off, and repeated.  Patient's husband aware that patient will need to avoid grapefruit and grapefruit juice while on treatment with Ibrance.  Ibrance start date: 01/21/24 (medication delivering to patient's home on 01/17/24)  Adverse effects include but are not limited to: fatigue, GI upset, nausea, decreased blood counts. Severe, life-threatening, and/or fatal interstitial lung disease (ILD) and/or pneumonitis may occur with CDK 4/6 inhibitors. Diarrhea: Patient will obtain anti diarrheal and alert the office of 4 or more loose stools above baseline.  Reviewed importance of keeping a medication schedule and plan for any missed doses. No barriers to medication adherence identified.  Medication reconciliation performed and medication/allergy list updated.  All questions answered.  Mr. Soberanes voiced understanding and appreciation.   Medication education handout placed in mail for patient and patient's husband. Patient's husband knows to call the office with questions or concerns. Oral Chemotherapy Clinic phone number provided.   Sherry Ruffing, PharmD, BCPS, BCOP Hematology/Oncology Clinical Pharmacist [facility] Oral Chemotherapy Navigation Clinics [phone] 01/16/2024 9:49 AM

## 2024-01-21 ENCOUNTER — Other Ambulatory Visit (HOSPITAL_COMMUNITY): Payer: Self-pay

## 2024-01-28 ENCOUNTER — Other Ambulatory Visit: Payer: Self-pay | Admitting: Hematology

## 2024-02-04 ENCOUNTER — Other Ambulatory Visit: Payer: Self-pay | Admitting: Nurse Practitioner

## 2024-02-04 DIAGNOSIS — Z515 Encounter for palliative care: Secondary | ICD-10-CM

## 2024-02-04 DIAGNOSIS — C7951 Secondary malignant neoplasm of bone: Secondary | ICD-10-CM

## 2024-02-04 DIAGNOSIS — G893 Neoplasm related pain (acute) (chronic): Secondary | ICD-10-CM

## 2024-02-05 MED ORDER — GABAPENTIN 300 MG PO CAPS
300.0000 mg | ORAL_CAPSULE | Freq: Three times a day (TID) | ORAL | 1 refills | Status: DC
  Filled 2024-02-05: qty 90, 30d supply, fill #0
  Filled 2024-03-11: qty 90, 30d supply, fill #1

## 2024-02-06 ENCOUNTER — Other Ambulatory Visit (HOSPITAL_COMMUNITY): Payer: Self-pay

## 2024-02-07 ENCOUNTER — Other Ambulatory Visit (HOSPITAL_COMMUNITY): Payer: Self-pay

## 2024-02-09 NOTE — Progress Notes (Unsigned)
 Patient Care Team: Patient, No Pcp Per as PCP - General (General Practice) Malachy Mood, MD as Consulting Physician (Hematology) Griselda Miner, MD as Consulting Physician (General Surgery) Lonie Peak, MD as Attending Physician (Radiation Oncology) Pickenpack-Cousar, Arty Baumgartner, NP as Nurse Practitioner (Hospice and Palliative Medicine)   CHIEF COMPLAINT: Follow up metastatic breast cancer   Oncology History Overview Note  Cancer Staging Malignant neoplasm of upper-inner quadrant of left breast in female, estrogen receptor positive (HCC) Staging form: Breast, AJCC 8th Edition - Clinical stage from 04/13/2017: Stage IIA (cT2, cN0, cM0, G2, ER: Positive, PR: Negative, HER2: Negative) - Signed by Malachy Mood, MD on 04/25/2017 - Pathologic stage from 06/13/2017: Stage IIB (pT2, pN1a, cM0, G1, ER: Positive, PR: Negative, HER2: Negative) - Signed by Malachy Mood, MD on 09/04/2017     Malignant neoplasm of upper-inner quadrant of left breast in female, estrogen receptor positive (HCC)  04/10/2017 Mammogram   Left breast mammo and US showed an irregular hypoechoic shadowing mass at 9 o'clock 1 cm from the nipple measuring 2.3 x 1 x 2.4 cm. This corresponds well with the mass seen at mammography. No lymphadenopathy seen in the left axilla.   04/18/2017 Initial Diagnosis   Malignant neoplasm of upper-inner quadrant of left breast in female, estrogen receptor positive (HCC)   04/22/2017 Receptors her2   Estrogen Receptor: 95%, POSITIVE, STRONG STAINING INTENSITY Progesterone Receptor: 0%, NEGATIVE Proliferation Marker Ki67: 10% HER2 -   04/22/2017 Initial Biopsy   Breast, left, needle core biopsy, 9:30 o'clock - INVASIVE DUCTAL CARCINOMA, G2 - DUCTAL CARCINOMA IN SITU.   06/13/2017 Surgery   Left breast mastectomy with sentinel lymph node biopsy performed by Dr Carolynne Edouard.    06/13/2017 Pathology Results   Breast, simple mastectomy, Left - INVASIVE DUCTAL CARCINOMA, GRADE I/III, SPANNING 2.5 CM. - DUCTAL  CARCINOMA IN SITU, INTERMEDIATE GRADE. - LOBULAR NEOPLASIA (ATYPICAL LOBULAR HYPERPLASIA). - THE SURGICAL RESECTION MARGINS ARE NEGATIVE FOR CARCINOMA.  Lymph node, sentinel, biopsy, Left axillary #1 - METASTATIC CARCINOMA IN 1 OF 3 LYMPH NODE (1/3), WITH EXTRACAPSULAR EXTENSION.   06/13/2017 Pathology Results   Mammaprint with low risk disease.    07/31/2017 - 09/10/2017 Radiation Therapy   The patient began a course of radiotherapy over the left chest wall and axillary area, supervised by Dr Lonie Peak, MD.     Radiation treatment dates:   07/31/2017 - 09/10/2017   Site/dose:    1) Left Chest Wall / 50 Gy in 25 fractions 2) Left Supraclavicular / 45 Gy in 25 fractions 3) Left Chest Wall Scar Boost / 10 Gy in 5 fractions   Beams/energy:    1) Opposed tangents 3D/ 10 and 6 MV photons 2) Additional fields 3D / 10 MV and 6 MV photons 3) En face electrons / 6 MeV electrons   Narrative: The patient tolerated radiation treatment relatively well. She experienced mild fatigue. She reported pain, burning, and itching to the left breast and swelling under the left arm. On physical exam, she had dry peeling in the left UIQ of the breast and left axilla, as well as diffuse hyperpigmentation and erythema of the left chest wall. She is using the radiaplex cream to the treatment area and neosporin to the peeling areas.   10/13/2017 -  Anti-estrogen oral therapy   Tamoxifen 10mg  daily   04/11/2018 Mammogram   04/11/2018 Mammogram IMPRESSION: No mammographic evidence of malignancy. A result letter of this screening mammogram will be mailed directly to the patient.   04/11/2018  Mammogram   Screening Unilateral Right: No evidence of mammographic malignancy.       CURRENT THERAPY: Ibrance starting 01/2024, and daily letrozole   INTERVAL HISTORY Kristina Nicholson returns for follow up as scheduled. Last seen by Dr. Mosetta Putt 01/14/24 and began [place] shortly after.   ROS   Past Medical History:   Diagnosis Date   Chronic left shoulder pain    Headache    History of external beam radiation therapy    07-31-2017  to  09-10-2017  LEft chest wass 50 gy in 25 fractions, Left Supraclavicular 45 Gy in 25 fractions, Left Chest wall scar boost 10 Gy in 5 fractions.   Horseshoe kidney    Malignant neoplasm of upper-inner quadrant of left breast in female, estrogen receptor positive (HCC) 04/18/2017   oncologist--- dr Mosetta Putt;   dx 06/ 2018 ,  Stage IIB, ER/PR+, G3, IDC/ DCIS;  06-13-2017 s/p left mastectomy w/ node dissection ;  completed radiation 09-10-2017;   recurrence w/ mets to bone 07/ 2024 , started chemotherapy   Metastatic cancer to bone (HCC) 05/2023   Uterine fibroid      Past Surgical History:  Procedure Laterality Date   APPENDECTOMY  2005   COLONOSCOPY  2016   GANGLION CYST EXCISION Right 09/24/2017   Procedure: EXCISION OF RIGHT DORSAL WRIST GANGLION;  Surgeon: Mack Hook, MD;  Location: [facility];  Service: Orthopedics;  Laterality: Right;   LAPAROSCOPIC SALPINGO OOPHERECTOMY Bilateral 08/24/2023   Procedure: LAPAROSCOPIC SALPINGO OOPHORECTOMY;  Surgeon: [name], DO;  Location: [facility];  Service: Gynecology;  Laterality: Bilateral;   LIPOMA EXCISION Left 06/13/2017   Procedure: EXCISION 3CM LIPOMA ON LEFT ARM;  Surgeon: Griselda Miner, MD;  Location: [facility] OR;  Service: General;  Laterality: Left;   MASTECTOMY W/ SENTINEL NODE BIOPSY Left 06/13/2017   Procedure: MASTECTOMY WITH SENTINEL LYMPH NODE BIOPSY;  Surgeon: Griselda Miner, MD;  Location: [facility] OR;  Service: General;  Laterality: Left;   TOTAL HIP ARTHROPLASTY Right 09/30/2023   Procedure: TOTAL HIP ARTHROPLASTY ANTERIOR APPROACH;  Surgeon: Durene Romans, MD;  Location: [facility];  Service: Orthopedics;  Laterality: Right;     Outpatient Encounter Medications as of 02/12/2024  Medication Sig   acetaminophen (TYLENOL) 500 MG tablet Take 2 tablets (1,000 mg total) by mouth every 6  (six) hours as needed.   ferrous sulfate 325 (65 FE) MG tablet Take 1 tablet (325 mg total) by mouth daily with breakfast.   gabapentin (NEURONTIN) 300 MG capsule Take 1 capsule (300 mg total) by mouth 3 (three) times daily.   IBRANCE 100 MG tablet TAKE 1 TABLET (100 MG TOTAL) DAILY TAKE FOR 21 DAYS ON 7 DAYS OFF REPEAT EVERY 28 DAYS   letrozole (FEMARA) 2.5 MG tablet Take 1 tablet (2.5 mg total) by mouth daily.   lidocaine (LIDODERM) 5 % Place 1 patch onto the skin daily. Remove & Discard patch within 12 hours or as directed by MD   loperamide (IMODIUM A-D) 2 MG tablet Take 2 mg by mouth 4 (four) times daily as needed for diarrhea or loose stools.   Multiple Vitamins-Minerals (MULTIVITAMIN WOMEN PO) Take 1 tablet by mouth daily with breakfast.   Nutritional Supplements (CHLORELLA-SPIRULINA COMPLEX PO) Take 1 capsule by mouth daily as needed (to aid digestive health).   polyethylene glycol (MIRALAX / GLYCOLAX) 17 g packet Take 17 g by mouth 2 (two) times daily.   senna (SENOKOT) 8.6 MG TABS tablet Take 2 tablets (17.2 mg  total) by mouth at bedtime.   No facility-administered encounter medications on file as of 02/12/2024.     There were no vitals filed for this visit. There is no height or weight on file to calculate BMI.   PHYSICAL EXAM GENERAL:alert, no distress and comfortable SKIN: no rash  EYES: sclera clear NECK: without mass LYMPH:  no palpable cervical or supraclavicular lymphadenopathy  LUNGS: clear with normal breathing effort HEART: regular rate & rhythm, no lower extremity edema ABDOMEN: abdomen soft, non-tender and normal bowel sounds NEURO: alert & oriented x 3 with fluent speech, no focal motor/sensory deficits Breast exam:  PAC without erythema    CBC    Component Value Date/Time   WBC 2.5 (L) 01/14/2024 0753   RBC 3.46 (L) 01/14/2024 0753   HGB 11.3 (L) 01/14/2024 0753   HGB 9.4 (L) 10/15/2023 1515   HGB 12.6 11/09/2017 0900   HCT 34.3 (L) 01/14/2024 0753   HCT  38.0 11/09/2017 0900   PLT 228 01/14/2024 0753   PLT 433 (H) 10/15/2023 1515   PLT 203 11/09/2017 0900   MCV 99.1 01/14/2024 0753   MCV 93.6 11/09/2017 0900   MCH 32.7 01/14/2024 0753   MCHC 32.9 01/14/2024 0753   RDW 14.6 01/14/2024 0753   RDW 12.8 11/09/2017 0900   LYMPHSABS 1.0 01/14/2024 0753   LYMPHSABS 1.4 11/09/2017 0900   MONOABS 0.2 01/14/2024 0753   MONOABS 0.4 11/09/2017 0900   EOSABS 0.1 01/14/2024 0753   EOSABS 0.1 11/09/2017 0900   BASOSABS 0.0 01/14/2024 0753   BASOSABS 0.0 11/09/2017 0900     CMP     Component Value Date/Time   NA 141 01/14/2024 0753   NA 141 11/09/2017 0900   K 3.5 01/14/2024 0753   K 4.5 11/09/2017 0900   CL 104 01/14/2024 0753   CO2 30 01/14/2024 0753   CO2 28 11/09/2017 0900   GLUCOSE 140 (H) 01/14/2024 0753   GLUCOSE 98 11/09/2017 0900   BUN 10 01/14/2024 0753   BUN 11.9 11/09/2017 0900   CREATININE 0.80 01/14/2024 0753   CREATININE 0.61 10/15/2023 1515   CREATININE 0.8 11/09/2017 0900   CALCIUM 9.1 01/14/2024 0753   CALCIUM 9.1 11/09/2017 0900   PROT 6.8 01/14/2024 0753   PROT 6.8 11/09/2017 0900   ALBUMIN 4.0 01/14/2024 0753   ALBUMIN 3.8 11/09/2017 0900   AST 11 (L) 01/14/2024 0753   AST 17 10/15/2023 1515   AST 14 11/09/2017 0900   ALT 8 01/14/2024 0753   ALT 17 10/15/2023 1515   ALT 10 11/09/2017 0900   ALKPHOS 49 01/14/2024 0753   ALKPHOS 57 11/09/2017 0900   BILITOT 0.3 01/14/2024 0753   BILITOT 0.3 10/15/2023 1515   BILITOT 0.64 11/09/2017 0900   GFRNONAA >60 01/14/2024 0753   GFRNONAA >60 10/15/2023 1515   GFRAA >60 08/02/2020 0800     ASSESSMENT & PLAN:Kristina Nicholson is a 58 y.o. female with    Malignant neoplasm of upper-inner quadrant of left breast in female, estrogen receptor positive (HCC) [name], stage IIB, ER: Positive, PR: Negative, HER2: Negative, Grade 3 Bone metastasis 06/2023, PIK3CA + -Diagnosed in 04/2017. S/p left breast mastectomy and adjuvant radiation. Mammaprint showed low  risk. -She started antiestrogen therapy with Tamoxifen in 10/2017, tolerated well, goal 10 years. She has not had a menstrual period since 2018.   -Her FSH in March 2021 showed that she is still premenopausal -Due to worsening left shoulder pain, she underwent CT and bone scan in [place]  during her trip. Unfortunately scan showed multiple bone lesion and uptake in sternum, cervical spine, left scapular, left 10th rib, and left hip, concerning for bone mets. She underwent bone biopsy of left scapula and sternum, both confirmed metastatic breast cancer, ER positive (weak to moderate in left scapula biopsy, and 80% strong positive in sternal biopsy), PR weak to moderate positive, HER2 2+ in left scapular, 1+ in sternal bone biopsy -PET scan 06/21/2023 showed multifocal hypermetabolic osseous metastatic disease. No definite pathologic fracture or epidural tumor identified.  No visceral metastasis.  -She understands this is not curable at this stage, treatment goal is palliative -She began ovarian suppression 8/12 and s/p BSO 08/24/23 -Did not tolerate verzenio and developed side effects from [place], recently started Ibrance and continues letrozole     Metastasis to bone -PET scan showed multifocal osseous metastasis  -Started calcium and vitamin D supplement -Zometa q 3 months for 2 years, starting today *** -Palliative radiation has not been pursued yet -Hydrocodone made her feel "drugged;" changed to Tramadol PRN    PLAN:  No orders of the defined types were placed in this encounter.     All questions were answered. The patient knows to call the clinic with any problems, questions or concerns. No barriers to learning were detected. I spent *** counseling the patient face to face. The total time spent in the appointment was *** and more than 50% was on counseling, review of test results, and coordination of care.   Santiago Glad, NP-C @DATE @

## 2024-02-12 ENCOUNTER — Inpatient Hospital Stay: Attending: Hematology

## 2024-02-12 ENCOUNTER — Encounter: Payer: BC Managed Care – PPO | Admitting: Genetic Counselor

## 2024-02-12 ENCOUNTER — Encounter: Payer: Self-pay | Admitting: Nurse Practitioner

## 2024-02-12 ENCOUNTER — Inpatient Hospital Stay (HOSPITAL_BASED_OUTPATIENT_CLINIC_OR_DEPARTMENT_OTHER): Admitting: Nurse Practitioner

## 2024-02-12 ENCOUNTER — Inpatient Hospital Stay

## 2024-02-12 ENCOUNTER — Other Ambulatory Visit: Payer: BC Managed Care – PPO

## 2024-02-12 VITALS — BP 134/78 | HR 68 | Temp 98.0°F | Resp 16 | Ht 63.0 in | Wt 131.6 lb

## 2024-02-12 DIAGNOSIS — C7951 Secondary malignant neoplasm of bone: Secondary | ICD-10-CM

## 2024-02-12 DIAGNOSIS — C50212 Malignant neoplasm of upper-inner quadrant of left female breast: Secondary | ICD-10-CM

## 2024-02-12 DIAGNOSIS — Z515 Encounter for palliative care: Secondary | ICD-10-CM | POA: Diagnosis not present

## 2024-02-12 DIAGNOSIS — Z170 Estrogen receptor positive status [ER+]: Secondary | ICD-10-CM | POA: Insufficient documentation

## 2024-02-12 DIAGNOSIS — G893 Neoplasm related pain (acute) (chronic): Secondary | ICD-10-CM | POA: Diagnosis not present

## 2024-02-12 DIAGNOSIS — Z79899 Other long term (current) drug therapy: Secondary | ICD-10-CM | POA: Insufficient documentation

## 2024-02-12 DIAGNOSIS — Z1732 Human epidermal growth factor receptor 2 negative status: Secondary | ICD-10-CM | POA: Insufficient documentation

## 2024-02-12 DIAGNOSIS — Z1722 Progesterone receptor negative status: Secondary | ICD-10-CM | POA: Diagnosis not present

## 2024-02-12 LAB — CBC WITH DIFFERENTIAL/PLATELET
Abs Immature Granulocytes: 0 10*3/uL (ref 0.00–0.07)
Basophils Absolute: 0 10*3/uL (ref 0.0–0.1)
Basophils Relative: 1 %
Eosinophils Absolute: 0 10*3/uL (ref 0.0–0.5)
Eosinophils Relative: 1 %
HCT: 32.1 % — ABNORMAL LOW (ref 36.0–46.0)
Hemoglobin: 11 g/dL — ABNORMAL LOW (ref 12.0–15.0)
Immature Granulocytes: 0 %
Lymphocytes Relative: 51 %
Lymphs Abs: 0.9 10*3/uL (ref 0.7–4.0)
MCH: 32.9 pg (ref 26.0–34.0)
MCHC: 34.3 g/dL (ref 30.0–36.0)
MCV: 96.1 fL (ref 80.0–100.0)
Monocytes Absolute: 0.1 10*3/uL (ref 0.1–1.0)
Monocytes Relative: 6 %
Neutro Abs: 0.7 10*3/uL — ABNORMAL LOW (ref 1.7–7.7)
Neutrophils Relative %: 41 %
Platelets: 164 10*3/uL (ref 150–400)
RBC: 3.34 MIL/uL — ABNORMAL LOW (ref 3.87–5.11)
RDW: 14.7 % (ref 11.5–15.5)
WBC: 1.8 10*3/uL — ABNORMAL LOW (ref 4.0–10.5)
nRBC: 0 % (ref 0.0–0.2)

## 2024-02-12 LAB — COMPREHENSIVE METABOLIC PANEL WITH GFR
ALT: 10 U/L (ref 0–44)
AST: 13 U/L — ABNORMAL LOW (ref 15–41)
Albumin: 4.2 g/dL (ref 3.5–5.0)
Alkaline Phosphatase: 51 U/L (ref 38–126)
Anion gap: 10 (ref 5–15)
BUN: 10 mg/dL (ref 6–20)
CO2: 26 mmol/L (ref 22–32)
Calcium: 9.1 mg/dL (ref 8.9–10.3)
Chloride: 107 mmol/L (ref 98–111)
Creatinine, Ser: 0.8 mg/dL (ref 0.44–1.00)
GFR, Estimated: >60 mL/min (ref >60)
Glucose, Bld: 155 mg/dL — ABNORMAL HIGH (ref 70–99)
Potassium: 3.5 mmol/L (ref 3.5–5.1)
Sodium: 143 mmol/L (ref 135–145)
Total Bilirubin: 0.4 mg/dL (ref 0.0–1.2)
Total Protein: 6.7 g/dL (ref 6.5–8.1)

## 2024-02-12 MED ORDER — SODIUM CHLORIDE 0.9 % IV SOLN: Freq: Once | INTRAVENOUS | Status: AC

## 2024-02-12 MED ORDER — ZOLEDRONIC ACID 4 MG/100ML IV SOLN
4.0000 mg | Freq: Once | INTRAVENOUS | Status: AC
  Administered 2024-02-12: 4 mg via INTRAVENOUS
  Filled 2024-02-12: qty 100

## 2024-02-12 NOTE — Progress Notes (Signed)
 Pt. denies any recent dental procedures, dental issues, or concerns. Pt. states she is taking oral Vitamin D and Calcium at home.

## 2024-02-12 NOTE — Patient Instructions (Signed)

## 2024-02-12 NOTE — Progress Notes (Signed)
 Per Lacie NP, ok to proceed with Zometa today with WBC/ANC values.  Pt is taking [name]

## 2024-02-12 NOTE — Progress Notes (Signed)
 Palliative Medicine [facility]  [phone] [phone]   Name: Kristina Nicholson Date: 02/12/2024 MRN: [id number]  DOB: [birth year]  Patient Care Team: Patient, No Pcp Per as PCP - General (General Practice) Malachy Mood, MD as Consulting Physician (Hematology) Griselda Miner, MD as Consulting Physician (General Surgery) Lonie Peak, MD as Attending Physician (Radiation Oncology) Pickenpack-Cousar, Arty Baumgartner, NP as Nurse Practitioner (Hospice and Palliative Medicine)    INTERVAL HISTORY: Kristina Nicholson is a 58 y.o. female with  oncologic medical history including estrogen receptor positive breast cancer (04/2017) with metastatic disease to the bone. Palliative ask to see for symptom management and goals of care.   SOCIAL HISTORY:     reports that she has been smoking cigarettes. She started smoking about 38 years ago. She has a 8 pack-year smoking history. She has never used smokeless tobacco. She reports that she does not drink alcohol and does not use drugs.  ADVANCE DIRECTIVES:  None on file  CODE STATUS: Full code  PAST MEDICAL HISTORY: Past Medical History:  Diagnosis Date   Chronic left shoulder pain    Headache    History of external beam radiation therapy    07-31-2017  to  09-10-2017  LEft chest wass 50 gy in 25 fractions, Left Supraclavicular 45 Gy in 25 fractions, Left Chest wall scar boost 10 Gy in 5 fractions.   Horseshoe kidney    Malignant neoplasm of upper-inner quadrant of left breast in female, estrogen receptor positive (HCC) 04/18/2017   oncologist--- dr Mosetta Putt;   dx 06/ 2018 ,  Stage IIB, ER/PR+, G3, IDC/ DCIS;  06-13-2017 s/p left mastectomy w/ node dissection ;  completed radiation 09-10-2017;   recurrence w/ mets to bone 07/ 2024 , started chemotherapy   Metastatic cancer to bone (HCC) 05/2023   Uterine fibroid     ALLERGIES:  has no known allergies.  MEDICATIONS:  Current Outpatient Medications   Medication Sig Dispense Refill   gabapentin (NEURONTIN) 300 MG capsule Take 1 capsule (300 mg total) by mouth 3 (three) times daily. 90 capsule 1   IBRANCE 100 MG tablet TAKE 1 TABLET (100 MG TOTAL) DAILY TAKE FOR 21 DAYS ON 7 DAYS OFF REPEAT EVERY 28 DAYS 21 tablet 0   letrozole (FEMARA) 2.5 MG tablet Take 1 tablet (2.5 mg total) by mouth daily. 30 tablet 5   loperamide (IMODIUM A-D) 2 MG tablet Take 2 mg by mouth 4 (four) times daily as needed for diarrhea or loose stools.     Multiple Vitamins-Minerals (MULTIVITAMIN WOMEN PO) Take 1 tablet by mouth daily with breakfast.     Nutritional Supplements (CHLORELLA-SPIRULINA COMPLEX PO) Take 1 capsule by mouth daily as needed (to aid digestive health).     No current facility-administered medications for this visit.    VITAL SIGNS: LMP 06/22/2015 Comment: 06/08/17 vaginal bleeding for 4 days There were no vitals filed for this visit.  Estimated body mass index is 23.31 kg/m as calculated from the following:   Height as of an earlier encounter on 02/12/24: 5\' 3"  (1.6 m).   Weight as of an earlier encounter on 02/12/24: 131 lb 9.6 oz (59.7 kg).   PERFORMANCE STATUS (ECOG) : 1 - Symptomatic but completely ambulatory  Physical Exam General: NAD Cardiovascular: regular rate and rhythm Pulmonary: normal breathing pattern Extremities: no edema, no joint deformities Skin: no rashes Neurological: AAO x3  IMPRESSION: Discussed the use of AI scribe software for clinical note transcription with the  patient, who gave verbal consent to proceed.  History of Present Illness Kristina Nicholson is a 59 year old female who presents for pain management.  She is accompanied by her husband.  Denies concerns of nausea, vomiting, constipation, or diarrhea.  Reports her appetite is good.  Some days are better than others.  She is experiencing shoulder pain that began after reaching for an item on an upper shelf. The pain worsens when sleeping on her left side.  She is taking Gabapentin, three times a day, provides partial relief, though the pain persists, especially at night. She has not yet tried topical treatments like Voltaren or Icy Hot.  Recommended patient increase gabapentin to 2 capsules at bedtime and 1 capsule twice daily in addition to occasional use of Voltaren cream to the left shoulder for additional support.  She and husband verbalized understanding and appreciation.  We will continue to closely monitor and adjust regimen as needed.  I discussed the importance of continued conversation with family and their medical providers regarding overall plan of care and treatment options, ensuring decisions are within the context of the patients values and GOCs. Assessment & Plan Breast Cancer She is on Ibrance and letrozole for management. No new issues with the current regimen. Minor interactions with supplements discussed, no significant concerns identified. - Continue Ibrance and letrozole regimen as managed by her oncologist.  Shoulder Pain Left shoulder pain exacerbated by activities like reaching and sleeping on the affected side. Gabapentin partially effective without causing drowsiness. Voltaren gel recommended for additional relief and inflammation control. - Continue gabapentin, with the option to increase to two tablets at bedtime if pain worsens at night. - Use Voltaren gel up to three times daily for additional relief. - Contact office if symptoms worsen before follow-up.  I will plan to see patient back in 3-4 weeks.  Sooner if needed.  Patient expressed understanding and was in agreement with this plan. She also understands that She can call the clinic at any time with any questions, concerns, or complaints.   Any controlled substances utilized were prescribed in the context of palliative care. PDMP has been reviewed.   Visit consisted of counseling and education dealing with the complex and emotionally intense issues of symptom  management and palliative care in the setting of serious and potentially life-threatening illness.  Willette Alma, AGPCNP-BC  Palliative Medicine Team/[facility]

## 2024-02-13 LAB — CANCER ANTIGEN 27.29: CA 27.29: 17.5 U/mL (ref 0.0–38.6)

## 2024-02-18 ENCOUNTER — Other Ambulatory Visit: Payer: Self-pay | Admitting: Hematology

## 2024-03-03 ENCOUNTER — Other Ambulatory Visit: Payer: Self-pay | Admitting: Nurse Practitioner

## 2024-03-10 ENCOUNTER — Encounter

## 2024-03-10 ENCOUNTER — Ambulatory Visit: Admitting: Hematology

## 2024-03-10 ENCOUNTER — Ambulatory Visit

## 2024-03-10 ENCOUNTER — Other Ambulatory Visit

## 2024-03-11 ENCOUNTER — Other Ambulatory Visit (HOSPITAL_COMMUNITY): Payer: Self-pay

## 2024-03-11 ENCOUNTER — Ambulatory Visit: Admitting: Hematology

## 2024-03-11 ENCOUNTER — Ambulatory Visit

## 2024-03-11 ENCOUNTER — Encounter

## 2024-03-11 ENCOUNTER — Other Ambulatory Visit

## 2024-03-17 ENCOUNTER — Other Ambulatory Visit: Payer: Self-pay | Admitting: Hematology

## 2024-04-14 ENCOUNTER — Other Ambulatory Visit: Payer: Self-pay | Admitting: Hematology

## 2024-04-16 ENCOUNTER — Other Ambulatory Visit: Payer: Self-pay | Admitting: Nurse Practitioner

## 2024-04-16 ENCOUNTER — Other Ambulatory Visit (HOSPITAL_COMMUNITY): Payer: Self-pay

## 2024-04-16 DIAGNOSIS — Z515 Encounter for palliative care: Secondary | ICD-10-CM

## 2024-04-16 DIAGNOSIS — C7951 Secondary malignant neoplasm of bone: Secondary | ICD-10-CM

## 2024-04-16 DIAGNOSIS — G893 Neoplasm related pain (acute) (chronic): Secondary | ICD-10-CM

## 2024-04-16 MED ORDER — GABAPENTIN 300 MG PO CAPS
300.0000 mg | ORAL_CAPSULE | Freq: Three times a day (TID) | ORAL | 1 refills | Status: DC
Start: 2024-04-16 — End: 2024-06-16
  Filled 2024-04-16: qty 90, 30d supply, fill #0
  Filled 2024-05-16: qty 90, 30d supply, fill #1

## 2024-04-21 ENCOUNTER — Other Ambulatory Visit (HOSPITAL_COMMUNITY): Payer: Self-pay

## 2024-05-04 NOTE — Assessment & Plan Note (Signed)
 [name], stage IIB, ER: Positive, PR: Negative, HER2: Negative, Grade 3, bone metastasis in 05/2023 -She was diagnosed in 04/2017. She is s/p left breast mastectomy and adjuvant radiation. Mammaprint showed low risk. -She started antiestrogen therapy with Tamoxifen  in 10/2017. She is tolerating well, plan for 10 year. She has not had a menstrual period since 2018.  Her FSH in March 2021 showed that she is still premenopausal --Due to worsening left shoulder pain, she underwent CT and bone scan in [place] during her trip. Unfortunately scan showed multiple bone lesion and uptake in sternum, cervical spine, left scapular, left 10th rib, and left hip, concerning for bone mets. She underwent bone biopsy of left scapula and sternum, both confirmed metastatic breast cancer, ER positive (weak to moderate in left scapula biopsy, and 80% strong positive in sternal biopsy), PR weak to moderate positive, HER2 2+ in left scapular, 1+ in sternal bone biopsy -PET scan from June 21, 2023 showed multifocal hypermetabolic osseous metastatic disease. No definite pathologic fracture or epidural tumor identified.  No visceral metastasis.   -she started fulvestrant  injection and Verzenio  on August 13, 2023.  Due to poor tolerance to Verzenio , dose was reduced to 100 mg twice daily -She underwent BSO on August 24, 2023, surgical path was benign. -Due to severe right hip pain from bone metastasis, she underwent right hip replacement on September 30, 2023 -She is currently on letrozole  and Verzenio  100 mg twice daily with moderate diarrhea -Restaging PET scan November 08, 2023 showed excellent response in bone metastasis.  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 -Due to her GI side effect, I changed Verzenio  to Kisqali 600mg  daily 3 weeks on and one week off in mid Feb 2025, and it was changed to Ibrance  in mid March due to poor tolerance

## 2024-05-05 ENCOUNTER — Encounter: Payer: Self-pay | Admitting: Hematology

## 2024-05-05 ENCOUNTER — Inpatient Hospital Stay: Attending: Hematology

## 2024-05-05 ENCOUNTER — Inpatient Hospital Stay (HOSPITAL_BASED_OUTPATIENT_CLINIC_OR_DEPARTMENT_OTHER): Admitting: Nurse Practitioner

## 2024-05-05 ENCOUNTER — Inpatient Hospital Stay (HOSPITAL_BASED_OUTPATIENT_CLINIC_OR_DEPARTMENT_OTHER): Admitting: Hematology

## 2024-05-05 ENCOUNTER — Encounter: Payer: Self-pay | Admitting: Nurse Practitioner

## 2024-05-05 ENCOUNTER — Inpatient Hospital Stay

## 2024-05-05 DIAGNOSIS — Z79811 Long term (current) use of aromatase inhibitors: Secondary | ICD-10-CM | POA: Diagnosis not present

## 2024-05-05 DIAGNOSIS — C50212 Malignant neoplasm of upper-inner quadrant of left female breast: Secondary | ICD-10-CM

## 2024-05-05 DIAGNOSIS — Z515 Encounter for palliative care: Secondary | ICD-10-CM

## 2024-05-05 DIAGNOSIS — Z170 Estrogen receptor positive status [ER+]: Secondary | ICD-10-CM | POA: Diagnosis not present

## 2024-05-05 DIAGNOSIS — Z9012 Acquired absence of left breast and nipple: Secondary | ICD-10-CM | POA: Diagnosis not present

## 2024-05-05 DIAGNOSIS — G893 Neoplasm related pain (acute) (chronic): Secondary | ICD-10-CM

## 2024-05-05 DIAGNOSIS — C7951 Secondary malignant neoplasm of bone: Secondary | ICD-10-CM

## 2024-05-05 DIAGNOSIS — Z79899 Other long term (current) drug therapy: Secondary | ICD-10-CM | POA: Diagnosis not present

## 2024-05-05 DIAGNOSIS — Z9221 Personal history of antineoplastic chemotherapy: Secondary | ICD-10-CM | POA: Insufficient documentation

## 2024-05-05 DIAGNOSIS — Z923 Personal history of irradiation: Secondary | ICD-10-CM | POA: Diagnosis not present

## 2024-05-05 LAB — COMPREHENSIVE METABOLIC PANEL WITH GFR
ALT: 11 U/L (ref 0–44)
AST: 14 U/L — ABNORMAL LOW (ref 15–41)
Albumin: 4.1 g/dL (ref 3.5–5.0)
Alkaline Phosphatase: 46 U/L (ref 38–126)
Anion gap: 6 (ref 5–15)
BUN: 10 mg/dL (ref 6–20)
CO2: 25 mmol/L (ref 22–32)
Calcium: 8.8 mg/dL — ABNORMAL LOW (ref 8.9–10.3)
Chloride: 110 mmol/L (ref 98–111)
Creatinine, Ser: 0.76 mg/dL (ref 0.44–1.00)
GFR, Estimated: >60 mL/min (ref >60)
Glucose, Bld: 105 mg/dL — ABNORMAL HIGH (ref 70–99)
Potassium: 3.7 mmol/L (ref 3.5–5.1)
Sodium: 141 mmol/L (ref 135–145)
Total Bilirubin: 0.4 mg/dL (ref 0.0–1.2)
Total Protein: 6.9 g/dL (ref 6.5–8.1)

## 2024-05-05 LAB — CBC WITH DIFFERENTIAL/PLATELET
Abs Immature Granulocytes: 0 10*3/uL (ref 0.00–0.07)
Basophils Absolute: 0 10*3/uL (ref 0.0–0.1)
Basophils Relative: 1 %
Eosinophils Absolute: 0 10*3/uL (ref 0.0–0.5)
Eosinophils Relative: 1 %
HCT: 32.3 % — ABNORMAL LOW (ref 36.0–46.0)
Hemoglobin: 11.3 g/dL — ABNORMAL LOW (ref 12.0–15.0)
Lymphocytes Relative: 60 %
Lymphs Abs: 1.3 10*3/uL (ref 0.7–4.0)
MCH: 35.1 pg — ABNORMAL HIGH (ref 26.0–34.0)
MCHC: 35 g/dL (ref 30.0–36.0)
MCV: 100.3 fL — ABNORMAL HIGH (ref 80.0–100.0)
Monocytes Absolute: 0.1 10*3/uL (ref 0.1–1.0)
Monocytes Relative: 5 %
Neutro Abs: 0.7 10*3/uL — ABNORMAL LOW (ref 1.7–7.7)
Neutrophils Relative %: 33 %
Platelets: 184 10*3/uL (ref 150–400)
RBC: 3.22 MIL/uL — ABNORMAL LOW (ref 3.87–5.11)
RDW: 14.4 % (ref 11.5–15.5)
Smear Review: NORMAL
WBC: 2.1 10*3/uL — ABNORMAL LOW (ref 4.0–10.5)
nRBC: 0 % (ref 0.0–0.2)

## 2024-05-05 MED ORDER — PALBOCICLIB 125 MG PO TABS: 125.0000 mg | ORAL_TABLET | Freq: Every day | ORAL | 0 refills | Status: DC

## 2024-05-05 MED ORDER — SODIUM CHLORIDE 0.9 % IV SOLN: Freq: Once | INTRAVENOUS | Status: DC

## 2024-05-05 MED ORDER — ZOLEDRONIC ACID 4 MG/100ML IV SOLN
4.0000 mg | Freq: Once | INTRAVENOUS | Status: AC
  Administered 2024-05-05: 4 mg via INTRAVENOUS
  Filled 2024-05-05: qty 100

## 2024-05-05 NOTE — Progress Notes (Signed)
 [facility]   Telephone:(336) [phone] Fax:(336) [phone]   Clinic Follow up Note   Patient Care Team: Patient, [name], MD as Consulting Physician (Hematology) [name], MD as Consulting Physician (General Surgery) [name], MD as Attending Physician (Radiation Oncology) Pickenpack-Cousar, [name], NP as Nurse Practitioner (Hospice and Palliative Medicine)  Date of Service:  05/05/2024  CHIEF COMPLAINT: f/u of sided breast cancer  CURRENT THERAPY:  Letrozole  and Ibrance   Oncology History   Malignant neoplasm of upper-inner quadrant of left breast in female, estrogen receptor positive (HCC) [name], stage IIB, ER: Positive, PR: Negative, HER2: Negative, Grade 3, bone metastasis in 05/2023 -She was diagnosed in 04/2017. She is s/p left breast mastectomy and adjuvant radiation. Mammaprint showed low risk. -She started antiestrogen therapy with Tamoxifen  in 10/2017. She is tolerating well, plan for 10 year. She has not had a menstrual period since 2018.  Her FSH in March 2021 showed that she is still premenopausal --Due to worsening left shoulder pain, she underwent CT and bone scan in [place] during her trip. Unfortunately scan showed multiple bone lesion and uptake in sternum, cervical spine, left scapular, left 10th rib, and left hip, concerning for bone mets. She underwent bone biopsy of left scapula and sternum, both confirmed metastatic breast cancer, ER positive (weak to moderate in left scapula biopsy, and 80% strong positive in sternal biopsy), PR weak to moderate positive, HER2 2+ in left scapular, 1+ in sternal bone biopsy -PET scan from June 21, 2023 showed multifocal hypermetabolic osseous metastatic disease. No definite pathologic fracture or epidural tumor identified.  No visceral metastasis.   -she started fulvestrant  injection and Verzenio  on August 13, 2023.  Due to poor tolerance to Verzenio , dose  was reduced to 100 mg twice daily -She underwent BSO on August 24, 2023, surgical path was benign. -Due to severe right hip pain from bone metastasis, she underwent right hip replacement on September 30, 2023 -She is currently on letrozole  and Verzenio  100 mg twice daily with moderate diarrhea -Restaging PET scan November 08, 2023 showed excellent response in bone metastasis.  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 -Due to her GI side effect, I changed Verzenio  to Kisqali 600mg  daily 3 weeks on and one week off in mid Feb 2025, and it was changed to Ibrance  in mid March due to poor tolerance    Metastasis to bone [facility]) -She unfortunately developed metastatic breast cancer to multiple sites of her bones in June 2024 -I recommend her to start a calcium and vitamin D supplement -I discussed the benefit and the potential side effect of biphosphonate Zometa , especially risk of jaw necrosis and infusion reactions.  She voiced good understanding and agrees to proceed.  She does not have a dentist and we will watch closely. plan to give her every 3 months for 2 years. She started in 07/2023 -Due to her significant left shoulder pain, we discussed the role of palliative radiation, she is interested.  However she has high co-pay, so she wants to hold it for now.   Assessment & Plan Metastatic breast cancer Metastatic breast cancer managed with Ibrance  (palbociclib ) 100 mg daily. She reports better tolerance compared to previous medications (Kisqali and Verzenio ). Tumor marker decreased from 45 to 17, indicating a positive response. No significant side effects except alopecia. Discussed increasing Ibrance  dose to 125 mg to enhance efficacy. She agrees to  try the increased dose with the option to revert to 100 mg if adverse effects occur. PET scan planned to assess treatment efficacy. Financial assistance for Ibrance  is in place, and she has met her  insurance deductible. - Increase Ibrance  dose to 125 mg for next cycle. - Order PET scan in 3-4 weeks to assess treatment efficacy. - Ensure financial assistance for Ibrance  is maintained.  Hair loss due to letrozole   Alopecia as a side effect of letrozole  and Ibrance . She reports hair loss but is reassured. Discussed biotin as a supportive measure for hair and nail health. - Recommend biotin supplementation for hair and nail health.  Diarrhea Intermittent diarrhea, most recently on Friday and Saturday. Not attributed to Ibrance  as it is uncommon after three months of treatment.  Low vitamin D and B12 levels Low vitamin D and B12 levels noted in February 2025. She has been taking vitamin D daily but may need an increased dose. B12 supplementation not initiated yet. - Increase vitamin D supplementation to 2000 IU daily. - Initiate B12 supplementation at 1000 mcg daily.  Plan - She is tolerating Ibrance  well, will continue.  Plan to increase to 125 mg daily for 3 weeks on and 1 week off from next cycle in July, prescription called in today - Follow-up in 4 weeks with restaging PET scan 1 week prior - CMP still pending, if adequate, will proceed with Zometa  infusion today and continue every 3 months - I recommend to increase vitamin D intake and start over-the-counter vitamin D 1000 mcg daily   SUMMARY OF ONCOLOGIC HISTORY: Oncology History Overview Note  Cancer Staging Malignant neoplasm of upper-inner quadrant of left breast in female, estrogen receptor positive (HCC) Staging form: Breast, AJCC 8th Edition - Clinical stage from 04/13/2017: Stage IIA (cT2, cN0, cM0, G2, ER: Positive, PR: Negative, HER2: Negative) - Signed by [name], MD on 04/25/2017 - Pathologic stage from 06/13/2017: Stage IIB (pT2, pN1a, cM0, G1, ER: Positive, PR: Negative, HER2: Negative) - Signed by [name], MD on 09/04/2017     Malignant neoplasm of upper-inner quadrant of left breast in female, estrogen receptor  positive (HCC)  04/10/2017 Mammogram   Left breast mammo and US  showed an irregular hypoechoic shadowing mass at 9 o'clock 1 cm from the nipple measuring 2.3 x 1 x 2.4 cm. This corresponds well with the mass seen at mammography. No lymphadenopathy seen in the left axilla.   04/18/2017 Initial Diagnosis   Malignant neoplasm of upper-inner quadrant of left breast in female, estrogen receptor positive (HCC)   04/22/2017 Receptors her2   Estrogen Receptor: 95%, POSITIVE, STRONG STAINING INTENSITY Progesterone Receptor: 0%, NEGATIVE Proliferation Marker Ki67: 10% HER2 -   04/22/2017 Initial Biopsy   Breast, left, needle core biopsy, 9:30 o'clock - INVASIVE DUCTAL CARCINOMA, G2 - DUCTAL CARCINOMA IN SITU.   06/13/2017 Surgery   Left breast mastectomy with sentinel lymph node biopsy performed by Dr Curvin.    06/13/2017 Pathology Results   Breast, simple mastectomy, Left - INVASIVE DUCTAL CARCINOMA, GRADE I/III, SPANNING 2.5 CM. - DUCTAL CARCINOMA IN SITU, INTERMEDIATE GRADE. - LOBULAR NEOPLASIA (ATYPICAL LOBULAR HYPERPLASIA). - THE SURGICAL RESECTION MARGINS ARE NEGATIVE FOR CARCINOMA.  Lymph node, sentinel, biopsy, Left axillary #1 - METASTATIC CARCINOMA IN 1 OF 3 LYMPH NODE (1/3), WITH EXTRACAPSULAR EXTENSION.   06/13/2017 Pathology Results   Mammaprint with low risk disease.    07/31/2017 - 09/10/2017 Radiation Therapy   The patient began a course of radiotherapy over the left chest wall and  axillary area, supervised by Dr Lauraine Golden, MD.     Radiation treatment dates:   07/31/2017 - 09/10/2017   Site/dose:    1) Left Chest Wall / 50 Gy in 25 fractions 2) Left Supraclavicular / 45 Gy in 25 fractions 3) Left Chest Wall Scar Boost / 10 Gy in 5 fractions   Beams/energy:    1) Opposed tangents 3D/ 10 and 6 MV photons 2) Additional fields 3D / 10 MV and 6 MV photons 3) En face electrons / 6 MeV electrons   Narrative: The patient tolerated radiation treatment relatively well. She  experienced mild fatigue. She reported pain, burning, and itching to the left breast and swelling under the left arm. On physical exam, she had dry peeling in the left UIQ of the breast and left axilla, as well as diffuse hyperpigmentation and erythema of the left chest wall. She is using the radiaplex cream to the treatment area and neosporin to the peeling areas.   10/13/2017 -  Anti-estrogen oral therapy   Tamoxifen  10mg  daily   04/11/2018 Mammogram   04/11/2018 Mammogram IMPRESSION: No mammographic evidence of malignancy. A result letter of this screening mammogram will be mailed directly to the patient.   04/11/2018 Mammogram   Screening Unilateral Right: No evidence of mammographic malignancy.       Discussed the use of AI scribe software for clinical note transcription with the patient, who gave verbal consent to proceed.  History of Present Illness Kristina Nicholson is a 58 year old female with metastatic breast cancer who presents for follow-up. She is accompanied by her caregiver, who is also her husband.  She is currently on Ibrance  100 mg and tolerates it well, with alopecia as the only significant side effect. She experienced diarrhea over the weekend, with eight loose bowel movements on Saturday, but it has resolved. She is on a break from Ibrance  this week and has received a refill.  She takes Letrozole  and Gabapentin , with Gabapentin  effectively managing her pain. She experiences soreness after walking and occasional left-sided back pain but does not use Tylenol  or ibuprofen .  She takes calcium, vitamin D, and B12 supplements due to previously low B12 levels.     All other systems were reviewed with the patient and are negative.  MEDICAL HISTORY:  Past Medical History:  Diagnosis Date   Chronic left shoulder pain    Headache    History of external beam radiation therapy    07-31-2017  to  09-10-2017  LEft chest wass 50 gy in 25 fractions, Left Supraclavicular 45  Gy in 25 fractions, Left Chest wall scar boost 10 Gy in 5 fractions.   Horseshoe kidney    Malignant neoplasm of upper-inner quadrant of left breast in female, estrogen receptor positive (HCC) 04/18/2017   oncologist--- dr lanny;   dx 06/ 2018 ,  Stage IIB, ER/PR+, G3, IDC/ DCIS;  06-13-2017 s/p left mastectomy w/ node dissection ;  completed radiation 09-10-2017;   recurrence w/ mets to bone 07/ 2024 , started chemotherapy   Metastatic cancer to bone (HCC) 05/2023   Uterine fibroid     SURGICAL HISTORY: Past Surgical History:  Procedure Laterality Date   APPENDECTOMY  2005   COLONOSCOPY  2016   GANGLION CYST EXCISION Right 09/24/2017   Procedure: EXCISION OF RIGHT DORSAL WRIST GANGLION;  Surgeon: Sebastian Lenis, MD;  Location: [facility];  Service: Orthopedics;  Laterality: Right;   LAPAROSCOPIC SALPINGO OOPHERECTOMY Bilateral 08/24/2023   Procedure: LAPAROSCOPIC SALPINGO  OOPHORECTOMY;  Surgeon: Dannielle Bouchard, DO;  Location: [facility];  Service: Gynecology;  Laterality: Bilateral;   LIPOMA EXCISION Left 06/13/2017   Procedure: EXCISION 3CM LIPOMA ON LEFT ARM;  Surgeon: [name], MD;  Location: [facility];  Service: General;  Laterality: Left;   MASTECTOMY W/ SENTINEL NODE BIOPSY Left 06/13/2017   Procedure: MASTECTOMY WITH SENTINEL LYMPH NODE BIOPSY;  Surgeon: [name], MD;  Location: [facility];  Service: General;  Laterality: Left;   TOTAL HIP ARTHROPLASTY Right 09/30/2023   Procedure: TOTAL HIP ARTHROPLASTY ANTERIOR APPROACH;  Surgeon: Ernie Cough, MD;  Location: [facility];  Service: Orthopedics;  Laterality: Right;    I have reviewed the social history and family history with the patient and they are unchanged from previous note.  ALLERGIES:  has no known allergies.  MEDICATIONS:  Current Outpatient Medications  Medication Sig Dispense Refill   palbociclib  (IBRANCE ) 125 MG tablet Take 1 tablet (125 mg total) by mouth daily. Take for 21 days on, 7  days off, repeat every 28 days. 21 tablet 0   gabapentin  (NEURONTIN ) 300 MG capsule Take 1 capsule (300 mg total) by mouth 3 (three) times daily. 90 capsule 1   letrozole  (FEMARA ) 2.5 MG tablet Take 1 tablet (2.5 mg total) by mouth daily. 30 tablet 5   loperamide (IMODIUM A-D) 2 MG tablet Take 2 mg by mouth 4 (four) times daily as needed for diarrhea or loose stools.     Multiple Vitamins-Minerals (MULTIVITAMIN WOMEN PO) Take 1 tablet by mouth daily with breakfast.     Nutritional Supplements (CHLORELLA-SPIRULINA COMPLEX PO) Take 1 capsule by mouth daily as needed (to aid digestive health).     No current facility-administered medications for this visit.    PHYSICAL EXAMINATION: ECOG PERFORMANCE STATUS: 1 - Symptomatic but completely ambulatory  Vitals:   05/05/24 0928  BP: 136/70  Pulse: 81  Resp: 17  Temp: 97.7 F (36.5 C)  SpO2: 96%   Wt Readings from Last 3 Encounters:  05/05/24 131 lb 8 oz (59.6 kg)  02/12/24 131 lb 9.6 oz (59.7 kg)  01/14/24 130 lb 3.2 oz (59.1 kg)     GENERAL:alert, no distress and comfortable SKIN: skin color, texture, turgor are normal, no rashes or significant lesions EYES: normal, Conjunctiva are pink and non-injected, sclera clear NECK: supple, thyroid  normal size, non-tender, without nodularity LYMPH:  no palpable lymphadenopathy in the cervical, axillary  LUNGS: clear to auscultation and percussion with normal breathing effort HEART: regular rate & rhythm and no murmurs and no lower extremity edema ABDOMEN:abdomen soft, non-tender and normal bowel sounds Musculoskeletal:no cyanosis of digits and no clubbing  NEURO: alert & oriented x 3 with fluent speech, no focal motor/sensory deficits  Physical Exam    LABORATORY DATA:  I have reviewed the data as listed    Latest Ref Rng & Units 05/05/2024    9:11 AM 02/12/2024    9:01 AM 01/14/2024    7:53 AM  CBC  WBC 4.0 - 10.5 K/uL 2.1  1.8  2.5   Hemoglobin 12.0 - 15.0 g/dL 88.6  88.9  88.6    Hematocrit 36.0 - 46.0 % 32.3  32.1  34.3   Platelets 150 - 400 K/uL 184  164  228         Latest Ref Rng & Units 02/12/2024    9:01 AM 01/14/2024    7:53 AM 10/15/2023    3:15 PM  CMP  Glucose 70 - 99 mg/dL 844  140  82   BUN 6 - 20 mg/dL 10  10  12    Creatinine 0.44 - 1.00 mg/dL 9.19  9.19  9.38   Sodium 135 - 145 mmol/L 143  141  140   Potassium 3.5 - 5.1 mmol/L 3.5  3.5  3.9   Chloride 98 - 111 mmol/L 107  104  105   CO2 22 - 32 mmol/L 26  30  29    Calcium 8.9 - 10.3 mg/dL 9.1  9.1  9.2   Total Protein 6.5 - 8.1 g/dL 6.7  6.8  7.0   Total Bilirubin 0.0 - 1.2 mg/dL 0.4  0.3  0.3   Alkaline Phos 38 - 126 U/L 51  49  69   AST 15 - 41 U/L 13  11  17    ALT 0 - 44 U/L 10  8  17        RADIOGRAPHIC STUDIES: I have personally reviewed the radiological images as listed and agreed with the findings in the report. No results found.    Orders Placed This Encounter  Procedures   NM PET Image Restag (PS) Skull Base To Thigh    Standing Status:   Future    Expected Date:   05/26/2024    Expiration Date:   05/05/2025    If indicated for the ordered procedure, I authorize the administration of a radiopharmaceutical per Radiology protocol:   Yes    Is the patient pregnant?:   No    Preferred imaging location?:   [facility]   All questions were answered. The patient knows to call the clinic with any problems, questions or concerns. No barriers to learning was detected. The total time spent in the appointment was 25 minutes, including review of chart and various tests results, discussions about plan of care and coordination of care plan     Onita Mattock, MD 05/05/2024

## 2024-05-05 NOTE — Progress Notes (Signed)
 Palliative Medicine [facility]  Telephone:(336) [phone] Fax:(336) [phone]   Name: Kristina Nicholson Date: 05/05/2024 MRN: [id number]  DOB: [birth year]  Patient Care Team: Patient, [name], MD as Consulting Physician (Hematology) [name], MD as Consulting Physician (General Surgery) [name], MD as Attending Physician (Radiation Oncology) Pickenpack-Cousar, [name], NP as Nurse Practitioner (Hospice and Palliative Medicine)    INTERVAL HISTORY: Kristina Nicholson is a 58 y.o. female with  oncologic medical history including estrogen receptor positive breast cancer (04/2017) with metastatic disease to the bone. Palliative ask to see for symptom management and goals of care.   SOCIAL HISTORY:     reports that she has been smoking cigarettes. She started smoking about 38 years ago. She has a 8 pack-year smoking history. She has never used smokeless tobacco. She reports that she does not drink alcohol and does not use drugs.  ADVANCE DIRECTIVES:  None on file  CODE STATUS: Full code  PAST MEDICAL HISTORY: Past Medical History:  Diagnosis Date   Chronic left shoulder pain    Headache    History of external beam radiation therapy    07-31-2017  to  09-10-2017  LEft chest wass 50 gy in 25 fractions, Left Supraclavicular 45 Gy in 25 fractions, Left Chest wall scar boost 10 Gy in 5 fractions.   Horseshoe kidney    Malignant neoplasm of upper-inner quadrant of left breast in female, estrogen receptor positive (HCC) 04/18/2017   oncologist--- dr lanny;   dx 06/ 2018 ,  Stage IIB, ER/PR+, G3, IDC/ DCIS;  06-13-2017 s/p left mastectomy w/ node dissection ;  completed radiation 09-10-2017;   recurrence w/ mets to bone 07/ 2024 , started chemotherapy   Metastatic cancer to bone (HCC) 05/2023   Uterine fibroid     ALLERGIES:  has no known allergies.  MEDICATIONS:  Current Outpatient Medications   Medication Sig Dispense Refill   gabapentin  (NEURONTIN ) 300 MG capsule Take 1 capsule (300 mg total) by mouth 3 (three) times daily. 90 capsule 1   letrozole  (FEMARA ) 2.5 MG tablet Take 1 tablet (2.5 mg total) by mouth daily. 30 tablet 5   loperamide (IMODIUM A-D) 2 MG tablet Take 2 mg by mouth 4 (four) times daily as needed for diarrhea or loose stools.     Multiple Vitamins-Minerals (MULTIVITAMIN WOMEN PO) Take 1 tablet by mouth daily with breakfast.     Nutritional Supplements (CHLORELLA-SPIRULINA COMPLEX PO) Take 1 capsule by mouth daily as needed (to aid digestive health).     palbociclib  (IBRANCE ) 125 MG tablet Take 1 tablet (125 mg total) by mouth daily. Take for 21 days on, 7 days off, repeat every 28 days. 21 tablet 0   No current facility-administered medications for this visit.   Facility-Administered Medications Ordered in Other Visits  Medication Dose Route Frequency Provider Last Rate Last Admin   0.9 %  sodium chloride  infusion   Intravenous Once Burton, Lacie K, NP        VITAL SIGNS: LMP 06/22/2015 Comment: 06/08/17 vaginal bleeding for 4 days There were no vitals filed for this visit.  Estimated body mass index is 23.29 kg/m as calculated from the following:   Height as of an earlier encounter on 05/05/24: 5' 3 (1.6 m).   Weight as of an earlier encounter on 05/05/24: 131 lb 8 oz (59.6 kg).   PERFORMANCE STATUS (ECOG) : 1 - Symptomatic but completely ambulatory  Physical Exam General: NAD Cardiovascular: regular rate and rhythm Pulmonary: normal breathing pattern Extremities: no edema, no joint deformities Skin: no rashes Neurological: AAO x3  IMPRESSION: Discussed the use of AI scribe software for clinical note transcription with the patient, who gave verbal consent to proceed.  History of Present Illness Kristina Nicholson is a 58 year old female who presents for pain management.  She is accompanied by her husband.  Denies concerns of nausea, vomiting,  constipation, or diarrhea.  Reports her appetite is good.  Some days are better than others. Appetite is good. Weight stable at 131lbs.   She has returned to work three days a week, finding it physically demanding. She experiences some leg soreness, often requiring a knee brace or compression socks. Patient reports persistent shoulder pain however much improved from previous visit.   Mrs. Loomer  initially found gabapentin  ineffective, but now reports improved nights, describing them as 'more better' than before. She takes gabapentin  300 mg, one in the morning and two at night.  I discussed the importance of continued conversation with family and their medical providers regarding overall plan of care and treatment options, ensuring decisions are within the context of the patients values and GOCs. Assessment & Plan Shoulder Pain Left shoulder pain improved with use of Tylenol  and gabapentin . Gabapentin  300 mg regimen effective for pain management. - Continue gabapentin  300 mg, one tablet in the morning and two at night. - Ensure gabapentin  refill availability. - Use Voltaren gel up to three times daily for additional relief. - Contact office if symptoms worsen before follow-up.  No new symptoms. I will plan to see patient back in 3-4 weeks.  Sooner if needed.  Patient expressed understanding and was in agreement with this plan. She also understands that She can call the clinic at any time with any questions, concerns, or complaints.   Any controlled substances utilized were prescribed in the context of palliative care. PDMP has been reviewed.   Visit consisted of counseling and education dealing with the complex and emotionally intense issues of symptom management and palliative care in the setting of serious and potentially life-threatening illness.  Levon Borer, AGPCNP-BC  Palliative Medicine Team/[facility]

## 2024-05-05 NOTE — Assessment & Plan Note (Signed)
-  She unfortunately developed metastatic breast cancer to multiple sites of her bones in June 2024 -I recommend her to start a calcium and vitamin D supplement -I discussed the benefit and the potential side effect of biphosphonate Zometa , especially risk of jaw necrosis and infusion reactions.  She voiced good understanding and agrees to proceed.  She does not have a dentist and we will watch closely. plan to give her every 3 months for 2 years. She started in 07/2023 -Due to her significant left shoulder pain, we discussed the role of palliative radiation, she is interested.  However she has high co-pay, so she wants to hold it for now.

## 2024-05-05 NOTE — Patient Instructions (Signed)

## 2024-05-06 ENCOUNTER — Telehealth: Payer: Self-pay | Admitting: Hematology

## 2024-05-06 ENCOUNTER — Other Ambulatory Visit: Payer: Self-pay

## 2024-05-06 LAB — CANCER ANTIGEN 27.29: CA 27.29: 29.7 U/mL (ref 0.0–38.6)

## 2024-05-06 NOTE — Telephone Encounter (Signed)
 Scheduled appointments per 6/23 los. Talked with the patients spouse and he is aware of the made appointments for the patient.

## 2024-05-07 ENCOUNTER — Telehealth: Payer: Self-pay

## 2024-05-07 NOTE — Telephone Encounter (Signed)
 Patients husband called in stating that his wife had several episodes of loose stools (not diarrhea) yesterday with abd pain. He gave her Pepto and it cleared up. She had the same thing happen again this monring at 5am 5 back to back per the husband with abd pain 8 out of 10. He gave her Pepto again and wanted to make sure this was nothing more than a virus. I spoke with nurse Anders Scurry RN she stated it sounds more like a virus and not issues with treatment. She advised that they use imodium instead of Pepto due to it turning the stools black and would appear like she has a bleed. I informed the spouse of what the nurse said he had no further questions at this time. Also told him if she didn't get any better to make sure he contacted us  back before the weekend.

## 2024-05-13 ENCOUNTER — Other Ambulatory Visit: Payer: Self-pay

## 2024-05-26 ENCOUNTER — Encounter (HOSPITAL_COMMUNITY)
Admission: RE | Admit: 2024-05-26 | Discharge: 2024-05-26 | Disposition: A | Discharge status: Home / Self Care | Source: Ambulatory Visit | Attending: Hematology | Admitting: Hematology

## 2024-05-26 DIAGNOSIS — Z170 Estrogen receptor positive status [ER+]: Secondary | ICD-10-CM | POA: Insufficient documentation

## 2024-05-26 DIAGNOSIS — C50919 Malignant neoplasm of unspecified site of unspecified female breast: Secondary | ICD-10-CM | POA: Diagnosis not present

## 2024-05-26 DIAGNOSIS — C50212 Malignant neoplasm of upper-inner quadrant of left female breast: Secondary | ICD-10-CM | POA: Insufficient documentation

## 2024-05-26 LAB — GLUCOSE, CAPILLARY: Glucose-Capillary: 96 mg/dL (ref 70–99)

## 2024-05-26 MED ORDER — FLUDEOXYGLUCOSE F - 18 (FDG) INJECTION
6.5000 | Freq: Once | INTRAVENOUS | Status: AC
  Administered 2024-05-26: 6.5 via INTRAVENOUS

## 2024-06-01 NOTE — Assessment & Plan Note (Addendum)
 [name], stage IIB, ER: Positive, PR: Negative, HER2: Negative, Grade 3, bone metastasis in 05/2023 -She was diagnosed in 04/2017. She is s/p left breast mastectomy and adjuvant radiation. Mammaprint showed low risk. -She started antiestrogen therapy with Tamoxifen  in 10/2017. She is tolerating well, plan for 10 year. She has not had a menstrual period since 2018.  Her FSH in March 2021 showed that she is still premenopausal --Due to worsening left shoulder pain, she underwent CT and bone scan in [place] during her trip. Unfortunately scan showed multiple bone lesion and uptake in sternum, cervical spine, left scapular, left 10th rib, and left hip, concerning for bone mets. She underwent bone biopsy of left scapula and sternum, both confirmed metastatic breast cancer, ER positive (weak to moderate in left scapula biopsy, and 80% strong positive in sternal biopsy), PR weak to moderate positive, HER2 2+ in left scapular, 1+ in sternal bone biopsy -PET scan from June 21, 2023 showed multifocal hypermetabolic osseous metastatic disease. No definite pathologic fracture or epidural tumor identified.  No visceral metastasis.   -she started fulvestrant  injection and Verzenio  on August 13, 2023.  Due to poor tolerance to Verzenio , dose was reduced to 100 mg twice daily -She underwent BSO on August 24, 2023, surgical path was benign. -Due to severe right hip pain from bone metastasis, she underwent right hip replacement on September 30, 2023 -Restaging PET scan November 08, 2023 showed excellent response in bone metastasis.  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 -Due to her GI side effect, I changed Verzenio  to Kisqali 600mg  daily 3 weeks on and one week off in mid Feb 2025, and it was changed to Ibrance  in mid March due to poor tolerance

## 2024-06-02 ENCOUNTER — Inpatient Hospital Stay: Admitting: Nurse Practitioner

## 2024-06-02 ENCOUNTER — Inpatient Hospital Stay: Attending: Hematology

## 2024-06-02 ENCOUNTER — Other Ambulatory Visit (HOSPITAL_COMMUNITY): Payer: Self-pay

## 2024-06-02 ENCOUNTER — Other Ambulatory Visit: Payer: Self-pay

## 2024-06-02 ENCOUNTER — Inpatient Hospital Stay (HOSPITAL_BASED_OUTPATIENT_CLINIC_OR_DEPARTMENT_OTHER): Admitting: Hematology

## 2024-06-02 VITALS — BP 108/56 | HR 72 | Temp 97.6°F | Resp 15 | Ht 63.0 in | Wt 130.4 lb

## 2024-06-02 DIAGNOSIS — Z9221 Personal history of antineoplastic chemotherapy: Secondary | ICD-10-CM | POA: Diagnosis not present

## 2024-06-02 DIAGNOSIS — Z923 Personal history of irradiation: Secondary | ICD-10-CM | POA: Insufficient documentation

## 2024-06-02 DIAGNOSIS — C50212 Malignant neoplasm of upper-inner quadrant of left female breast: Secondary | ICD-10-CM | POA: Diagnosis not present

## 2024-06-02 DIAGNOSIS — C7951 Secondary malignant neoplasm of bone: Secondary | ICD-10-CM | POA: Insufficient documentation

## 2024-06-02 DIAGNOSIS — Z9012 Acquired absence of left breast and nipple: Secondary | ICD-10-CM | POA: Diagnosis not present

## 2024-06-02 DIAGNOSIS — Z170 Estrogen receptor positive status [ER+]: Secondary | ICD-10-CM | POA: Diagnosis not present

## 2024-06-02 LAB — COMPREHENSIVE METABOLIC PANEL WITH GFR
ALT: 9 U/L (ref 0–44)
AST: 14 U/L — ABNORMAL LOW (ref 15–41)
Albumin: 4 g/dL (ref 3.5–5.0)
Alkaline Phosphatase: 46 U/L (ref 38–126)
Anion gap: 4 — ABNORMAL LOW (ref 5–15)
BUN: 12 mg/dL (ref 6–20)
CO2: 29 mmol/L (ref 22–32)
Calcium: 8.7 mg/dL — ABNORMAL LOW (ref 8.9–10.3)
Chloride: 109 mmol/L (ref 98–111)
Creatinine, Ser: 0.81 mg/dL (ref 0.44–1.00)
GFR, Estimated: >60 mL/min (ref >60)
Glucose, Bld: 128 mg/dL — ABNORMAL HIGH (ref 70–99)
Potassium: 3.7 mmol/L (ref 3.5–5.1)
Sodium: 142 mmol/L (ref 135–145)
Total Bilirubin: 0.4 mg/dL (ref 0.0–1.2)
Total Protein: 6.6 g/dL (ref 6.5–8.1)

## 2024-06-02 LAB — CBC WITH DIFFERENTIAL/PLATELET
Abs Immature Granulocytes: 0 K/uL (ref 0.00–0.07)
Basophils Absolute: 0 K/uL (ref 0.0–0.1)
Basophils Relative: 1 %
Eosinophils Absolute: 0 K/uL (ref 0.0–0.5)
Eosinophils Relative: 2 %
HCT: 32.6 % — ABNORMAL LOW (ref 36.0–46.0)
Hemoglobin: 11.3 g/dL — ABNORMAL LOW (ref 12.0–15.0)
Immature Granulocytes: 0 %
Lymphocytes Relative: 41 %
Lymphs Abs: 0.8 K/uL (ref 0.7–4.0)
MCH: 35.4 pg — ABNORMAL HIGH (ref 26.0–34.0)
MCHC: 34.7 g/dL (ref 30.0–36.0)
MCV: 102.2 fL — ABNORMAL HIGH (ref 80.0–100.0)
Monocytes Absolute: 0.2 K/uL (ref 0.1–1.0)
Monocytes Relative: 8 %
Neutro Abs: 1 K/uL — ABNORMAL LOW (ref 1.7–7.7)
Neutrophils Relative %: 48 %
Platelets: 174 K/uL (ref 150–400)
RBC: 3.19 MIL/uL — ABNORMAL LOW (ref 3.87–5.11)
RDW: 13.8 % (ref 11.5–15.5)
Smear Review: NORMAL
WBC: 2.1 K/uL — ABNORMAL LOW (ref 4.0–10.5)
nRBC: 0 % (ref 0.0–0.2)

## 2024-06-02 MED ORDER — EXEMESTANE 25 MG PO TABS
25.0000 mg | ORAL_TABLET | Freq: Every day | ORAL | 2 refills | Status: DC
  Filled 2024-06-02: qty 30, 30d supply, fill #0

## 2024-06-02 NOTE — Progress Notes (Signed)
 [facility]   [phone] [phone]   Clinic Follow up Note   Patient Care Team: Pcp, No as PCP - General [name], MD as Consulting Physician (Hematology) [name], MD as Consulting Physician (General Surgery) [name], MD as Attending Physician (Radiation Oncology) [name], NP as Nurse Practitioner (Hospice and Palliative Medicine)  Date of Service:  06/02/2024  CHIEF COMPLAINT: f/u of metastatic breast cancer  CURRENT THERAPY:  Letrozole  and Ibrance   Oncology History   Malignant neoplasm of upper-inner quadrant of left breast in female, estrogen receptor positive (HCC) [name], stage IIB, ER: Positive, PR: Negative, HER2: Negative, Grade 3, bone metastasis in 05/2023 -She was diagnosed in 04/2017. She is s/p left breast mastectomy and adjuvant radiation. Mammaprint showed low risk. -She started antiestrogen therapy with Tamoxifen  in 10/2017. She is tolerating well, plan for 10 year. She has not had a menstrual period since 2018.  Her FSH in March 2021 showed that she is still premenopausal --Due to worsening left shoulder pain, she underwent CT and bone scan in [place] during her trip. Unfortunately scan showed multiple bone lesion and uptake in sternum, cervical spine, left scapular, left 10th rib, and left hip, concerning for bone mets. She underwent bone biopsy of left scapula and sternum, both confirmed metastatic breast cancer, ER positive (weak to moderate in left scapula biopsy, and 80% strong positive in sternal biopsy), PR weak to moderate positive, HER2 2+ in left scapular, 1+ in sternal bone biopsy -PET scan from June 21, 2023 showed multifocal hypermetabolic osseous metastatic disease. No definite pathologic fracture or epidural tumor identified.  No visceral metastasis.   -she started fulvestrant  injection and Verzenio  on August 13, 2023.  Due to poor tolerance to Verzenio , dose was reduced to 100 mg twice  daily -She underwent BSO on August 24, 2023, surgical path was benign. -Due to severe right hip pain from bone metastasis, she underwent right hip replacement on September 30, 2023 -Restaging PET scan November 08, 2023 showed excellent response in bone metastasis.  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 -Due to her GI side effect, I changed Verzenio  to [facility] 600mg  daily 3 weeks on and one week off in mid Feb 2025, and it was changed to Ibrance  in mid March due to poor tolerance   Assessment & Plan Metastatic breast cancer Metastatic breast cancer with bone metastasis in the left shoulder and scapula. PET scan shows improvement except for a persistent bright spot in the left rib area, likely causing shoulder discomfort. Current treatment with Ibrance  and Letrozole  is effective except in the left rib area. - Continue Ibrance . - Switch from Letrozole  to Exemestane  if insurance copay is reasonable due to joint pain side effects. - Order PET scan in three months to monitor disease progression, ensuring insurance approval before scheduling.  Shoulder pain Chronic left shoulder pain due to metastatic involvement in the left shoulder and scapula, worsening at night and affecting sleep. - Continue gabapentin  and ibuprofen  for pain management. - Consider using a supportive pillow for shoulder comfort during sleep.  Arthralgia Joint pain in knees and hips, possibly exacerbated by Letrozole , affecting daily activities. - Switch from Letrozole  to Exemestane  if insurance copay is reasonable due to lower incidence of joint pain. - Encourage staying active to help with joint pain and fatigue.  Plan - I personally reviewed her restaging PET scan images with patient and her husband, she has no other active  disease except bone lesion in the left scapula which is slightly worse than last scan. - I will discuss with Dr. Izell to see if she is a  candidate for more radiation to her left scapular bone mets - Change letrozole  to exemestane  due to her arthralgia - Continue Ibrance  - Lab and follow-up in 6 weeks, plan to repeat PET scan in 3 months. - Zometa  due in September   SUMMARY OF ONCOLOGIC HISTORY: Oncology History Overview Note  Cancer Staging Malignant neoplasm of upper-inner quadrant of left breast in female, estrogen receptor positive (HCC) Staging form: Breast, AJCC 8th Edition - Clinical stage from 04/13/2017: Stage IIA (cT2, cN0, cM0, G2, ER: Positive, PR: Negative, HER2: Negative) - Signed by [name], MD on 04/25/2017 - Pathologic stage from 06/13/2017: Stage IIB (pT2, pN1a, cM0, G1, ER: Positive, PR: Negative, HER2: Negative) - Signed by [name], MD on 09/04/2017     Malignant neoplasm of upper-inner quadrant of left breast in female, estrogen receptor positive (HCC)  04/10/2017 Mammogram   Left breast mammo and US  showed an irregular hypoechoic shadowing mass at 9 o'clock 1 cm from the nipple measuring 2.3 x 1 x 2.4 cm. This corresponds well with the mass seen at mammography. No lymphadenopathy seen in the left axilla.   04/18/2017 Initial Diagnosis   Malignant neoplasm of upper-inner quadrant of left breast in female, estrogen receptor positive (HCC)   04/22/2017 Receptors her2   Estrogen Receptor: 95%, POSITIVE, STRONG STAINING INTENSITY Progesterone Receptor: 0%, NEGATIVE Proliferation Marker Ki67: 10% HER2 -   04/22/2017 Initial Biopsy   Breast, left, needle core biopsy, 9:30 o'clock - INVASIVE DUCTAL CARCINOMA, G2 - DUCTAL CARCINOMA IN SITU.   06/13/2017 Surgery   Left breast mastectomy with sentinel lymph node biopsy performed by Dr Curvin.    06/13/2017 Pathology Results   Breast, simple mastectomy, Left - INVASIVE DUCTAL CARCINOMA, GRADE I/III, SPANNING 2.5 CM. - DUCTAL CARCINOMA IN SITU, INTERMEDIATE GRADE. - LOBULAR NEOPLASIA (ATYPICAL LOBULAR HYPERPLASIA). - THE SURGICAL RESECTION MARGINS ARE NEGATIVE  FOR CARCINOMA.  Lymph node, sentinel, biopsy, Left axillary #1 - METASTATIC CARCINOMA IN 1 OF 3 LYMPH NODE (1/3), WITH EXTRACAPSULAR EXTENSION.   06/13/2017 Pathology Results   Mammaprint with low risk disease.    07/31/2017 - 09/10/2017 Radiation Therapy   The patient began a course of radiotherapy over the left chest wall and axillary area, supervised by Dr Lauraine Izell, MD.     Radiation treatment dates:   07/31/2017 - 09/10/2017   Site/dose:    1) Left Chest Wall / 50 Gy in 25 fractions 2) Left Supraclavicular / 45 Gy in 25 fractions 3) Left Chest Wall Scar Boost / 10 Gy in 5 fractions   Beams/energy:    1) Opposed tangents 3D/ 10 and 6 MV photons 2) Additional fields 3D / 10 MV and 6 MV photons 3) En face electrons / 6 MeV electrons   Narrative: The patient tolerated radiation treatment relatively well. She experienced mild fatigue. She reported pain, burning, and itching to the left breast and swelling under the left arm. On physical exam, she had dry peeling in the left UIQ of the breast and left axilla, as well as diffuse hyperpigmentation and erythema of the left chest wall. She is using the radiaplex cream to the treatment area and neosporin to the peeling areas.   10/13/2017 -  Anti-estrogen oral therapy   Tamoxifen  10mg  daily   04/11/2018 Mammogram   04/11/2018 Mammogram IMPRESSION: No mammographic evidence of malignancy. A  result letter of this screening mammogram will be mailed directly to the patient.   04/11/2018 Mammogram   Screening Unilateral Right: No evidence of mammographic malignancy.       Discussed the use of AI scribe software for clinical note transcription with the patient, who gave verbal consent to proceed.  History of Present Illness Kristina Nicholson is a 58 year old female with metastatic breast cancer who presents for follow-up.  She experiences left shoulder pain, particularly at the shoulder blade, which disrupts her sleep. The pain  worsens when lying on the left side at night but improves when lying on the right side. Gabapentin  aids sleep, and ibuprofen  is used for pain management, but sleep difficulties persist.  Her metastatic breast cancer involves bone metastases in the left humeral head and left scapula, with persistent activity in the left rib area. She has undergone radiation therapy twice, with the last session in November of the previous year.  She experiences joint pain in her legs, particularly in the knees and hips, described as a constant ache. She has a history of hip surgery.  She is currently on Ibrance  and Letrozole  for cancer treatment and has previously taken tamoxifen  for ten years. She experiences fatigue, which she attributes to her medication regimen, but maintains her daily activities, including work.     All other systems were reviewed with the patient and are negative.  MEDICAL HISTORY:  Past Medical History:  Diagnosis Date   Chronic left shoulder pain    Headache    History of external beam radiation therapy    07-31-2017  to  09-10-2017  LEft chest wass 50 gy in 25 fractions, Left Supraclavicular 45 Gy in 25 fractions, Left Chest wall scar boost 10 Gy in 5 fractions.   Horseshoe kidney    Malignant neoplasm of upper-inner quadrant of left breast in female, estrogen receptor positive (HCC) 04/18/2017   oncologist--- dr lanny;   dx 06/ 2018 ,  Stage IIB, ER/PR+, G3, IDC/ DCIS;  06-13-2017 s/p left mastectomy w/ node dissection ;  completed radiation 09-10-2017;   recurrence w/ mets to bone 07/ 2024 , started chemotherapy   Metastatic cancer to bone (HCC) 05/2023   Uterine fibroid     SURGICAL HISTORY: Past Surgical History:  Procedure Laterality Date   APPENDECTOMY  2005   COLONOSCOPY  2016   GANGLION CYST EXCISION Right 09/24/2017   Procedure: EXCISION OF RIGHT DORSAL WRIST GANGLION;  Surgeon: Sebastian Lenis, MD;  Location: [facility];  Service: Orthopedics;   Laterality: Right;   LAPAROSCOPIC SALPINGO OOPHERECTOMY Bilateral 08/24/2023   Procedure: LAPAROSCOPIC SALPINGO OOPHORECTOMY;  Surgeon: Dannielle Bouchard, DO;  Location: [facility];  Service: Gynecology;  Laterality: Bilateral;   LIPOMA EXCISION Left 06/13/2017   Procedure: EXCISION 3CM LIPOMA ON LEFT ARM;  Surgeon: [name], MD;  Location: [facility] OR;  Service: General;  Laterality: Left;   MASTECTOMY W/ SENTINEL NODE BIOPSY Left 06/13/2017   Procedure: MASTECTOMY WITH SENTINEL LYMPH NODE BIOPSY;  Surgeon: [name], MD;  Location: [facility] OR;  Service: General;  Laterality: Left;   TOTAL HIP ARTHROPLASTY Right 09/30/2023   Procedure: TOTAL HIP ARTHROPLASTY ANTERIOR APPROACH;  Surgeon: Ernie Cough, MD;  Location: [facility];  Service: Orthopedics;  Laterality: Right;    I have reviewed the social history and family history with the patient and they are unchanged from previous note.  ALLERGIES:  has no known allergies.  MEDICATIONS:  Current Outpatient Medications  Medication  Sig Dispense Refill   exemestane  (AROMASIN ) 25 MG tablet Take 1 tablet (25 mg total) by mouth daily after breakfast. 30 tablet 2   gabapentin  (NEURONTIN ) 300 MG capsule Take 1 capsule (300 mg total) by mouth 3 (three) times daily. 90 capsule 1   loperamide (IMODIUM A-D) 2 MG tablet Take 2 mg by mouth 4 (four) times daily as needed for diarrhea or loose stools.     Multiple Vitamins-Minerals (MULTIVITAMIN WOMEN PO) Take 1 tablet by mouth daily with breakfast.     Nutritional Supplements (CHLORELLA-SPIRULINA COMPLEX PO) Take 1 capsule by mouth daily as needed (to aid digestive health).     palbociclib  (IBRANCE ) 125 MG tablet Take 1 tablet (125 mg total) by mouth daily. Take for 21 days on, 7 days off, repeat every 28 days. 21 tablet 0   No current facility-administered medications for this visit.    PHYSICAL EXAMINATION: ECOG PERFORMANCE STATUS: 1 - Symptomatic but completely ambulatory  Vitals:    06/02/24 1041  BP: (!) 108/56  Pulse: 72  Resp: 15  Temp: 97.6 F (36.4 C)  SpO2: 98%   Wt Readings from Last 3 Encounters:  06/02/24 130 lb 6.4 oz (59.1 kg)  05/05/24 131 lb 8 oz (59.6 kg)  02/12/24 131 lb 9.6 oz (59.7 kg)     GENERAL:alert, no distress and comfortable SKIN: skin color, texture, turgor are normal, no rashes or significant lesions EYES: normal, Conjunctiva are pink and non-injected, sclera clear NECK: supple, thyroid  normal size, non-tender, without nodularity LYMPH:  no palpable lymphadenopathy in the cervical, axillary  LUNGS: clear to auscultation and percussion with normal breathing effort HEART: regular rate & rhythm and no murmurs and no lower extremity edema ABDOMEN:abdomen soft, non-tender and normal bowel sounds Musculoskeletal:no cyanosis of digits and no clubbing  NEURO: alert & oriented x 3 with fluent speech, no focal motor/sensory deficits  Physical Exam    LABORATORY DATA:  I have reviewed the data as listed    Latest Ref Rng & Units 06/02/2024    9:36 AM 05/05/2024    9:11 AM 02/12/2024    9:01 AM  CBC  WBC 4.0 - 10.5 K/uL 2.1  2.1  1.8   Hemoglobin 12.0 - 15.0 g/dL 88.6  88.6  88.9   Hematocrit 36.0 - 46.0 % 32.6  32.3  32.1   Platelets 150 - 400 K/uL 174  184  164         Latest Ref Rng & Units 06/02/2024    9:36 AM 05/05/2024    9:11 AM 02/12/2024    9:01 AM  CMP  Glucose 70 - 99 mg/dL 871  894  844   BUN 6 - 20 mg/dL 12  10  10    Creatinine 0.44 - 1.00 mg/dL 9.18  9.23  9.19   Sodium 135 - 145 mmol/L 142  141  143   Potassium 3.5 - 5.1 mmol/L 3.7  3.7  3.5   Chloride 98 - 111 mmol/L 109  110  107   CO2 22 - 32 mmol/L 29  25  26    Calcium 8.9 - 10.3 mg/dL 8.7  8.8  9.1   Total Protein 6.5 - 8.1 g/dL 6.6  6.9  6.7   Total Bilirubin 0.0 - 1.2 mg/dL 0.4  0.4  0.4   Alkaline Phos 38 - 126 U/L 46  46  51   AST 15 - 41 U/L 14  14  13    ALT 0 - 44 U/L 9  11  10       RADIOGRAPHIC STUDIES: I have personally reviewed the  radiological images as listed and agreed with the findings in the report. No results found.    No orders of the defined types were placed in this encounter.  All questions were answered. The patient knows to call the clinic with any problems, questions or concerns. No barriers to learning was detected. The total time spent in the appointment was 40 minutes, including review of chart and various tests results, discussions about plan of care and coordination of care plan     Onita Mattock, MD 06/02/2024

## 2024-06-03 ENCOUNTER — Other Ambulatory Visit: Payer: Self-pay | Admitting: Hematology

## 2024-06-03 LAB — CANCER ANTIGEN 27.29: CA 27.29: 28.1 U/mL (ref 0.0–38.6)

## 2024-06-04 ENCOUNTER — Other Ambulatory Visit (HOSPITAL_COMMUNITY): Payer: Self-pay

## 2024-06-04 NOTE — Progress Notes (Signed)
 Histology and Location of Primary Cancer:  Malignant Neoplasm of Upper-Inner Quadrant of Left Breast, Estrogen Receptor Positive  Metastatic Breast Cancer with Bone Metastasis in the left shoulder and scapula.  Sites of Visceral and Bony Metastatic Disease:  Left Shoulder and Scapula  Location(s) of Symptomatic Metastases:  Lanny, MD 06/02/2024 Patient was experiencing worsening left shoulder pain  Past/Anticipated chemotherapy by medical oncology, if any:  06/02/2024 Lanny, MD  Pain on a scale of 0-10 is:  Patient experiencing severe shoulder pain    If Spine Met(s), symptoms, if any, include: Bowel/Bladder retention or incontinence (please describe): None Numbness or weakness in extremities (please describe): Sometimes experiences numbness on extremities Current Decadron  regimen, if applicable: None  Ambulatory status? Walker? Wheelchair?: Ambulatory  SAFETY ISSUES: Prior radiation? Yes Pacemaker/ICD? None Possible current pregnancy? None Is the patient on methotrexate? None  Current Complaints / other details:  None  BP 138/69 (BP Location: Right Arm, Patient Position: Sitting, Cuff Size: Normal)   Pulse 68   Temp (!) 97.3 F (36.3 C)   Resp 18   Ht 5' 3 (1.6 m)   Wt 130 lb 12.8 oz (59.3 kg)   LMP 06/22/2015 Comment: 06/08/17 vaginal bleeding for 4 days  SpO2 97%   BMI 23.17 kg/m   Wt Readings from Last 3 Encounters:  06/09/24 130 lb 12.8 oz (59.3 kg)  06/02/24 130 lb 6.4 oz (59.1 kg)  05/05/24 131 lb 8 oz (59.6 kg)

## 2024-06-07 NOTE — Progress Notes (Signed)
 Radiation Oncology         [phone] ________________________________  Outpatient Re-Consultation  Name: Kristina Nicholson MRN: [id number]  Date: 06/09/2024  DOB: 1966-01-30  CC:Pcp, No  [name], MD   REFERRING PHYSICIAN: [name], MD  DIAGNOSIS: No diagnosis found.  Wide spread osseous metastatic disease from a left breast cancer primary - painful left scapular lesion   Initially diagnosed with left breast cancer in June 2018 - Left Breast LIQ Invasive Ductal Carcinoma and DCIS, ER Positive / PR Negative / Her2 Negative, Grade 2: s/p left mastectomy, XRT, and antiestrogen therapy.    Cancer Staging  Malignant neoplasm of upper-inner quadrant of left breast in female, estrogen receptor positive (HCC) Staging form: Breast, AJCC 8th Edition - Clinical stage from 04/13/2017: Stage IIA (cT2, cN0, cM0, G2, ER+, PR-, HER2-) - Signed by [name], MD on 04/25/2017 Nuclear grade: G2 Histologic grading system: 3 grade system Laterality: Left - Pathologic stage from 06/13/2017: Stage IIB (pT2, pN1a, cM0, G1, ER+, PR-, HER2-) - Signed by [name], MD on 09/04/2017 Neoadjuvant therapy: No Nuclear grade: G1 Multigene prognostic tests performed: MammaPrint Histologic grading system: 3 grade system Residual tumor (R): R0 Laterality: Left   CHIEF COMPLAINT: Here to discuss management of progressive osseous metastatic disease to the left scapula from a left breast cancer primary  HISTORY OF PRESENT ILLNESS::Kristina Nicholson is a 58 y.o. female who is known to us  to her history of metastatic left breast cancer. She was last seen by nursing for a follow-up visit on 11/01/23 after completing radiation therapy to the osseous metastatic disease in her left humerus on 09/28/23. To review: the day after her final radiation treatment, she presented to the ED with worsening right hip pain in the setting of her painful right femoral head lesion. She was ultimately admitted and underwent a right  total hip replacement on 09/30/23.   Since her last visit with us  on 11/01/23, she continued to receive daily letrozole  and Verzenio  (along with zometa  infusions) under the care of Dr. Lanny. A restaging PET scan was also performed shortly after that time on 11/08/23 which showed an excellent treatment response to her osseous metastases, characterized by: near complete resolution of metabolic activity associated with the left humeral head metastasis, an interval decrease in metabolic activity associated with the left scapular metastasis, and no new sites of disease overall.   Although she initially tolerated Verzenio  relatively well, she endorsed significant gastrointestinal side effects from Verzenio  during a follow-up visit with Dr. Lanny on 12/26/23. Verzenio  was subsequently discontinued at that time and changed to [facility]. Unfortunately, she again developed significant side effects with Kisqali and was transitioned to Ibrance  on 01/14/24.   Her most recent restaging PET scan on 05/26/24 demonstrated an increase in extent and level of uptake intensity along the known left scapular bone lesion. PET otherwise showed no new areas of abnormal bony uptake, and stable posttreatment changes along the left breast s/p mastectomy and radiation therapy.  Concurrent with her recent PET findings, she was noted to endorse worsening left shoulder pain during her most recent visit with Dr. Lanny on 06/02/24. She characterizes her pain as worse at night and severe enough to effect her sleep. In light of her pain, Dr. Lanny has referred her back to us  for consideration of radiation therapy to the left scapular lesion. In the mean time, Dr. Lanny has advised her to continue on her current pain management regimen of gabapentin  and ibuprofen  for this.  With regards to her systemic therapy, she has tolerated Ibrance  well thus far and has subsequently agreed to an increase in dosage to enhance treatment efficacy. She will  otherwise continue with letrozole  and zometa  infusions at the same dosages.   PREVIOUS RADIATION THERAPY: Yes   2) Radiation Treatment Dates: First Treatment Date: 2023-09-17 - Last Treatment Date: 2023-09-28 Site/Dose/Technique/Mode:  Plan Name: Ext_L_Hum Site: Humerus, Left Technique: 3D Mode: Photon Dose Per Fraction: 3 Gy Prescribed Dose (Delivered / Prescribed): 30 Gy / 30 Gy Prescribed Fxs (Delivered / Prescribed): 10 / 10  1) Diagnosis:   58 y.o. female with Clinical Stage T2 N0 M0  Left Breast LIQ Invasive Ductal Carcinoma and DCIS, ER Positive / PR Negative / Her2 Negative, Grade 2, Pathologic Stage pT2 pN1a cM0 Indication for treatment:     Curative  Radiation treatment dates:   07/31/2017 - 09/10/2017 Site/dose:    1) Left Chest Wall / 50 Gy in 25 fractions 2) Left Supraclavicular / 45 Gy in 25 fractions 3) Left Chest Wall Scar Boost / 10 Gy in 5 fractions Beams/energy:    1) Opposed tangents 3D/ 10 and 6 MV photons 2) Additional fields 3D / 10 MV and 6 MV photons 3) En face electrons / 6 MeV electrons   PAST MEDICAL HISTORY:  has a past medical history of Chronic left shoulder pain, Headache, History of external beam radiation therapy, Horseshoe kidney, Malignant neoplasm of upper-inner quadrant of left breast in female, estrogen receptor positive (HCC) (04/18/2017), Metastatic cancer to bone (HCC) (05/2023), and Uterine fibroid.    PAST SURGICAL HISTORY: Past Surgical History:  Procedure Laterality Date   APPENDECTOMY  2005   COLONOSCOPY  2016   GANGLION CYST EXCISION Right 09/24/2017   Procedure: EXCISION OF RIGHT DORSAL WRIST GANGLION;  Surgeon: Sebastian Lenis, MD;  Location: [facility];  Service: Orthopedics;  Laterality: Right;   LAPAROSCOPIC SALPINGO OOPHERECTOMY Bilateral 08/24/2023   Procedure: LAPAROSCOPIC SALPINGO OOPHORECTOMY;  Surgeon: Dannielle Bouchard, DO;  Location: [facility];  Service: Gynecology;  Laterality: Bilateral;    LIPOMA EXCISION Left 06/13/2017   Procedure: EXCISION 3CM LIPOMA ON LEFT ARM;  Surgeon: [name], MD;  Location: [facility] OR;  Service: General;  Laterality: Left;   MASTECTOMY W/ SENTINEL NODE BIOPSY Left 06/13/2017   Procedure: MASTECTOMY WITH SENTINEL LYMPH NODE BIOPSY;  Surgeon: [name], MD;  Location: [facility] OR;  Service: General;  Laterality: Left;   TOTAL HIP ARTHROPLASTY Right 09/30/2023   Procedure: TOTAL HIP ARTHROPLASTY ANTERIOR APPROACH;  Surgeon: Ernie Cough, MD;  Location: [facility];  Service: Orthopedics;  Laterality: Right;    FAMILY HISTORY: family history includes Colon cancer in her brother.  SOCIAL HISTORY:  reports that she has been smoking cigarettes. She started smoking about 39 years ago. She has a 8 pack-year smoking history. She has never used smokeless tobacco. She reports that she does not drink alcohol and does not use drugs.  ALLERGIES: Patient has no known allergies.  MEDICATIONS:  Current Outpatient Medications  Medication Sig Dispense Refill   exemestane  (AROMASIN ) 25 MG tablet Take 1 tablet (25 mg total) by mouth daily after breakfast. 30 tablet 2   gabapentin  (NEURONTIN ) 300 MG capsule Take 1 capsule (300 mg total) by mouth 3 (three) times daily. 90 capsule 1   IBRANCE  125 MG tablet TAKE 1 TABLET DAILY FOR 21 DAYS ON, 7 DAYS OFF, REPEAT EVERY 28 DAYS 21 tablet 0   loperamide (IMODIUM A-D) 2 MG tablet Take  2 mg by mouth 4 (four) times daily as needed for diarrhea or loose stools.     Multiple Vitamins-Minerals (MULTIVITAMIN WOMEN PO) Take 1 tablet by mouth daily with breakfast.     Nutritional Supplements (CHLORELLA-SPIRULINA COMPLEX PO) Take 1 capsule by mouth daily as needed (to aid digestive health).     No current facility-administered medications for this encounter.    REVIEW OF SYSTEMS:  Notable for that above.   PHYSICAL EXAM:  vitals were not taken for this visit.   General: Alert and oriented, in no acute distress *** HEENT: Head is  normocephalic. Extraocular movements are intact. Oropharynx is clear. Neck: Neck is supple, no palpable cervical or supraclavicular lymphadenopathy. Heart: Regular in rate and rhythm with no murmurs, rubs, or gallops. Chest: Clear to auscultation bilaterally, with no rhonchi, wheezes, or rales. Abdomen: Soft, nontender, nondistended, with no rigidity or guarding. Extremities: No cyanosis or edema. Lymphatics: see Neck Exam Skin: No concerning lesions. Musculoskeletal: symmetric strength and muscle tone throughout. Neurologic: Cranial nerves II through XII are grossly intact. No obvious focalities. Speech is fluent. Coordination is intact. Psychiatric: Judgment and insight are intact. Affect is appropriate.   ECOG = ***  0 - Asymptomatic (Fully active, able to carry on all predisease activities without restriction)  1 - Symptomatic but completely ambulatory (Restricted in physically strenuous activity but ambulatory and able to carry out work of a light or sedentary nature. For example, light housework, office work)  2 - Symptomatic, <50% in bed during the day (Ambulatory and capable of all self care but unable to carry out any work activities. Up and about more than 50% of waking hours)  3 - Symptomatic, >50% in bed, but not bedbound (Capable of only limited self-care, confined to bed or chair 50% or more of waking hours)  4 - Bedbound (Completely disabled. Cannot carry on any self-care. Totally confined to bed or chair)  5 - Death   Raylene MM, Creech RH, Tormey DC, et al. [phone]). Toxicity and response criteria of the [facility]. Am. DOROTHA Bridges. Oncol. 5 (6): 649-55   LABORATORY DATA:  Lab Results  Component Value Date   WBC 2.1 (L) 06/02/2024   HGB 11.3 (L) 06/02/2024   HCT 32.6 (L) 06/02/2024   MCV 102.2 (H) 06/02/2024   PLT 174 06/02/2024   CMP     Component Value Date/Time   NA 142 06/02/2024 0936   NA 141 11/09/2017 0900   K 3.7 06/02/2024 0936    K 4.5 11/09/2017 0900   CL 109 06/02/2024 0936   CO2 29 06/02/2024 0936   CO2 28 11/09/2017 0900   GLUCOSE 128 (H) 06/02/2024 0936   GLUCOSE 98 11/09/2017 0900   BUN 12 06/02/2024 0936   BUN 11.9 11/09/2017 0900   CREATININE 0.81 06/02/2024 0936   CREATININE 0.61 10/15/2023 1515   CREATININE 0.8 11/09/2017 0900   CALCIUM 8.7 (L) 06/02/2024 0936   CALCIUM 9.1 11/09/2017 0900   PROT 6.6 06/02/2024 0936   PROT 6.8 11/09/2017 0900   ALBUMIN  4.0 06/02/2024 0936   ALBUMIN  3.8 11/09/2017 0900   AST 14 (L) 06/02/2024 0936   AST 17 10/15/2023 1515   AST 14 11/09/2017 0900   ALT 9 06/02/2024 0936   ALT 17 10/15/2023 1515   ALT 10 11/09/2017 0900   ALKPHOS 46 06/02/2024 0936   ALKPHOS 57 11/09/2017 0900   BILITOT 0.4 06/02/2024 0936   BILITOT 0.3 10/15/2023 1515   BILITOT 0.64 11/09/2017 0900  EGFR >60 11/09/2017 0900   GFRNONAA >60 06/02/2024 0936   GFRNONAA >60 10/15/2023 1515         RADIOGRAPHY: NM PET Image Restag (PS) Skull Base To Thigh Result Date: 05/28/2024 CLINICAL DATA:  Subsequent treatment strategy for breast cancer. EXAM: NUCLEAR MEDICINE PET SKULL BASE TO THIGH TECHNIQUE: 6.55 mCi F-18 FDG was injected intravenously. Full-ring PET imaging was performed from the skull base to thigh after the radiotracer. CT data was obtained and used for attenuation correction and anatomic localization. Fasting blood glucose: 96 mg/dl COMPARISON:  PET-CT scan 11/08/2023.  Older exams as well FINDINGS: Mediastinal blood pool activity: SUV max 2.8 Liver activity: SUV max 3.0 NECK: No specific abnormal uptake seen in the neck including along lymph node change of the submandibular, posterior triangle or internal jugular region. Near symmetric uptake of the visualized intracranial compartment. Incidental CT findings: The parotid glands, submandibular glands and thyroid  glands unremarkable. Paranasal sinuses and mastoid air cells are clear. CHEST: No abnormal uptake above blood pool in the axillary  regions, hilum or mediastinum. No abnormal lung uptake. Minimal uptake along the lower esophagus, nonspecific. Incidental CT findings: Surgical changes of left mastectomy. Surgical clips in left axillary region as well. The thoracic aorta has a normal course and caliber. Minimal scattered vascular calcifications including along the coronary arteries. Please correlate for other coronary risk factors. Breathing motion. There is some linear opacity at the bases likely scar or atelectasis. There is pleural thickening with interstitial thickening along the anterior left hemithorax which is unchanged from previous and could be scarring and fibrosis. Please correlate for left breast/chest wall radiation. This was seen previously. ABDOMEN/PELVIS: No abnormal hypermetabolic activity within the liver, pancreas, adrenal glands, or spleen. No hypermetabolic lymph nodes in the abdomen or pelvis. Incidental CT findings: There is a horseshoe kidney, congenital variant. No abnormal calcifications seen within either portion of the kidney nor along the course of either ureter. Preserved contour to the bladder. Lobular uterus. Mild scattered vascular calcifications. Segment 3 hepatic cystic foci again identified as well smaller foci on segment 4. Grossly the adrenal glands, pancreas and spleen are preserved on this noncontrast exam is limited. Gallbladder is present. Bowel is nondilated. Scattered colonic stool. Stomach and small bowel are nondilated. SKELETON: Once again there is some heterogeneous uptake along the lesion involving the inferior aspect of the left scapula. Maximum SUV value of 4.2 and today maximum SUV of 10.1. The extent of the uptake is increasing, extending more superiorly and on the prior. No other areas of abnormal uptake along the visualized osseous structures. Sclerotic lesion along the left humeral head is again noted and has no significant abnormal uptake. Incidental CT findings: Streak artifact related to  the patient's right hip arthroplasty. Scattered degenerative changes. IMPRESSION: Increasing extent and level of uptake intensity along the left scapular bone lesion. No new areas of abnormal bony uptake. No new soft tissue or nodal uptake. Stable posttreatment changes along the left breast from mastectomy and presumed radiation. Electronically Signed   By: Ranell Bring M.D.   On: 05/28/2024 13:36      IMPRESSION/PLAN:***    On date of service, in total, I spent *** minutes on this encounter. Patient was seen in person.   __________________________________________   Lauraine Golden, MD  This document serves as a record of services personally performed by Lauraine Golden, MD. It was created on her behalf by Dorthy Fuse, a trained medical scribe. The creation of this record is based on the scribe's  personal observations and the provider's statements to them. This document has been checked and approved by the attending provider.

## 2024-06-09 ENCOUNTER — Ambulatory Visit
Admission: RE | Admit: 2024-06-09 | Discharge: 2024-06-09 | Disposition: A | Discharge status: Home / Self Care | Source: Ambulatory Visit | Attending: Radiation Oncology | Admitting: Radiation Oncology

## 2024-06-09 ENCOUNTER — Encounter: Payer: Self-pay | Admitting: Radiation Oncology

## 2024-06-09 VITALS — BP 138/69 | HR 68 | Temp 97.3°F | Resp 18 | Ht 63.0 in | Wt 130.8 lb

## 2024-06-09 DIAGNOSIS — Z79899 Other long term (current) drug therapy: Secondary | ICD-10-CM | POA: Diagnosis not present

## 2024-06-09 DIAGNOSIS — Z79811 Long term (current) use of aromatase inhibitors: Secondary | ICD-10-CM | POA: Insufficient documentation

## 2024-06-09 DIAGNOSIS — G893 Neoplasm related pain (acute) (chronic): Secondary | ICD-10-CM | POA: Insufficient documentation

## 2024-06-09 DIAGNOSIS — F17210 Nicotine dependence, cigarettes, uncomplicated: Secondary | ICD-10-CM | POA: Insufficient documentation

## 2024-06-09 DIAGNOSIS — D0512 Intraductal carcinoma in situ of left breast: Secondary | ICD-10-CM | POA: Diagnosis not present

## 2024-06-09 DIAGNOSIS — Z9012 Acquired absence of left breast and nipple: Secondary | ICD-10-CM | POA: Diagnosis not present

## 2024-06-09 DIAGNOSIS — Z170 Estrogen receptor positive status [ER+]: Secondary | ICD-10-CM | POA: Insufficient documentation

## 2024-06-09 DIAGNOSIS — C7951 Secondary malignant neoplasm of bone: Secondary | ICD-10-CM | POA: Insufficient documentation

## 2024-06-09 DIAGNOSIS — C50912 Malignant neoplasm of unspecified site of left female breast: Secondary | ICD-10-CM | POA: Diagnosis not present

## 2024-06-09 DIAGNOSIS — Q631 Lobulated, fused and horseshoe kidney: Secondary | ICD-10-CM | POA: Diagnosis not present

## 2024-06-09 DIAGNOSIS — C50212 Malignant neoplasm of upper-inner quadrant of left female breast: Secondary | ICD-10-CM | POA: Insufficient documentation

## 2024-06-13 ENCOUNTER — Ambulatory Visit
Admission: RE | Admit: 2024-06-13 | Discharge: 2024-06-13 | Disposition: A | Discharge status: Home / Self Care | Source: Ambulatory Visit | Attending: Radiation Oncology | Admitting: Radiation Oncology

## 2024-06-13 DIAGNOSIS — C7951 Secondary malignant neoplasm of bone: Secondary | ICD-10-CM | POA: Diagnosis not present

## 2024-06-13 DIAGNOSIS — Z170 Estrogen receptor positive status [ER+]: Secondary | ICD-10-CM | POA: Diagnosis not present

## 2024-06-13 DIAGNOSIS — D0512 Intraductal carcinoma in situ of left breast: Secondary | ICD-10-CM | POA: Insufficient documentation

## 2024-06-13 DIAGNOSIS — C50912 Malignant neoplasm of unspecified site of left female breast: Secondary | ICD-10-CM | POA: Diagnosis not present

## 2024-06-16 ENCOUNTER — Other Ambulatory Visit: Payer: Self-pay | Admitting: Nurse Practitioner

## 2024-06-16 DIAGNOSIS — Z515 Encounter for palliative care: Secondary | ICD-10-CM

## 2024-06-16 DIAGNOSIS — G893 Neoplasm related pain (acute) (chronic): Secondary | ICD-10-CM

## 2024-06-16 DIAGNOSIS — C7951 Secondary malignant neoplasm of bone: Secondary | ICD-10-CM

## 2024-06-17 ENCOUNTER — Other Ambulatory Visit (HOSPITAL_COMMUNITY): Payer: Self-pay

## 2024-06-17 MED ORDER — GABAPENTIN 300 MG PO CAPS
300.0000 mg | ORAL_CAPSULE | Freq: Three times a day (TID) | ORAL | 3 refills | Status: DC
  Filled 2024-06-17: qty 90, 30d supply, fill #0
  Filled 2024-07-16: qty 90, 30d supply, fill #1
  Filled 2024-08-15: qty 90, 30d supply, fill #2
  Filled 2024-09-15: qty 90, 30d supply, fill #3

## 2024-06-23 ENCOUNTER — Other Ambulatory Visit: Payer: Self-pay

## 2024-06-23 ENCOUNTER — Ambulatory Visit
Admission: RE | Admit: 2024-06-23 | Discharge: 2024-06-23 | Disposition: A | Discharge status: Home / Self Care | Source: Ambulatory Visit | Attending: Radiation Oncology | Admitting: Radiation Oncology

## 2024-06-23 DIAGNOSIS — Z510 Encounter for antineoplastic radiation therapy: Secondary | ICD-10-CM | POA: Diagnosis not present

## 2024-06-23 DIAGNOSIS — C50912 Malignant neoplasm of unspecified site of left female breast: Secondary | ICD-10-CM | POA: Diagnosis not present

## 2024-06-23 DIAGNOSIS — C7951 Secondary malignant neoplasm of bone: Secondary | ICD-10-CM | POA: Diagnosis not present

## 2024-06-23 DIAGNOSIS — D0512 Intraductal carcinoma in situ of left breast: Secondary | ICD-10-CM | POA: Diagnosis not present

## 2024-06-23 DIAGNOSIS — Z170 Estrogen receptor positive status [ER+]: Secondary | ICD-10-CM | POA: Diagnosis not present

## 2024-06-23 LAB — RAD ONC ARIA SESSION SUMMARY
Course Elapsed Days: 0
Plan Fractions Treated to Date: 1
Plan Prescribed Dose Per Fraction: 4 Gy
Plan Total Fractions Prescribed: 10
Plan Total Prescribed Dose: 40 Gy
Reference Point Dosage Given to Date: 4 Gy
Reference Point Session Dosage Given: 4 Gy
Session Number: 1

## 2024-06-24 ENCOUNTER — Ambulatory Visit
Admission: RE | Admit: 2024-06-24 | Discharge: 2024-06-24 | Disposition: A | Discharge status: Home / Self Care | Source: Ambulatory Visit | Attending: Radiation Oncology | Admitting: Radiation Oncology

## 2024-06-24 ENCOUNTER — Other Ambulatory Visit: Payer: Self-pay

## 2024-06-24 DIAGNOSIS — C50912 Malignant neoplasm of unspecified site of left female breast: Secondary | ICD-10-CM | POA: Diagnosis not present

## 2024-06-24 DIAGNOSIS — D0512 Intraductal carcinoma in situ of left breast: Secondary | ICD-10-CM | POA: Diagnosis not present

## 2024-06-24 DIAGNOSIS — Z170 Estrogen receptor positive status [ER+]: Secondary | ICD-10-CM | POA: Diagnosis not present

## 2024-06-24 DIAGNOSIS — Z510 Encounter for antineoplastic radiation therapy: Secondary | ICD-10-CM | POA: Diagnosis not present

## 2024-06-24 DIAGNOSIS — C7951 Secondary malignant neoplasm of bone: Secondary | ICD-10-CM | POA: Diagnosis not present

## 2024-06-24 LAB — RAD ONC ARIA SESSION SUMMARY
Course Elapsed Days: 1
Plan Fractions Treated to Date: 2
Plan Prescribed Dose Per Fraction: 4 Gy
Plan Total Fractions Prescribed: 10
Plan Total Prescribed Dose: 40 Gy
Reference Point Dosage Given to Date: 8 Gy
Reference Point Session Dosage Given: 4 Gy
Session Number: 2

## 2024-06-25 ENCOUNTER — Other Ambulatory Visit: Payer: Self-pay

## 2024-06-25 ENCOUNTER — Ambulatory Visit
Admission: RE | Admit: 2024-06-25 | Discharge: 2024-06-25 | Disposition: A | Discharge status: Home / Self Care | Source: Ambulatory Visit | Attending: Radiation Oncology | Admitting: Radiation Oncology

## 2024-06-25 DIAGNOSIS — Z170 Estrogen receptor positive status [ER+]: Secondary | ICD-10-CM | POA: Diagnosis not present

## 2024-06-25 DIAGNOSIS — C7951 Secondary malignant neoplasm of bone: Secondary | ICD-10-CM | POA: Diagnosis not present

## 2024-06-25 DIAGNOSIS — D0512 Intraductal carcinoma in situ of left breast: Secondary | ICD-10-CM | POA: Diagnosis not present

## 2024-06-25 DIAGNOSIS — Z510 Encounter for antineoplastic radiation therapy: Secondary | ICD-10-CM | POA: Diagnosis not present

## 2024-06-25 DIAGNOSIS — C50912 Malignant neoplasm of unspecified site of left female breast: Secondary | ICD-10-CM | POA: Diagnosis not present

## 2024-06-25 LAB — RAD ONC ARIA SESSION SUMMARY
Course Elapsed Days: 2
Plan Fractions Treated to Date: 3
Plan Prescribed Dose Per Fraction: 4 Gy
Plan Total Fractions Prescribed: 10
Plan Total Prescribed Dose: 40 Gy
Reference Point Dosage Given to Date: 12 Gy
Reference Point Session Dosage Given: 4 Gy
Session Number: 3

## 2024-06-26 ENCOUNTER — Ambulatory Visit
Admission: RE | Admit: 2024-06-26 | Discharge: 2024-06-26 | Disposition: A | Discharge status: Home / Self Care | Source: Ambulatory Visit | Attending: Radiation Oncology | Admitting: Radiation Oncology

## 2024-06-26 ENCOUNTER — Other Ambulatory Visit: Payer: Self-pay

## 2024-06-26 ENCOUNTER — Inpatient Hospital Stay: Admitting: Nurse Practitioner

## 2024-06-26 DIAGNOSIS — Z510 Encounter for antineoplastic radiation therapy: Secondary | ICD-10-CM | POA: Diagnosis not present

## 2024-06-26 DIAGNOSIS — D0512 Intraductal carcinoma in situ of left breast: Secondary | ICD-10-CM | POA: Diagnosis not present

## 2024-06-26 DIAGNOSIS — Z170 Estrogen receptor positive status [ER+]: Secondary | ICD-10-CM | POA: Diagnosis not present

## 2024-06-26 DIAGNOSIS — C7951 Secondary malignant neoplasm of bone: Secondary | ICD-10-CM | POA: Diagnosis not present

## 2024-06-26 DIAGNOSIS — C50912 Malignant neoplasm of unspecified site of left female breast: Secondary | ICD-10-CM | POA: Diagnosis not present

## 2024-06-26 LAB — RAD ONC ARIA SESSION SUMMARY
Course Elapsed Days: 3
Plan Fractions Treated to Date: 4
Plan Prescribed Dose Per Fraction: 4 Gy
Plan Total Fractions Prescribed: 10
Plan Total Prescribed Dose: 40 Gy
Reference Point Dosage Given to Date: 16 Gy
Reference Point Session Dosage Given: 4 Gy
Session Number: 4

## 2024-06-27 ENCOUNTER — Ambulatory Visit
Admission: RE | Admit: 2024-06-27 | Discharge: 2024-06-27 | Disposition: A | Discharge status: Home / Self Care | Source: Ambulatory Visit | Attending: Radiation Oncology | Admitting: Radiation Oncology

## 2024-06-27 ENCOUNTER — Other Ambulatory Visit: Payer: Self-pay

## 2024-06-27 DIAGNOSIS — D0512 Intraductal carcinoma in situ of left breast: Secondary | ICD-10-CM | POA: Diagnosis not present

## 2024-06-27 DIAGNOSIS — Z510 Encounter for antineoplastic radiation therapy: Secondary | ICD-10-CM | POA: Diagnosis not present

## 2024-06-27 DIAGNOSIS — C50912 Malignant neoplasm of unspecified site of left female breast: Secondary | ICD-10-CM | POA: Diagnosis not present

## 2024-06-27 DIAGNOSIS — Z170 Estrogen receptor positive status [ER+]: Secondary | ICD-10-CM | POA: Diagnosis not present

## 2024-06-27 DIAGNOSIS — C7951 Secondary malignant neoplasm of bone: Secondary | ICD-10-CM | POA: Diagnosis not present

## 2024-06-27 LAB — RAD ONC ARIA SESSION SUMMARY
Course Elapsed Days: 4
Plan Fractions Treated to Date: 5
Plan Prescribed Dose Per Fraction: 4 Gy
Plan Total Fractions Prescribed: 10
Plan Total Prescribed Dose: 40 Gy
Reference Point Dosage Given to Date: 20 Gy
Reference Point Session Dosage Given: 4 Gy
Session Number: 5

## 2024-06-30 ENCOUNTER — Telehealth: Payer: Self-pay

## 2024-06-30 ENCOUNTER — Other Ambulatory Visit: Payer: Self-pay

## 2024-06-30 ENCOUNTER — Other Ambulatory Visit: Payer: Self-pay | Admitting: Hematology

## 2024-06-30 ENCOUNTER — Ambulatory Visit
Admission: RE | Admit: 2024-06-30 | Discharge: 2024-06-30 | Discharge status: Home / Self Care | Source: Ambulatory Visit | Attending: Radiation Oncology

## 2024-06-30 ENCOUNTER — Ambulatory Visit
Admission: RE | Admit: 2024-06-30 | Discharge: 2024-06-30 | Disposition: A | Discharge status: Home / Self Care | Source: Ambulatory Visit | Attending: Radiation Oncology | Admitting: Radiation Oncology

## 2024-06-30 ENCOUNTER — Other Ambulatory Visit (HOSPITAL_COMMUNITY): Payer: Self-pay

## 2024-06-30 DIAGNOSIS — Z510 Encounter for antineoplastic radiation therapy: Secondary | ICD-10-CM | POA: Diagnosis not present

## 2024-06-30 DIAGNOSIS — Z170 Estrogen receptor positive status [ER+]: Secondary | ICD-10-CM | POA: Diagnosis not present

## 2024-06-30 DIAGNOSIS — D0512 Intraductal carcinoma in situ of left breast: Secondary | ICD-10-CM | POA: Diagnosis not present

## 2024-06-30 DIAGNOSIS — C7951 Secondary malignant neoplasm of bone: Secondary | ICD-10-CM | POA: Diagnosis not present

## 2024-06-30 DIAGNOSIS — C50912 Malignant neoplasm of unspecified site of left female breast: Secondary | ICD-10-CM | POA: Diagnosis not present

## 2024-06-30 LAB — RAD ONC ARIA SESSION SUMMARY
Course Elapsed Days: 7
Plan Fractions Treated to Date: 6
Plan Prescribed Dose Per Fraction: 4 Gy
Plan Total Fractions Prescribed: 10
Plan Total Prescribed Dose: 40 Gy
Reference Point Dosage Given to Date: 24 Gy
Reference Point Session Dosage Given: 4 Gy
Session Number: 6

## 2024-06-30 MED ORDER — LETROZOLE 2.5 MG PO TABS
2.5000 mg | ORAL_TABLET | Freq: Every day | ORAL | 2 refills | Status: DC
  Filled 2024-06-30: qty 30, 30d supply, fill #0
  Filled 2024-08-15: qty 30, 30d supply, fill #1
  Filled 2024-09-15: qty 30, 30d supply, fill #2

## 2024-06-30 MED ORDER — HYDROCODONE-ACETAMINOPHEN 5-325 MG PO TABS
1.0000 | ORAL_TABLET | Freq: Three times a day (TID) | ORAL | 0 refills | Status: DC | PRN
  Filled 2024-06-30: qty 10, 4d supply, fill #0

## 2024-06-30 NOTE — Telephone Encounter (Signed)
 Pt's husband called stating pt's c/o stiffness in her hands since she's been on the Exemestane  and would like to be switched back on the Letrozole .  Stated this nurse will make Dr. Lanny aware of the pt's request.

## 2024-06-30 NOTE — Telephone Encounter (Signed)
 LVM stating that Dr. Lanny has called in the prescription for Letrozole  for the pt to restart (see previous telephone message with pt).  Stated Dr. Lanny also prescribed pain medication for the pt as well r/t a message sent to Dr. Lanny today while the pt was in radiation.  Requested the pt to give Dr. Demetra office a call should she have additional questions or concerns.

## 2024-07-01 ENCOUNTER — Other Ambulatory Visit: Payer: Self-pay

## 2024-07-01 ENCOUNTER — Ambulatory Visit
Admission: RE | Admit: 2024-07-01 | Discharge: 2024-07-01 | Disposition: A | Discharge status: Home / Self Care | Source: Ambulatory Visit | Attending: Radiation Oncology | Admitting: Radiation Oncology

## 2024-07-01 ENCOUNTER — Other Ambulatory Visit: Payer: Self-pay | Admitting: Hematology

## 2024-07-01 DIAGNOSIS — C50912 Malignant neoplasm of unspecified site of left female breast: Secondary | ICD-10-CM | POA: Diagnosis not present

## 2024-07-01 DIAGNOSIS — C7951 Secondary malignant neoplasm of bone: Secondary | ICD-10-CM | POA: Diagnosis not present

## 2024-07-01 DIAGNOSIS — Z510 Encounter for antineoplastic radiation therapy: Secondary | ICD-10-CM | POA: Diagnosis not present

## 2024-07-01 DIAGNOSIS — Z170 Estrogen receptor positive status [ER+]: Secondary | ICD-10-CM | POA: Diagnosis not present

## 2024-07-01 DIAGNOSIS — D0512 Intraductal carcinoma in situ of left breast: Secondary | ICD-10-CM | POA: Diagnosis not present

## 2024-07-01 LAB — RAD ONC ARIA SESSION SUMMARY
Course Elapsed Days: 8
Plan Fractions Treated to Date: 7
Plan Prescribed Dose Per Fraction: 4 Gy
Plan Total Fractions Prescribed: 10
Plan Total Prescribed Dose: 40 Gy
Reference Point Dosage Given to Date: 28 Gy
Reference Point Session Dosage Given: 4 Gy
Session Number: 7

## 2024-07-02 ENCOUNTER — Other Ambulatory Visit: Payer: Self-pay

## 2024-07-02 ENCOUNTER — Ambulatory Visit
Admission: RE | Admit: 2024-07-02 | Discharge: 2024-07-02 | Disposition: A | Discharge status: Home / Self Care | Source: Ambulatory Visit | Attending: Radiation Oncology

## 2024-07-02 DIAGNOSIS — D0512 Intraductal carcinoma in situ of left breast: Secondary | ICD-10-CM | POA: Diagnosis not present

## 2024-07-02 DIAGNOSIS — Z510 Encounter for antineoplastic radiation therapy: Secondary | ICD-10-CM | POA: Diagnosis not present

## 2024-07-02 DIAGNOSIS — C7951 Secondary malignant neoplasm of bone: Secondary | ICD-10-CM | POA: Diagnosis not present

## 2024-07-02 DIAGNOSIS — Z170 Estrogen receptor positive status [ER+]: Secondary | ICD-10-CM | POA: Diagnosis not present

## 2024-07-02 DIAGNOSIS — C50912 Malignant neoplasm of unspecified site of left female breast: Secondary | ICD-10-CM | POA: Diagnosis not present

## 2024-07-02 LAB — RAD ONC ARIA SESSION SUMMARY
Course Elapsed Days: 9
Plan Fractions Treated to Date: 8
Plan Prescribed Dose Per Fraction: 4 Gy
Plan Total Fractions Prescribed: 10
Plan Total Prescribed Dose: 40 Gy
Reference Point Dosage Given to Date: 32 Gy
Reference Point Session Dosage Given: 4 Gy
Session Number: 8

## 2024-07-03 ENCOUNTER — Ambulatory Visit
Admission: RE | Admit: 2024-07-03 | Discharge: 2024-07-03 | Disposition: A | Discharge status: Home / Self Care | Source: Ambulatory Visit | Attending: Radiation Oncology | Admitting: Radiation Oncology

## 2024-07-03 ENCOUNTER — Other Ambulatory Visit: Payer: Self-pay

## 2024-07-03 DIAGNOSIS — D0512 Intraductal carcinoma in situ of left breast: Secondary | ICD-10-CM | POA: Diagnosis not present

## 2024-07-03 DIAGNOSIS — C7951 Secondary malignant neoplasm of bone: Secondary | ICD-10-CM | POA: Diagnosis not present

## 2024-07-03 DIAGNOSIS — Z170 Estrogen receptor positive status [ER+]: Secondary | ICD-10-CM | POA: Diagnosis not present

## 2024-07-03 LAB — RAD ONC ARIA SESSION SUMMARY
Course Elapsed Days: 10
Plan Fractions Treated to Date: 9
Plan Prescribed Dose Per Fraction: 4 Gy
Plan Total Fractions Prescribed: 10
Plan Total Prescribed Dose: 40 Gy
Reference Point Dosage Given to Date: 36 Gy
Reference Point Session Dosage Given: 4 Gy
Session Number: 9

## 2024-07-04 ENCOUNTER — Other Ambulatory Visit: Payer: Self-pay

## 2024-07-04 ENCOUNTER — Ambulatory Visit
Admission: RE | Admit: 2024-07-04 | Discharge: 2024-07-04 | Disposition: A | Discharge status: Home / Self Care | Source: Ambulatory Visit | Attending: Radiation Oncology | Admitting: Radiation Oncology

## 2024-07-04 ENCOUNTER — Telehealth: Payer: Self-pay | Admitting: Radiation Oncology

## 2024-07-04 DIAGNOSIS — C7951 Secondary malignant neoplasm of bone: Secondary | ICD-10-CM | POA: Diagnosis not present

## 2024-07-04 DIAGNOSIS — C50912 Malignant neoplasm of unspecified site of left female breast: Secondary | ICD-10-CM | POA: Diagnosis not present

## 2024-07-04 DIAGNOSIS — D0512 Intraductal carcinoma in situ of left breast: Secondary | ICD-10-CM | POA: Diagnosis not present

## 2024-07-04 DIAGNOSIS — Z510 Encounter for antineoplastic radiation therapy: Secondary | ICD-10-CM | POA: Diagnosis not present

## 2024-07-04 DIAGNOSIS — Z170 Estrogen receptor positive status [ER+]: Secondary | ICD-10-CM | POA: Diagnosis not present

## 2024-07-04 LAB — RAD ONC ARIA SESSION SUMMARY
Course Elapsed Days: 11
Plan Fractions Treated to Date: 10
Plan Prescribed Dose Per Fraction: 4 Gy
Plan Total Fractions Prescribed: 10
Plan Total Prescribed Dose: 40 Gy
Reference Point Dosage Given to Date: 40 Gy
Reference Point Session Dosage Given: 4 Gy
Session Number: 10

## 2024-07-04 NOTE — Telephone Encounter (Signed)
 8/22 Patient and spouse stop by office to cancel her FU20 appt for 9/16.   Patient stated do not feel she need this appointment at this time.  Will call back if need to r/s.

## 2024-07-07 NOTE — Radiation Completion Notes (Signed)
 Patient Name: Kristina Nicholson, Kristina Nicholson MRN: [id number] Date of Birth: 1966-04-24 Referring Physician: ONITA MATTOCK, M.D. Date of Service: 2024-07-07 Radiation Oncologist: Lauraine Golden, M.D. [facility]                             RADIATION ONCOLOGY END OF TREATMENT NOTE     Diagnosis: C79.51 Secondary malignant neoplasm of bone Staging on 2017-06-13: Malignant neoplasm of upper-inner quadrant of left breast in female, estrogen receptor positive (HCC) T=pT2, N=pN1a, M=cM0 Staging on 2017-04-13: Malignant neoplasm of upper-inner quadrant of left breast in female, estrogen receptor positive (HCC) T=cT2, N=cN0, M=cM0 Intent: Palliative     ==========DELIVERED PLANS==========  First Treatment Date: 2024-06-23 Last Treatment Date: 2024-07-04   Plan Name: Chest_L_Scap Site: Scapula, Left Technique: IMRT Mode: Photon Dose Per Fraction: 4 Gy Prescribed Dose (Delivered / Prescribed): 40 Gy / 40 Gy Prescribed Fxs (Delivered / Prescribed): 10 / 10     ==========ON TREATMENT VISIT DATES========== 2024-06-23, 2024-06-30     ==========UPCOMING VISITS========== 07/15/2024 [facility]  07/15/2024 [facility] EST PT 20 MATTOCK ONITA, MD        ==========APPENDIX - ON TREATMENT VISIT NOTES==========   See weekly On Treatment Notes in Epic for details in the Media tab (listed as Progress notes on the On Treatment Visit Dates listed above).

## 2024-07-15 ENCOUNTER — Inpatient Hospital Stay: Admitting: Hematology

## 2024-07-15 ENCOUNTER — Inpatient Hospital Stay

## 2024-07-29 ENCOUNTER — Ambulatory Visit: Admitting: Radiology

## 2024-08-03 NOTE — Assessment & Plan Note (Addendum)
 [name], stage IIB, ER: Positive, PR: Negative, HER2: Negative, Grade 3, bone metastasis in 05/2023 -She was diagnosed in 04/2017. She is s/p left breast mastectomy and adjuvant radiation. Mammaprint showed low risk. -She started antiestrogen therapy with Tamoxifen  in 10/2017. She is tolerating well, plan for 10 year. She has not had a menstrual period since 2018.  Her FSH in March 2021 showed that she is still premenopausal --Due to worsening left shoulder pain, she underwent CT and bone scan in [place] during her trip. Unfortunately scan showed multiple bone lesion and uptake in sternum, cervical spine, left scapular, left 10th rib, and left hip, concerning for bone mets. She underwent bone biopsy of left scapula and sternum, both confirmed metastatic breast cancer, ER positive (weak to moderate in left scapula biopsy, and 80% strong positive in sternal biopsy), PR weak to moderate positive, HER2 2+ in left scapular, 1+ in sternal bone biopsy -PET scan from June 21, 2023 showed multifocal hypermetabolic osseous metastatic disease. No definite pathologic fracture or epidural tumor identified.  No visceral metastasis.   -she started fulvestrant  injection and Verzenio  on August 13, 2023.  Due to poor tolerance to Verzenio , dose was reduced to 100 mg twice daily -She underwent BSO on August 24, 2023, surgical path was benign. -Due to severe right hip pain from bone metastasis, she underwent right hip replacement on September 30, 2023 -Restaging PET scan November 08, 2023 showed excellent response in bone metastasis.  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 -Due to her GI side effect, I changed Verzenio  to Kisqali 600mg  daily 3 weeks on and one week off in mid Feb 2025, and it was changed to Ibrance  in mid March due to poor tolerance. She tolerates Ibrance  much better  -PET 05/26/24 showed bone met progression in left scapular, other bone mets are  well controlled. She underwent RT to left scapula

## 2024-08-04 ENCOUNTER — Inpatient Hospital Stay (HOSPITAL_BASED_OUTPATIENT_CLINIC_OR_DEPARTMENT_OTHER): Admitting: Hematology

## 2024-08-04 ENCOUNTER — Telehealth: Payer: Self-pay | Admitting: Hematology

## 2024-08-04 ENCOUNTER — Inpatient Hospital Stay: Attending: Hematology

## 2024-08-04 VITALS — BP 114/60 | HR 77 | Temp 97.5°F | Resp 17 | Ht 63.0 in | Wt 131.5 lb

## 2024-08-04 DIAGNOSIS — Z9012 Acquired absence of left breast and nipple: Secondary | ICD-10-CM | POA: Diagnosis not present

## 2024-08-04 DIAGNOSIS — E538 Deficiency of other specified B group vitamins: Secondary | ICD-10-CM | POA: Insufficient documentation

## 2024-08-04 DIAGNOSIS — Z170 Estrogen receptor positive status [ER+]: Secondary | ICD-10-CM

## 2024-08-04 DIAGNOSIS — C7951 Secondary malignant neoplasm of bone: Secondary | ICD-10-CM | POA: Diagnosis not present

## 2024-08-04 DIAGNOSIS — Z9221 Personal history of antineoplastic chemotherapy: Secondary | ICD-10-CM | POA: Insufficient documentation

## 2024-08-04 DIAGNOSIS — Z923 Personal history of irradiation: Secondary | ICD-10-CM | POA: Insufficient documentation

## 2024-08-04 DIAGNOSIS — Z79811 Long term (current) use of aromatase inhibitors: Secondary | ICD-10-CM | POA: Insufficient documentation

## 2024-08-04 DIAGNOSIS — C50212 Malignant neoplasm of upper-inner quadrant of left female breast: Secondary | ICD-10-CM

## 2024-08-04 LAB — CBC WITH DIFFERENTIAL/PLATELET
Abs Immature Granulocytes: 0 K/uL (ref 0.00–0.07)
Basophils Absolute: 0 K/uL (ref 0.0–0.1)
Basophils Relative: 1 %
Eosinophils Absolute: 0.1 K/uL (ref 0.0–0.5)
Eosinophils Relative: 2 %
HCT: 31.3 % — ABNORMAL LOW (ref 36.0–46.0)
Hemoglobin: 10.9 g/dL — ABNORMAL LOW (ref 12.0–15.0)
Immature Granulocytes: 0 %
Lymphocytes Relative: 32 %
Lymphs Abs: 0.8 K/uL (ref 0.7–4.0)
MCH: 36.2 pg — ABNORMAL HIGH (ref 26.0–34.0)
MCHC: 34.8 g/dL (ref 30.0–36.0)
MCV: 104 fL — ABNORMAL HIGH (ref 80.0–100.0)
Monocytes Absolute: 0.6 K/uL (ref 0.1–1.0)
Monocytes Relative: 25 %
Neutro Abs: 0.9 K/uL — ABNORMAL LOW (ref 1.7–7.7)
Neutrophils Relative %: 40 %
Platelets: 208 K/uL (ref 150–400)
RBC: 3.01 MIL/uL — ABNORMAL LOW (ref 3.87–5.11)
RDW: 14.8 % (ref 11.5–15.5)
WBC: 2.3 K/uL — ABNORMAL LOW (ref 4.0–10.5)
nRBC: 0 % (ref 0.0–0.2)

## 2024-08-04 LAB — COMPREHENSIVE METABOLIC PANEL WITH GFR
ALT: 9 U/L (ref 0–44)
AST: 14 U/L — ABNORMAL LOW (ref 15–41)
Albumin: 4.2 g/dL (ref 3.5–5.0)
Alkaline Phosphatase: 48 U/L (ref 38–126)
Anion gap: 5 (ref 5–15)
BUN: 10 mg/dL (ref 6–20)
CO2: 29 mmol/L (ref 22–32)
Calcium: 9 mg/dL (ref 8.9–10.3)
Chloride: 108 mmol/L (ref 98–111)
Creatinine, Ser: 0.69 mg/dL (ref 0.44–1.00)
GFR, Estimated: >60 mL/min (ref >60)
Glucose, Bld: 92 mg/dL (ref 70–99)
Potassium: 3.9 mmol/L (ref 3.5–5.1)
Sodium: 142 mmol/L (ref 135–145)
Total Bilirubin: 0.4 mg/dL (ref 0.0–1.2)
Total Protein: 6.9 g/dL (ref 6.5–8.1)

## 2024-08-04 MED ORDER — PALBOCICLIB 125 MG PO TABS: 125.0000 mg | ORAL_TABLET | Freq: Every day | ORAL | 2 refills | Status: DC

## 2024-08-04 NOTE — Telephone Encounter (Signed)
Called and schedule appts.

## 2024-08-04 NOTE — Progress Notes (Signed)
 [facility]   [phone] [phone]   Clinic Follow up Note   Patient Care Team: Pcp, No as PCP - General Kristina Callander, MD as Consulting Physician (Hematology) [name], MD as Consulting Physician (General Surgery) [name], MD as Attending Physician (Radiation Oncology) Pickenpack-Cousar, [name], NP as Nurse Practitioner (Hospice and Palliative Medicine)  Date of Service:  08/04/2024  CHIEF COMPLAINT: f/u of metastatic breast cancer  CURRENT THERAPY:  Ibrance  and letrozole   Oncology History   Malignant neoplasm of upper-inner quadrant of left breast in female, estrogen receptor positive (HCC) [name], stage IIB, ER: Positive, PR: Negative, HER2: Negative, Grade 3, bone metastasis in 05/2023 -She was diagnosed in 04/2017. She is s/p left breast mastectomy and adjuvant radiation. Mammaprint showed low risk. -She started antiestrogen therapy with Tamoxifen  in 10/2017. She is tolerating well, plan for 10 year. She has not had a menstrual period since 2018.  Her FSH in March 2021 showed that she is still premenopausal --Due to worsening left shoulder pain, she underwent CT and bone scan in [place] during her trip. Unfortunately scan showed multiple bone lesion and uptake in sternum, cervical spine, left scapular, left 10th rib, and left hip, concerning for bone mets. She underwent bone biopsy of left scapula and sternum, both confirmed metastatic breast cancer, ER positive (weak to moderate in left scapula biopsy, and 80% strong positive in sternal biopsy), PR weak to moderate positive, HER2 2+ in left scapular, 1+ in sternal bone biopsy -PET scan from June 21, 2023 showed multifocal hypermetabolic osseous metastatic disease. No definite pathologic fracture or epidural tumor identified.  No visceral metastasis.   -she started fulvestrant  injection and Verzenio  on August 13, 2023.  Due to poor tolerance to Verzenio , dose was reduced to 100 mg twice  daily -She underwent BSO on August 24, 2023, surgical path was benign. -Due to severe right hip pain from bone metastasis, she underwent right hip replacement on September 30, 2023 -Restaging PET scan November 08, 2023 showed excellent response in bone metastasis.  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 -Due to her GI side effect, I changed Verzenio  to Kisqali 600mg  daily 3 weeks on and one week off in mid Feb 2025, and it was changed to Ibrance  in mid March due to poor tolerance. She tolerates Ibrance  much better  -PET 05/26/24 showed bone met progression in left scapular, other bone mets are well controlled. She underwent RT to left scapula    Assessment & Plan Metastatic breast cancer Metastatic breast cancer managed with Ibrance  and Letrozole . Blood counts show mild leukopenia and anemia, likely secondary to therapy. No diarrhea reported. Discussion about turmeric intake and its potential interaction with Ibrance ; advised to reduce turmeric intake to a small amount to avoid interference with medication. - Continue Ibrance  and Letrozole  therapy - Advise reducing turmeric intake to a small amount - Discuss potential side effects of Ibrance  and Letrozole , including joint pain and stiffness  Mild leukopenia and anemia secondary to cancer therapy Mild leukopenia and anemia, likely secondary to Ibrance  and Letrozole  therapy. Blood counts do not require transfusion. Advised to be cautious about infections. - Advise on infection prevention measures - Recommend flu vaccination  Arthralgia likely secondary to cancer therapy Arthralgia and stiffness, likely secondary to Ibrance  and Letrozole  therapy. Symptoms include joint pain and stiffness, particularly in the knees. Advised on the importance of exercise to alleviate symptoms. - Encourage regular exercise, including walking, biking,  and using dumbbells - Consider over-the-counter  supplements like glucosamine for joint pain  Radiation dermatitis Radiation dermatitis with itching on the back following completion of radiation therapy on July 04, 2024. Symptoms include itching and a sensation of not being able to scratch effectively. - Apply hydrocortisone and Benadryl  cream to affected area  Vitamin B12 deficiency Vitamin B12 deficiency with previous low levels. Discussion on appropriate dosing of B12 supplements, with consideration of higher doses due to previous low levels. - Start Vitamin B12 supplementation at 2500 mcg daily - Recheck B12 levels in two months  Plan - She is tolerating letrozole  and Ibrance  well, will continue.  I reviewed her Ibrance  today. -zometa  next week  -She will start oral B12 now, and I will check her level next time. - Follow-up in 2 months with lab.   SUMMARY OF ONCOLOGIC HISTORY: Oncology History Overview Note  Cancer Staging Malignant neoplasm of upper-inner quadrant of left breast in female, estrogen receptor positive (HCC) Staging form: Breast, AJCC 8th Edition - Clinical stage from 04/13/2017: Stage IIA (cT2, cN0, cM0, G2, ER: Positive, PR: Negative, HER2: Negative) - Signed by Kristina Callander, MD on 04/25/2017 - Pathologic stage from 06/13/2017: Stage IIB (pT2, pN1a, cM0, G1, ER: Positive, PR: Negative, HER2: Negative) - Signed by Kristina Callander, MD on 09/04/2017     Malignant neoplasm of upper-inner quadrant of left breast in female, estrogen receptor positive (HCC)  04/10/2017 Mammogram   Left breast mammo and US  showed an irregular hypoechoic shadowing mass at 9 o'clock 1 cm from the nipple measuring 2.3 x 1 x 2.4 cm. This corresponds well with the mass seen at mammography. No lymphadenopathy seen in the left axilla.   04/18/2017 Initial Diagnosis   Malignant neoplasm of upper-inner quadrant of left breast in female, estrogen receptor positive (HCC)   04/22/2017 Receptors her2   Estrogen Receptor: 95%, POSITIVE, STRONG STAINING  INTENSITY Progesterone Receptor: 0%, NEGATIVE Proliferation Marker Ki67: 10% HER2 -   04/22/2017 Initial Biopsy   Breast, left, needle core biopsy, 9:30 o'clock - INVASIVE DUCTAL CARCINOMA, G2 - DUCTAL CARCINOMA IN SITU.   06/13/2017 Surgery   Left breast mastectomy with sentinel lymph node biopsy performed by Dr Curvin.    06/13/2017 Pathology Results   Breast, simple mastectomy, Left - INVASIVE DUCTAL CARCINOMA, GRADE I/III, SPANNING 2.5 CM. - DUCTAL CARCINOMA IN SITU, INTERMEDIATE GRADE. - LOBULAR NEOPLASIA (ATYPICAL LOBULAR HYPERPLASIA). - THE SURGICAL RESECTION MARGINS ARE NEGATIVE FOR CARCINOMA.  Lymph node, sentinel, biopsy, Left axillary #1 - METASTATIC CARCINOMA IN 1 OF 3 LYMPH NODE (1/3), WITH EXTRACAPSULAR EXTENSION.   06/13/2017 Pathology Results   Mammaprint with low risk disease.    07/31/2017 - 09/10/2017 Radiation Therapy   The patient began a course of radiotherapy over the left chest wall and axillary area, supervised by Dr Lauraine Golden, MD.     Radiation treatment dates:   07/31/2017 - 09/10/2017   Site/dose:    1) Left Chest Wall / 50 Gy in 25 fractions 2) Left Supraclavicular / 45 Gy in 25 fractions 3) Left Chest Wall Scar Boost / 10 Gy in 5 fractions   Beams/energy:    1) Opposed tangents 3D/ 10 and 6 MV photons 2) Additional fields 3D / 10 MV and 6 MV photons 3) En face electrons / 6 MeV electrons   Narrative: The patient tolerated radiation treatment relatively well. She experienced mild fatigue. She reported pain, burning, and itching to the left breast and swelling under the left arm. On physical exam,  she had dry peeling in the left UIQ of the breast and left axilla, as well as diffuse hyperpigmentation and erythema of the left chest wall. She is using the radiaplex cream to the treatment area and neosporin to the peeling areas.   10/13/2017 -  Anti-estrogen oral therapy   Tamoxifen  10mg  daily   04/11/2018 Mammogram   04/11/2018 Mammogram IMPRESSION: No  mammographic evidence of malignancy. A result letter of this screening mammogram will be mailed directly to the patient.   04/11/2018 Mammogram   Screening Unilateral Right: No evidence of mammographic malignancy.       Discussed the use of AI scribe software for clinical note transcription with the patient, who gave verbal consent to proceed.  History of Present Illness Kristina Nicholson is a 58 year old female with metastatic breast cancer who presents for follow-up.  She completed radiation therapy on July 04, 2024, and experiences significant pruritus on her back, described as feeling like 'stripes'. She uses hydrocortisone and Benadryl  cream for relief.  She experiences knee warmth and soreness, along with a general lack of strength. Despite these symptoms, she continues to work and feels fatigued after work.  She is currently taking Ibrance  and Letrozole , which can cause joint pain and stiffness. She experiences these symptoms, particularly when inactive, and finds that movement helps alleviate them. She uses dumbbells and engages in lifting exercises at home.  She consumes turmeric in her tea, which she believes might interfere with Ibrance . Her recipe includes turmeric, pepper, garlic, cinnamon, and anise, and she is cautious about the amount of turmeric used.  No diarrhea while on Ibrance .     All other systems were reviewed with the patient and are negative.  MEDICAL HISTORY:  Past Medical History:  Diagnosis Date   Chronic left shoulder pain    Headache    History of external beam radiation therapy    07-31-2017  to  09-10-2017  LEft chest wass 50 gy in 25 fractions, Left Supraclavicular 45 Gy in 25 fractions, Left Chest wall scar boost 10 Gy in 5 fractions.   Horseshoe kidney    Malignant neoplasm of upper-inner quadrant of left breast in female, estrogen receptor positive (HCC) 04/18/2017   oncologist--- dr Kristina;   dx 06/ 2018 ,  Stage IIB, ER/PR+, G3, IDC/ DCIS;   06-13-2017 s/p left mastectomy w/ node dissection ;  completed radiation 09-10-2017;   recurrence w/ mets to bone 07/ 2024 , started chemotherapy   Metastatic cancer to bone (HCC) 05/2023   Uterine fibroid     SURGICAL HISTORY: Past Surgical History:  Procedure Laterality Date   APPENDECTOMY  2005   COLONOSCOPY  2016   GANGLION CYST EXCISION Right 09/24/2017   Procedure: EXCISION OF RIGHT DORSAL WRIST GANGLION;  Surgeon: Sebastian Lenis, MD;  Location: [facility];  Service: Orthopedics;  Laterality: Right;   LAPAROSCOPIC SALPINGO OOPHERECTOMY Bilateral 08/24/2023   Procedure: LAPAROSCOPIC SALPINGO OOPHORECTOMY;  Surgeon: Dannielle Bouchard, DO;  Location: [facility];  Service: Gynecology;  Laterality: Bilateral;   LIPOMA EXCISION Left 06/13/2017   Procedure: EXCISION 3CM LIPOMA ON LEFT ARM;  Surgeon: [name], MD;  Location: [facility] OR;  Service: General;  Laterality: Left;   MASTECTOMY W/ SENTINEL NODE BIOPSY Left 06/13/2017   Procedure: MASTECTOMY WITH SENTINEL LYMPH NODE BIOPSY;  Surgeon: [name], MD;  Location: [facility] OR;  Service: General;  Laterality: Left;   TOTAL HIP ARTHROPLASTY Right 09/30/2023   Procedure: TOTAL HIP ARTHROPLASTY ANTERIOR  APPROACH;  Surgeon: Ernie Cough, MD;  Location: [facility];  Service: Orthopedics;  Laterality: Right;    I have reviewed the social history and family history with the patient and they are unchanged from previous note.  ALLERGIES:  has no known allergies.  MEDICATIONS:  Current Outpatient Medications  Medication Sig Dispense Refill   gabapentin  (NEURONTIN ) 300 MG capsule Take 1 capsule (300 mg total) by mouth 3 (three) times daily. 90 capsule 3   HYDROcodone -acetaminophen  (NORCO/VICODIN) 5-325 MG tablet Take 1 tablet by mouth every 8 (eight) hours as needed for moderate pain (pain score 4-6) or severe pain (pain score 7-10). 10 tablet 0   letrozole  (FEMARA ) 2.5 MG tablet Take 1 tablet (2.5 mg total) by mouth  daily. 30 tablet 2   loperamide (IMODIUM A-D) 2 MG tablet Take 2 mg by mouth 4 (four) times daily as needed for diarrhea or loose stools.     Multiple Vitamins-Minerals (MULTIVITAMIN WOMEN PO) Take 1 tablet by mouth daily with breakfast.     Nutritional Supplements (CHLORELLA-SPIRULINA COMPLEX PO) Take 1 capsule by mouth daily as needed (to aid digestive health).     palbociclib  (IBRANCE ) 125 MG tablet Take 1 tablet (125 mg total) by mouth daily. Take for 21 days on, 7 days off, repeat every 28 days. 21 tablet 2   No current facility-administered medications for this visit.    PHYSICAL EXAMINATION: ECOG PERFORMANCE STATUS: 1 - Symptomatic but completely ambulatory  Vitals:   08/04/24 0814  BP: 114/60  Pulse: 77  Resp: 17  Temp: (!) 97.5 F (36.4 C)  SpO2: 95%   Wt Readings from Last 3 Encounters:  08/04/24 131 lb 8 oz (59.6 kg)  06/09/24 130 lb 12.8 oz (59.3 kg)  06/02/24 130 lb 6.4 oz (59.1 kg)     GENERAL:alert, no distress and comfortable SKIN: skin color, texture, turgor are normal, no rashes or significant lesions EYES: normal, Conjunctiva are pink and non-injected, sclera clear NECK: supple, thyroid  normal size, non-tender, without nodularity LYMPH:  no palpable lymphadenopathy in the cervical, axillary  LUNGS: clear to auscultation and percussion with normal breathing effort HEART: regular rate & rhythm and no murmurs and no lower extremity edema ABDOMEN:abdomen soft, non-tender and normal bowel sounds Musculoskeletal:no cyanosis of digits and no clubbing  NEURO: alert & oriented x 3 with fluent speech, no focal motor/sensory deficits  Physical Exam    LABORATORY DATA:  I have reviewed the data as listed    Latest Ref Rng & Units 08/04/2024    7:46 AM 06/02/2024    9:36 AM 05/05/2024    9:11 AM  CBC  WBC 4.0 - 10.5 K/uL 2.3  2.1  2.1   Hemoglobin 12.0 - 15.0 g/dL 89.0  88.6  88.6   Hematocrit 36.0 - 46.0 % 31.3  32.6  32.3   Platelets 150 - 400 K/uL 208  174   184         Latest Ref Rng & Units 08/04/2024    7:46 AM 06/02/2024    9:36 AM 05/05/2024    9:11 AM  CMP  Glucose 70 - 99 mg/dL 92  871  894   BUN 6 - 20 mg/dL 10  12  10    Creatinine 0.44 - 1.00 mg/dL 9.30  9.18  9.23   Sodium 135 - 145 mmol/L 142  142  141   Potassium 3.5 - 5.1 mmol/L 3.9  3.7  3.7   Chloride 98 - 111 mmol/L 108  109  110   CO2 22 - 32 mmol/L 29  29  25    Calcium 8.9 - 10.3 mg/dL 9.0  8.7  8.8   Total Protein 6.5 - 8.1 g/dL 6.9  6.6  6.9   Total Bilirubin 0.0 - 1.2 mg/dL 0.4  0.4  0.4   Alkaline Phos 38 - 126 U/L 48  46  46   AST 15 - 41 U/L 14  14  14    ALT 0 - 44 U/L 9  9  11        RADIOGRAPHIC STUDIES: I have personally reviewed the radiological images as listed and agreed with the findings in the report. No results found.    Orders Placed This Encounter  Procedures   Vitamin B12    Standing Status:   Standing    Number of Occurrences:   5    Expiration Date:   08/04/2025   All questions were answered. The patient knows to call the clinic with any problems, questions or concerns. No barriers to learning was detected. The total time spent in the appointment was 30 minutes, including review of chart and various tests results, discussions about plan of care and coordination of care plan     Onita Mattock, MD 08/04/2024

## 2024-08-18 ENCOUNTER — Inpatient Hospital Stay: Attending: Hematology

## 2024-08-18 VITALS — BP 123/63 | HR 72 | Temp 98.1°F | Resp 17 | Wt 132.5 lb

## 2024-08-18 DIAGNOSIS — C7951 Secondary malignant neoplasm of bone: Secondary | ICD-10-CM | POA: Insufficient documentation

## 2024-08-18 DIAGNOSIS — Z23 Encounter for immunization: Secondary | ICD-10-CM | POA: Insufficient documentation

## 2024-08-18 DIAGNOSIS — C50212 Malignant neoplasm of upper-inner quadrant of left female breast: Secondary | ICD-10-CM | POA: Diagnosis not present

## 2024-08-18 MED ORDER — ZOLEDRONIC ACID 4 MG/100ML IV SOLN
4.0000 mg | Freq: Once | INTRAVENOUS | Status: AC
  Administered 2024-08-18: 4 mg via INTRAVENOUS
  Filled 2024-08-18: qty 100

## 2024-08-18 MED ORDER — SODIUM CHLORIDE 0.9 % IV SOLN: Freq: Once | INTRAVENOUS | Status: AC

## 2024-08-18 MED ORDER — INFLUENZA VIRUS VACC SPLIT PF (FLUZONE) 0.5 ML IM SUSY
0.5000 mL | PREFILLED_SYRINGE | Freq: Once | INTRAMUSCULAR | Status: AC
  Administered 2024-08-18: 0.5 mL via INTRAMUSCULAR
  Filled 2024-08-18: qty 0.5

## 2024-08-18 NOTE — Patient Instructions (Signed)

## 2024-08-20 ENCOUNTER — Other Ambulatory Visit (HOSPITAL_COMMUNITY): Payer: Self-pay

## 2024-09-18 ENCOUNTER — Emergency Department (HOSPITAL_COMMUNITY)
Admission: EM | Admit: 2024-09-18 | Discharge: 2024-09-18 | Disposition: A | Discharge status: Home / Self Care | Source: Ambulatory Visit | Attending: Emergency Medicine | Admitting: Emergency Medicine

## 2024-09-18 ENCOUNTER — Emergency Department (HOSPITAL_COMMUNITY)

## 2024-09-18 ENCOUNTER — Other Ambulatory Visit: Payer: Self-pay

## 2024-09-18 ENCOUNTER — Encounter (HOSPITAL_COMMUNITY): Payer: Self-pay

## 2024-09-18 ENCOUNTER — Other Ambulatory Visit (HOSPITAL_COMMUNITY): Payer: Self-pay

## 2024-09-18 DIAGNOSIS — R052 Subacute cough: Secondary | ICD-10-CM | POA: Diagnosis not present

## 2024-09-18 DIAGNOSIS — Z853 Personal history of malignant neoplasm of breast: Secondary | ICD-10-CM | POA: Insufficient documentation

## 2024-09-18 DIAGNOSIS — R42 Dizziness and giddiness: Secondary | ICD-10-CM | POA: Insufficient documentation

## 2024-09-18 DIAGNOSIS — R051 Acute cough: Secondary | ICD-10-CM | POA: Diagnosis not present

## 2024-09-18 DIAGNOSIS — R059 Cough, unspecified: Secondary | ICD-10-CM | POA: Diagnosis not present

## 2024-09-18 DIAGNOSIS — R0602 Shortness of breath: Secondary | ICD-10-CM | POA: Diagnosis not present

## 2024-09-18 LAB — URINALYSIS, COMPLETE (UACMP) WITH MICROSCOPIC
Bacteria, UA: NONE SEEN
Bilirubin Urine: NEGATIVE
Glucose, UA: NEGATIVE mg/dL
Ketones, ur: NEGATIVE mg/dL
Nitrite: NEGATIVE
Protein, ur: NEGATIVE mg/dL
Specific Gravity, Urine: 1.016 (ref 1.005–1.030)
pH: 5 (ref 5.0–8.0)

## 2024-09-18 LAB — COMPREHENSIVE METABOLIC PANEL WITH GFR
ALT: 11 U/L (ref 0–44)
AST: 21 U/L (ref 15–41)
Albumin: 4.1 g/dL (ref 3.5–5.0)
Alkaline Phosphatase: 65 U/L (ref 38–126)
Anion gap: 11 (ref 5–15)
BUN: 12 mg/dL (ref 6–20)
CO2: 25 mmol/L (ref 22–32)
Calcium: 9.1 mg/dL (ref 8.9–10.3)
Chloride: 106 mmol/L (ref 98–111)
Creatinine, Ser: 0.77 mg/dL (ref 0.44–1.00)
GFR, Estimated: >60 mL/min (ref >60)
Glucose, Bld: 131 mg/dL — ABNORMAL HIGH (ref 70–99)
Potassium: 3.8 mmol/L (ref 3.5–5.1)
Sodium: 142 mmol/L (ref 135–145)
Total Bilirubin: 0.3 mg/dL (ref 0.0–1.2)
Total Protein: 7.1 g/dL (ref 6.5–8.1)

## 2024-09-18 LAB — CBC WITH DIFFERENTIAL/PLATELET
Abs Immature Granulocytes: 0.01 K/uL (ref 0.00–0.07)
Basophils Absolute: 0 K/uL (ref 0.0–0.1)
Basophils Relative: 1 %
Eosinophils Absolute: 0.1 K/uL (ref 0.0–0.5)
Eosinophils Relative: 3 %
HCT: 34.1 % — ABNORMAL LOW (ref 36.0–46.0)
Hemoglobin: 10.9 g/dL — ABNORMAL LOW (ref 12.0–15.0)
Immature Granulocytes: 1 %
Lymphocytes Relative: 28 %
Lymphs Abs: 0.6 K/uL — ABNORMAL LOW (ref 0.7–4.0)
MCH: 34.8 pg — ABNORMAL HIGH (ref 26.0–34.0)
MCHC: 32 g/dL (ref 30.0–36.0)
MCV: 108.9 fL — ABNORMAL HIGH (ref 80.0–100.0)
Monocytes Absolute: 0.2 K/uL (ref 0.1–1.0)
Monocytes Relative: 10 %
Neutro Abs: 1.3 K/uL — ABNORMAL LOW (ref 1.7–7.7)
Neutrophils Relative %: 57 %
Platelets: 228 K/uL (ref 150–400)
RBC: 3.13 MIL/uL — ABNORMAL LOW (ref 3.87–5.11)
RDW: 14.4 % (ref 11.5–15.5)
Smear Review: NORMAL
WBC: 2.2 K/uL — ABNORMAL LOW (ref 4.0–10.5)
nRBC: 0 % (ref 0.0–0.2)

## 2024-09-18 LAB — RESP PANEL BY RT-PCR (RSV, FLU A&B, COVID)  RVPGX2
Influenza A by PCR: NEGATIVE
Influenza B by PCR: NEGATIVE
Resp Syncytial Virus by PCR: NEGATIVE
SARS Coronavirus 2 by RT PCR: NEGATIVE

## 2024-09-18 LAB — TROPONIN T, HIGH SENSITIVITY
Troponin T High Sensitivity: <15 ng/L (ref 0–19)
Troponin T High Sensitivity: <15 ng/L (ref 0–19)

## 2024-09-18 MED ORDER — DOXYCYCLINE HYCLATE 100 MG PO CAPS
100.0000 mg | ORAL_CAPSULE | Freq: Two times a day (BID) | ORAL | 0 refills | Status: AC
  Filled 2024-09-18: qty 14, 7d supply, fill #0

## 2024-09-18 MED ORDER — SODIUM CHLORIDE 0.9 % IV BOLUS
1000.0000 mL | Freq: Once | INTRAVENOUS | Status: AC
  Administered 2024-09-18: 1000 mL via INTRAVENOUS

## 2024-09-18 NOTE — ED Provider Notes (Signed)
 [place] EMERGENCY DEPARTMENT AT [facility] Provider Note   CSN: [id number] Arrival date & time: 09/18/24  9082     Patient presents with: Dizziness and Cough   Kristina Nicholson is a 58 y.o. female.  {Add pertinent medical, surgical, social history, OB history to YEP:67052} Patient states she had some dizziness that has improved a couple days ago and has persistent cough for over a few weeks.  She has a history of metastatic breast cancer   Dizziness Cough      Prior to Admission medications   Medication Sig Start Date End Date Taking? Authorizing Provider  doxycycline (VIBRAMYCIN) 100 MG capsule Take 1 capsule (100 mg total) by mouth 2 (two) times daily. One po bid x 7 days 09/18/24  Yes Gazelle Towe, MD  gabapentin  (NEURONTIN ) 300 MG capsule Take 1 capsule (300 mg total) by mouth 3 (three) times daily. 06/17/24   Pickenpack-Cousar, Athena N, NP  HYDROcodone -acetaminophen  (NORCO/VICODIN) 5-325 MG tablet Take 1 tablet by mouth every 8 (eight) hours as needed for moderate pain (pain score 4-6) or severe pain (pain score 7-10). 06/30/24   [name], MD  letrozole  (FEMARA ) 2.5 MG tablet Take 1 tablet (2.5 mg total) by mouth daily. 06/30/24   [name], MD  loperamide (IMODIUM A-D) 2 MG tablet Take 2 mg by mouth 4 (four) times daily as needed for diarrhea or loose stools.    [provider]  Multiple Vitamins-Minerals (MULTIVITAMIN WOMEN PO) Take 1 tablet by mouth daily with breakfast.    [provider]  Nutritional Supplements (CHLORELLA-SPIRULINA COMPLEX PO) Take 1 capsule by mouth daily as needed (to aid digestive health).    [provider]  palbociclib  (IBRANCE ) 125 MG tablet Take 1 tablet (125 mg total) by mouth daily. Take for 21 days on, 7 days off, repeat every 28 days. 08/04/24   [name], MD    Allergies: Patient has no known allergies.    Review of Systems  Respiratory:  Positive for cough.   Neurological:  Positive for  dizziness.    Updated Vital Signs BP 116/67   Pulse (!) 58   Temp 98.6 F (37 C)   Resp 19   LMP 06/22/2015 Comment: 06/08/17 vaginal bleeding for 4 days  SpO2 96%   Physical Exam  (all labs ordered are listed, but only abnormal results are displayed) Labs Reviewed  CBC WITH DIFFERENTIAL/PLATELET - Abnormal; Notable for the following components:      Result Value   WBC 2.2 (*)    RBC 3.13 (*)    Hemoglobin 10.9 (*)    HCT 34.1 (*)    MCV 108.9 (*)    MCH 34.8 (*)    Neutro Abs 1.3 (*)    Lymphs Abs 0.6 (*)    All other components within normal limits  COMPREHENSIVE METABOLIC PANEL WITH GFR - Abnormal; Notable for the following components:   Glucose, Bld 131 (*)    All other components within normal limits  URINALYSIS, COMPLETE (UACMP) WITH MICROSCOPIC - Abnormal; Notable for the following components:   Hgb urine dipstick SMALL (*)    Leukocytes,Ua SMALL (*)    All other components within normal limits  RESP PANEL BY RT-PCR (RSV, FLU A&B, COVID)  [id number]  TROPONIN T, HIGH SENSITIVITY  TROPONIN T, HIGH SENSITIVITY    EKG: EKG Interpretation Date/Time:  Thursday September 18 2024 09:49:35 EST Ventricular Rate:  70 PR Interval:  196 QRS Duration:  98 QT Interval:  433 QTC  Calculation: 468 R Axis:   102  Text Interpretation: Sinus rhythm Right axis deviation Nonspecific T abnormalities, lateral leads ST elevation, consider inferior injury Confirmed by Suzette Pac [id number]) on 09/18/2024 10:03:15 AM  Radiology: [facility] Chest Port 1 View Result Date: 09/18/2024 EXAM: 1 VIEW(S) XRAY OF THE CHEST 09/18/2024 10:06:00 AM COMPARISON: PET scan 05/26/2024. CLINICAL HISTORY: sob FINDINGS: LUNGS AND PLEURA: No focal pulmonary opacity. No pulmonary edema. No pleural effusion. No pneumothorax. HEART AND MEDIASTINUM: No acute abnormality of the cardiac and mediastinal silhouettes. BONES AND SOFT TISSUES: Surgical clips in left axilla. Lesion in the left scapula has increased in size,  worrisome for an active metastasis. 2.7 cm sclerotic lesion in left humeral head, nonspecific, stable and likely benign. IMPRESSION: 1. Lesion in the left scapula has increased in size, worrisome for active metastasis. Electronically signed by: Lonni Necessary MD 09/18/2024 11:05 AM EST RP Workstation: [id number]    {Document cardiac monitor, telemetry assessment procedure when appropriate:32947} Procedures   Medications Ordered in the ED  sodium chloride  0.9 % bolus 1,000 mL (0 mLs Intravenous Stopped 09/18/24 1151)      {Click here for ABCD2, HEART and other calculators REFRESH Note before signing:1}                              Medical Decision Making Amount and/or Complexity of Data Reviewed Labs: ordered. Radiology: ordered. ECG/medicine tests: ordered.  Risk Prescription drug management.   Labs unremarkable.  Patient metastatic breast cancer and persistent cough.  She will be put on doxycycline and will follow-up with her doctor in 2 weeks  {Document critical care time when appropriate  Document review of labs and clinical decision tools ie [id number], etc  Document your independent review of radiology images and any outside records  Document your discussion with family members, caretakers and with consultants  Document social determinants of health affecting pt's care  Document your decision making why or why not admission, treatments were needed:32947:::1}   Final diagnoses:  Subacute cough    ED Discharge Orders          Ordered    doxycycline (VIBRAMYCIN) 100 MG capsule  2 times daily        09/18/24 1352

## 2024-09-18 NOTE — Discharge Instructions (Signed)
Follow up with your md as planned

## 2024-09-18 NOTE — ED Triage Notes (Signed)
 Stage 4 breast cancer with mets. Currently undergoing radiation - last tx 2 months ago. Got flu shot at the same time as a bone treatment medication. Persistent cough since then. Starting Sunday patient c/o lightheadedness/dizziness. Denies any fevers. Some runny nose.

## 2024-09-28 NOTE — Assessment & Plan Note (Signed)
 [name], stage IIB, ER: Positive, PR: Negative, HER2: Negative, Grade 3, bone metastasis in 05/2023 -She was diagnosed in 04/2017. She is s/p left breast mastectomy and adjuvant radiation. Mammaprint showed low risk. -She started antiestrogen therapy with Tamoxifen  in 10/2017. She is tolerating well, plan for 10 year. She has not had a menstrual period since 2018.  Her FSH in March 2021 showed that she is still premenopausal --Due to worsening left shoulder pain, she underwent CT and bone scan in [place] during her trip. Unfortunately scan showed multiple bone lesion and uptake in sternum, cervical spine, left scapular, left 10th rib, and left hip, concerning for bone mets. She underwent bone biopsy of left scapula and sternum, both confirmed metastatic breast cancer, ER positive (weak to moderate in left scapula biopsy, and 80% strong positive in sternal biopsy), PR weak to moderate positive, HER2 2+ in left scapular, 1+ in sternal bone biopsy -PET scan from June 21, 2023 showed multifocal hypermetabolic osseous metastatic disease. No definite pathologic fracture or epidural tumor identified.  No visceral metastasis.   -she started fulvestrant  injection and Verzenio  on August 13, 2023.  Due to poor tolerance to Verzenio , dose was reduced to 100 mg twice daily -She underwent BSO on August 24, 2023, surgical path was benign. -Due to severe right hip pain from bone metastasis, she underwent right hip replacement on September 30, 2023 -Restaging PET scan November 08, 2023 showed excellent response in bone metastasis.  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 -Due to her GI side effect, I changed Verzenio  to Kisqali 600mg  daily 3 weeks on and one week off in mid Feb 2025, and it was changed to Ibrance  in mid March due to poor tolerance. She tolerates Ibrance  much better  -PET 05/26/24 showed bone met progression in left scapular, other bone mets are  well controlled. She underwent RT to left scapula

## 2024-09-29 ENCOUNTER — Encounter: Payer: Self-pay | Admitting: Nurse Practitioner

## 2024-09-29 ENCOUNTER — Encounter: Payer: Self-pay | Admitting: Hematology

## 2024-09-29 ENCOUNTER — Other Ambulatory Visit (HOSPITAL_COMMUNITY): Payer: Self-pay

## 2024-09-29 ENCOUNTER — Inpatient Hospital Stay: Attending: Hematology

## 2024-09-29 ENCOUNTER — Inpatient Hospital Stay: Admitting: Nurse Practitioner

## 2024-09-29 ENCOUNTER — Inpatient Hospital Stay (HOSPITAL_BASED_OUTPATIENT_CLINIC_OR_DEPARTMENT_OTHER): Admitting: Hematology

## 2024-09-29 VITALS — BP 104/55 | HR 71 | Temp 97.9°F | Resp 16 | Ht 63.0 in | Wt 129.0 lb

## 2024-09-29 DIAGNOSIS — C7951 Secondary malignant neoplasm of bone: Secondary | ICD-10-CM

## 2024-09-29 DIAGNOSIS — Z170 Estrogen receptor positive status [ER+]: Secondary | ICD-10-CM

## 2024-09-29 DIAGNOSIS — R530 Neoplastic (malignant) related fatigue: Secondary | ICD-10-CM | POA: Diagnosis not present

## 2024-09-29 DIAGNOSIS — D72819 Decreased white blood cell count, unspecified: Secondary | ICD-10-CM | POA: Diagnosis not present

## 2024-09-29 DIAGNOSIS — C50212 Malignant neoplasm of upper-inner quadrant of left female breast: Secondary | ICD-10-CM

## 2024-09-29 DIAGNOSIS — G893 Neoplasm related pain (acute) (chronic): Secondary | ICD-10-CM

## 2024-09-29 DIAGNOSIS — E538 Deficiency of other specified B group vitamins: Secondary | ICD-10-CM

## 2024-09-29 DIAGNOSIS — Z515 Encounter for palliative care: Secondary | ICD-10-CM | POA: Diagnosis not present

## 2024-09-29 DIAGNOSIS — Z9221 Personal history of antineoplastic chemotherapy: Secondary | ICD-10-CM | POA: Diagnosis not present

## 2024-09-29 DIAGNOSIS — Z9012 Acquired absence of left breast and nipple: Secondary | ICD-10-CM | POA: Diagnosis not present

## 2024-09-29 DIAGNOSIS — M792 Neuralgia and neuritis, unspecified: Secondary | ICD-10-CM

## 2024-09-29 DIAGNOSIS — Z923 Personal history of irradiation: Secondary | ICD-10-CM | POA: Diagnosis not present

## 2024-09-29 LAB — CBC WITH DIFFERENTIAL/PLATELET
Abs Immature Granulocytes: 0 K/uL (ref 0.00–0.07)
Basophils Absolute: 0.1 K/uL (ref 0.0–0.1)
Basophils Relative: 2 %
Eosinophils Absolute: 0 K/uL (ref 0.0–0.5)
Eosinophils Relative: 2 %
HCT: 32.1 % — ABNORMAL LOW (ref 36.0–46.0)
Hemoglobin: 11 g/dL — ABNORMAL LOW (ref 12.0–15.0)
Immature Granulocytes: 0 %
Lymphocytes Relative: 26 %
Lymphs Abs: 0.5 K/uL — ABNORMAL LOW (ref 0.7–4.0)
MCH: 37 pg — ABNORMAL HIGH (ref 26.0–34.0)
MCHC: 34.3 g/dL (ref 30.0–36.0)
MCV: 108.1 fL — ABNORMAL HIGH (ref 80.0–100.0)
Monocytes Absolute: 0.5 K/uL (ref 0.1–1.0)
Monocytes Relative: 22 %
Neutro Abs: 1 K/uL — ABNORMAL LOW (ref 1.7–7.7)
Neutrophils Relative %: 48 %
Platelets: 198 K/uL (ref 150–400)
RBC: 2.97 MIL/uL — ABNORMAL LOW (ref 3.87–5.11)
RDW: 14.5 % (ref 11.5–15.5)
WBC: 2.1 K/uL — ABNORMAL LOW (ref 4.0–10.5)
nRBC: 0 % (ref 0.0–0.2)

## 2024-09-29 LAB — COMPREHENSIVE METABOLIC PANEL WITH GFR
ALT: 10 U/L (ref 0–44)
AST: 14 U/L — ABNORMAL LOW (ref 15–41)
Albumin: 3.9 g/dL (ref 3.5–5.0)
Alkaline Phosphatase: 48 U/L (ref 38–126)
Anion gap: 5 (ref 5–15)
BUN: 10 mg/dL (ref 6–20)
CO2: 30 mmol/L (ref 22–32)
Calcium: 8.8 mg/dL — ABNORMAL LOW (ref 8.9–10.3)
Chloride: 108 mmol/L (ref 98–111)
Creatinine, Ser: 0.69 mg/dL (ref 0.44–1.00)
GFR, Estimated: >60 mL/min (ref >60)
Glucose, Bld: 92 mg/dL (ref 70–99)
Potassium: 3.6 mmol/L (ref 3.5–5.1)
Sodium: 143 mmol/L (ref 135–145)
Total Bilirubin: 0.4 mg/dL (ref 0.0–1.2)
Total Protein: 6.4 g/dL — ABNORMAL LOW (ref 6.5–8.1)

## 2024-09-29 LAB — VITAMIN B12: Vitamin B-12: 1204 pg/mL — ABNORMAL HIGH (ref 180–914)

## 2024-09-29 MED ORDER — GABAPENTIN 300 MG PO CAPS
300.0000 mg | ORAL_CAPSULE | Freq: Three times a day (TID) | ORAL | 3 refills | Status: AC
  Filled 2024-09-29 – 2024-10-14 (×2): qty 90, 30d supply, fill #0
  Filled 2024-11-17: qty 90, 30d supply, fill #1
  Filled 2024-12-18: qty 90, 30d supply, fill #2

## 2024-09-29 MED ORDER — ALBUTEROL SULFATE HFA 108 (90 BASE) MCG/ACT IN AERS
2.0000 | INHALATION_SPRAY | Freq: Four times a day (QID) | RESPIRATORY_TRACT | 0 refills | Status: AC | PRN
  Filled 2024-09-29: qty 6.7, 25d supply, fill #0

## 2024-09-29 MED ORDER — GUAIFENESIN-CODEINE 100-10 MG/5ML PO SOLN
5.0000 mL | ORAL | 0 refills | Status: AC | PRN
  Filled 2024-09-29: qty 237, 8d supply, fill #0

## 2024-09-29 NOTE — Progress Notes (Signed)
 Palliative Medicine [facility]  Telephone:(336) [phone] [phone]) [phone]   Name: Kristina Nicholson Date: 09/29/2024 MRN: [id number]  DOB: August 19, 1966  Patient Care Team: Pcp, No as PCP - [place] [name], MD as Consulting Physician (Hematology) [name], MD as Consulting Physician (General Surgery) [name], MD as Attending Physician (Radiation Oncology) Pickenpack-Cousar, [name], NP as Nurse Practitioner (Hospice and Palliative Medicine)    INTERVAL HISTORY: Kristina Nicholson is a 58 y.o. female with  oncologic medical history including estrogen receptor positive breast cancer (04/2017) with metastatic disease to the bone. Palliative ask to see for symptom management and goals of care.   SOCIAL HISTORY:     reports that she has been smoking cigarettes. She started smoking about 39 years ago. She has a 8 pack-year smoking history. She has never used smokeless tobacco. She reports that she does not drink alcohol and does not use drugs.  ADVANCE DIRECTIVES:  None on file  CODE STATUS: Full code  PAST MEDICAL HISTORY: Past Medical History:  Diagnosis Date   Chronic left shoulder pain    Headache    History of external beam radiation therapy    07-31-2017  to  09-10-2017  LEft chest wass 50 gy in 25 fractions, Left Supraclavicular 45 Gy in 25 fractions, Left Chest wall scar boost 10 Gy in 5 fractions.   Horseshoe kidney    Malignant neoplasm of upper-inner quadrant of left breast in female, estrogen receptor positive (HCC) 04/18/2017   oncologist--- dr lanny;   dx 06/ 2018 ,  Stage IIB, ER/PR+, G3, IDC/ DCIS;  06-13-2017 s/p left mastectomy w/ node dissection ;  completed radiation 09-10-2017;   recurrence w/ mets to bone 07/ 2024 , started chemotherapy   Metastatic cancer to bone (HCC) 05/2023   Uterine fibroid     ALLERGIES:  has no known allergies.  MEDICATIONS:  Current Outpatient Medications  Medication Sig Dispense Refill    albuterol (VENTOLIN HFA) 108 (90 Base) MCG/ACT inhaler Inhale 2 puffs into the lungs every 6 (six) hours as needed for wheezing or shortness of breath. 6.7 g 0   doxycycline (VIBRAMYCIN) 100 MG capsule Take 1 capsule (100 mg total) by mouth 2 (two) times daily for 7 days. 14 capsule 0   [START ON 10/13/2024] gabapentin  (NEURONTIN ) 300 MG capsule Take 1 capsule (300 mg total) by mouth 3 (three) times daily. 90 capsule 3   guaiFENesin-codeine 100-10 MG/5ML syrup Take 5 mLs by mouth every 4 (four) hours as needed for cough. 237 mL 0   letrozole  (FEMARA ) 2.5 MG tablet Take 1 tablet (2.5 mg total) by mouth daily. 30 tablet 2   loperamide (IMODIUM A-D) 2 MG tablet Take 2 mg by mouth 4 (four) times daily as needed for diarrhea or loose stools.     Multiple Vitamins-Minerals (MULTIVITAMIN WOMEN PO) Take 1 tablet by mouth daily with breakfast.     Nutritional Supplements (CHLORELLA-SPIRULINA COMPLEX PO) Take 1 capsule by mouth daily as needed (to aid digestive health).     palbociclib  (IBRANCE ) 125 MG tablet Take 1 tablet (125 mg total) by mouth daily. Take for 21 days on, 7 days off, repeat every 28 days. 21 tablet 2   No current facility-administered medications for this visit.    VITAL SIGNS: BP (!) 104/55 (BP Location: Right Arm, Patient Position: Sitting) Comment: nurse is aware  Pulse 71   Temp 97.9 F (36.6 C) (Temporal)   Resp 16   Ht 5'  3 (1.6 m)   Wt 129 lb (58.5 kg)   LMP 06/22/2015 Comment: 06/08/17 vaginal bleeding for 4 days  SpO2 99%   BMI 22.85 kg/m  Filed Weights   09/29/24 1010  Weight: 129 lb (58.5 kg)    Estimated body mass index is 22.85 kg/m as calculated from the following:   Height as of this encounter: 5' 3 (1.6 m).   Weight as of this encounter: 129 lb (58.5 kg).   PERFORMANCE STATUS (ECOG) : 1 - Symptomatic but completely ambulatory  Physical Exam General: NAD Cardiovascular: regular rate and rhythm Pulmonary: normal breathing pattern,cough Extremities: no  edema, no joint deformities Skin: no rashes Neurological: AAO x3  IMPRESSION: Discussed the use of AI scribe software for clinical note transcription with the patient, who gave verbal consent to proceed.  History of Present Illness Kristina Nicholson is a 58 year old female who presents to clinic for symptom management follow-up. She is accompanied by her husband.  Denies concerns of nausea, vomiting, constipation, or diarrhea.  Reports her appetite is good.  Some days are better than others. Appetite is good. Weight stable at 129lbs.   She experiences shoulder pain, described as soreness, which has improved but persists. She is currently taking gabapentin  300mg  for pain management. Reports this regimen is effective. No adjustments at this time.   She reports recent episodes of excessive coughing, severe enough to require an emergency room visit. During this episode, she experienced dizziness, but no pulse abnormalities were found. Continues to have persistent cough which is nagging at times. Plans to discuss further with oncologist today.   I discussed the importance of continued conversation with family and their medical providers regarding overall plan of care and treatment options, ensuring decisions are within the context of the patients values and GOCs. Assessment & Plan Shoulder Pain Left shoulder pain improved with use of Tylenol  and gabapentin . Gabapentin  300 mg regimen effective for pain management. - Continue gabapentin  300 mg, one tablet in the morning and two at night. - Ensure gabapentin  refill availability. - Use Voltaren gel up to three times daily for additional relief. - Contact office if symptoms worsen before follow-up.  No new symptoms. I will plan to see patient back in 3-4 weeks.  Sooner if needed. No nausea, vomiting, constipation, or diarrhea. Appetite is good.  I will plan to see patient back in 6-8 weeks. Sooner if needed.   Patient expressed understanding and  was in agreement with this plan. She also understands that She can call the clinic at any time with any questions, concerns, or complaints.   Any controlled substances utilized were prescribed in the context of palliative care. PDMP has been reviewed.   Visit consisted of counseling and education dealing with the complex and emotionally intense issues of symptom management and palliative care in the setting of serious and potentially life-threatening illness.  Levon Borer, AGPCNP-BC  Palliative Medicine Team/[facility]

## 2024-09-29 NOTE — Progress Notes (Signed)
 [facility]   Telephone:(336) [phone] Fax:(336) [phone]   Clinic Follow up Note   Patient Care Team: Pcp, No as PCP - General [name], MD as Consulting Physician (Hematology) [name], MD as Consulting Physician (General Surgery) [name], MD as Attending Physician (Radiation Oncology) [name], NP as Nurse Practitioner (Hospice and Palliative Medicine)  Date of Service:  09/29/2024  CHIEF COMPLAINT: f/u of metastatic breast cancer  CURRENT THERAPY:  Letrozole  and Ibrance   Oncology History   Malignant neoplasm of upper-inner quadrant of left breast in female, estrogen receptor positive (HCC) [name], stage IIB, ER: Positive, PR: Negative, HER2: Negative, Grade 3, bone metastasis in 05/2023 -She was diagnosed in 04/2017. She is s/p left breast mastectomy and adjuvant radiation. Mammaprint showed low risk. -She started antiestrogen therapy with Tamoxifen  in 10/2017. She is tolerating well, plan for 10 year. She has not had a menstrual period since 2018.  Her FSH in March 2021 showed that she is still premenopausal --Due to worsening left shoulder pain, she underwent CT and bone scan in [place] during her trip. Unfortunately scan showed multiple bone lesion and uptake in sternum, cervical spine, left scapular, left 10th rib, and left hip, concerning for bone mets. She underwent bone biopsy of left scapula and sternum, both confirmed metastatic breast cancer, ER positive (weak to moderate in left scapula biopsy, and 80% strong positive in sternal biopsy), PR weak to moderate positive, HER2 2+ in left scapular, 1+ in sternal bone biopsy -PET scan from June 21, 2023 showed multifocal hypermetabolic osseous metastatic disease. No definite pathologic fracture or epidural tumor identified.  No visceral metastasis.   -she started fulvestrant  injection and Verzenio  on August 13, 2023.  Due to poor tolerance to Verzenio , dose was reduced to 100 mg  twice daily -She underwent BSO on August 24, 2023, surgical path was benign. -Due to severe right hip pain from bone metastasis, she underwent right hip replacement on September 30, 2023 -Restaging PET scan November 08, 2023 showed excellent response in bone metastasis.  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 -Due to her GI side effect, I changed Verzenio  to Kisqali 600mg  daily 3 weeks on and one week off in mid Feb 2025, and it was changed to Ibrance  in mid March due to poor tolerance. She tolerates Ibrance  much better  -PET 05/26/24 showed bone met progression in left scapular, other bone mets are well controlled. She underwent RT to left scapula    Assessment & Plan Metastatic breast cancer, left upper-inner quadrant Metastatic breast cancer with a scalp lesion increasing in size. Recent Zometa  treatment on October 6th. PET scan needed to assess disease progression. - Ordered PET scan before the end of the month - Scheduled follow-up phone call a few days after PET scan  Cough and rhinorrhea Persistent cough and rhinorrhea since flu vaccination. Negative respiratory panel for COVID, RSV, and influenza. Possible asthma or allergy-related chronic cough. Consideration of pulmonology referral if symptoms persist. - Prescribed cough syrup and inhaler - Instructed to hold Ibrance  until further evaluation - Advised to consult pharmacist for inhaler use - Will consider pulmonology referral if symptoms persist  Dizziness and diaphoresis, possible hypoglycemia Episodes of dizziness and diaphoresis possibly related to hypoglycemia. Blood sugar levels normal today. Symptoms improved with candy intake. - Advised to consume candy during symptomatic episodes  Leukopenia White blood cell count slightly low.  Hip pain Intermittent hip pain reported. No specific cause  identified. - Continue current management  Plan - I reviewed her ED visit  records including lab and chest x-ray last week - I called in cough syrup for her - Will obtain PET scan in the next few weeks, follow-up after PET scan. - Will temporarily hold Ibrance  for now until we have PET scan done  - Will check with lab to see if we can have test for whooping cough   SUMMARY OF ONCOLOGIC HISTORY: Oncology History Overview Note  Cancer Staging Malignant neoplasm of upper-inner quadrant of left breast in female, estrogen receptor positive (HCC) Staging form: Breast, AJCC 8th Edition - Clinical stage from 04/13/2017: Stage IIA (cT2, cN0, cM0, G2, ER: Positive, PR: Negative, HER2: Negative) - Signed by [name], MD on 04/25/2017 - Pathologic stage from 06/13/2017: Stage IIB (pT2, pN1a, cM0, G1, ER: Positive, PR: Negative, HER2: Negative) - Signed by [name], MD on 09/04/2017     Malignant neoplasm of upper-inner quadrant of left breast in female, estrogen receptor positive (HCC)  04/10/2017 Mammogram   Left breast mammo and US  showed an irregular hypoechoic shadowing mass at 9 o'clock 1 cm from the nipple measuring 2.3 x 1 x 2.4 cm. This corresponds well with the mass seen at mammography. No lymphadenopathy seen in the left axilla.   04/18/2017 Initial Diagnosis   Malignant neoplasm of upper-inner quadrant of left breast in female, estrogen receptor positive (HCC)   04/22/2017 Receptors her2   Estrogen Receptor: 95%, POSITIVE, STRONG STAINING INTENSITY Progesterone Receptor: 0%, NEGATIVE Proliferation Marker Ki67: 10% HER2 -   04/22/2017 Initial Biopsy   Breast, left, needle core biopsy, 9:30 o'clock - INVASIVE DUCTAL CARCINOMA, G2 - DUCTAL CARCINOMA IN SITU.   06/13/2017 Surgery   Left breast mastectomy with sentinel lymph node biopsy performed by Dr Curvin.    06/13/2017 Pathology Results   Breast, simple mastectomy, Left - INVASIVE DUCTAL CARCINOMA, GRADE I/III, SPANNING 2.5 CM. - DUCTAL CARCINOMA IN SITU, INTERMEDIATE GRADE. - LOBULAR NEOPLASIA (ATYPICAL LOBULAR  HYPERPLASIA). - THE SURGICAL RESECTION MARGINS ARE NEGATIVE FOR CARCINOMA.  Lymph node, sentinel, biopsy, Left axillary #1 - METASTATIC CARCINOMA IN 1 OF 3 LYMPH NODE (1/3), WITH EXTRACAPSULAR EXTENSION.   06/13/2017 Pathology Results   Mammaprint with low risk disease.    07/31/2017 - 09/10/2017 Radiation Therapy   The patient began a course of radiotherapy over the left chest wall and axillary area, supervised by Dr Lauraine Golden, MD.     Radiation treatment dates:   07/31/2017 - 09/10/2017   Site/dose:    1) Left Chest Wall / 50 Gy in 25 fractions 2) Left Supraclavicular / 45 Gy in 25 fractions 3) Left Chest Wall Scar Boost / 10 Gy in 5 fractions   Beams/energy:    1) Opposed tangents 3D/ 10 and 6 MV photons 2) Additional fields 3D / 10 MV and 6 MV photons 3) En face electrons / 6 MeV electrons   Narrative: The patient tolerated radiation treatment relatively well. She experienced mild fatigue. She reported pain, burning, and itching to the left breast and swelling under the left arm. On physical exam, she had dry peeling in the left UIQ of the breast and left axilla, as well as diffuse hyperpigmentation and erythema of the left chest wall. She is using the radiaplex cream to the treatment area and neosporin to the peeling areas.   10/13/2017 -  Anti-estrogen oral therapy   Tamoxifen  10mg  daily   04/11/2018 Mammogram   04/11/2018 Mammogram IMPRESSION: No mammographic evidence of  malignancy. A result letter of this screening mammogram will be mailed directly to the patient.   04/11/2018 Mammogram   Screening Unilateral Right: No evidence of mammographic malignancy.       Discussed the use of AI scribe software for clinical note transcription with the patient, who gave verbal consent to proceed.  History of Present Illness James Kristina Nicholson is a 59 year old female with metastatic breast cancer who presents for follow-up.  She experiences a persistent cough and difficulty  breathing, which began after a flu shot and has continued for about a month since her Zometa  treatment on October 6th. The cough is less severe outdoors. A respiratory panel is negative for COVID-19, RSV, and influenza. She also has nasal congestion and rhinorrhea, particularly in the mornings.  She has used over-the-counter cough medicine, specifically 'Niquid', but has not taken it recently.  She experienced an episode of dizziness with sweating and a slight feeling of hunger, which improved after consuming a piece of candy. Her blood sugar was normal at the time. No sore throat or additional discomfort beyond the cough and nasal symptoms.     All other systems were reviewed with the patient and are negative.  MEDICAL HISTORY:  Past Medical History:  Diagnosis Date   Chronic left shoulder pain    Headache    History of external beam radiation therapy    07-31-2017  to  09-10-2017  LEft chest wass 50 gy in 25 fractions, Left Supraclavicular 45 Gy in 25 fractions, Left Chest wall scar boost 10 Gy in 5 fractions.   Horseshoe kidney    Malignant neoplasm of upper-inner quadrant of left breast in female, estrogen receptor positive (HCC) 04/18/2017   oncologist--- dr lanny;   dx 06/ 2018 ,  Stage IIB, ER/PR+, G3, IDC/ DCIS;  06-13-2017 s/p left mastectomy w/ node dissection ;  completed radiation 09-10-2017;   recurrence w/ mets to bone 07/ 2024 , started chemotherapy   Metastatic cancer to bone (HCC) 05/2023   Uterine fibroid     SURGICAL HISTORY: Past Surgical History:  Procedure Laterality Date   APPENDECTOMY  2005   COLONOSCOPY  2016   GANGLION CYST EXCISION Right 09/24/2017   Procedure: EXCISION OF RIGHT DORSAL WRIST GANGLION;  Surgeon: Sebastian Lenis, MD;  Location: [facility];  Service: Orthopedics;  Laterality: Right;   LAPAROSCOPIC SALPINGO OOPHERECTOMY Bilateral 08/24/2023   Procedure: LAPAROSCOPIC SALPINGO OOPHORECTOMY;  Surgeon: Dannielle Bouchard, DO;  Location:  [facility];  Service: Gynecology;  Laterality: Bilateral;   LIPOMA EXCISION Left 06/13/2017   Procedure: EXCISION 3CM LIPOMA ON LEFT ARM;  Surgeon: [name], MD;  Location: [facility];  Service: General;  Laterality: Left;   MASTECTOMY W/ SENTINEL NODE BIOPSY Left 06/13/2017   Procedure: MASTECTOMY WITH SENTINEL LYMPH NODE BIOPSY;  Surgeon: [name], MD;  Location: [facility];  Service: General;  Laterality: Left;   TOTAL HIP ARTHROPLASTY Right 09/30/2023   Procedure: TOTAL HIP ARTHROPLASTY ANTERIOR APPROACH;  Surgeon: Ernie Cough, MD;  Location: [facility];  Service: Orthopedics;  Laterality: Right;    I have reviewed the social history and family history with the patient and they are unchanged from previous note.  ALLERGIES:  has no known allergies.  MEDICATIONS:  Current Outpatient Medications  Medication Sig Dispense Refill   albuterol (VENTOLIN HFA) 108 (90 Base) MCG/ACT inhaler Inhale 2 puffs into the lungs every 6 (six) hours as needed for wheezing or shortness of breath. 6.7 g 0  guaiFENesin-codeine 100-10 MG/5ML syrup Take 5 mLs by mouth every 4 (four) hours as needed for cough. 237 mL 0   doxycycline (VIBRAMYCIN) 100 MG capsule Take 1 capsule (100 mg total) by mouth 2 (two) times daily for 7 days. 14 capsule 0   gabapentin  (NEURONTIN ) 300 MG capsule Take 1 capsule (300 mg total) by mouth 3 (three) times daily. 90 capsule 3   HYDROcodone -acetaminophen  (NORCO/VICODIN) 5-325 MG tablet Take 1 tablet by mouth every 8 (eight) hours as needed for moderate pain (pain score 4-6) or severe pain (pain score 7-10). 10 tablet 0   letrozole  (FEMARA ) 2.5 MG tablet Take 1 tablet (2.5 mg total) by mouth daily. 30 tablet 2   loperamide (IMODIUM A-D) 2 MG tablet Take 2 mg by mouth 4 (four) times daily as needed for diarrhea or loose stools.     Multiple Vitamins-Minerals (MULTIVITAMIN WOMEN PO) Take 1 tablet by mouth daily with breakfast.     Nutritional Supplements  (CHLORELLA-SPIRULINA COMPLEX PO) Take 1 capsule by mouth daily as needed (to aid digestive health).     palbociclib  (IBRANCE ) 125 MG tablet Take 1 tablet (125 mg total) by mouth daily. Take for 21 days on, 7 days off, repeat every 28 days. 21 tablet 2   No current facility-administered medications for this visit.    PHYSICAL EXAMINATION: ECOG PERFORMANCE STATUS: 1 - Symptomatic but completely ambulatory  There were no vitals filed for this visit. Wt Readings from Last 3 Encounters:  09/29/24 129 lb (58.5 kg)  08/18/24 132 lb 8 oz (60.1 kg)  08/04/24 131 lb 8 oz (59.6 kg)     GENERAL:alert, no distress and comfortable SKIN: skin color, texture, turgor are normal, no rashes or significant lesions EYES: normal, Conjunctiva are pink and non-injected, sclera clear NECK: supple, thyroid  normal size, non-tender, without nodularity LYMPH:  no palpable lymphadenopathy in the cervical, axillary  LUNGS: clear to auscultation and percussion with normal breathing effort HEART: regular rate & rhythm and no murmurs and no lower extremity edema ABDOMEN:abdomen soft, non-tender and normal bowel sounds Musculoskeletal:no cyanosis of digits and no clubbing  NEURO: alert & oriented x 3 with fluent speech, no focal motor/sensory deficits  Physical Exam    LABORATORY DATA:  I have reviewed the data as listed    Latest Ref Rng & Units 09/29/2024    9:38 AM 09/18/2024    9:42 AM 08/04/2024    7:46 AM  CBC  WBC 4.0 - 10.5 K/uL 2.1  2.2  2.3   Hemoglobin 12.0 - 15.0 g/dL 88.9  89.0  89.0   Hematocrit 36.0 - 46.0 % 32.1  34.1  31.3   Platelets 150 - 400 K/uL 198  228  208         Latest Ref Rng & Units 09/29/2024    9:38 AM 09/18/2024    9:42 AM 08/04/2024    7:46 AM  CMP  Glucose 70 - 99 mg/dL 92  868  92   BUN 6 - 20 mg/dL 10  12  10    Creatinine 0.44 - 1.00 mg/dL 9.30  9.22  9.30   Sodium 135 - 145 mmol/L 143  142  142   Potassium 3.5 - 5.1 mmol/L 3.6  3.8  3.9   Chloride 98 - 111 mmol/L  108  106  108   CO2 22 - 32 mmol/L 30  25  29    Calcium 8.9 - 10.3 mg/dL 8.8  9.1  9.0   Total Protein 6.5 -  8.1 g/dL 6.4  7.1  6.9   Total Bilirubin 0.0 - 1.2 mg/dL 0.4  0.3  0.4   Alkaline Phos 38 - 126 U/L 48  65  48   AST 15 - 41 U/L 14  21  14    ALT 0 - 44 U/L 10  11  9        RADIOGRAPHIC STUDIES: I have personally reviewed the radiological images as listed and agreed with the findings in the report. No results found.    Orders Placed This Encounter  Procedures   NM PET Image Restag (PS) Skull Base To Thigh    Standing Status:   Future    Expected Date:   10/06/2024    Expiration Date:   09/29/2025    If indicated for the ordered procedure, I authorize the administration of a radiopharmaceutical per Radiology protocol:   Yes    Is the patient pregnant?:   No    Preferred imaging location?:   [facility]   All questions were answered. The patient knows to call the clinic with any problems, questions or concerns. No barriers to learning was detected. The total time spent in the appointment was 30 minutes, including review of chart and various tests results, discussions about plan of care and coordination of care plan     Onita Mattock, MD 09/29/2024

## 2024-09-30 LAB — CANCER ANTIGEN 27.29: CA 27.29: 45.1 U/mL — ABNORMAL HIGH (ref 0.0–38.6)

## 2024-10-01 ENCOUNTER — Telehealth: Payer: Self-pay | Admitting: Hematology

## 2024-10-01 NOTE — Telephone Encounter (Signed)
 LVM regarding the reschedules.

## 2024-10-02 ENCOUNTER — Other Ambulatory Visit (HOSPITAL_COMMUNITY): Payer: Self-pay

## 2024-10-14 ENCOUNTER — Other Ambulatory Visit: Payer: Self-pay

## 2024-10-14 ENCOUNTER — Other Ambulatory Visit: Payer: Self-pay | Admitting: Hematology

## 2024-10-14 ENCOUNTER — Other Ambulatory Visit (HOSPITAL_COMMUNITY): Payer: Self-pay

## 2024-10-14 MED ORDER — LETROZOLE 2.5 MG PO TABS
2.5000 mg | ORAL_TABLET | Freq: Every day | ORAL | 2 refills | Status: DC
  Filled 2024-10-14: qty 30, 30d supply, fill #0

## 2024-10-20 ENCOUNTER — Encounter (HOSPITAL_COMMUNITY)
Admission: RE | Admit: 2024-10-20 | Discharge: 2024-10-20 | Disposition: A | Source: Ambulatory Visit | Attending: Hematology

## 2024-10-20 ENCOUNTER — Inpatient Hospital Stay

## 2024-10-20 ENCOUNTER — Inpatient Hospital Stay: Admitting: Hematology

## 2024-10-20 DIAGNOSIS — C50212 Malignant neoplasm of upper-inner quadrant of left female breast: Secondary | ICD-10-CM

## 2024-10-20 DIAGNOSIS — R918 Other nonspecific abnormal finding of lung field: Secondary | ICD-10-CM | POA: Diagnosis not present

## 2024-10-20 DIAGNOSIS — Z170 Estrogen receptor positive status [ER+]: Secondary | ICD-10-CM | POA: Diagnosis not present

## 2024-10-20 LAB — GLUCOSE, CAPILLARY: Glucose-Capillary: 109 mg/dL — ABNORMAL HIGH (ref 70–99)

## 2024-10-20 MED ORDER — FLUDEOXYGLUCOSE F - 18 (FDG) INJECTION
6.4200 | Freq: Once | INTRAVENOUS | Status: AC | PRN
  Administered 2024-10-20: 6.42 via INTRAVENOUS

## 2024-10-21 ENCOUNTER — Other Ambulatory Visit: Payer: Self-pay

## 2024-10-23 ENCOUNTER — Inpatient Hospital Stay

## 2024-10-23 ENCOUNTER — Other Ambulatory Visit: Payer: Self-pay

## 2024-10-23 ENCOUNTER — Inpatient Hospital Stay: Admitting: Hematology

## 2024-10-23 ENCOUNTER — Inpatient Hospital Stay: Attending: Hematology

## 2024-10-23 ENCOUNTER — Other Ambulatory Visit (HOSPITAL_COMMUNITY): Payer: Self-pay

## 2024-10-23 VITALS — BP 106/64 | HR 78 | Temp 97.2°F | Resp 17 | Wt 127.0 lb

## 2024-10-23 DIAGNOSIS — Z9221 Personal history of antineoplastic chemotherapy: Secondary | ICD-10-CM | POA: Insufficient documentation

## 2024-10-23 DIAGNOSIS — R053 Chronic cough: Secondary | ICD-10-CM | POA: Insufficient documentation

## 2024-10-23 DIAGNOSIS — C50212 Malignant neoplasm of upper-inner quadrant of left female breast: Secondary | ICD-10-CM

## 2024-10-23 DIAGNOSIS — Z923 Personal history of irradiation: Secondary | ICD-10-CM | POA: Diagnosis not present

## 2024-10-23 DIAGNOSIS — Z170 Estrogen receptor positive status [ER+]: Secondary | ICD-10-CM | POA: Diagnosis not present

## 2024-10-23 DIAGNOSIS — Z9012 Acquired absence of left breast and nipple: Secondary | ICD-10-CM | POA: Insufficient documentation

## 2024-10-23 DIAGNOSIS — C7951 Secondary malignant neoplasm of bone: Secondary | ICD-10-CM | POA: Diagnosis present

## 2024-10-23 DIAGNOSIS — R052 Subacute cough: Secondary | ICD-10-CM | POA: Diagnosis not present

## 2024-10-23 LAB — CBC WITH DIFFERENTIAL/PLATELET
Abs Immature Granulocytes: 0.01 K/uL (ref 0.00–0.07)
Basophils Absolute: 0 K/uL (ref 0.0–0.1)
Basophils Relative: 1 %
Eosinophils Absolute: 0.6 K/uL — ABNORMAL HIGH (ref 0.0–0.5)
Eosinophils Relative: 13 %
HCT: 36.9 % (ref 36.0–46.0)
Hemoglobin: 12.3 g/dL (ref 12.0–15.0)
Immature Granulocytes: 0 %
Lymphocytes Relative: 20 %
Lymphs Abs: 1 K/uL (ref 0.7–4.0)
MCH: 34.4 pg — ABNORMAL HIGH (ref 26.0–34.0)
MCHC: 33.3 g/dL (ref 30.0–36.0)
MCV: 103.1 fL — ABNORMAL HIGH (ref 80.0–100.0)
Monocytes Absolute: 0.5 K/uL (ref 0.1–1.0)
Monocytes Relative: 11 %
Neutro Abs: 2.6 K/uL (ref 1.7–7.7)
Neutrophils Relative %: 55 %
Platelets: 371 K/uL (ref 150–400)
RBC: 3.58 MIL/uL — ABNORMAL LOW (ref 3.87–5.11)
RDW: 13.2 % (ref 11.5–15.5)
WBC: 4.7 K/uL (ref 4.0–10.5)
nRBC: 0 % (ref 0.0–0.2)

## 2024-10-23 LAB — RESPIRATORY PANEL BY PCR

## 2024-10-23 LAB — COMPREHENSIVE METABOLIC PANEL WITH GFR
ALT: 11 U/L (ref 0–44)
AST: 20 U/L (ref 15–41)
Albumin: 4.3 g/dL (ref 3.5–5.0)
Alkaline Phosphatase: 73 U/L (ref 38–126)
Anion gap: 10 (ref 5–15)
BUN: 8 mg/dL (ref 6–20)
CO2: 27 mmol/L (ref 22–32)
Calcium: 9.7 mg/dL (ref 8.9–10.3)
Chloride: 104 mmol/L (ref 98–111)
Creatinine, Ser: 0.69 mg/dL (ref 0.44–1.00)
GFR, Estimated: >60 mL/min (ref >60)
Glucose, Bld: 129 mg/dL — ABNORMAL HIGH (ref 70–99)
Potassium: 4.1 mmol/L (ref 3.5–5.1)
Sodium: 142 mmol/L (ref 135–145)
Total Bilirubin: 0.4 mg/dL (ref 0.0–1.2)
Total Protein: 7.6 g/dL (ref 6.5–8.1)

## 2024-10-23 MED ORDER — AMOXICILLIN-POT CLAVULANATE 875-125 MG PO TABS
1.0000 | ORAL_TABLET | Freq: Two times a day (BID) | ORAL | 0 refills | Status: AC
  Filled 2024-10-23: qty 14, 7d supply, fill #0

## 2024-10-23 MED ORDER — AZITHROMYCIN 500 MG PO TABS
500.0000 mg | ORAL_TABLET | Freq: Every day | ORAL | 0 refills | Status: AC
  Filled 2024-10-23: qty 5, 5d supply, fill #0

## 2024-10-23 NOTE — Progress Notes (Signed)
 [facility]   [phone] [phone]   Clinic Follow up Note   Patient Care Team: Pcp, No as PCP - General [name], MD as Consulting Physician (Hematology) [name], MD as Consulting Physician (General Surgery) [name], MD as Attending Physician (Radiation Oncology) Pickenpack-Cousar, [name], NP as Nurse Practitioner (Hospice and Palliative Medicine)  Date of Service:  10/23/2024  CHIEF COMPLAINT: f/u of left breast cancer  CURRENT THERAPY:  Letrozole , plan to change  Oncology History   Malignant neoplasm of upper-inner quadrant of left breast in female, estrogen receptor positive (HCC) [name], stage IIB, ER: Positive, PR: Negative, HER2: Negative, Grade 3, bone metastasis in 05/2023 -She was diagnosed in 04/2017. She is s/p left breast mastectomy and adjuvant radiation. Mammaprint showed low risk. -She started antiestrogen therapy with Tamoxifen  in 10/2017. She is tolerating well, plan for 10 year. She has not had a menstrual period since 2018.  Her FSH in March 2021 showed that she is still premenopausal --Due to worsening left shoulder pain, she underwent CT and bone scan in [place] during her trip. Unfortunately scan showed multiple bone lesion and uptake in sternum, cervical spine, left scapular, left 10th rib, and left hip, concerning for bone mets. She underwent bone biopsy of left scapula and sternum, both confirmed metastatic breast cancer, ER positive (weak to moderate in left scapula biopsy, and 80% strong positive in sternal biopsy), PR weak to moderate positive, HER2 2+ in left scapular, 1+ in sternal bone biopsy -PET scan from June 21, 2023 showed multifocal hypermetabolic osseous metastatic disease. No definite pathologic fracture or epidural tumor identified.  No visceral metastasis.   -she started fulvestrant  injection and Verzenio  on August 13, 2023.  Due to poor tolerance to Verzenio , dose was reduced to 100 mg twice  daily -She underwent BSO on August 24, 2023, surgical path was benign. -Due to severe right hip pain from bone metastasis, she underwent right hip replacement on September 30, 2023 -Restaging PET scan November 08, 2023 showed excellent response in bone metastasis.  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 -Due to her GI side effect, I changed Verzenio  to Kisqali 600mg  daily 3 weeks on and one week off in mid Feb 2025, and it was changed to Ibrance  in mid March due to poor tolerance. She tolerates Ibrance  much better  -PET 05/26/24 showed bone met progression in left scapular, other bone mets are well controlled. She underwent RT to left scapula   Assessment & Plan Metastatic estrogen receptor positive breast cancer with bone metastases Disease progression is evident on recent PET scan with new osseous metastases at L3 and right sacral ala, while prior sites have improved post-radiation. There is concern for endocrine-resistant disease, necessitating molecular characterization to guide further therapy. She discontinued Ibrance  due to immunosuppression and neutropenia and is currently off all cancer-directed therapy. Future management will depend on molecular testing, with options including targeted agents for ESR1 mutation, fulvestrant , and PIK3 inhibitors, all non-cytotoxic regimens. - Ordered Guardian 360 blood test to assess for actionable mutations (ESR1, PIK3CA) to guide targeted therapy. - Held Ibrance  due to prior immunosuppression and ongoing infection evaluation; advised not to refill until further notice. - Scheduled phone visit to discuss Guardian 360 results and next steps. - Discussed possible future treatment options including FDA-approved agents for ESR1 mutation, fulvestrant , and PIK3 inhibitors, contingent on mutation status.  Suspected pulmonary infection vs metastasis  She has persistent cough and imaging  demonstrates  significant pulmonary inflammation. Differential includes atypical bacterial infection such as Bordetella pertussis and other rare pathogens, particularly in the context of prior immunosuppression from Ibrance . Previous antibiotics were ineffective. She reports no fever or hypoxia. - Performed pertussis PCR swab. - Prescribed two antibiotics: one targeting Bordetella pertussis and one for other bacterial pathogens. - Referred to pulmonology or infectious disease for evaluation of persistent cough and possible rare infections. - Advised bronchoscopy if cough persists after antibiotics to obtain cultures, lavage, and possible biopsy for definitive diagnosis. - Instructed her to pick up antibiotics at [facility] pharmacy and initiate treatment today.  History of drug-induced neutropenia She previously experienced neutropenia, likely secondary to Ibrance , which has resolved following discontinuation. Current blood counts are within normal limits. - Continued to hold Ibrance  due to prior neutropenia and ongoing infection workup.  Plan - I personally reviewed her recent restaging PET scan images, which showed improved left scapular bone lesion likely due to radiation, worsening L3 and right sacral ala bone mets, so I recommend changing treatment -Guardant 360, to determine her next line therapy -PET showed new alveolar consolidation in the left lung, infection versus radiation change, less likely metastatic cancer. Will review with rad/onc Dr. Izell  - I repeated respiratory panel with nasal swab today, and called in antibiotics azithromycin and Augmentin.  If no improvement, I plan to give her prednisone on next visit - Phone visit in 7 to 10 days when Guardant360 result is back   SUMMARY OF ONCOLOGIC HISTORY: Oncology History Overview Note  Cancer Staging Malignant neoplasm of upper-inner quadrant of left breast in female, estrogen receptor positive (HCC) Staging form: Breast, AJCC 8th Edition -  Clinical stage from 04/13/2017: Stage IIA (cT2, cN0, cM0, G2, ER: Positive, PR: Negative, HER2: Negative) - Signed by [name], MD on 04/25/2017 - Pathologic stage from 06/13/2017: Stage IIB (pT2, pN1a, cM0, G1, ER: Positive, PR: Negative, HER2: Negative) - Signed by [name], MD on 09/04/2017     Malignant neoplasm of upper-inner quadrant of left breast in female, estrogen receptor positive (HCC)  04/10/2017 Mammogram   Left breast mammo and US  showed an irregular hypoechoic shadowing mass at 9 o'clock 1 cm from the nipple measuring 2.3 x 1 x 2.4 cm. This corresponds well with the mass seen at mammography. No lymphadenopathy seen in the left axilla.   04/18/2017 Initial Diagnosis   Malignant neoplasm of upper-inner quadrant of left breast in female, estrogen receptor positive (HCC)   04/22/2017 Receptors her2   Estrogen Receptor: 95%, POSITIVE, STRONG STAINING INTENSITY Progesterone Receptor: 0%, NEGATIVE Proliferation Marker Ki67: 10% HER2 -   04/22/2017 Initial Biopsy   Breast, left, needle core biopsy, 9:30 o'clock - INVASIVE DUCTAL CARCINOMA, G2 - DUCTAL CARCINOMA IN SITU.   06/13/2017 Surgery   Left breast mastectomy with sentinel lymph node biopsy performed by Dr Curvin.    06/13/2017 Pathology Results   Breast, simple mastectomy, Left - INVASIVE DUCTAL CARCINOMA, GRADE I/III, SPANNING 2.5 CM. - DUCTAL CARCINOMA IN SITU, INTERMEDIATE GRADE. - LOBULAR NEOPLASIA (ATYPICAL LOBULAR HYPERPLASIA). - THE SURGICAL RESECTION MARGINS ARE NEGATIVE FOR CARCINOMA.  Lymph node, sentinel, biopsy, Left axillary #1 - METASTATIC CARCINOMA IN 1 OF 3 LYMPH NODE (1/3), WITH EXTRACAPSULAR EXTENSION.   06/13/2017 Pathology Results   Mammaprint with low risk disease.    07/31/2017 - 09/10/2017 Radiation Therapy   The patient began a course of radiotherapy over the left chest wall and axillary area, supervised by Dr Lauraine Izell, MD.     Radiation  treatment dates:   07/31/2017 - 09/10/2017   Site/dose:     1) Left Chest Wall / 50 Gy in 25 fractions 2) Left Supraclavicular / 45 Gy in 25 fractions 3) Left Chest Wall Scar Boost / 10 Gy in 5 fractions   Beams/energy:    1) Opposed tangents 3D/ 10 and 6 MV photons 2) Additional fields 3D / 10 MV and 6 MV photons 3) En face electrons / 6 MeV electrons   Narrative: The patient tolerated radiation treatment relatively well. She experienced mild fatigue. She reported pain, burning, and itching to the left breast and swelling under the left arm. On physical exam, she had dry peeling in the left UIQ of the breast and left axilla, as well as diffuse hyperpigmentation and erythema of the left chest wall. She is using the radiaplex cream to the treatment area and neosporin to the peeling areas.   10/13/2017 -  Anti-estrogen oral therapy   Tamoxifen  10mg  daily   04/11/2018 Mammogram   04/11/2018 Mammogram IMPRESSION: No mammographic evidence of malignancy. A result letter of this screening mammogram will be mailed directly to the patient.   04/11/2018 Mammogram   Screening Unilateral Right: No evidence of mammographic malignancy.       Discussed the use of AI scribe software for clinical note transcription with the patient, who gave verbal consent to proceed.  History of Present Illness Kristina Nicholson is a 58 year old female with metastatic estrogen receptor positive breast cancer who presents for follow-up evaluation of disease status and persistent cough.  She has metastatic breast cancer with bone metastases, with new lesions at the L3 vertebra and right sacral ala on recent PET imaging, while previously irradiated sites show improvement. She stopped Ibrance  with normalization of her white blood cell count and resolution of neutropenia and is not currently on Ibrance .  She has a persistent cough that is slightly improved but still bothersome. She has no fever, normal oxygen saturation, and did not respond to prior antibiotics.     All  other systems were reviewed with the patient and are negative.  MEDICAL HISTORY:  Past Medical History:  Diagnosis Date   Chronic left shoulder pain    Headache    History of external beam radiation therapy    07-31-2017  to  09-10-2017  LEft chest wass 50 gy in 25 fractions, Left Supraclavicular 45 Gy in 25 fractions, Left Chest wall scar boost 10 Gy in 5 fractions.   Horseshoe kidney    Malignant neoplasm of upper-inner quadrant of left breast in female, estrogen receptor positive (HCC) 04/18/2017   oncologist--- dr lanny;   dx 06/ 2018 ,  Stage IIB, ER/PR+, G3, IDC/ DCIS;  06-13-2017 s/p left mastectomy w/ node dissection ;  completed radiation 09-10-2017;   recurrence w/ mets to bone 07/ 2024 , started chemotherapy   Metastatic cancer to bone (HCC) 05/2023   Uterine fibroid     SURGICAL HISTORY: Past Surgical History:  Procedure Laterality Date   APPENDECTOMY  2005   COLONOSCOPY  2016   GANGLION CYST EXCISION Right 09/24/2017   Procedure: EXCISION OF RIGHT DORSAL WRIST GANGLION;  Surgeon: Sebastian Lenis, MD;  Location: [facility];  Service: Orthopedics;  Laterality: Right;   LAPAROSCOPIC SALPINGO OOPHERECTOMY Bilateral 08/24/2023   Procedure: LAPAROSCOPIC SALPINGO OOPHORECTOMY;  Surgeon: Dannielle Bouchard, DO;  Location: [facility];  Service: Gynecology;  Laterality: Bilateral;   LIPOMA EXCISION Left 06/13/2017   Procedure: EXCISION 3CM LIPOMA ON LEFT ARM;  Surgeon: [name], MD;  Location: [facility];  Service: General;  Laterality: Left;   MASTECTOMY W/ SENTINEL NODE BIOPSY Left 06/13/2017   Procedure: MASTECTOMY WITH SENTINEL LYMPH NODE BIOPSY;  Surgeon: [name], MD;  Location: [facility];  Service: General;  Laterality: Left;   TOTAL HIP ARTHROPLASTY Right 09/30/2023   Procedure: TOTAL HIP ARTHROPLASTY ANTERIOR APPROACH;  Surgeon: Ernie Cough, MD;  Location: [facility];  Service: Orthopedics;  Laterality: Right;    I have reviewed the social  history and family history with the patient and they are unchanged from previous note.  ALLERGIES:  has no known allergies.  MEDICATIONS:  Current Outpatient Medications  Medication Sig Dispense Refill   albuterol  (VENTOLIN  HFA) 108 (90 Base) MCG/ACT inhaler Inhale 2 puffs into the lungs every 6 (six) hours as needed for wheezing or shortness of breath. 6.7 g 0   amoxicillin-clavulanate (AUGMENTIN) 875-125 MG tablet Take 1 tablet by mouth 2 (two) times daily for 7 days. 14 tablet 0   azithromycin (ZITHROMAX) 500 MG tablet Take 1 tablet (500 mg total) by mouth daily for 5 days. 5 tablet 0   doxycycline  (VIBRAMYCIN ) 100 MG capsule Take 1 capsule (100 mg total) by mouth 2 (two) times daily for 7 days. 14 capsule 0   gabapentin  (NEURONTIN ) 300 MG capsule Take 1 capsule (300 mg total) by mouth 3 (three) times daily. 90 capsule 3   guaiFENesin -codeine  100-10 MG/5ML syrup Take 5 mLs by mouth every 4 (four) hours as needed for cough. 237 mL 0   letrozole  (FEMARA ) 2.5 MG tablet Take 1 tablet (2.5 mg total) by mouth daily. 30 tablet 2   Multiple Vitamins-Minerals (MULTIVITAMIN WOMEN PO) Take 1 tablet by mouth daily with breakfast.     Nutritional Supplements (CHLORELLA-SPIRULINA COMPLEX PO) Take 1 capsule by mouth daily as needed (to aid digestive health).     palbociclib  (IBRANCE ) 125 MG tablet Take 1 tablet (125 mg total) by mouth daily. Take for 21 days on, 7 days off, repeat every 28 days. 21 tablet 2   loperamide (IMODIUM A-D) 2 MG tablet Take 2 mg by mouth 4 (four) times daily as needed for diarrhea or loose stools.     No current facility-administered medications for this visit.    PHYSICAL EXAMINATION: ECOG PERFORMANCE STATUS: 2 - Symptomatic, <50% confined to bed  Vitals:   10/23/24 1144  BP: 106/64  Pulse: 78  Resp: 17  Temp: (!) 97.2 F (36.2 C)  SpO2: 98%   Wt Readings from Last 3 Encounters:  10/23/24 127 lb (57.6 kg)  09/29/24 129 lb (58.5 kg)  08/18/24 132 lb 8 oz (60.1 kg)      GENERAL:alert, no distress and comfortable SKIN: skin color, texture, turgor are normal, no rashes or significant lesions EYES: normal, Conjunctiva are pink and non-injected, sclera clear NECK: supple, thyroid  normal size, non-tender, without nodularity LYMPH:  no palpable lymphadenopathy in the cervical, axillary  LUNGS: clear to auscultation and percussion with normal breathing effort HEART: regular rate & rhythm and no murmurs and no lower extremity edema ABDOMEN:abdomen soft, non-tender and normal bowel sounds Musculoskeletal:no cyanosis of digits and no clubbing  NEURO: alert & oriented x 3 with fluent speech, no focal motor/sensory deficits  Physical Exam    LABORATORY DATA:  I have reviewed the data as listed    Latest Ref Rng & Units 10/23/2024   11:02 AM 09/29/2024    9:38 AM 09/18/2024    9:42 AM  CBC  WBC  4.0 - 10.5 K/uL 4.7  2.1  2.2   Hemoglobin 12.0 - 15.0 g/dL 87.6  88.9  89.0   Hematocrit 36.0 - 46.0 % 36.9  32.1  34.1   Platelets 150 - 400 K/uL 371  198  228         Latest Ref Rng & Units 10/23/2024   11:02 AM 09/29/2024    9:38 AM 09/18/2024    9:42 AM  CMP  Glucose 70 - 99 mg/dL 870  92  868   BUN 6 - 20 mg/dL 8  10  12    Creatinine 0.44 - 1.00 mg/dL 9.30  9.30  9.22   Sodium 135 - 145 mmol/L 142  143  142   Potassium 3.5 - 5.1 mmol/L 4.1  3.6  3.8   Chloride 98 - 111 mmol/L 104  108  106   CO2 22 - 32 mmol/L 27  30  25    Calcium 8.9 - 10.3 mg/dL 9.7  8.8  9.1   Total Protein 6.5 - 8.1 g/dL 7.6  6.4  7.1   Total Bilirubin 0.0 - 1.2 mg/dL 0.4  0.4  0.3   Alkaline Phos 38 - 126 U/L 73  48  65   AST 15 - 41 U/L 20  14  21    ALT 0 - 44 U/L 11  10  11        RADIOGRAPHIC STUDIES: I have personally reviewed the radiological images as listed and agreed with the findings in the report. No results found.    Orders Placed This Encounter  Procedures   Respiratory (~20 pathogens) panel by PCR    Standing Status:   Standing    Number of  Occurrences:   1    Patient immune status:   Immunocompromised    Release to patient:   Immediate [1]   Droplet precaution    Standing Status:   Standing    Number of Occurrences:   1   All questions were answered. The patient knows to call the clinic with any problems, questions or concerns. No barriers to learning was detected. The total time spent in the appointment was 40 minutes, including review of chart and various tests results, discussions about plan of care and coordination of care plan     Onita Mattock, MD 10/23/2024

## 2024-10-23 NOTE — Assessment & Plan Note (Signed)
 [name], stage IIB, ER: Positive, PR: Negative, HER2: Negative, Grade 3, bone metastasis in 05/2023 -She was diagnosed in 04/2017. She is s/p left breast mastectomy and adjuvant radiation. Mammaprint showed low risk. -She started antiestrogen therapy with Tamoxifen  in 10/2017. She is tolerating well, plan for 10 year. She has not had a menstrual period since 2018.  Her FSH in March 2021 showed that she is still premenopausal --Due to worsening left shoulder pain, she underwent CT and bone scan in [place] during her trip. Unfortunately scan showed multiple bone lesion and uptake in sternum, cervical spine, left scapular, left 10th rib, and left hip, concerning for bone mets. She underwent bone biopsy of left scapula and sternum, both confirmed metastatic breast cancer, ER positive (weak to moderate in left scapula biopsy, and 80% strong positive in sternal biopsy), PR weak to moderate positive, HER2 2+ in left scapular, 1+ in sternal bone biopsy -PET scan from June 21, 2023 showed multifocal hypermetabolic osseous metastatic disease. No definite pathologic fracture or epidural tumor identified.  No visceral metastasis.   -she started fulvestrant  injection and Verzenio  on August 13, 2023.  Due to poor tolerance to Verzenio , dose was reduced to 100 mg twice daily -She underwent BSO on August 24, 2023, surgical path was benign. -Due to severe right hip pain from bone metastasis, she underwent right hip replacement on September 30, 2023 -Restaging PET scan November 08, 2023 showed excellent response in bone metastasis.  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 -Due to her GI side effect, I changed Verzenio  to Kisqali 600mg  daily 3 weeks on and one week off in mid Feb 2025, and it was changed to Ibrance  in mid March due to poor tolerance. She tolerates Ibrance  much better  -PET 05/26/24 showed bone met progression in left scapular, other bone mets are  well controlled. She underwent RT to left scapula

## 2024-10-28 ENCOUNTER — Other Ambulatory Visit: Payer: Self-pay

## 2024-10-31 ENCOUNTER — Inpatient Hospital Stay: Admitting: Hematology

## 2024-10-31 ENCOUNTER — Other Ambulatory Visit (HOSPITAL_COMMUNITY): Payer: Self-pay

## 2024-10-31 ENCOUNTER — Other Ambulatory Visit: Payer: Self-pay

## 2024-10-31 DIAGNOSIS — Z170 Estrogen receptor positive status [ER+]: Secondary | ICD-10-CM | POA: Diagnosis not present

## 2024-10-31 DIAGNOSIS — C50212 Malignant neoplasm of upper-inner quadrant of left female breast: Secondary | ICD-10-CM | POA: Diagnosis not present

## 2024-10-31 MED ORDER — INAVOLISIB 9 MG PO TABS
9.0000 mg | ORAL_TABLET | Freq: Every day | ORAL | 1 refills | Status: AC
  Filled 2024-11-04: qty 28, 28d supply, fill #0
  Filled 2024-12-15 (×2): qty 28, 28d supply, fill #1

## 2024-10-31 MED ORDER — PREDNISONE 10 MG PO TABS
ORAL_TABLET | ORAL | 0 refills | Status: AC
  Filled 2024-10-31: qty 70, 30d supply, fill #0

## 2024-10-31 NOTE — Assessment & Plan Note (Signed)
 [name], stage IIB, ER: Positive, PR: Negative, HER2: Negative, Grade 3, bone metastasis in 05/2023 -She was diagnosed in 04/2017. She is s/p left breast mastectomy and adjuvant radiation. Mammaprint showed low risk. -She started antiestrogen therapy with Tamoxifen  in 10/2017. She is tolerating well, plan for 10 year. She has not had a menstrual period since 2018.  Her FSH in March 2021 showed that she is still premenopausal --Due to worsening left shoulder pain, she underwent CT and bone scan in [place] during her trip. Unfortunately scan showed multiple bone lesion and uptake in sternum, cervical spine, left scapular, left 10th rib, and left hip, concerning for bone mets. She underwent bone biopsy of left scapula and sternum, both confirmed metastatic breast cancer, ER positive (weak to moderate in left scapula biopsy, and 80% strong positive in sternal biopsy), PR weak to moderate positive, HER2 2+ in left scapular, 1+ in sternal bone biopsy -PET scan from June 21, 2023 showed multifocal hypermetabolic osseous metastatic disease. No definite pathologic fracture or epidural tumor identified.  No visceral metastasis.   -she started fulvestrant  injection and Verzenio  on August 13, 2023.  Due to poor tolerance to Verzenio , dose was reduced to 100 mg twice daily -She underwent BSO on August 24, 2023, surgical path was benign. -Due to severe right hip pain from bone metastasis, she underwent right hip replacement on September 30, 2023 -Restaging PET scan November 08, 2023 showed excellent response in bone metastasis.  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 -Due to her GI side effect, I changed Verzenio  to Kisqali 600mg  daily 3 weeks on and one week off in mid Feb 2025, and it was changed to Ibrance  in mid March due to poor tolerance. She tolerates Ibrance  much better  -PET 05/26/24 showed bone met progression in left scapular, other bone mets are  well controlled. She underwent RT to left scapula  -PET 10/20/2024 unfortunately showed disease progression in her bones.  Guardant360 showed PIK3CA mutation positive, plan to change her treatment to inavolisib, palbociclib  and fulvestrant  injection

## 2024-10-31 NOTE — Progress Notes (Signed)
 " [facility]   [phone] Fax:([phone]   Clinic Follow up Note   Patient Care Team: Pcp, No as PCP - General [name], MD as Consulting Physician (Hematology) [name], MD as Consulting Physician (General Surgery) [name], MD as Attending Physician (Radiation Oncology) [name], NP as Nurse Practitioner (Hospice and Palliative Medicine) 10/31/2024  I connected with Kristina Nicholson on 10/31/2024 at  3:40 PM EST by telephone and verified that I am speaking with the correct person using two identifiers.   I discussed the limitations, risks, security and privacy concerns of performing an evaluation and management service by telephone and the availability of in person appointments. I also discussed with the patient that there may be a patient responsible charge related to this service. The patient expressed understanding and agreed to proceed.   Patient's location:  Outside  Provider's location:  Office    CHIEF COMPLAINT: review test results    CURRENT THERAPY: letrozole    Oncology history Malignant neoplasm of upper-inner quadrant of left breast in female, estrogen receptor positive (HCC) [name], stage IIB, ER: Positive, PR: Negative, HER2: Negative, Grade 3, bone metastasis in 05/2023 -She was diagnosed in 04/2017. She is s/p left breast mastectomy and adjuvant radiation. Mammaprint showed low risk. -She started antiestrogen therapy with Tamoxifen  in 10/2017. She is tolerating well, plan for 10 year. She has not had a menstrual period since 2018.  Her FSH in March 2021 showed that she is still premenopausal --Due to worsening left shoulder pain, she underwent CT and bone scan in [place] during her trip. Unfortunately scan showed multiple bone lesion and uptake in sternum, cervical spine, left scapular, left 10th rib, and left hip, concerning for bone mets. She underwent bone biopsy of left scapula and sternum, both  confirmed metastatic breast cancer, ER positive (weak to moderate in left scapula biopsy, and 80% strong positive in sternal biopsy), PR weak to moderate positive, HER2 2+ in left scapular, 1+ in sternal bone biopsy -PET scan from June 21, 2023 showed multifocal hypermetabolic osseous metastatic disease. No definite pathologic fracture or epidural tumor identified.  No visceral metastasis.   -she started fulvestrant  injection and Verzenio  on August 13, 2023.  Due to poor tolerance to Verzenio , dose was reduced to 100 mg twice daily -She underwent BSO on August 24, 2023, surgical path was benign. -Due to severe right hip pain from bone metastasis, she underwent right hip replacement on September 30, 2023 -Restaging PET scan November 08, 2023 showed excellent response in bone metastasis.  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 -Due to her GI side effect, I changed Verzenio  to Kisqali 600mg  daily 3 weeks on and one week off in mid Feb 2025, and it was changed to Ibrance  in mid March due to poor tolerance. She tolerates Ibrance  much better  -PET 05/26/24 showed bone met progression in left scapular, other bone mets are well controlled. She underwent RT to left scapula  -PET 10/20/2024 unfortunately showed disease progression in her bones.  Guardant360 showed PIK3CA mutation positive, plan to change her treatment to inavolisib, palbociclib  and fulvestrant  injection   Assessment & Plan Metastatic breast cancer with PIK3CA mutation Metastatic breast cancer with a PIK3CA mutation, previously treated with letrozole , Ibrance , and fulvestrant . The addition of inavolisib, a targeted oral agent, to Ibrance  and fulvestrant  is expected to provide optimal disease control, with median PFS 15 months.  I reviewed the potential side  effect, especially hypoglycemia, stomatitis, diarrhea, fatigue, nausea, etc.  She agrees to try.   -She does not have diabetes and  has sufficient Ibrance  supply. Letrozole  was discontinued to avoid overlapping endocrine therapy. Anticipated Volaceb side effects include hyperglycemia and gastrointestinal symptoms; most patients tolerate therapy, though some require dose adjustment or discontinuation. - Ordered inavolisib via specialty pharmacy. - Instructed her to discontinue letrozole  prior to inavolisib initiation. - Scheduled fulvestrant  injections with loading dose (every two weeks for first three doses, then monthly). - Instructed her to start Volaceb and Ibrance  concurrently after Christmas. - Arranged for oral pharmacist to coordinate medication delivery and insurance coverage. - Scheduled follow-up after therapy initiation to assess tolerance and response. - Scheduled next injection and follow-up for the week of December 29th. - Documented plan and results in MyChart for her reference.  Atypical pneumonia versus radiation pneumonitis, left lung Persistent cough and left-sided chest pain for over two months with left lung consolidation on imaging. Differential includes pneumonia and radiation pneumonitis (history of left scapular radiation). Negative viral panel and clinical improvement with antibiotics favor infectious etiology, though radiation pneumonitis remains possible. Symptoms have largely resolved, with mild residual cough and pain. Prednisone considered for persistent symptoms and possible inflammatory component. - Prescribed prednisone 40 mg daily with weekly taper over one month. - Provided education on corticosteroid effects and indications. - Instructed her to monitor for symptom recurrence or corticosteroid side effects and report any concerns.  Plan - I reviewed her Guardant360 result, which showed PIK3CA mutation, I recommend change letrozole  to inavolisib, palbociclib  and fulvestrant  injection, I called in inavolisib 9 mg daily today, she has a 1 month supply of Ibrance , plan to start on December 29 -  Will schedule lab and fulvestrant  injection on December 29, and again January 13 - Lab and follow-up with me on January 13 - I called in tapering dose of prednisone for her   SUMMARY OF ONCOLOGIC HISTORY: Oncology History Overview Note  Cancer Staging Malignant neoplasm of upper-inner quadrant of left breast in female, estrogen receptor positive (HCC) Staging form: Breast, AJCC 8th Edition - Clinical stage from 04/13/2017: Stage IIA (cT2, cN0, cM0, G2, ER: Positive, PR: Negative, HER2: Negative) - Signed by [name], MD on 04/25/2017 - Pathologic stage from 06/13/2017: Stage IIB (pT2, pN1a, cM0, G1, ER: Positive, PR: Negative, HER2: Negative) - Signed by [name], MD on 09/04/2017     Malignant neoplasm of upper-inner quadrant of left breast in female, estrogen receptor positive (HCC)  04/10/2017 Mammogram   Left breast mammo and US  showed an irregular hypoechoic shadowing mass at 9 o'clock 1 cm from the nipple measuring 2.3 x 1 x 2.4 cm. This corresponds well with the mass seen at mammography. No lymphadenopathy seen in the left axilla.   04/18/2017 Initial Diagnosis   Malignant neoplasm of upper-inner quadrant of left breast in female, estrogen receptor positive (HCC)   04/22/2017 Receptors her2   Estrogen Receptor: 95%, POSITIVE, STRONG STAINING INTENSITY Progesterone Receptor: 0%, NEGATIVE Proliferation Marker Ki67: 10% HER2 -   04/22/2017 Initial Biopsy   Breast, left, needle core biopsy, 9:30 o'clock - INVASIVE DUCTAL CARCINOMA, G2 - DUCTAL CARCINOMA IN SITU.   06/13/2017 Surgery   Left breast mastectomy with sentinel lymph node biopsy performed by Dr Curvin.    06/13/2017 Pathology Results   Breast, simple mastectomy, Left - INVASIVE DUCTAL CARCINOMA, GRADE I/III, SPANNING 2.5 CM. - DUCTAL CARCINOMA IN SITU, INTERMEDIATE GRADE. - LOBULAR NEOPLASIA (ATYPICAL LOBULAR HYPERPLASIA). - THE SURGICAL RESECTION MARGINS  ARE NEGATIVE FOR CARCINOMA.  Lymph node, sentinel, biopsy, Left  axillary #1 - METASTATIC CARCINOMA IN 1 OF 3 LYMPH NODE (1/3), WITH EXTRACAPSULAR EXTENSION.   06/13/2017 Pathology Results   Mammaprint with low risk disease.    07/31/2017 - 09/10/2017 Radiation Therapy   The patient began a course of radiotherapy over the left chest wall and axillary area, supervised by Dr Lauraine Golden, MD.     Radiation treatment dates:   07/31/2017 - 09/10/2017   Site/dose:    1) Left Chest Wall / 50 Gy in 25 fractions 2) Left Supraclavicular / 45 Gy in 25 fractions 3) Left Chest Wall Scar Boost / 10 Gy in 5 fractions   Beams/energy:    1) Opposed tangents 3D/ 10 and 6 MV photons 2) Additional fields 3D / 10 MV and 6 MV photons 3) En face electrons / 6 MeV electrons   Narrative: The patient tolerated radiation treatment relatively well. She experienced mild fatigue. She reported pain, burning, and itching to the left breast and swelling under the left arm. On physical exam, she had dry peeling in the left UIQ of the breast and left axilla, as well as diffuse hyperpigmentation and erythema of the left chest wall. She is using the radiaplex cream to the treatment area and neosporin to the peeling areas.   10/13/2017 -  Anti-estrogen oral therapy   Tamoxifen  10mg  daily   04/11/2018 Mammogram   04/11/2018 Mammogram IMPRESSION: No mammographic evidence of malignancy. A result letter of this screening mammogram will be mailed directly to the patient.   04/11/2018 Mammogram   Screening Unilateral Right: No evidence of mammographic malignancy.      Discussed the use of AI scribe software for clinical note transcription with the patient, who gave verbal consent to proceed.  History of Present Illness Kristina Nicholson is a 58 year old female with metastatic breast cancer who presents for evaluation of persistent cough and review of recent test results.  She has metastatic breast cancer with a newly identified PIK3CA mutation and prior radiation to the left scapula,  previously treated with letrozole , Ibrance , and fulvestrant .  She has had a cough for over two months, initially severe with intermittent chest pain from coughing, now mild with occasional chest discomfort and not limiting daily activities. She denies fevers, chills, sweats, or diabetes.  Recent evaluation showed a negative viral panel and imaging with left lung consolidation possibly from prior radiation or atypical pneumonia. She completed two antibiotic courses with clinical improvement.     REVIEW OF SYSTEMS:   Constitutional: Denies fevers, chills or abnormal weight loss Eyes: Denies blurriness of vision Ears, nose, mouth, throat, and face: Denies mucositis or sore throat Respiratory: Denies cough, dyspnea or wheezes Cardiovascular: Denies palpitation, chest discomfort or lower extremity swelling Gastrointestinal:  Denies nausea, heartburn or change in bowel habits Skin: Denies abnormal skin rashes Lymphatics: Denies new lymphadenopathy or easy bruising Neurological:Denies numbness, tingling or new weaknesses Behavioral/Psych: Mood is stable, no new changes  All other systems were reviewed with the patient and are negative.  MEDICAL HISTORY:  Past Medical History:  Diagnosis Date   Chronic left shoulder pain    Headache    History of external beam radiation therapy    07-31-2017  to  09-10-2017  LEft chest wass 50 gy in 25 fractions, Left Supraclavicular 45 Gy in 25 fractions, Left Chest wall scar boost 10 Gy in 5 fractions.   Horseshoe kidney    Malignant neoplasm of upper-inner quadrant of left  breast in female, estrogen receptor positive (HCC) 04/18/2017   oncologist--- dr lanny;   dx 06/ 2018 ,  Stage IIB, ER/PR+, G3, IDC/ DCIS;  06-13-2017 s/p left mastectomy w/ node dissection ;  completed radiation 09-10-2017;   recurrence w/ mets to bone 07/ 2024 , started chemotherapy   Metastatic cancer to bone (HCC) 05/2023   Uterine fibroid     SURGICAL HISTORY: Past Surgical  History:  Procedure Laterality Date   APPENDECTOMY  2005   COLONOSCOPY  2016   GANGLION CYST EXCISION Right 09/24/2017   Procedure: EXCISION OF RIGHT DORSAL WRIST GANGLION;  Surgeon: Sebastian Lenis, MD;  Location: [facility];  Service: Orthopedics;  Laterality: Right;   LAPAROSCOPIC SALPINGO OOPHERECTOMY Bilateral 08/24/2023   Procedure: LAPAROSCOPIC SALPINGO OOPHORECTOMY;  Surgeon: Dannielle Bouchard, DO;  Location: [facility];  Service: Gynecology;  Laterality: Bilateral;   LIPOMA EXCISION Left 06/13/2017   Procedure: EXCISION 3CM LIPOMA ON LEFT ARM;  Surgeon: [name], MD;  Location: [facility] OR;  Service: General;  Laterality: Left;   MASTECTOMY W/ SENTINEL NODE BIOPSY Left 06/13/2017   Procedure: MASTECTOMY WITH SENTINEL LYMPH NODE BIOPSY;  Surgeon: [name], MD;  Location: [facility] OR;  Service: General;  Laterality: Left;   TOTAL HIP ARTHROPLASTY Right 09/30/2023   Procedure: TOTAL HIP ARTHROPLASTY ANTERIOR APPROACH;  Surgeon: Ernie Cough, MD;  Location: [facility];  Service: Orthopedics;  Laterality: Right;    I have reviewed the social history and family history with the patient and they are unchanged from previous note.  ALLERGIES:  has no known allergies.  MEDICATIONS:  Current Outpatient Medications  Medication Sig Dispense Refill   Inavolisib (ITOVEBI) 9 MG TABS tablet Take 1 tablet (9 mg total) by mouth daily. 28 tablet 1   predniSONE (DELTASONE) 10 MG tablet Take 4 tablets by mouth daily with breakfast for 7 days, THEN 3 tablets daily with breakfast for 7 days, THEN 2 tablets daily with breakfast for 7 days, THEN 1 tablet daily with breakfast for 5 days, THEN 1/2 tablet daily with breakfast for 4 days. 70 tablet 0   albuterol  (VENTOLIN  HFA) 108 (90 Base) MCG/ACT inhaler Inhale 2 puffs into the lungs every 6 (six) hours as needed for wheezing or shortness of breath. 6.7 g 0   doxycycline  (VIBRAMYCIN ) 100 MG capsule Take 1 capsule (100 mg total) by  mouth 2 (two) times daily for 7 days. 14 capsule 0   gabapentin  (NEURONTIN ) 300 MG capsule Take 1 capsule (300 mg total) by mouth 3 (three) times daily. 90 capsule 3   guaiFENesin -codeine  100-10 MG/5ML syrup Take 5 mLs by mouth every 4 (four) hours as needed for cough. 237 mL 0   letrozole  (FEMARA ) 2.5 MG tablet Take 1 tablet (2.5 mg total) by mouth daily. 30 tablet 2   loperamide (IMODIUM A-D) 2 MG tablet Take 2 mg by mouth 4 (four) times daily as needed for diarrhea or loose stools.     Multiple Vitamins-Minerals (MULTIVITAMIN WOMEN PO) Take 1 tablet by mouth daily with breakfast.     Nutritional Supplements (CHLORELLA-SPIRULINA COMPLEX PO) Take 1 capsule by mouth daily as needed (to aid digestive health).     palbociclib  (IBRANCE ) 125 MG tablet Take 1 tablet (125 mg total) by mouth daily. Take for 21 days on, 7 days off, repeat every 28 days. 21 tablet 2   No current facility-administered medications for this visit.    PHYSICAL EXAMINATION: Not performed   LABORATORY DATA:  I have  reviewed the data as listed    Latest Ref Rng & Units 10/23/2024   11:02 AM 09/29/2024    9:38 AM 09/18/2024    9:42 AM  CBC  WBC 4.0 - 10.5 K/uL 4.7  2.1  2.2   Hemoglobin 12.0 - 15.0 g/dL 87.6  88.9  89.0   Hematocrit 36.0 - 46.0 % 36.9  32.1  34.1   Platelets 150 - 400 K/uL 371  198  228         Latest Ref Rng & Units 10/23/2024   11:02 AM 09/29/2024    9:38 AM 09/18/2024    9:42 AM  CMP  Glucose 70 - 99 mg/dL 870  92  868   BUN 6 - 20 mg/dL 8  10  12    Creatinine 0.44 - 1.00 mg/dL 9.30  9.30  9.22   Sodium 135 - 145 mmol/L 142  143  142   Potassium 3.5 - 5.1 mmol/L 4.1  3.6  3.8   Chloride 98 - 111 mmol/L 104  108  106   CO2 22 - 32 mmol/L 27  30  25    Calcium 8.9 - 10.3 mg/dL 9.7  8.8  9.1   Total Protein 6.5 - 8.1 g/dL 7.6  6.4  7.1   Total Bilirubin 0.0 - 1.2 mg/dL 0.4  0.4  0.3   Alkaline Phos 38 - 126 U/L 73  48  65   AST 15 - 41 U/L 20  14  21    ALT 0 - 44 U/L 11  10  11         RADIOGRAPHIC STUDIES: I have personally reviewed the radiological images as listed and agreed with the findings in the report. No results found.     I discussed the assessment and treatment plan with the patient. The patient was provided an opportunity to ask questions and all were answered. The patient agreed with the plan and demonstrated an understanding of the instructions.   The patient was advised to call back or seek an in-person evaluation if the symptoms worsen or if the condition fails to improve as anticipated.  I provided 30 minutes of non face-to-face telephone visit time during this encounter, including review of chart and various tests results, discussions about plan of care and coordination of care plan.    Onita Mattock, MD 10/31/2024     "

## 2024-11-03 ENCOUNTER — Other Ambulatory Visit (HOSPITAL_COMMUNITY): Payer: Self-pay

## 2024-11-03 ENCOUNTER — Encounter: Payer: Self-pay | Admitting: Hematology

## 2024-11-03 ENCOUNTER — Telehealth: Payer: Self-pay

## 2024-11-03 ENCOUNTER — Telehealth: Payer: Self-pay | Admitting: Pharmacist

## 2024-11-03 ENCOUNTER — Other Ambulatory Visit: Payer: Self-pay

## 2024-11-03 DIAGNOSIS — Z170 Estrogen receptor positive status [ER+]: Secondary | ICD-10-CM

## 2024-11-03 MED ORDER — PALBOCICLIB 125 MG PO TABS
125.0000 mg | ORAL_TABLET | Freq: Every day | ORAL | 2 refills | Status: DC
  Filled 2024-11-04 – 2024-11-24 (×2): qty 21, 28d supply, fill #0

## 2024-11-03 NOTE — Telephone Encounter (Signed)
 Oral Oncology Patient Advocate Encounter  Prior Authorization for Itovebi  has been approved.    PA# [id number] Effective dates: 11/03/2024  through 11/03/2025  Patients co-pay is $0.00 .  See [facility] billing notes    Kristina Nicholson,  CPhT-Adv  she/her/hers [facility]  [facility] Specialty Pharmacy Services Pharmacy Technician Patient Advocate Specialist III [facility] Phone: [phone]  Fax: [phone] [email or web address]

## 2024-11-03 NOTE — Telephone Encounter (Signed)
 Oral Oncology Pharmacist Encounter  Received new prescription for Itovebi  (inavolisib ) for the treatment of metastatic HR positive, HER-2 negative, PIK3CA mutated breast cancer in conjunction with Ibrance  and fulvestrant , planned duration until disease progression or unacceptable drug toxicity.  CBC w/ Diff and CMP from 10/23/24 assessed, patient glucose of 129 mg/dL (unsure if this was fasting or not). Patient does not have history of T2DM, glucose will be monitored closely while on Itrovebi. Last A1c available is from 12/17/23, at that time A1c was 5.6%. Would recommend obtaining an updated A1c at next office visit. Prescription dose and frequency assessed for appropriateness.  Due to high risk of hyperglycemia and stomatitis with medication, OK per Dr. Lanny for patient to start dexamethasone  mouthwash PPX at beginning of treatment. Will utilize the dexamethasone  elixir due to the alcohol free dexamethasone  mouth wash being on backorder. Additionally, glucose monitoring kit Rx has been sent to pharmacy to allow patient to check FBG at home (for inavolisib  FBG is recommended to be checked once every 3 days for first week, then weekly for 3 weeks, then every 2 weeks for 8 weeks, then once every 4 weeks thereafter.)  Current medication list in Epic reviewed, no relevant/significant DDIs with Itovebi  identified.  Evaluated chart and no patient barriers to medication adherence noted.   Patient agreement for treatment documented in MD note on 10/31/24.  Prescription has been e-scribed to the [facility] for benefits analysis and approval.  Oral Oncology Clinic will continue to follow for insurance authorization, copayment issues, initial counseling and start date.  Asberry Macintosh, PharmD, BCPS, BCOP Hematology/Oncology Clinical Pharmacist [phone] 11/03/2024 8:40 AM

## 2024-11-03 NOTE — Telephone Encounter (Signed)
 Oral Oncology Patient Advocate Encounter   Received notification that prior authorization for Itovebi  is required.   PA submitted on 11/03/2024 Key [id number] Status is pending      Charlott Hamilton,  CPhT-Adv  she/her/hers [facility]  [facility] Specialty Pharmacy Services Pharmacy Technician Patient Advocate Specialist III [facility] Phone: [phone]  Fax: [phone] [email or web address]

## 2024-11-04 ENCOUNTER — Encounter: Payer: Self-pay | Admitting: Hematology

## 2024-11-04 ENCOUNTER — Other Ambulatory Visit: Payer: Self-pay

## 2024-11-04 ENCOUNTER — Telehealth: Payer: Self-pay

## 2024-11-04 ENCOUNTER — Other Ambulatory Visit (HOSPITAL_COMMUNITY): Payer: Self-pay

## 2024-11-04 LAB — GUARDANT 360

## 2024-11-04 MED ORDER — LANCET DEVICE MISC
1.0000 | 0 refills | Status: AC
  Filled 2024-11-04: qty 1, 30d supply, fill #0

## 2024-11-04 MED ORDER — BLOOD GLUCOSE TEST VI STRP
1.0000 | ORAL_STRIP | Freq: Three times a day (TID) | 0 refills | Status: AC
  Filled 2024-11-04 – 2024-11-07 (×2): qty 100, 34d supply, fill #0

## 2024-11-04 MED ORDER — DEXAMETHASONE 0.5 MG/5ML PO ELIX
1.0000 mg | ORAL_SOLUTION | Freq: Four times a day (QID) | ORAL | 2 refills | Status: AC
  Filled 2024-11-04 – 2024-11-10 (×2): qty 474, 12d supply, fill #0
  Filled 2024-12-18: qty 474, 12d supply, fill #1

## 2024-11-04 MED ORDER — LANCETS MISC. MISC
1.0000 | 0 refills | Status: AC
  Filled 2024-11-04 – 2024-11-07 (×2): qty 100, 30d supply, fill #0

## 2024-11-04 MED ORDER — BLOOD GLUCOSE MONITOR SYSTEM W/DEVICE KIT
1.0000 | PACK | 0 refills | Status: AC
  Filled 2024-11-04 – 2024-11-07 (×2): qty 1, 30d supply, fill #0

## 2024-11-04 MED ORDER — DEXAMETHASONE 0.5 MG/5ML PO SOLN
1.0000 mg | Freq: Four times a day (QID) | ORAL | 2 refills | Status: DC
  Filled 2024-11-04: qty 500, 13d supply, fill #0

## 2024-11-04 NOTE — Telephone Encounter (Signed)
 Pt's spouse called regarding pt's medication/s (Itovebi ) and wanted to know if the prescription could be sent to [facility] instead of [facility].  Stated this nurse will notify PO Chemo Pharmacist regarding pt's husband's request to see if the copay amount will change.

## 2024-11-04 NOTE — Progress Notes (Addendum)
 Specialty Pharmacy Initial Fill Coordination Note  Kristina Nicholson is a 58 y.o. female contacted today regarding refills of specialty medication(s) Inavolisib  (ITOVEBI ); Palbociclib  (IBRANCE ) .  Patient requested Pickup at [facility]  on 11/07/24   Medication Itovebi  will be filled on 11/04/2024.   Please hold Ibrance  for 1 month as patient has a supply on hand from filling through [facility]  Patient is aware of 0.00 copayment.  See special billing notes on file

## 2024-11-04 NOTE — Telephone Encounter (Signed)
 Oral Chemotherapy Pharmacist Encounter  I spoke with patient and patient's husband, Branna Cortina, for overview of: Itovebi  (inavolisib ) for the treatment of metastatic, hormone-receptor positive, HER2 receptor negative, PIK3CA mutated breast cancer, in combination with Ibrance  and fulvestrant  planned duration until disease progression or unacceptable toxicity.   Treatment goal: Palliative  Counseled on administration, dosing, side effects, monitoring, drug-food interactions, safe handling, storage, and disposal.  Patient will take Itovebi  9mg  tablets, 1 tablet by mouth once daily.  Itovebi  start date: 11/10/24  Adverse effects include but are not limited to: hyperglycemia, stomatitis, diarrhea, skin rash, nausea, decreased blood counts, fatigue Hyperglycemia: Blood glucose testing kit has been sent to the pharmacy for patient. Discussed how to check fasting blood glucose at home. Patient advised to check blood glucose once every 3 days for the first week (days 1 to 7), then weekly for 3 weeks (days 8 to 28), then every 2 weeks for the next 8 weeks, then once every 4 weeks thereafter. HgbaA1c is recommended to be obtained at baseline and every 3 months while on Itovebi . Patient instructed to hold Itovebi  and call the office if FBG is > 160 mg/dL. Of note, patient is on a prednisone  taper currently and this may also elevate blood sugars during the first few weeks of therapy. Stomatitis: Patient will utilize dexamethasone  0.5 mg/5mL mouthwash while on therapy (at least first month), starting with first day of treatment. Patient will swish 10 mL (1mg ) by mouth 4 times daily. Patient will swish for 2 minutes, then spit. Patient knows to avoid food/drink for 1 hour after utilizing mouthwash. Diarrhea: Patient will obtain anti diarrheal and alert the office of 4 or more loose stools above baseline.  Reviewed with patient importance of keeping a medication schedule and plan for any missed doses. No  barriers to medication adherence identified.  Medication reconciliation performed and medication/allergy list updated.  Distress thermometer flowsheet: Distress thermometer not completed during telephone call as patient has been on previous lines of therapy.   Communication and Learning Assessment Primary learner: Patient and patient's spouse Barriers to learning: No barriers Preferred language: English Learning preferences: Listening Reading  All questions answered.  Patient and patient's husband voiced understanding and appreciation.   Medication education handout placed in mail for patient. Patient knows to call the office with questions or concerns. Oral Chemotherapy Clinic phone number provided to patient.   Asberry Macintosh, PharmD, BCPS, BCOP Hematology/Oncology Clinical Pharmacist [phone] 11/04/2024 10:54 AM

## 2024-11-04 NOTE — Progress Notes (Signed)
 Oral Chemotherapy Pharmacist Encounter  Patient and patient's husband was counseled under telephone encounter from 11/03/24.  Kristina Nicholson, PharmD, BCPS, BCOP Hematology/Oncology Clinical Pharmacist [phone] 11/04/2024 11:33 AM

## 2024-11-05 ENCOUNTER — Other Ambulatory Visit: Payer: Self-pay

## 2024-11-07 ENCOUNTER — Other Ambulatory Visit (HOSPITAL_COMMUNITY): Payer: Self-pay

## 2024-11-07 ENCOUNTER — Encounter: Payer: Self-pay | Admitting: Hematology

## 2024-11-07 ENCOUNTER — Encounter (HOSPITAL_COMMUNITY): Payer: Self-pay

## 2024-11-10 ENCOUNTER — Inpatient Hospital Stay

## 2024-11-10 ENCOUNTER — Other Ambulatory Visit (HOSPITAL_COMMUNITY): Payer: Self-pay

## 2024-11-10 ENCOUNTER — Other Ambulatory Visit: Payer: Self-pay

## 2024-11-10 VITALS — BP 145/73 | HR 57 | Temp 98.0°F | Resp 14

## 2024-11-10 DIAGNOSIS — C50212 Malignant neoplasm of upper-inner quadrant of left female breast: Secondary | ICD-10-CM

## 2024-11-10 LAB — CBC WITH DIFFERENTIAL/PLATELET
Abs Immature Granulocytes: 0.03 K/uL (ref 0.00–0.07)
Basophils Absolute: 0 K/uL (ref 0.0–0.1)
Basophils Relative: 0 %
Eosinophils Absolute: 0 K/uL (ref 0.0–0.5)
Eosinophils Relative: 0 %
HCT: 36.6 % (ref 36.0–46.0)
Hemoglobin: 11.9 g/dL — ABNORMAL LOW (ref 12.0–15.0)
Immature Granulocytes: 0 %
Lymphocytes Relative: 12 %
Lymphs Abs: 1.2 K/uL (ref 0.7–4.0)
MCH: 32.7 pg (ref 26.0–34.0)
MCHC: 32.5 g/dL (ref 30.0–36.0)
MCV: 100.5 fL — ABNORMAL HIGH (ref 80.0–100.0)
Monocytes Absolute: 0.3 K/uL (ref 0.1–1.0)
Monocytes Relative: 3 %
Neutro Abs: 8 K/uL — ABNORMAL HIGH (ref 1.7–7.7)
Neutrophils Relative %: 85 %
Platelets: 267 K/uL (ref 150–400)
RBC: 3.64 MIL/uL — ABNORMAL LOW (ref 3.87–5.11)
RDW: 13.7 % (ref 11.5–15.5)
WBC: 9.6 K/uL (ref 4.0–10.5)
nRBC: 0 % (ref 0.0–0.2)

## 2024-11-10 LAB — COMPREHENSIVE METABOLIC PANEL WITH GFR
ALT: 27 U/L (ref 0–44)
AST: 25 U/L (ref 15–41)
Albumin: 4.2 g/dL (ref 3.5–5.0)
Alkaline Phosphatase: 68 U/L (ref 38–126)
Anion gap: 10 (ref 5–15)
BUN: 11 mg/dL (ref 6–20)
CO2: 26 mmol/L (ref 22–32)
Calcium: 9 mg/dL (ref 8.9–10.3)
Chloride: 103 mmol/L (ref 98–111)
Creatinine, Ser: 0.74 mg/dL (ref 0.44–1.00)
GFR, Estimated: >60 mL/min
Glucose, Bld: 294 mg/dL — ABNORMAL HIGH (ref 70–99)
Potassium: 3.6 mmol/L (ref 3.5–5.1)
Sodium: 140 mmol/L (ref 135–145)
Total Bilirubin: 0.2 mg/dL (ref 0.0–1.2)
Total Protein: 6.9 g/dL (ref 6.5–8.1)

## 2024-11-10 MED ORDER — FULVESTRANT 250 MG/5ML IM SOSY
500.0000 mg | PREFILLED_SYRINGE | Freq: Once | INTRAMUSCULAR | Status: AC
  Administered 2024-11-10: 500 mg via INTRAMUSCULAR

## 2024-11-11 ENCOUNTER — Telehealth: Payer: Self-pay | Admitting: Pharmacist

## 2024-11-11 ENCOUNTER — Other Ambulatory Visit: Payer: Self-pay

## 2024-11-11 NOTE — Telephone Encounter (Signed)
 Oral Oncology Pharmacist Encounter  Received call from patient's husband, Kristina Nicholson regarding concern for hyperglycemia with Itovebi . Patient took first dose on 11/10/24 AM and CMP reading that afternoon was 294 mg/dL. Patient is currently on a prednisone  taper that will finish on 12/04/23, which is interfering with accurate blood glucose readings to decide correct dose of Itovebi .  Discussed the above with Lacie Burton, NP and decision was made for patient to stop taking Itovebi  until she has finished her prednisone  taper.   I called and updated patient's husband on the above. Tentative plan is for patient to start Itovebi  on 12/05/23 as her blood glucose levels should have normalized by then. Since patient will not be on Itovebi  until 12/05/23, I have instructed husband for patient to also hold off on the dexamethasone  mouthwash as that is not needed until she starts Itovebi .    I will touch base with patient and husband on 12/02/23 to re-review blood glucose checks at home.   Asberry Macintosh, PharmD, BCPS, BCOP Hematology/Oncology Clinical Pharmacist [phone] 11/11/2024 12:28 PM

## 2024-11-17 ENCOUNTER — Encounter: Payer: Self-pay | Admitting: Hematology

## 2024-11-17 ENCOUNTER — Other Ambulatory Visit (HOSPITAL_COMMUNITY): Payer: Self-pay

## 2024-11-19 ENCOUNTER — Other Ambulatory Visit: Payer: Self-pay

## 2024-11-20 ENCOUNTER — Other Ambulatory Visit: Payer: Self-pay

## 2024-11-24 ENCOUNTER — Other Ambulatory Visit (HOSPITAL_COMMUNITY): Payer: Self-pay

## 2024-11-24 ENCOUNTER — Other Ambulatory Visit: Payer: Self-pay

## 2024-11-24 ENCOUNTER — Inpatient Hospital Stay

## 2024-11-24 ENCOUNTER — Encounter: Payer: Self-pay | Admitting: Hematology

## 2024-11-24 NOTE — Assessment & Plan Note (Addendum)
 [name], stage IIB, ER: Positive, PR: Negative, HER2: Negative, Grade 3, bone metastasis in 05/2023 -She was diagnosed in 04/2017. She is s/p left breast mastectomy and adjuvant radiation. Mammaprint showed low risk. -She started antiestrogen therapy with Tamoxifen  in 10/2017. She is tolerating well, plan for 10 year. She has not had a menstrual period since 2018.  Her FSH in March 2021 showed that she is still premenopausal --Due to worsening left shoulder pain, she underwent CT and bone scan in [place] during her trip. Unfortunately scan showed multiple bone lesion and uptake in sternum, cervical spine, left scapular, left 10th rib, and left hip, concerning for bone mets. She underwent bone biopsy of left scapula and sternum, both confirmed metastatic breast cancer, ER positive (weak to moderate in left scapula biopsy, and 80% strong positive in sternal biopsy), PR weak to moderate positive, HER2 2+ in left scapular, 1+ in sternal bone biopsy -PET scan from June 21, 2023 showed multifocal hypermetabolic osseous metastatic disease. No definite pathologic fracture or epidural tumor identified.  No visceral metastasis.   -she started fulvestrant  injection and Verzenio  on August 13, 2023.  Due to poor tolerance to Verzenio , dose was reduced to 100 mg twice daily -She underwent BSO on August 24, 2023, surgical path was benign. -Due to severe right hip pain from bone metastasis, she underwent right hip replacement on September 30, 2023 -Restaging PET scan November 08, 2023 showed excellent response in bone metastasis.  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 -Due to her GI side effect, I changed Verzenio  to Kisqali 600mg  daily 3 weeks on and one week off in mid Feb 2025, and it was changed to Ibrance  in mid March due to poor tolerance. She tolerates Ibrance  much better  -PET 05/26/24 showed bone met progression in left scapular, other bone mets are  well controlled. She underwent RT to left scapula  -PET 10/20/2024 unfortunately showed disease progression in her bones.  Guardant360 showed PIK3CA mutation positive. -Treatment changed to inavolisib , palbociclib  and fulvestrant  injection on 11/10/2024, Itovebi  caused hyperglycemia and it was held until 12/05/2023 after her prednisone  is tapered off

## 2024-11-25 ENCOUNTER — Encounter: Payer: Self-pay | Admitting: Hematology

## 2024-11-25 ENCOUNTER — Inpatient Hospital Stay

## 2024-11-25 ENCOUNTER — Inpatient Hospital Stay: Attending: Hematology | Admitting: Hematology

## 2024-11-25 VITALS — BP 136/72 | HR 75 | Temp 97.3°F | Resp 16 | Ht 63.0 in | Wt 128.9 lb

## 2024-11-25 DIAGNOSIS — Z170 Estrogen receptor positive status [ER+]: Secondary | ICD-10-CM

## 2024-11-25 DIAGNOSIS — C50212 Malignant neoplasm of upper-inner quadrant of left female breast: Secondary | ICD-10-CM

## 2024-11-25 LAB — COMPREHENSIVE METABOLIC PANEL WITH GFR
ALT: 19 U/L (ref 0–44)
AST: 18 U/L (ref 15–41)
Albumin: 4.2 g/dL (ref 3.5–5.0)
Alkaline Phosphatase: 56 U/L (ref 38–126)
Anion gap: 16 — ABNORMAL HIGH (ref 5–15)
BUN: 15 mg/dL (ref 6–20)
CO2: 22 mmol/L (ref 22–32)
Calcium: 9.1 mg/dL (ref 8.9–10.3)
Chloride: 103 mmol/L (ref 98–111)
Creatinine, Ser: 0.88 mg/dL (ref 0.44–1.00)
GFR, Estimated: >60 mL/min
Glucose, Bld: 135 mg/dL — ABNORMAL HIGH (ref 70–99)
Potassium: 3.5 mmol/L (ref 3.5–5.1)
Sodium: 141 mmol/L (ref 135–145)
Total Bilirubin: 0.4 mg/dL (ref 0.0–1.2)
Total Protein: 6.8 g/dL (ref 6.5–8.1)

## 2024-11-25 LAB — CBC WITH DIFFERENTIAL/PLATELET
Abs Immature Granulocytes: 0.01 K/uL (ref 0.00–0.07)
Basophils Absolute: 0 K/uL (ref 0.0–0.1)
Basophils Relative: 0 %
Eosinophils Absolute: 0 K/uL (ref 0.0–0.5)
Eosinophils Relative: 1 %
HCT: 34.5 % — ABNORMAL LOW (ref 36.0–46.0)
Hemoglobin: 11.9 g/dL — ABNORMAL LOW (ref 12.0–15.0)
Immature Granulocytes: 0 %
Lymphocytes Relative: 44 %
Lymphs Abs: 1.5 K/uL (ref 0.7–4.0)
MCH: 33.8 pg (ref 26.0–34.0)
MCHC: 34.5 g/dL (ref 30.0–36.0)
MCV: 98 fL (ref 80.0–100.0)
Monocytes Absolute: 0.1 K/uL (ref 0.1–1.0)
Monocytes Relative: 4 %
Neutro Abs: 1.7 K/uL (ref 1.7–7.7)
Neutrophils Relative %: 51 %
Platelets: 154 K/uL (ref 150–400)
RBC: 3.52 MIL/uL — ABNORMAL LOW (ref 3.87–5.11)
RDW: 14.8 % (ref 11.5–15.5)
Smear Review: NORMAL
WBC: 3.3 K/uL — ABNORMAL LOW (ref 4.0–10.5)
nRBC: 0 % (ref 0.0–0.2)

## 2024-11-25 MED ORDER — FULVESTRANT 250 MG/5ML IM SOSY
500.0000 mg | PREFILLED_SYRINGE | Freq: Once | INTRAMUSCULAR | Status: AC
  Administered 2024-11-25: 500 mg via INTRAMUSCULAR
  Filled 2024-11-25: qty 10

## 2024-11-25 NOTE — Progress Notes (Signed)
 Per Lanny, MD, pt to only receive Faslodex  today, no Zometa . Pt aware and agreeable.

## 2024-11-25 NOTE — Patient Instructions (Signed)
 Fulvestrant Injection What is this medication? FULVESTRANT (ful VES trant) treats breast cancer. It works by blocking the hormone estrogen in breast tissue, which prevents breast cancer cells from spreading or growing. This medicine may be used for other purposes; ask your health care provider or pharmacist if you have questions. COMMON BRAND NAME(S): FASLODEX What should I tell my care team before I take this medication? They need to know if you have any of these conditions: Bleeding disorder Liver disease Low blood cell levels (white cells, red cells, and platelets) An unusual or allergic reaction to fulvestrant, other medications, foods, dyes, or preservatives Pregnant or trying to get pregnant Breastfeeding How should I use this medication? This medication is injected into a muscle. It is given by your care team in a hospital or clinic setting. Talk to your care team about the use of this medication in children. Special care may be needed. Overdosage: If you think you have taken too much of this medicine contact a poison control center or emergency room at once. NOTE: This medicine is only for you. Do not share this medicine with others. What if I miss a dose? Keep appointments for follow-up doses. It is important not to miss your dose. Call your care team if you are unable to keep an appointment. What may interact with this medication? Fluoroestradiol F18 This list may not describe all possible interactions. Give your health care provider a list of all the medicines, herbs, non-prescription drugs, or dietary supplements you use. Also tell them if you smoke, drink alcohol, or use illegal drugs. Some items may interact with your medicine. What should I watch for while using this medication? Your condition will be monitored carefully while you are receiving this medication. You may need blood work done while you are taking this medication. This medication is injected into a muscle. Talk  to your care team if you also take medications that prevent or treat blood clots, such as warfarin. Blood thinners may increase the risk of bleeding or bruising in the muscle where this medication is injected. The benefits of this medication may outweigh the risks. Your care team can help you find the option that works for you. They can also help limit the risk of bleeding. Talk to your care team if you may be pregnant. Serious birth defects can occur if you take this medication during pregnancy and for 1 year after the last dose. You will need a negative pregnancy test before starting this medication. Contraception is recommended while taking this medication and for 1 year after the last dose. Your care team can help you find the option that works for you. Do not breastfeed while taking this medication and for 1 year after the last dose. This medication may cause infertility. Talk to your care team if you are concerned about your fertility. What side effects may I notice from receiving this medication? Side effects that you should report to your care team as soon as possible: Allergic reactions or angioedema--skin rash, itching or hives, swelling of the face, eyes, lips, tongue, arms, or legs, trouble swallowing or breathing Pain, tingling, or numbness in the hands or feet Side effects that usually do not require medical attention (report to your care team if they continue or are bothersome): Bone, joint, or muscle pain Constipation Headache Hot flashes Nausea Pain, redness, or irritation at injection site Unusual weakness or fatigue This list may not describe all possible side effects. Call your doctor for medical advice about side  effects. You may report side effects to FDA at 1-800-FDA-1088. Where should I keep my medication? This medication is given in a hospital or clinic. It will not be stored at home. NOTE: This sheet is a summary. It may not cover all possible information. If you have  questions about this medicine, talk to your doctor, pharmacist, or health care provider.  2024 [employer]/Gold Standard (2023-07-06 00:00:00)

## 2024-11-25 NOTE — Progress Notes (Signed)
 " [facility]   [phone] [phone]   Clinic Follow up Note   Patient Care Team: Pcp, No as PCP - General [name], MD as Consulting Physician (Hematology) [name], MD as Consulting Physician (General Surgery) [name], MD as Attending Physician (Radiation Oncology) Pickenpack-Cousar, [name], NP as Nurse Practitioner (Hospice and Palliative Medicine)  Date of Service:  11/25/2024  CHIEF COMPLAINT: f/u of metastatic breast cancer  CURRENT THERAPY:  Ibrance  125 mg daily for 3 weeks on and 1 week off, fulvestrant  injection with loading dose Plan to start Itovebi  9mg  daily in one week   Oncology History   Malignant neoplasm of upper-inner quadrant of left breast in female, estrogen receptor positive (HCC) [name], stage IIB, ER: Positive, PR: Negative, HER2: Negative, Grade 3, bone metastasis in 05/2023 -She was diagnosed in 04/2017. She is s/p left breast mastectomy and adjuvant radiation. Mammaprint showed low risk. -She started antiestrogen therapy with Tamoxifen  in 10/2017. She is tolerating well, plan for 10 year. She has not had a menstrual period since 2018.  Her FSH in March 2021 showed that she is still premenopausal --Due to worsening left shoulder pain, she underwent CT and bone scan in [place] during her trip. Unfortunately scan showed multiple bone lesion and uptake in sternum, cervical spine, left scapular, left 10th rib, and left hip, concerning for bone mets. She underwent bone biopsy of left scapula and sternum, both confirmed metastatic breast cancer, ER positive (weak to moderate in left scapula biopsy, and 80% strong positive in sternal biopsy), PR weak to moderate positive, HER2 2+ in left scapular, 1+ in sternal bone biopsy -PET scan from June 21, 2023 showed multifocal hypermetabolic osseous metastatic disease. No definite pathologic fracture or epidural tumor identified.  No visceral metastasis.   -she started  fulvestrant  injection and Verzenio  on August 13, 2023.  Due to poor tolerance to Verzenio , dose was reduced to 100 mg twice daily -She underwent BSO on August 24, 2023, surgical path was benign. -Due to severe right hip pain from bone metastasis, she underwent right hip replacement on September 30, 2023 -Restaging PET scan November 08, 2023 showed excellent response in bone metastasis.  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 -Due to her GI side effect, I changed Verzenio  to Kisqali 600mg  daily 3 weeks on and one week off in mid Feb 2025, and it was changed to Ibrance  in mid March due to poor tolerance. She tolerates Ibrance  much better  -PET 05/26/24 showed bone met progression in left scapular, other bone mets are well controlled. She underwent RT to left scapula  -PET 10/20/2024 unfortunately showed disease progression in her bones.  Guardant360 showed PIK3CA mutation positive. -Treatment changed to inavolisib , palbociclib  and fulvestrant  injection on 11/10/2024, Itovebi  caused hyperglycemia and it was held until 12/05/2023 after her prednisone  is tapered off  Assessment & Plan Estrogen receptor positive left breast cancer She is undergoing treatment for metastatic estrogen receptor positive left breast cancer and is currently tolerating Ibrance  and fulvestrant  injection without significant gastrointestinal or other adverse effects. Therapy will continue as long as effective, with the understanding that resistance may develop, necessitating a switch to chemotherapy. Ibrance  offers a more favorable side effect profile compared to intravenous chemotherapy. Port placement may be required if intravenous chemotherapy is initiated in future - Continued Ibrance  therapy; refills verified. - Administered second fulvestrant  injection. -Itovebi  has been held due to hyperglycemia when she is still on  prednisone , plan to start in 1 week when she  completes tapering prednisone . - Discussed therapy duration and potential transition to chemotherapy upon resistance. - Provided anticipatory guidance regarding possible future port placement for intravenous chemotherapy. - Scheduled follow-up in two weeks.  Bone mets  Plan to start Zometa . Zometa  increases the risk of hypocalcemia, requiring adequate calcium and vitamin D supplementation. There is a risk of osteonecrosis of the jaw, necessitating dental clearance prior to future infusions. - Administered Zometa  infusion after dental clearance  - Advised to increase calcium intake to at least 600-800 mg daily, as current multivitamin is insufficient. - Confirmed ongoing vitamin D supplementation. - Emphasized importance of routine dental care; recommended at least annual dental cleaning and examination. - Advised to obtain dental clearance prior to future Zometa  infusions and address dental issues before continuing therapy. - Provided information on local dentists and free clinic dental resources.  Radiation pneumonitis -She is on tapering dose steroids and responded well, cough near resolved.  She has side effects including palpitations, increased appetite, and insomnia, expected to resolve after steroid taper completion. She is not diabetic (recent A1c 5.6). She will begin a new medication after steroid taper, with plans for close glucose monitoring due to ongoing risk. - Instructed to complete prednisone  taper as outlined (1 tablet daily for 3 days, then 0.5 tablet daily for 4 days, then discontinue). - Advised to monitor blood glucose when starting new medication post-steroid. - Recommended dietary modifications to minimize hyperglycemia (reduce sweets/carbohydrates, increase protein/vegetables). - Coordinated with pharmacy for follow-up and blood glucose testing schedule. - Planned to order A1c at next visit in two weeks.  Plan - She is tolerating Ibrance  and fulvestrant  injection well,  will continue - He is on prednisone  10 mg daily now, will continue for 3 more days, then changed to 5 mg daily for additional 4 more days then stop. - She will start Itobevi in one week -will check A1c on next lab -lab, follow-up and fulvestrant  injection in 2 weeks - Will proceed fulvestrant  injection today, and hold Zometa  for now, I gave her her dental clearance letter, and give her a few options of dental offices she will call for appointment.   SUMMARY OF ONCOLOGIC HISTORY: Oncology History Overview Note  Cancer Staging Malignant neoplasm of upper-inner quadrant of left breast in female, estrogen receptor positive (HCC) Staging form: Breast, AJCC 8th Edition - Clinical stage from 04/13/2017: Stage IIA (cT2, cN0, cM0, G2, ER: Positive, PR: Negative, HER2: Negative) - Signed by [name], MD on 04/25/2017 - Pathologic stage from 06/13/2017: Stage IIB (pT2, pN1a, cM0, G1, ER: Positive, PR: Negative, HER2: Negative) - Signed by [name], MD on 09/04/2017     Malignant neoplasm of upper-inner quadrant of left breast in female, estrogen receptor positive (HCC)  04/10/2017 Mammogram   Left breast mammo and US  showed an irregular hypoechoic shadowing mass at 9 o'clock 1 cm from the nipple measuring 2.3 x 1 x 2.4 cm. This corresponds well with the mass seen at mammography. No lymphadenopathy seen in the left axilla.   04/18/2017 Initial Diagnosis   Malignant neoplasm of upper-inner quadrant of left breast in female, estrogen receptor positive (HCC)   04/22/2017 Receptors her2   Estrogen Receptor: 95%, POSITIVE, STRONG STAINING INTENSITY Progesterone Receptor: 0%, NEGATIVE Proliferation Marker Ki67: 10% HER2 -   04/22/2017 Initial Biopsy   Breast, left, needle core biopsy, 9:30 o'clock - INVASIVE DUCTAL CARCINOMA, G2 - DUCTAL CARCINOMA IN SITU.   06/13/2017 Surgery   Left  breast mastectomy with sentinel lymph node biopsy performed by Dr Curvin.    06/13/2017 Pathology Results   Breast, simple  mastectomy, Left - INVASIVE DUCTAL CARCINOMA, GRADE I/III, SPANNING 2.5 CM. - DUCTAL CARCINOMA IN SITU, INTERMEDIATE GRADE. - LOBULAR NEOPLASIA (ATYPICAL LOBULAR HYPERPLASIA). - THE SURGICAL RESECTION MARGINS ARE NEGATIVE FOR CARCINOMA.  Lymph node, sentinel, biopsy, Left axillary #1 - METASTATIC CARCINOMA IN 1 OF 3 LYMPH NODE (1/3), WITH EXTRACAPSULAR EXTENSION.   06/13/2017 Pathology Results   Mammaprint with low risk disease.    07/31/2017 - 09/10/2017 Radiation Therapy   The patient began a course of radiotherapy over the left chest wall and axillary area, supervised by Dr Lauraine Golden, MD.     Radiation treatment dates:   07/31/2017 - 09/10/2017   Site/dose:    1) Left Chest Wall / 50 Gy in 25 fractions 2) Left Supraclavicular / 45 Gy in 25 fractions 3) Left Chest Wall Scar Boost / 10 Gy in 5 fractions   Beams/energy:    1) Opposed tangents 3D/ 10 and 6 MV photons 2) Additional fields 3D / 10 MV and 6 MV photons 3) En face electrons / 6 MeV electrons   Narrative: The patient tolerated radiation treatment relatively well. She experienced mild fatigue. She reported pain, burning, and itching to the left breast and swelling under the left arm. On physical exam, she had dry peeling in the left UIQ of the breast and left axilla, as well as diffuse hyperpigmentation and erythema of the left chest wall. She is using the radiaplex cream to the treatment area and neosporin to the peeling areas.   10/13/2017 -  Anti-estrogen oral therapy   Tamoxifen  10mg  daily   04/11/2018 Mammogram   04/11/2018 Mammogram IMPRESSION: No mammographic evidence of malignancy. A result letter of this screening mammogram will be mailed directly to the patient.   04/11/2018 Mammogram   Screening Unilateral Right: No evidence of mammographic malignancy.       Discussed the use of AI scribe software for clinical note transcription with the patient, who gave verbal consent to proceed.  History of Present  Illness Kristina Nicholson is a 59 year old female with estrogen receptor positive left breast cancer with bone metastases who presents for oncology follow-up and management of ongoing therapy.  She recently restarted Ibrance  and is tolerating it without gastrointestinal side effects. She is on her second Zometa  injection for bone metastases and is aware of the need for calcium supplementation due to hypocalcemia risk. She is due for her next WestTran injection. She denies muscle cramps, fever, abdominal pain, diarrhea, or other new symptoms.  She is tapering prednisone  and is currently taking one tablet daily after weekly dose reductions over the past month. While on prednisone  she has noticed palpitations, a sensation of heart racing, nervousness, increased appetite, insomnia, and increased energy, but her heart rate has stayed around 75 bpm.  She previously tried another medication that caused elevated blood glucose after one day, so it was stopped. She plans to monitor blood glucose at home when restarting that medication after completing her steroid taper. Her last hemoglobin A1c was 5.6 in February, and she has not been diagnosed with diabetes.  She takes a daily multivitamin with vitamin D and vitamin C, is unsure of calcium content, and does not take a separate calcium supplement. The need for adequate calcium intake with Zometa  to lower the risk of hypocalcemia and muscle cramps was reviewed. She has not had recent dental problems and last had  a deep cleaning last year, but does not currently have a regular dentist.  She is scheduled for blood glucose testing coordinated by the pharmacist later this month. She has no additional new concerns relevant to her cancer treatment at this time.     All other systems were reviewed with the patient and are negative.  MEDICAL HISTORY:  Past Medical History:  Diagnosis Date   Chronic left shoulder pain    Headache    History of external beam  radiation therapy    07-31-2017  to  09-10-2017  LEft chest wass 50 gy in 25 fractions, Left Supraclavicular 45 Gy in 25 fractions, Left Chest wall scar boost 10 Gy in 5 fractions.   Horseshoe kidney    Malignant neoplasm of upper-inner quadrant of left breast in female, estrogen receptor positive (HCC) 04/18/2017   oncologist--- dr lanny;   dx 06/ 2018 ,  Stage IIB, ER/PR+, G3, IDC/ DCIS;  06-13-2017 s/p left mastectomy w/ node dissection ;  completed radiation 09-10-2017;   recurrence w/ mets to bone 07/ 2024 , started chemotherapy   Metastatic cancer to bone (HCC) 05/2023   Uterine fibroid     SURGICAL HISTORY: Past Surgical History:  Procedure Laterality Date   APPENDECTOMY  2005   COLONOSCOPY  2016   GANGLION CYST EXCISION Right 09/24/2017   Procedure: EXCISION OF RIGHT DORSAL WRIST GANGLION;  Surgeon: Sebastian Lenis, MD;  Location: [facility];  Service: Orthopedics;  Laterality: Right;   LAPAROSCOPIC SALPINGO OOPHERECTOMY Bilateral 08/24/2023   Procedure: LAPAROSCOPIC SALPINGO OOPHORECTOMY;  Surgeon: Dannielle Bouchard, DO;  Location: [facility];  Service: Gynecology;  Laterality: Bilateral;   LIPOMA EXCISION Left 06/13/2017   Procedure: EXCISION 3CM LIPOMA ON LEFT ARM;  Surgeon: [name], MD;  Location: [facility] OR;  Service: General;  Laterality: Left;   MASTECTOMY W/ SENTINEL NODE BIOPSY Left 06/13/2017   Procedure: MASTECTOMY WITH SENTINEL LYMPH NODE BIOPSY;  Surgeon: [name], MD;  Location: [facility] OR;  Service: General;  Laterality: Left;   TOTAL HIP ARTHROPLASTY Right 09/30/2023   Procedure: TOTAL HIP ARTHROPLASTY ANTERIOR APPROACH;  Surgeon: Ernie Cough, MD;  Location: [facility];  Service: Orthopedics;  Laterality: Right;    I have reviewed the social history and family history with the patient and they are unchanged from previous note.  ALLERGIES:  has no known allergies.  MEDICATIONS:  Current Outpatient Medications  Medication Sig Dispense  Refill   Accu-Chek Softclix Lancets lancets Use as directed to check blood glucose 2 - 3 times daily 100 each 0   albuterol  (VENTOLIN  HFA) 108 (90 Base) MCG/ACT inhaler Inhale 2 puffs into the lungs every 6 (six) hours as needed for wheezing or shortness of breath. 6.7 g 0   Blood Glucose Monitoring Suppl (BLOOD GLUCOSE MONITOR SYSTEM) w/Device KIT Check fasting glucose as directed 1 kit 0   dexamethasone  0.5 MG/5ML elixir Take 10 mLs (1 mg total) by mouth in the morning, at noon, in the evening, and at bedtime. Swish for 2 minutes in mouth and then spit. Do not eat or drink for 1 hour after mouth rinse 474 mL 2   doxycycline  (VIBRAMYCIN ) 100 MG capsule Take 1 capsule (100 mg total) by mouth 2 (two) times daily for 7 days. 14 capsule 0   gabapentin  (NEURONTIN ) 300 MG capsule Take 1 capsule (300 mg total) by mouth 3 (three) times daily. 90 capsule 3   Glucose Blood (BLOOD GLUCOSE TEST STRIPS) STRP Check blood glucose 2 -  3 times daily. Or as directed. 100 strip 0   guaiFENesin -codeine  100-10 MG/5ML syrup Take 5 mLs by mouth every 4 (four) hours as needed for cough. 237 mL 0   Inavolisib  (ITOVEBI ) 9 MG TABS tablet Take 1 tablet (9 mg total) by mouth daily. 28 tablet 1   Lancet Device MISC 1 each by Does not apply route as directed. Check fasting glucose once every 3 days for first week, then weekly for 3 weeks, then every 2 weeks for 8 weeks, then once every 4 weeks thereafter. May substitute to any manufacturer covered by patient's insurance. 1 each 0   loperamide (IMODIUM A-D) 2 MG tablet Take 2 mg by mouth 4 (four) times daily as needed for diarrhea or loose stools.     Multiple Vitamins-Minerals (MULTIVITAMIN WOMEN PO) Take 1 tablet by mouth daily with breakfast.     Nutritional Supplements (CHLORELLA-SPIRULINA COMPLEX PO) Take 1 capsule by mouth daily as needed (to aid digestive health).     palbociclib  (IBRANCE ) 125 MG tablet Take 1 tablet (125 mg total) by mouth daily. Take for 21 days on, 7 days  off, repeat every 28 days. 21 tablet 2   predniSONE  (DELTASONE ) 10 MG tablet Take 4 tablets by mouth daily with breakfast for 7 days, THEN 3 tablets daily with breakfast for 7 days, THEN 2 tablets daily with breakfast for 7 days, THEN 1 tablet daily with breakfast for 5 days, THEN 1/2 tablet daily with breakfast for 4 days. 70 tablet 0   No current facility-administered medications for this visit.    PHYSICAL EXAMINATION: ECOG PERFORMANCE STATUS: 1 - Symptomatic but completely ambulatory  Vitals:   11/25/24 0845 11/25/24 0852  BP: (!) 144/70 136/72  Pulse: 75   Resp: 16   Temp: (!) 97.3 F (36.3 C)   SpO2: 97%    Wt Readings from Last 3 Encounters:  11/25/24 128 lb 14.4 oz (58.5 kg)  10/23/24 127 lb (57.6 kg)  09/29/24 129 lb (58.5 kg)     GENERAL:alert, no distress and comfortable SKIN: skin color, texture, turgor are normal, no rashes or significant lesions EYES: normal, Conjunctiva are pink and non-injected, sclera clear NECK: supple, thyroid  normal size, non-tender, without nodularity LYMPH:  no palpable lymphadenopathy in the cervical, axillary  LUNGS: clear to auscultation and percussion with normal breathing effort HEART: regular rate & rhythm and no murmurs and no lower extremity edema ABDOMEN:abdomen soft, non-tender and normal bowel sounds Musculoskeletal:no cyanosis of digits and no clubbing  NEURO: alert & oriented x 3 with fluent speech, no focal motor/sensory deficits  Physical Exam VITALS: P- 75  LABORATORY DATA:  I have reviewed the data as listed    Latest Ref Rng & Units 11/25/2024    8:35 AM 11/10/2024    1:53 PM 10/23/2024   11:02 AM  CBC  WBC 4.0 - 10.5 K/uL 3.3  9.6  4.7   Hemoglobin 12.0 - 15.0 g/dL 88.0  88.0  87.6   Hematocrit 36.0 - 46.0 % 34.5  36.6  36.9   Platelets 150 - 400 K/uL 154  267  371         Latest Ref Rng & Units 11/25/2024    8:35 AM 11/10/2024    1:53 PM 10/23/2024   11:02 AM  CMP  Glucose 70 - 99 mg/dL 864  705  870    BUN 6 - 20 mg/dL 15  11  8    Creatinine 0.44 - 1.00 mg/dL 9.11  9.25  9.30  Sodium 135 - 145 mmol/L 141  140  142   Potassium 3.5 - 5.1 mmol/L 3.5  3.6  4.1   Chloride 98 - 111 mmol/L 103  103  104   CO2 22 - 32 mmol/L 22  26  27    Calcium 8.9 - 10.3 mg/dL 9.1  9.0  9.7   Total Protein 6.5 - 8.1 g/dL 6.8  6.9  7.6   Total Bilirubin 0.0 - 1.2 mg/dL 0.4  0.2  0.4   Alkaline Phos 38 - 126 U/L 56  68  73   AST 15 - 41 U/L 18  25  20    ALT 0 - 44 U/L 19  27  11        RADIOGRAPHIC STUDIES: I have personally reviewed the radiological images as listed and agreed with the findings in the report. No results found.    No orders of the defined types were placed in this encounter.  All questions were answered. The patient knows to call the clinic with any problems, questions or concerns. No barriers to learning was detected. The total time spent in the appointment was 40 minutes, including review of chart and various tests results, discussions about plan of care and coordination of care plan     Onita Mattock, MD 11/25/2024     "

## 2024-11-26 LAB — CANCER ANTIGEN 27.29: CA 27.29: 25 U/mL (ref 0.0–38.6)

## 2024-12-02 ENCOUNTER — Other Ambulatory Visit: Payer: Self-pay

## 2024-12-02 ENCOUNTER — Telehealth: Payer: Self-pay | Admitting: Pharmacist

## 2024-12-02 NOTE — Telephone Encounter (Signed)
 Oral Oncology Pharmacist Encounter  Called and spoke with patient's husband, Kristina Nicholson regarding plan for at home blood glucose monitoring when patient starts her Itovebi  (inavolisib ). Patient will start Itovebi  on 12/03/24 AM and take fasting blood glucoses at home on the following dates: 12/03/24 before starting medication, 12/06/24, 12/10/24, 12/17/24, 12/24/24, 01/07/25, 01/21/25, 02/04/25, 02/18/25, then monthly thereafter.  Due to patient's first blood glucose check being scheduled this upcoming Saturday. I have counseled patient's husband that if her FBG is > 160 that AM, to skip the dose and recheck FBG on Sunday, 12/07/24. If her blood sugar is < 160, then she can dose the Itovebi . If patient has sustained blood glucose readings > 160 over the weekend (especially anything greater than 200) patient's husband knows to call Dr. Demetra office on Monday AM.   Patient's husband is also aware that patient will need to start dexamethasone  mouthwash on 12/03/24 AM.   Patient's husband expressed understanding of the above plan.   Kristina Nicholson, PharmD, BCPS, BCOP Hematology/Oncology Clinical Pharmacist [phone] 12/02/2024 1:56 PM

## 2024-12-08 ENCOUNTER — Other Ambulatory Visit: Payer: Self-pay | Admitting: Hematology

## 2024-12-08 ENCOUNTER — Other Ambulatory Visit: Payer: Self-pay

## 2024-12-08 DIAGNOSIS — C50212 Malignant neoplasm of upper-inner quadrant of left female breast: Secondary | ICD-10-CM

## 2024-12-09 ENCOUNTER — Telehealth: Payer: Self-pay

## 2024-12-09 ENCOUNTER — Inpatient Hospital Stay: Admitting: Hematology

## 2024-12-09 ENCOUNTER — Inpatient Hospital Stay

## 2024-12-09 NOTE — Telephone Encounter (Signed)
 Patient did not arrive to her appointments today. Contacted patient via telephone call - spoke with patient's husband. Patient's husband requested appointments to be rescheduled.  Transferred cal to scheduler to reschedule, per spouse's request.

## 2024-12-09 NOTE — Assessment & Plan Note (Signed)
 [name], stage IIB, ER: Positive, PR: Negative, HER2: Negative, Grade 3, bone metastasis in 05/2023 -She was diagnosed in 04/2017. She is s/p left breast mastectomy and adjuvant radiation. Mammaprint showed low risk. -She started antiestrogen therapy with Tamoxifen  in 10/2017. She is tolerating well, plan for 10 year. She has not had a menstrual period since 2018.  Her FSH in March 2021 showed that she is still premenopausal --Due to worsening left shoulder pain, she underwent CT and bone scan in [place] during her trip. Unfortunately scan showed multiple bone lesion and uptake in sternum, cervical spine, left scapular, left 10th rib, and left hip, concerning for bone mets. She underwent bone biopsy of left scapula and sternum, both confirmed metastatic breast cancer, ER positive (weak to moderate in left scapula biopsy, and 80% strong positive in sternal biopsy), PR weak to moderate positive, HER2 2+ in left scapular, 1+ in sternal bone biopsy -PET scan from June 21, 2023 showed multifocal hypermetabolic osseous metastatic disease. No definite pathologic fracture or epidural tumor identified.  No visceral metastasis.   -she started fulvestrant  injection and Verzenio  on August 13, 2023.  Due to poor tolerance to Verzenio , dose was reduced to 100 mg twice daily -She underwent BSO on August 24, 2023, surgical path was benign. -Due to severe right hip pain from bone metastasis, she underwent right hip replacement on September 30, 2023 -Restaging PET scan November 08, 2023 showed excellent response in bone metastasis.  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 -Due to her GI side effect, I changed Verzenio  to Kisqali 600mg  daily 3 weeks on and one week off in mid Feb 2025, and it was changed to Ibrance  in mid March due to poor tolerance. She tolerates Ibrance  much better  -PET 05/26/24 showed bone met progression in left scapular, other bone mets are  well controlled. She underwent RT to left scapula  -PET 10/20/2024 unfortunately showed disease progression in her bones.  Guardant360 showed PIK3CA mutation positive. -Treatment changed to inavolisib , palbociclib  and fulvestrant  injection on 11/10/2024, Itovebi  caused hyperglycemia and it was held until 12/05/2023 after her prednisone  is tapered off

## 2024-12-11 ENCOUNTER — Other Ambulatory Visit: Payer: Self-pay

## 2024-12-12 NOTE — Assessment & Plan Note (Signed)
 [name], stage IIB, ER: Positive, PR: Negative, HER2: Negative, Grade 3, bone metastasis in 05/2023 -She was diagnosed in 04/2017. She is s/p left breast mastectomy and adjuvant radiation. Mammaprint showed low risk. -She started antiestrogen therapy with Tamoxifen  in 10/2017. She is tolerating well, plan for 10 year. She has not had a menstrual period since 2018.  Her FSH in March 2021 showed that she is still premenopausal --Due to worsening left shoulder pain, she underwent CT and bone scan in [place] during her trip. Unfortunately scan showed multiple bone lesion and uptake in sternum, cervical spine, left scapular, left 10th rib, and left hip, concerning for bone mets. She underwent bone biopsy of left scapula and sternum, both confirmed metastatic breast cancer, ER positive (weak to moderate in left scapula biopsy, and 80% strong positive in sternal biopsy), PR weak to moderate positive, HER2 2+ in left scapular, 1+ in sternal bone biopsy -PET scan from June 21, 2023 showed multifocal hypermetabolic osseous metastatic disease. No definite pathologic fracture or epidural tumor identified.  No visceral metastasis.   -she started fulvestrant  injection and Verzenio  on August 13, 2023.  Due to poor tolerance to Verzenio , dose was reduced to 100 mg twice daily -She underwent BSO on August 24, 2023, surgical path was benign. -Due to severe right hip pain from bone metastasis, she underwent right hip replacement on September 30, 2023 -Restaging PET scan November 08, 2023 showed excellent response in bone metastasis.  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 -Due to her GI side effect, I changed Verzenio  to Kisqali 600mg  daily 3 weeks on and one week off in mid Feb 2025, and it was changed to Ibrance  in mid March due to poor tolerance. She tolerates Ibrance  much better  -PET 05/26/24 showed bone met progression in left scapular, other bone mets are  well controlled. She underwent RT to left scapula  -PET 10/20/2024 unfortunately showed disease progression in her bones.  Guardant360 showed PIK3CA mutation positive. -Treatment changed to inavolisib , palbociclib  and fulvestrant  injection on 11/10/2024, Itovebi  caused hyperglycemia and it was held until 12/05/2023 after her prednisone  is tapered off

## 2024-12-15 ENCOUNTER — Other Ambulatory Visit: Payer: Self-pay

## 2024-12-16 ENCOUNTER — Other Ambulatory Visit: Payer: Self-pay

## 2024-12-16 ENCOUNTER — Inpatient Hospital Stay

## 2024-12-16 ENCOUNTER — Other Ambulatory Visit (HOSPITAL_COMMUNITY): Payer: Self-pay

## 2024-12-16 ENCOUNTER — Inpatient Hospital Stay: Attending: Hematology

## 2024-12-16 ENCOUNTER — Inpatient Hospital Stay: Admitting: Hematology

## 2024-12-16 VITALS — BP 122/63 | HR 60 | Temp 97.6°F | Resp 17 | Wt 130.9 lb

## 2024-12-16 DIAGNOSIS — Z170 Estrogen receptor positive status [ER+]: Secondary | ICD-10-CM | POA: Diagnosis not present

## 2024-12-16 DIAGNOSIS — Z131 Encounter for screening for diabetes mellitus: Secondary | ICD-10-CM | POA: Diagnosis not present

## 2024-12-16 DIAGNOSIS — C50212 Malignant neoplasm of upper-inner quadrant of left female breast: Secondary | ICD-10-CM

## 2024-12-16 LAB — CBC WITH DIFFERENTIAL/PLATELET
Abs Immature Granulocytes: 0.01 10*3/uL (ref 0.00–0.07)
Basophils Absolute: 0.1 10*3/uL (ref 0.0–0.1)
Basophils Relative: 2 %
Eosinophils Absolute: 0 10*3/uL (ref 0.0–0.5)
Eosinophils Relative: 1 %
HCT: 36 % (ref 36.0–46.0)
Hemoglobin: 12.2 g/dL (ref 12.0–15.0)
Immature Granulocytes: 1 %
Lymphocytes Relative: 45 %
Lymphs Abs: 0.9 10*3/uL (ref 0.7–4.0)
MCH: 33.2 pg (ref 26.0–34.0)
MCHC: 33.9 g/dL (ref 30.0–36.0)
MCV: 97.8 fL (ref 80.0–100.0)
Monocytes Absolute: 0.1 10*3/uL (ref 0.1–1.0)
Monocytes Relative: 6 %
Neutro Abs: 0.9 10*3/uL — ABNORMAL LOW (ref 1.7–7.7)
Neutrophils Relative %: 45 %
Platelets: 270 10*3/uL (ref 150–400)
RBC: 3.68 MIL/uL — ABNORMAL LOW (ref 3.87–5.11)
RDW: 15.5 % (ref 11.5–15.5)
WBC: 2.1 10*3/uL — ABNORMAL LOW (ref 4.0–10.5)
nRBC: 0 % (ref 0.0–0.2)

## 2024-12-16 LAB — COMPREHENSIVE METABOLIC PANEL WITH GFR
ALT: 9 U/L (ref 0–44)
AST: 16 U/L (ref 15–41)
Albumin: 4.2 g/dL (ref 3.5–5.0)
Alkaline Phosphatase: 63 U/L (ref 38–126)
Anion gap: 7 (ref 5–15)
BUN: 13 mg/dL (ref 8–23)
CO2: 29 mmol/L (ref 22–32)
Calcium: 8.9 mg/dL (ref 8.9–10.3)
Chloride: 105 mmol/L (ref 98–111)
Creatinine, Ser: 0.81 mg/dL (ref 0.44–1.00)
GFR, Estimated: >60 mL/min
Glucose, Bld: 173 mg/dL — ABNORMAL HIGH (ref 70–99)
Potassium: 3.9 mmol/L (ref 3.5–5.1)
Sodium: 140 mmol/L (ref 135–145)
Total Bilirubin: 0.3 mg/dL (ref 0.0–1.2)
Total Protein: 6.9 g/dL (ref 6.5–8.1)

## 2024-12-16 MED ORDER — FULVESTRANT 250 MG/5ML IM SOSY
500.0000 mg | PREFILLED_SYRINGE | Freq: Once | INTRAMUSCULAR | Status: AC
  Administered 2024-12-16: 500 mg via INTRAMUSCULAR

## 2024-12-16 MED ORDER — INAVOLISIB 9 MG PO TABS
9.0000 mg | ORAL_TABLET | Freq: Every day | ORAL | 2 refills | Status: AC
  Filled 2024-12-16 – 2024-12-18 (×3): qty 28, 28d supply, fill #0

## 2024-12-17 ENCOUNTER — Encounter: Payer: Self-pay | Admitting: Hematology

## 2024-12-17 ENCOUNTER — Other Ambulatory Visit: Payer: Self-pay

## 2024-12-17 ENCOUNTER — Other Ambulatory Visit (HOSPITAL_COMMUNITY): Payer: Self-pay

## 2024-12-17 ENCOUNTER — Telehealth: Payer: Self-pay

## 2024-12-17 ENCOUNTER — Other Ambulatory Visit: Payer: Self-pay | Admitting: Pharmacy Technician

## 2024-12-17 NOTE — Telephone Encounter (Signed)
 Oral Oncology Patient Advocate Encounter   Was successful in obtaining a copay card for Itovebi .  This copay card will make the patients copay $0.00.    The billing information is as follows and has been shared with [facility].   RxBin: [id number] PCN: [id number] Member ID: [id number] Group ID: [id number]    Charlott Hamilton,  CPhT-Adv  she/her/hers [facility]  [facility] Pharmacy Technician Patient Advocate Specialist III [facility] Phone: [phone]  Fax: [phone] [email or web address]

## 2024-12-18 ENCOUNTER — Other Ambulatory Visit: Payer: Self-pay

## 2024-12-18 ENCOUNTER — Other Ambulatory Visit (HOSPITAL_COMMUNITY): Payer: Self-pay

## 2024-12-18 ENCOUNTER — Encounter (INDEPENDENT_AMBULATORY_CARE_PROVIDER_SITE_OTHER): Payer: Self-pay

## 2024-12-18 NOTE — Progress Notes (Signed)
 Specialty Pharmacy Refill Coordination Note  Kristina Nicholson is a 59 y.o. female contacted today regarding refills of specialty medication(s) Inavolisib  (ITOVEBI )   Patient requested [name] at [facility] at [place] date: 12/22/24   Medication will be filled on: 12/19/24  Notified patient husband that medication is on order and will likely not be available until 2.9.26 at the earliest.

## 2024-12-19 ENCOUNTER — Other Ambulatory Visit: Payer: Self-pay

## 2025-01-12 ENCOUNTER — Inpatient Hospital Stay: Attending: Hematology

## 2025-01-12 ENCOUNTER — Inpatient Hospital Stay: Admitting: Hematology

## 2025-01-12 ENCOUNTER — Inpatient Hospital Stay
